# Patient Record
Sex: Female | Born: 1955 | Race: White | Hispanic: No | Marital: Married | Smoking: Former smoker
Health system: Southern US, Community
[De-identification: ages and names within clinical notes are randomized; demographics above are authoritative.]

## PROBLEM LIST (undated history)

## (undated) DIAGNOSIS — J45909 Unspecified asthma, uncomplicated: Secondary | ICD-10-CM

## (undated) DIAGNOSIS — I509 Heart failure, unspecified: Secondary | ICD-10-CM

## (undated) DIAGNOSIS — M549 Dorsalgia, unspecified: Secondary | ICD-10-CM

## (undated) DIAGNOSIS — J449 Chronic obstructive pulmonary disease, unspecified: Secondary | ICD-10-CM

## (undated) DIAGNOSIS — G8929 Other chronic pain: Secondary | ICD-10-CM

## (undated) DIAGNOSIS — I272 Pulmonary hypertension, unspecified: Secondary | ICD-10-CM

## (undated) HISTORY — PX: BACK SURGERY: SHX140

---

## 2000-04-26 ENCOUNTER — Ambulatory Visit: Admission: RE | Admit: 2000-04-26 | Discharge: 2000-04-26 | Payer: Self-pay | Admitting: Internal Medicine

## 2000-06-20 ENCOUNTER — Encounter: Payer: Self-pay | Admitting: Pulmonary Disease

## 2000-06-20 ENCOUNTER — Ambulatory Visit (HOSPITAL_COMMUNITY): Admission: RE | Admit: 2000-06-20 | Discharge: 2000-06-20 | Payer: Self-pay | Admitting: Pulmonary Disease

## 2000-06-22 ENCOUNTER — Ambulatory Visit (HOSPITAL_COMMUNITY): Admission: RE | Admit: 2000-06-22 | Discharge: 2000-06-22 | Payer: Self-pay | Admitting: Critical Care Medicine

## 2000-06-22 ENCOUNTER — Encounter: Payer: Self-pay | Admitting: Critical Care Medicine

## 2000-07-23 ENCOUNTER — Encounter: Payer: Self-pay | Admitting: Emergency Medicine

## 2000-07-23 ENCOUNTER — Emergency Department (HOSPITAL_COMMUNITY): Admission: EM | Admit: 2000-07-23 | Discharge: 2000-07-23 | Payer: Self-pay | Admitting: Emergency Medicine

## 2002-07-18 ENCOUNTER — Encounter (HOSPITAL_BASED_OUTPATIENT_CLINIC_OR_DEPARTMENT_OTHER): Admission: RE | Admit: 2002-07-18 | Discharge: 2002-10-23 | Payer: Self-pay | Admitting: Internal Medicine

## 2002-09-20 ENCOUNTER — Encounter (HOSPITAL_BASED_OUTPATIENT_CLINIC_OR_DEPARTMENT_OTHER): Admission: RE | Admit: 2002-09-20 | Discharge: 2002-12-19 | Payer: Self-pay | Admitting: Internal Medicine

## 2002-12-25 ENCOUNTER — Encounter (HOSPITAL_BASED_OUTPATIENT_CLINIC_OR_DEPARTMENT_OTHER): Admission: RE | Admit: 2002-12-25 | Discharge: 2003-03-25 | Payer: Self-pay | Admitting: Internal Medicine

## 2004-06-11 ENCOUNTER — Encounter: Admission: RE | Admit: 2004-06-11 | Discharge: 2004-06-11 | Payer: Self-pay | Admitting: Unknown Physician Specialty

## 2004-08-20 ENCOUNTER — Encounter: Admission: RE | Admit: 2004-08-20 | Discharge: 2004-08-20 | Payer: Self-pay | Admitting: Unknown Physician Specialty

## 2004-08-27 ENCOUNTER — Encounter: Admission: RE | Admit: 2004-08-27 | Discharge: 2004-08-27 | Payer: Self-pay | Admitting: Unknown Physician Specialty

## 2004-09-10 ENCOUNTER — Encounter: Admission: RE | Admit: 2004-09-10 | Discharge: 2004-09-10 | Payer: Self-pay | Admitting: Unknown Physician Specialty

## 2004-09-17 ENCOUNTER — Encounter: Admission: RE | Admit: 2004-09-17 | Discharge: 2004-09-17 | Payer: Self-pay | Admitting: Unknown Physician Specialty

## 2004-10-08 ENCOUNTER — Encounter: Admission: RE | Admit: 2004-10-08 | Discharge: 2004-10-08 | Payer: Self-pay | Admitting: Unknown Physician Specialty

## 2004-12-22 ENCOUNTER — Encounter: Admission: RE | Admit: 2004-12-22 | Discharge: 2004-12-22 | Payer: Self-pay | Admitting: Unknown Physician Specialty

## 2005-02-09 ENCOUNTER — Encounter: Admission: RE | Admit: 2005-02-09 | Discharge: 2005-02-09 | Payer: Self-pay | Admitting: Unknown Physician Specialty

## 2005-02-18 ENCOUNTER — Encounter: Admission: RE | Admit: 2005-02-18 | Discharge: 2005-02-18 | Payer: Self-pay | Admitting: Unknown Physician Specialty

## 2005-03-18 ENCOUNTER — Encounter: Admission: RE | Admit: 2005-03-18 | Discharge: 2005-03-18 | Payer: Self-pay | Admitting: Unknown Physician Specialty

## 2005-03-25 ENCOUNTER — Encounter: Admission: RE | Admit: 2005-03-25 | Discharge: 2005-03-25 | Payer: Self-pay | Admitting: Unknown Physician Specialty

## 2005-04-15 ENCOUNTER — Encounter: Admission: RE | Admit: 2005-04-15 | Discharge: 2005-04-15 | Payer: Self-pay | Admitting: Interventional Radiology

## 2005-09-16 ENCOUNTER — Encounter: Admission: RE | Admit: 2005-09-16 | Discharge: 2005-09-16 | Payer: Self-pay | Admitting: Unknown Physician Specialty

## 2006-09-01 IMAGING — US EM EST PATIENT OFFICE LEVEL 3 (15 MIN)
1 series · 13 of 16 positions shown · non-contrast
Comparison: none

CLINICAL DATA: One week follow-up transcatheter occlusion of the right great saphenous vein.
 ULTRASOUND ESTABLISHED
 The patient returns today one week following endovenous laser ablation of the right great saphenous vein.  The patient is doing well.  The patient describes only minimal discomfort over the weekend, but that has resolved at this point.  
 On physical exam, there is slight bruising noted in the medial right thigh particularly inferiorly.  
 On ultrasound, the treated right great saphenous vein from near the saphenofemoral junction to the lower thigh is occluded.  The duplicated or anterolateral saphenous vein which arose very proximal from the main right great saphenous vein remains patent.  The main right great saphenous vein is occluded below the inflow from this duplicated saphenous just at the saphenofemoral junction.  
 The deep venous system is widely patent without evidence of DVT.  
 IMPRESSION
 1.  One week follow-up ultrasound visit following transcatheter occlusion of the right great saphenous vein shows that the treated right great saphenous vein is occluded from just below the inflow of the duplicated right great saphenous vein down into the distal thigh at the access site.  No evidence of DVT.  
 2.  We will see the patient back in two weeks and plan on treating the duplicated or anterolateral saphenous vein at that time.

[Series 1: unknown · 13 of 23 slices shown]
[im 1/23]
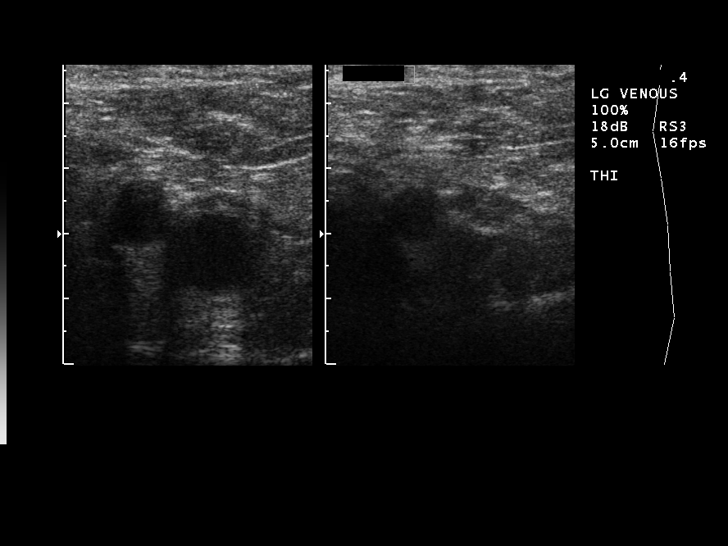
[im 2/23]
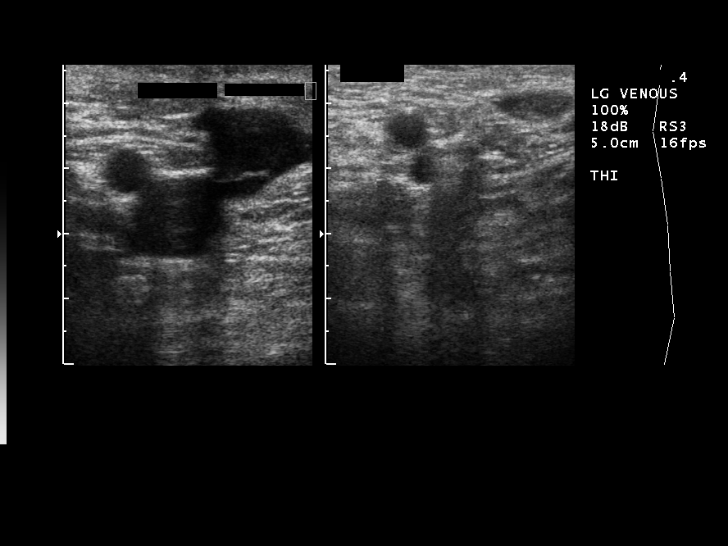
[im 5/23]
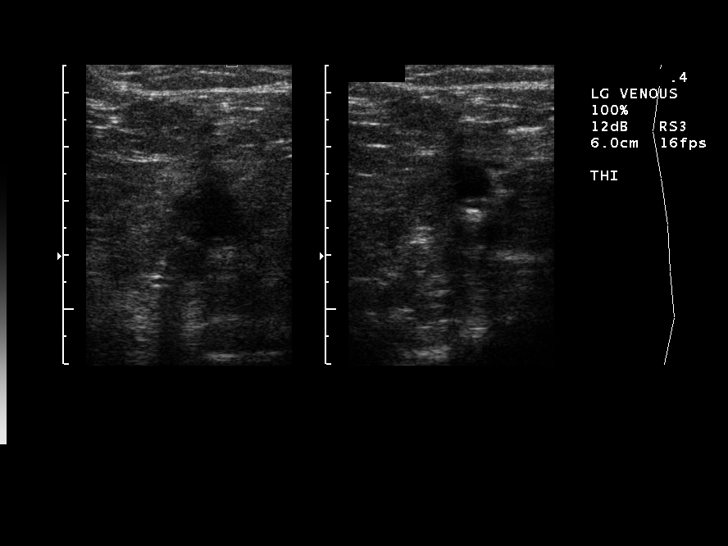
[im 6/23]
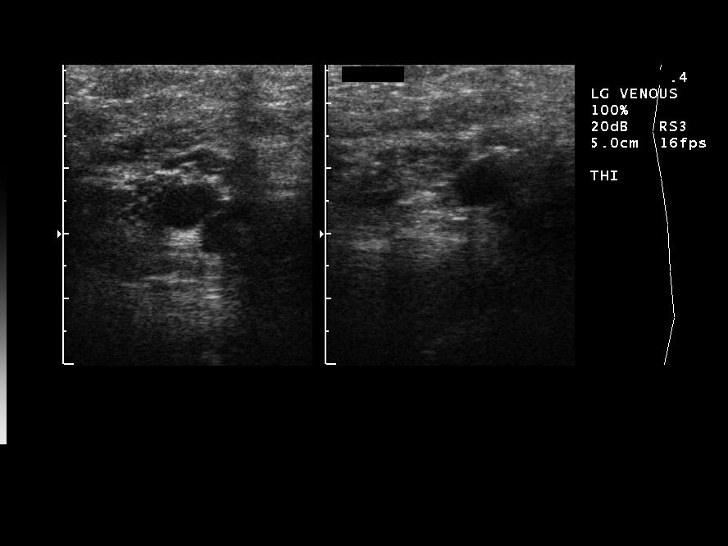
[im 8/23]
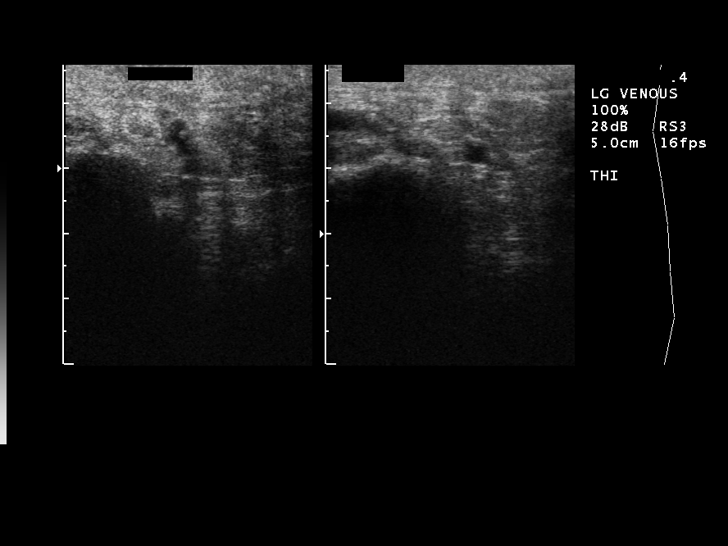
[im 9/23]
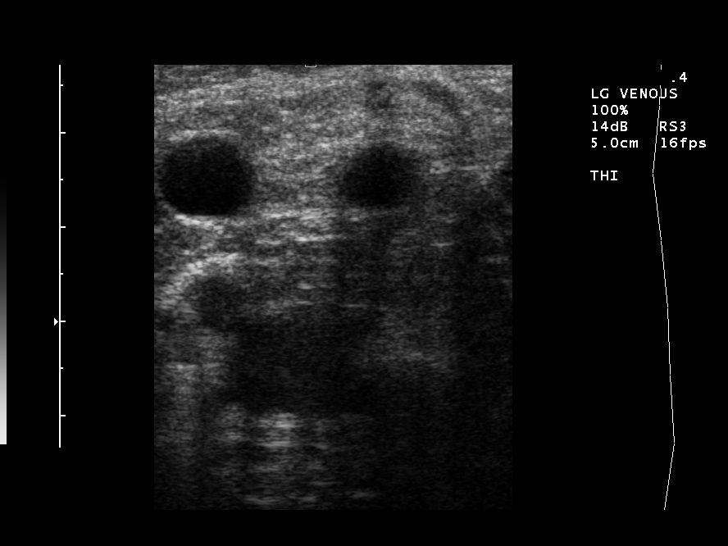
[im 12/23]
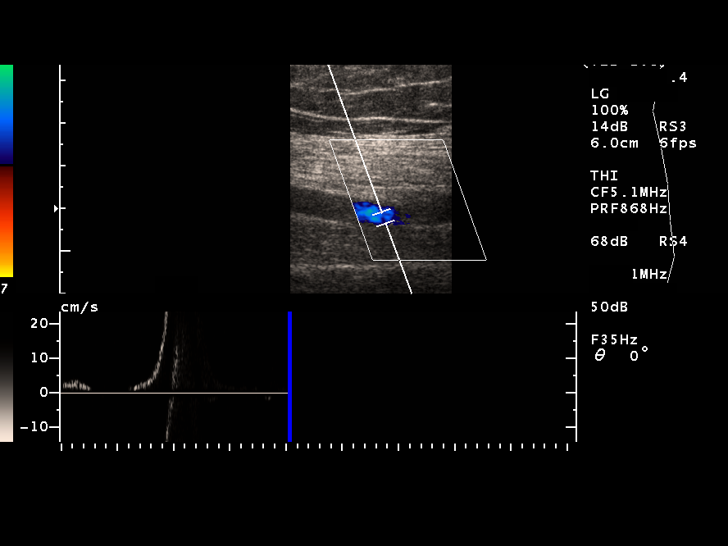
[im 14/23]
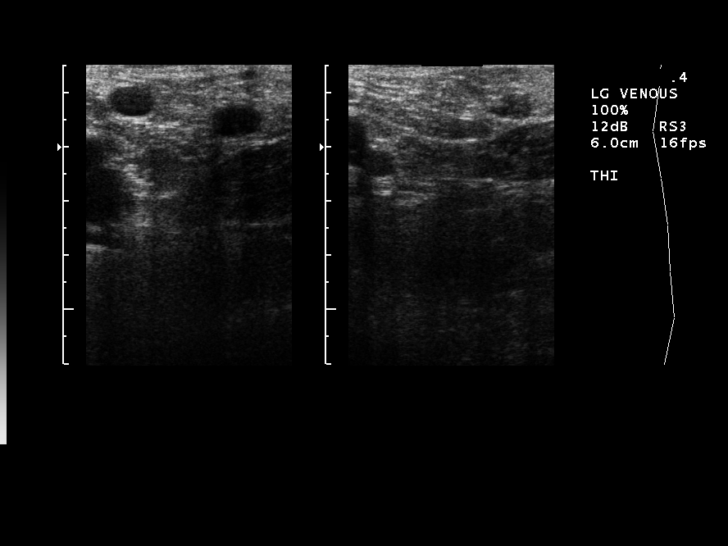
[im 15/23]
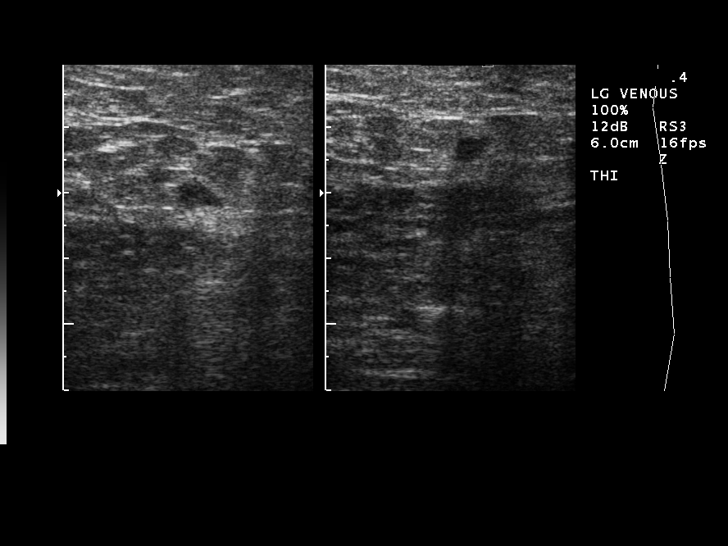
[im 17/23]
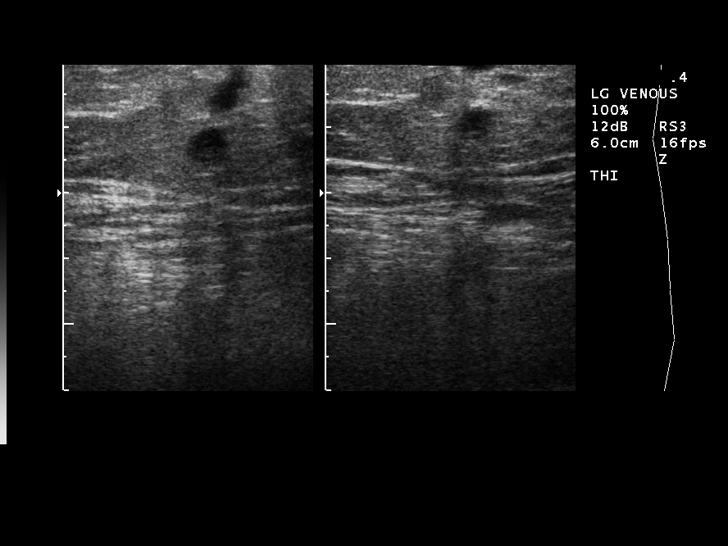
[im 18/23]
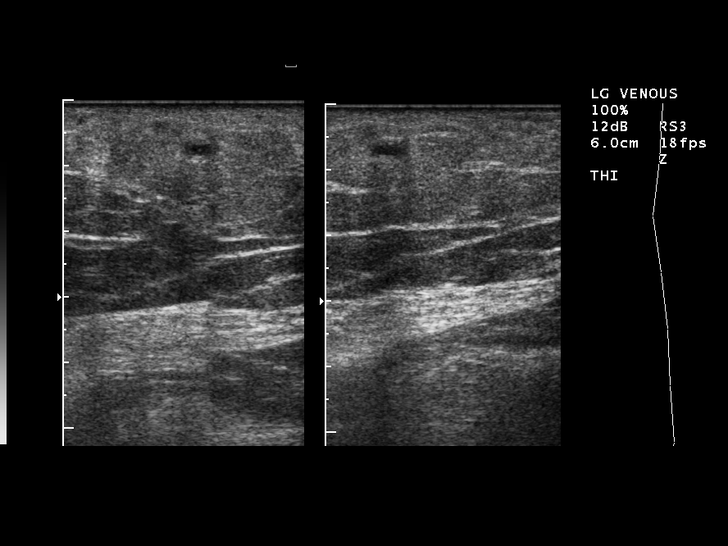
[im 21/23]
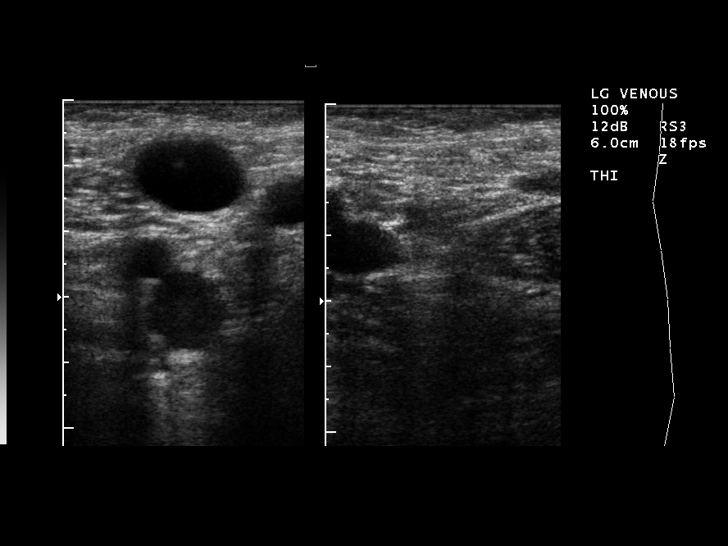
[im 23/23]
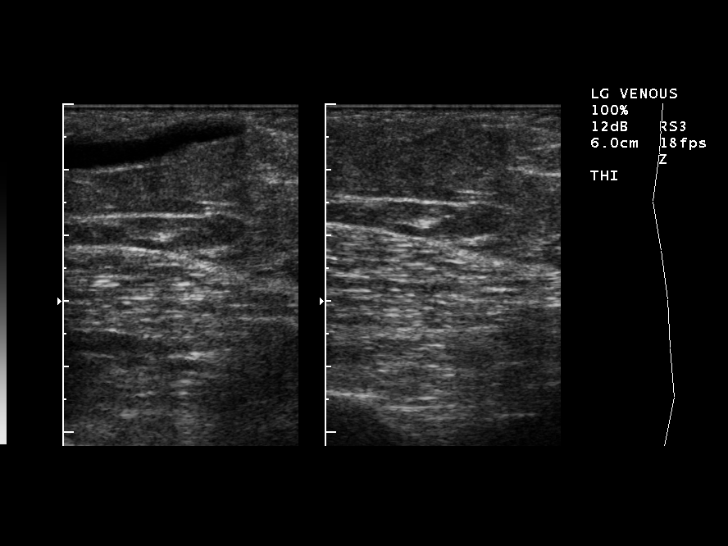

[13 of 16 positions shown; findings below may reference images not displayed]

## 2006-10-23 ENCOUNTER — Emergency Department: Payer: Self-pay | Admitting: Emergency Medicine

## 2006-10-23 ENCOUNTER — Other Ambulatory Visit: Payer: Self-pay

## 2007-02-23 IMAGING — US EM EST PATIENT OFFICE LEVEL 3 (15 MIN)
1 series · 13 of 16 positions shown · non-contrast
Comparison: none

CLINICAL DATA: Varicose veins.
ULTRASOUND ESTABLISHED PATIENT OFFICE VISIT ? LEVEL III ? [id number]:
Today, Ms. Tebohotebele returns for a one week follow-up exam after left great saphenous transcatheter laser occlusion on 02/09/05.   Today, she reports some mild redness and tenderness along the proximal medial calf which overlies the treated segment. 
On exam, this area demonstrates an oblique area of redness and tenderness overlying the treated segment consistent with thrombophlebitis.  The area has improved over the last few days and has not progressed or worsened. 
On ultrasound, the left common femoral, femoral and popliteal veins demonstrate normal compressibility and augmentation without DVT.  The left great saphenous vein remains patent proximally along a segment of about 8 to 10 cm from the saphenofemoral junction extending inferiorly.  In the mid to upper thigh level, there is a varicose tributary branch extending anterolaterally across the thigh and knee.  This pathway remains patent.  Below the tributary junction, the saphenous vein is echogenic and noncompressible with complete thrombosis related to the treatment which extends through the thigh across the knee and into the upper calf region.  At the site of mild thrombophlebitis, the overlying skin is intact.  Mild pinkness to the skin persists in the anterior pretibial region which is a chronic finding with mild induration.  There is also eczema about the ankle.  All of these changes are related to chronic venous disease and are unchanged.

[Series 1: unknown · 13 of 23 slices shown]
[im 1/23]
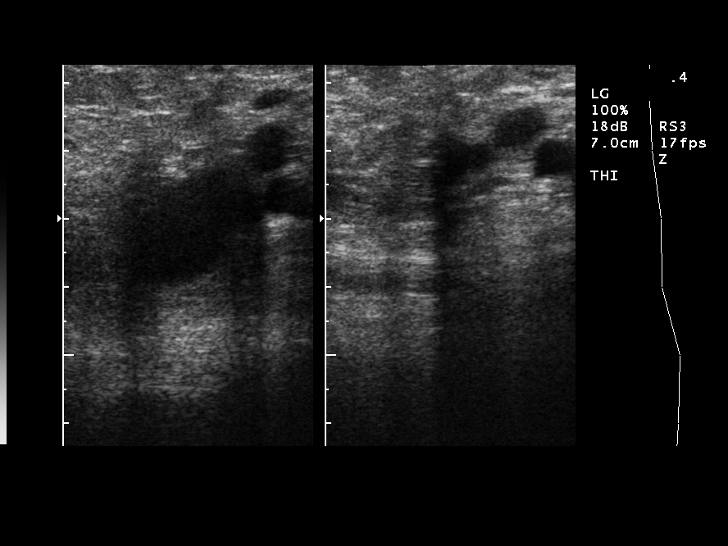
[im 2/23]
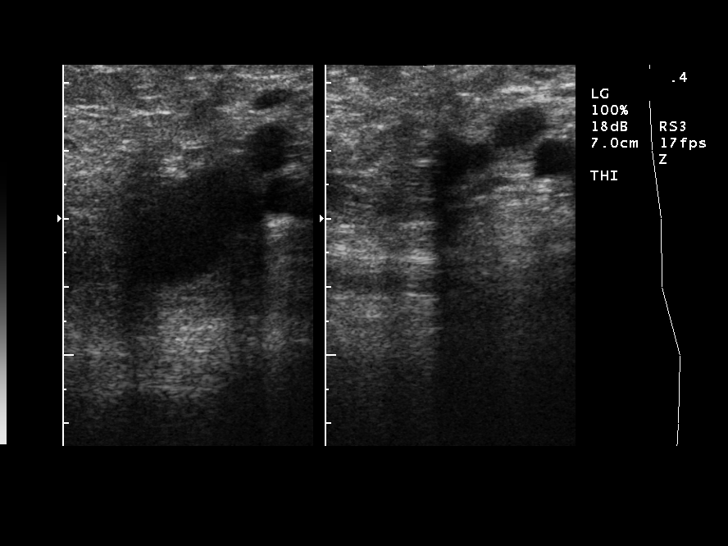
[im 5/23]
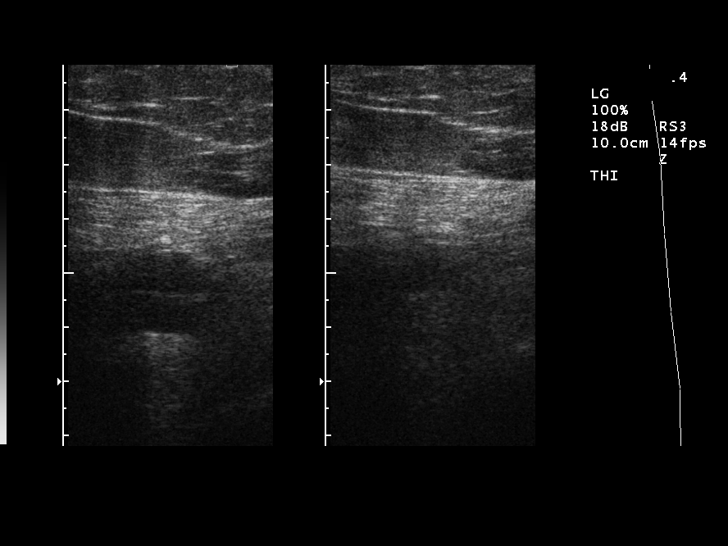
[im 6/23]
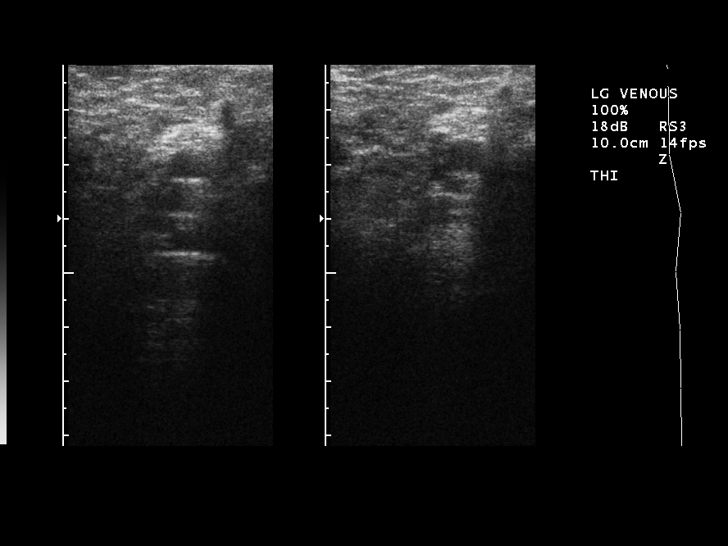
[im 8/23]
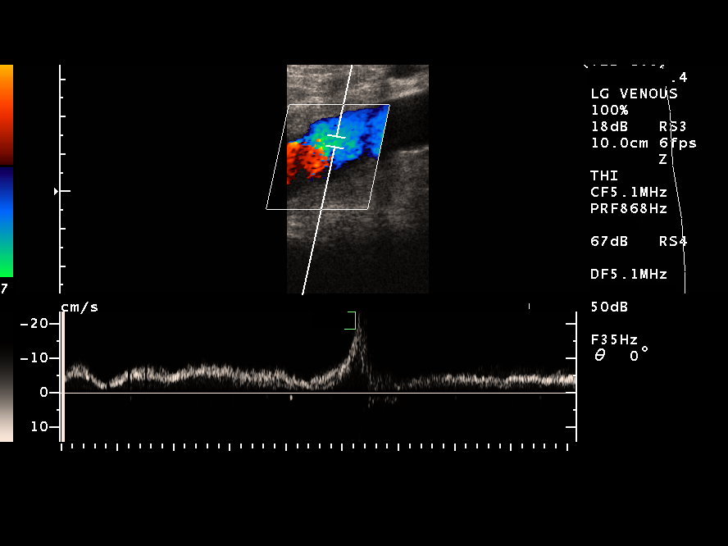
[im 9/23]
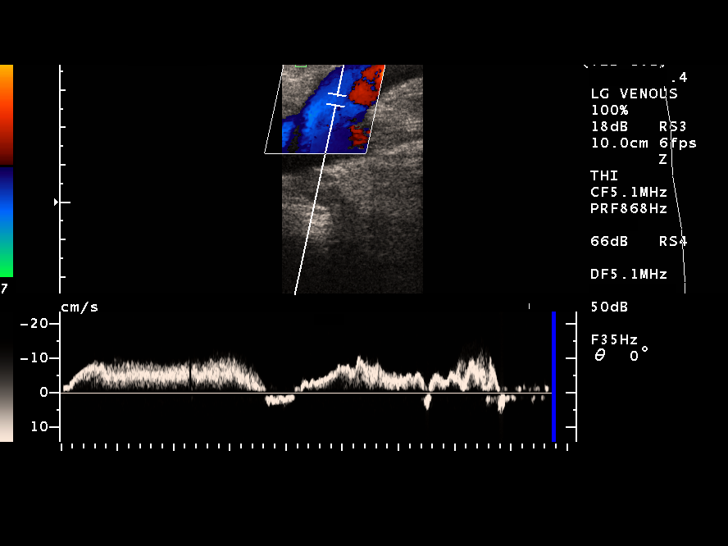
[im 12/23]
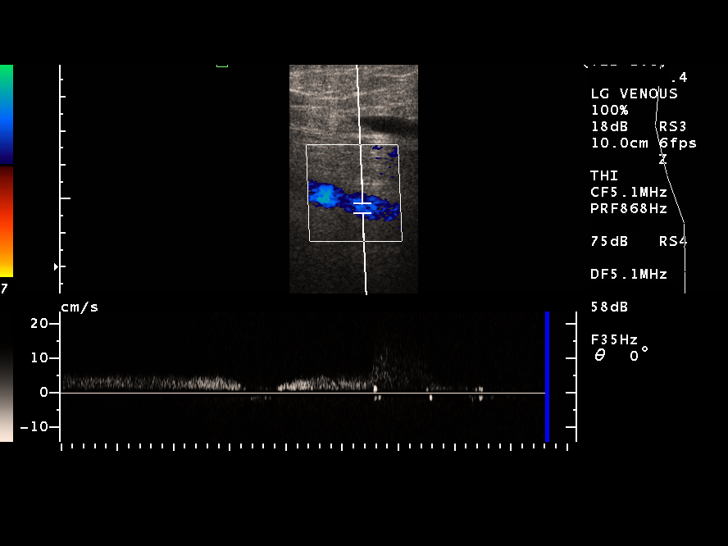
[im 14/23]
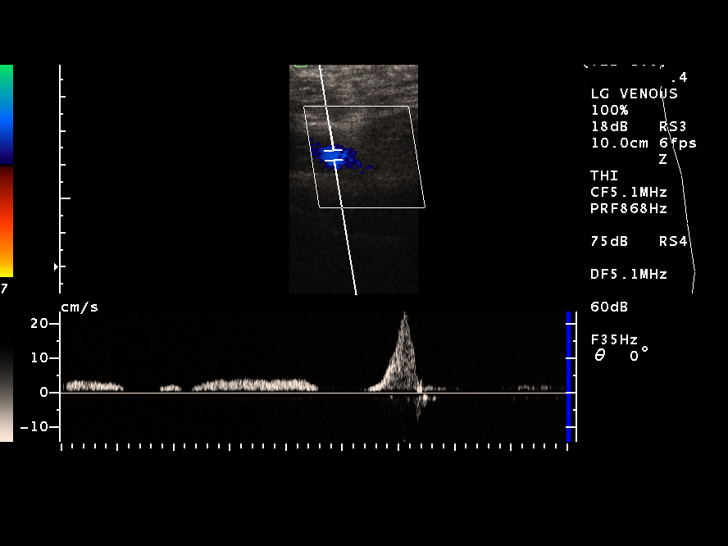
[im 15/23]
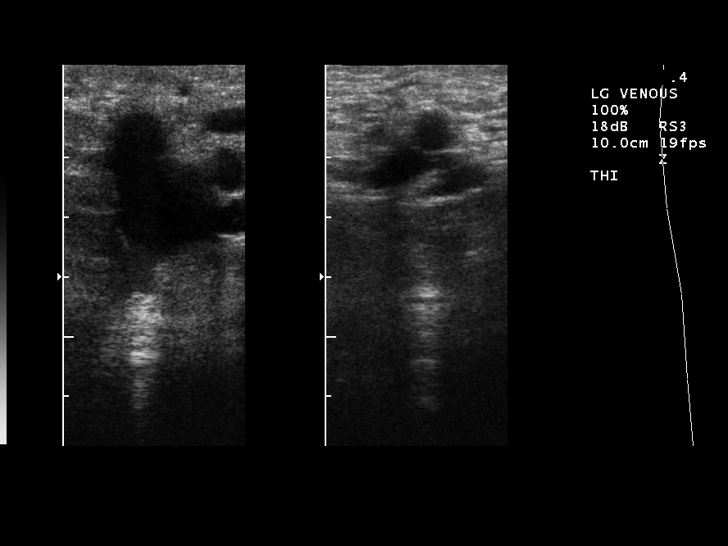
[im 17/23]
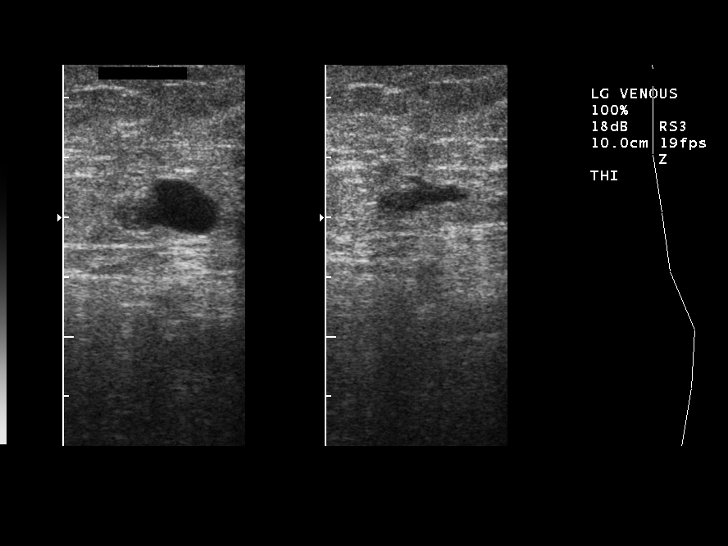
[im 18/23]
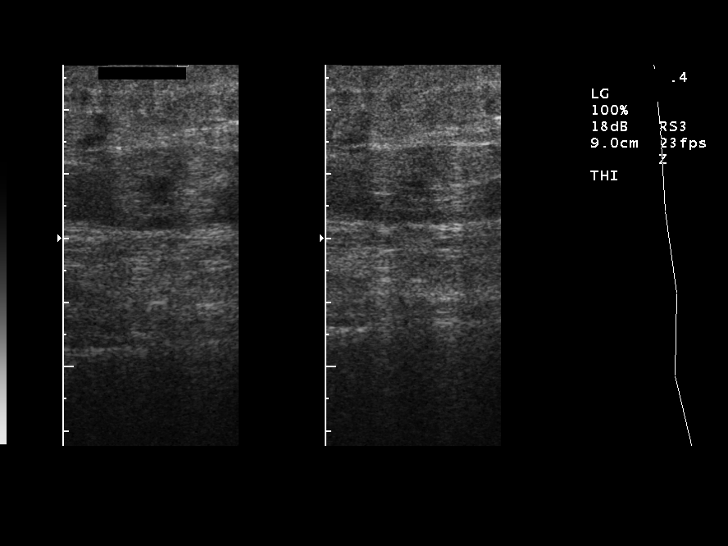
[im 21/23]
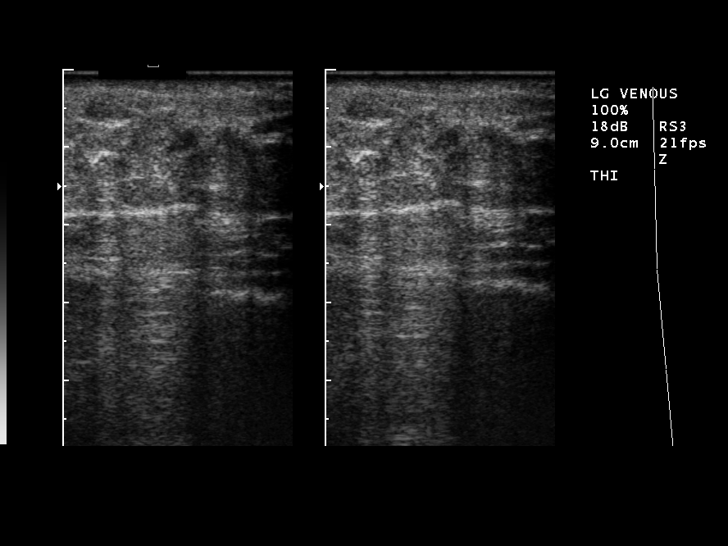
[im 23/23]
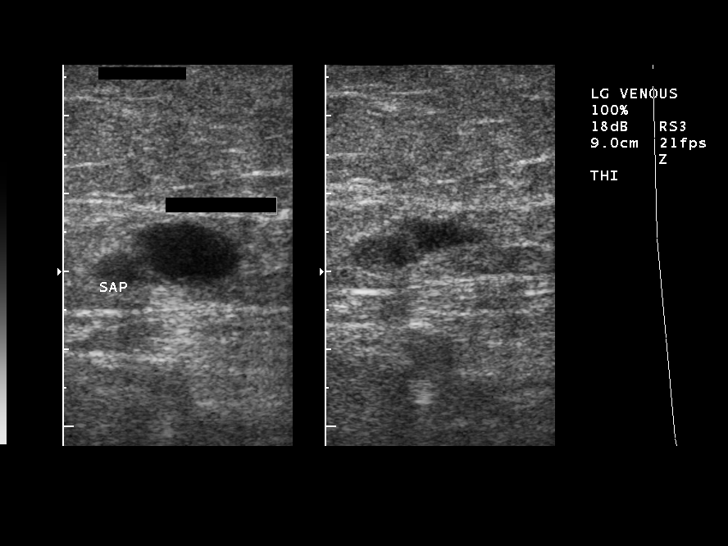

[13 of 16 positions shown; findings below may reference images not displayed]

IMPRESSION: 1.  Status post left great saphenous transcatheter laser occlusion with occlusion of the saphenous vein from the upper to mid thigh level into the calf region.  A short segment proximally from the saphenofemoral junction to the upper thigh remains patent which supplies an anterolateral tributary varicosity.  This pathway will require a second endovenous laser treatment at the one month follow-up visit.  At which time, additional injection foam sclerotherapy will be performed. 
2.  Improving thrombophlebitis along the calf region. 
3.  Stable chronic venous changes of the lower calf and ankle.

## 2008-07-12 ENCOUNTER — Ambulatory Visit: Payer: Self-pay | Admitting: Unknown Physician Specialty

## 2008-07-18 ENCOUNTER — Ambulatory Visit: Payer: Self-pay | Admitting: Unknown Physician Specialty

## 2009-01-22 ENCOUNTER — Encounter: Payer: Self-pay | Admitting: Internal Medicine

## 2009-02-06 ENCOUNTER — Encounter: Payer: Self-pay | Admitting: Internal Medicine

## 2010-09-21 DIAGNOSIS — I878 Other specified disorders of veins: Secondary | ICD-10-CM | POA: Insufficient documentation

## 2010-09-21 DIAGNOSIS — G8929 Other chronic pain: Secondary | ICD-10-CM | POA: Insufficient documentation

## 2010-09-21 DIAGNOSIS — I10 Essential (primary) hypertension: Secondary | ICD-10-CM | POA: Insufficient documentation

## 2010-11-28 ENCOUNTER — Encounter: Payer: Self-pay | Admitting: Unknown Physician Specialty

## 2010-12-12 ENCOUNTER — Ambulatory Visit: Payer: Self-pay | Admitting: Family Medicine

## 2011-03-26 NOTE — Consult Note (Signed)
NAME:  Regina White, Regina White                      ACCOUNT NO.:  [id number]   MEDICAL RECORD NO.:  [id number]                   PATIENT TYPE:  REC   LOCATION:  FOOT                                 FACILITY:  [facility]   PHYSICIAN:  Jonelle Sports. Sevier, MD                DATE OF BIRTH:  1956-08-14   DATE OF CONSULTATION:  07/19/2002  DATE OF DISCHARGE:                                   CONSULTATION   HISTORY:  This 55 year old white female is referred by Dr. Katrinka Blazing for  assistance with management of a chronic ulcer on the right lower extremity.   The patient has a complex medical history centered around complex back  problems which have resulted in a total of 14 different surgeries and she  now, in fact, uses a morphine pump for chronic unrelieved back pain.  She  has a tendency toward edema presumed secondary to varicose veins, but she  says in some way seemingly exaggerated by her use of the morphine pump.  She  has had recurrent ulcers of the lower extremities for some six years, but  none recently until approximately nine months ago when she developed a large  nonhealing ulcer on the medial aspect of the right ankle.  She initially had  used an Radio broadcast assistant which caused extreme compression and bruising and required  its discontinuation.  She has been using Silvadene cream and an Ace wrap on  this ulcer recently without relief.   She is here now for our further evaluation and advice.   PAST MEDICAL HISTORY:  Really no medical problems other than those  previously mentioned.   ALLERGIES:  PENICILLIN, ERYTHROMYCIN, AMPICILLIN, and SULFA.   REGULAR MEDICATIONS:  Nexium, accolade, Demadex, morphine, Klor-Con, Zyrtec,  Cytomel, metoclopramide, Maxair, Advair inhalers, currently Avelox for sinus  infection.   PHYSICAL EXAMINATION:  EXTREMITIES:  Examination today is limited to the  distal lower extremities.  The feet are mild to moderately edematous, the  right slightly more so than the left.   Skin temperatures are normal, but are  several degrees higher in the left lower extremity in comparison to the  right (the patient attributes this to previous sympathectomy).  All pulses  are essentially palpable and appear adequate.  Monofilament testing shows  some loss of sensation over the metatarsal heads, but otherwise persistence  of protective sensation.  There are no open lesions or significant calluses  on the feet themselves.  In the right supramalleolar area on the medial  aspect of that extremity is a punched out open ulceration measuring 25 x 15  mm with shaggy base, crusty margins, and approximately 2 mm in depth.  There  is surrounding erythema which looks more inflammatory than infectious in  nature involving most of that ankle and the distal third of the lower leg.   IMPRESSION:  Chronic venous stasis ulceration.   DISPOSITION:  1. The matter is discussed with the  patient in some detail in an effort to     get her to tolerate compressive dressings again.  2. The wound today is treated with Accuzyme ointment and 0.1% triamcinolone     cream is applied to the adjacent inflamed periwound areas.  The area is     then covered with a light Profour dressing using only three of the four     Profour wraps.  3.     The patient will be scheduled for pulse lavage tomorrow (Friday) as well as      next Monday and Wednesday with replacement of Accuzyme and similar     dressing after each lavage treatment.  4. A follow-up visit will be to this clinic next Wednesday, six days hence.                                               Jonelle Sports. Cheryll Cockayne, MD    RES/MEDQ  D:  07/19/2002  T:  07/19/2002  Job:  [id number]   cc:   Roland Earl

## 2012-08-14 ENCOUNTER — Ambulatory Visit: Payer: Self-pay | Admitting: Internal Medicine

## 2013-06-14 DIAGNOSIS — I272 Pulmonary hypertension, unspecified: Secondary | ICD-10-CM | POA: Insufficient documentation

## 2013-06-14 DIAGNOSIS — I82409 Acute embolism and thrombosis of unspecified deep veins of unspecified lower extremity: Secondary | ICD-10-CM | POA: Insufficient documentation

## 2013-06-18 DIAGNOSIS — R590 Localized enlarged lymph nodes: Secondary | ICD-10-CM | POA: Insufficient documentation

## 2013-06-18 DIAGNOSIS — L539 Erythematous condition, unspecified: Secondary | ICD-10-CM | POA: Insufficient documentation

## 2013-06-18 DIAGNOSIS — M79605 Pain in left leg: Secondary | ICD-10-CM | POA: Insufficient documentation

## 2013-06-25 DIAGNOSIS — I82409 Acute embolism and thrombosis of unspecified deep veins of unspecified lower extremity: Secondary | ICD-10-CM | POA: Insufficient documentation

## 2013-07-25 DIAGNOSIS — G4733 Obstructive sleep apnea (adult) (pediatric): Secondary | ICD-10-CM | POA: Insufficient documentation

## 2013-09-24 ENCOUNTER — Ambulatory Visit: Payer: Self-pay | Admitting: Podiatry

## 2013-09-25 DIAGNOSIS — M9979 Connective tissue and disc stenosis of intervertebral foramina of abdomen and other regions: Secondary | ICD-10-CM | POA: Insufficient documentation

## 2013-09-25 DIAGNOSIS — IMO0002 Reserved for concepts with insufficient information to code with codable children: Secondary | ICD-10-CM | POA: Insufficient documentation

## 2015-09-05 ENCOUNTER — Ambulatory Visit
Admission: RE | Admit: 2015-09-05 | Discharge: 2015-09-05 | Disposition: A | Discharge status: Home / Self Care | Payer: 59 | Source: Ambulatory Visit | Attending: Emergency Medicine | Admitting: Emergency Medicine

## 2015-09-05 ENCOUNTER — Other Ambulatory Visit: Payer: Self-pay | Admitting: Emergency Medicine

## 2015-09-05 DIAGNOSIS — M2548 Effusion, other site: Secondary | ICD-10-CM | POA: Insufficient documentation

## 2015-09-05 DIAGNOSIS — M79661 Pain in right lower leg: Secondary | ICD-10-CM

## 2015-09-05 DIAGNOSIS — M25469 Effusion, unspecified knee: Secondary | ICD-10-CM

## 2015-12-23 DIAGNOSIS — I5030 Unspecified diastolic (congestive) heart failure: Secondary | ICD-10-CM | POA: Insufficient documentation

## 2015-12-23 DIAGNOSIS — J449 Chronic obstructive pulmonary disease, unspecified: Secondary | ICD-10-CM | POA: Insufficient documentation

## 2016-02-17 DIAGNOSIS — R197 Diarrhea, unspecified: Secondary | ICD-10-CM | POA: Insufficient documentation

## 2016-02-17 DIAGNOSIS — H6501 Acute serous otitis media, right ear: Secondary | ICD-10-CM | POA: Insufficient documentation

## 2016-10-06 ENCOUNTER — Ambulatory Visit (INDEPENDENT_AMBULATORY_CARE_PROVIDER_SITE_OTHER): Payer: 59

## 2016-10-06 ENCOUNTER — Ambulatory Visit (INDEPENDENT_AMBULATORY_CARE_PROVIDER_SITE_OTHER): Payer: 59 | Admitting: Podiatry

## 2016-10-06 ENCOUNTER — Encounter: Payer: Self-pay | Admitting: Podiatry

## 2016-10-06 DIAGNOSIS — M71571 Other bursitis, not elsewhere classified, right ankle and foot: Secondary | ICD-10-CM

## 2016-10-06 DIAGNOSIS — M2041 Other hammer toe(s) (acquired), right foot: Secondary | ICD-10-CM | POA: Diagnosis not present

## 2016-10-06 DIAGNOSIS — M7751 Other enthesopathy of right foot: Secondary | ICD-10-CM

## 2016-10-06 NOTE — Progress Notes (Signed)
   Subjective:    Patient ID: Regina White, female    DOB: 1955/12/03, 60 y.o.   MRN: [id number]  HPI: She presents today with chief complaint of painful lesion lateral aspect fifth metatarsal base right foot. States that she purchased a new para shoes which has resulted in this ulceration and sore.  Review of Systems  Respiratory: Positive for cough and wheezing.   Gastrointestinal: Positive for constipation.  Musculoskeletal: Positive for arthralgias, back pain, gait problem and myalgias.  All other systems reviewed and are negative.      Objective:   Physical Exam: Vital signs are stable alert and oriented 3. Pulses barely palpable bilateral. Venous insufficiency is present with edema bilateral.. Neurologic sensory was intact. Deep tendon reflexes are intact muscle strength is palpable and present +4 out of 5. Orthopedic evaluation demonstrates DISTAL to the angle full range of motion or crepitus. She has a reactive hyperkeratosis lateral aspect and plantar lateral aspect of the fifth metatarsal base of the right foot associated with hypertrophy of the bone from a previous fracture. Bursitis is present.     Assessment & Plan:  Peripheral vascular disease ulceration fifth metatarsal base of the right foot. Bursitis  Plan: Debrided this area today placed padding injected the bursa with 2 mg of dexamethasone.

## 2017-01-25 ENCOUNTER — Encounter: Payer: Self-pay | Admitting: Podiatry

## 2017-01-25 ENCOUNTER — Ambulatory Visit (INDEPENDENT_AMBULATORY_CARE_PROVIDER_SITE_OTHER): Payer: 59 | Admitting: Podiatry

## 2017-01-25 DIAGNOSIS — L03039 Cellulitis of unspecified toe: Secondary | ICD-10-CM | POA: Diagnosis not present

## 2017-01-25 DIAGNOSIS — L600 Ingrowing nail: Secondary | ICD-10-CM | POA: Diagnosis not present

## 2017-01-25 DIAGNOSIS — M79676 Pain in unspecified toe(s): Secondary | ICD-10-CM | POA: Diagnosis not present

## 2017-01-25 MED ORDER — BACITRACIN 500 UNIT/GM EX OINT: 1.0000 "application " | TOPICAL_OINTMENT | Freq: Two times a day (BID) | CUTANEOUS | 1 refills | Status: DC

## 2017-01-25 MED ORDER — DOXYCYCLINE HYCLATE 100 MG PO TABS: 100.0000 mg | ORAL_TABLET | Freq: Two times a day (BID) | ORAL | 0 refills | Status: DC

## 2017-01-25 NOTE — Patient Instructions (Signed)

## 2017-01-25 NOTE — Progress Notes (Signed)
   Subjective: Patient presents today for evaluation of pain in toe(s). Patient is concerned for possible ingrown nail. Patient states that the pain has been present for a few weeks now. Patient presents today for further treatment and evaluation.  Objective:  General: Well developed, nourished, in no acute distress, alert and oriented x3   Dermatology: Skin is warm, dry and supple bilateral. Medial and lateral borders the left great toe appears to be erythematous with evidence of an ingrowing nail. Purulent drainage noted with intruding nail into the respective nail fold. Pain on palpation noted to the border of the nail fold. The remaining nails appear unremarkable at this time. There are no open sores, lesions.  Vascular: Dorsalis Pedis artery and Posterior Tibial artery pedal pulses palpable. No lower extremity edema noted.   Neruologic: Grossly intact via light touch bilateral.  Musculoskeletal: Muscular strength within normal limits in all groups bilateral. Normal range of motion noted to all pedal and ankle joints.   Assesement: #1 Paronychia with ingrowing nail medial and lateral borders of left great toe #2 Pain in toe #3 Incurvated nail  Plan of Care:  1. Patient evaluated.  2. Discussed treatment alternatives and plan of care. Explained nail avulsion procedure and post procedure course to patient. 3. Patient opted for permanent partial nail avulsion.  4. Prior to procedure, local anesthesia infiltration utilized using 3 ml of a 50:50 mixture of 2% plain lidocaine and 0.5% plain marcaine in a normal hallux block fashion and a betadine prep performed.  5. Partial permanent nail avulsion with chemical matrixectomy performed using 3x30sec applications of phenol followed by alcohol flush.  6. Light dressing applied. 7. Prescription for doxycycline 100 mg  8. Return to clinic in 2 weeks.   Felecia ShellingBrent M. Evans, DPM [facility]  Dr. Felecia ShellingBrent M. Evans, DPM    [address]                                        [place], [place] [address]                Office [phone]  Fax [phone]

## 2017-01-25 NOTE — Addendum Note (Signed)
Addended by: Geraldine ContrasVENABLE, ANGELA D on: 01/25/2017 09:06 AM   Modules accepted: Orders

## 2017-02-08 ENCOUNTER — Ambulatory Visit (INDEPENDENT_AMBULATORY_CARE_PROVIDER_SITE_OTHER): Payer: 59 | Admitting: Podiatry

## 2017-02-08 DIAGNOSIS — S91209D Unspecified open wound of unspecified toe(s) with damage to nail, subsequent encounter: Secondary | ICD-10-CM

## 2017-02-08 DIAGNOSIS — M79676 Pain in unspecified toe(s): Secondary | ICD-10-CM | POA: Diagnosis not present

## 2017-02-08 DIAGNOSIS — S91109D Unspecified open wound of unspecified toe(s) without damage to nail, subsequent encounter: Secondary | ICD-10-CM | POA: Diagnosis not present

## 2017-02-08 MED ORDER — GENTAMICIN SULFATE 0.1 % EX CREA: 1.0000 "application " | TOPICAL_CREAM | Freq: Three times a day (TID) | CUTANEOUS | 0 refills | Status: DC

## 2017-02-08 NOTE — Progress Notes (Signed)
   Subjective:  Patient presents today status post partial permanent nail avulsion to the medial and lateral borders left great toe. Patient states that she was doing well for a few days after the procedure however she started to develop nausea and vomiting with an altered mental status. Patient went to the urgent care on 02/05/2017 where they placed her on clindamycin and Zofran. Patient discontinued the doxycycline which was prescribed at last visit. Patient continues to fill nausea and vomiting with lapses in the mental status.    Objective/Physical Exam General: The patient is alert and oriented x3 in no acute distress.  Dermatology: There does appear to be some localized cellulitis to the left great toe with serosanguineous drainage noted to the partial permanent nail avulsion sites. Periwound integrity is mildly macerated due to drainage. There is some red discoloration likely secondary to phenol chemical application on last visit.  Vascular: Palpable pedal pulses bilaterally. No edema or erythema noted. Capillary refill within normal limits.  Neurological: Epicritic and protective threshold grossly intact bilaterally.   Musculoskeletal Exam: Range of motion within normal limits to all pedal and ankle joints bilateral. Muscle strength 5/5 in all groups bilateral.   Assessment: #1 status post partial permanent nail avulsion medial and lateral borders left great toe. #2 constipation 7 days #3 nausea and vomiting #4 episodes of altered mental status   Plan of Care:  #1 Patient was evaluated. #2 debridement of the toenail avulsion sites was performed with application of antibiotic cream and a Band-Aid. #3 prescription for gentamicin cream #4 continue clindamycin as prescribed at the urgent care. Discontinue doxycycline #5 instructed the patient to follow-up with her primary care physician today or present to the emergency Department for further workup and treatment and evaluation.  There is a high likelihood that her symptoms are not coming from her left great toe nail avulsion sites. There is possible concerned about obstruction, however she is on a constant morphine drip. #6 return to clinic in 2 weeks for follow-up treatment and evaluation of left great toe cellulitis.   Felecia Shelling, DPM [facility]  Dr. Felecia Shelling, DPM    [address]                                        [place], [place] [address]                Office [phone]  Fax [phone]

## 2017-02-22 ENCOUNTER — Ambulatory Visit: Payer: 59 | Admitting: Podiatry

## 2018-05-01 ENCOUNTER — Other Ambulatory Visit: Payer: Self-pay

## 2018-05-01 ENCOUNTER — Encounter: Payer: 59 | Attending: Internal Medicine

## 2018-05-01 VITALS — Ht 60.1 in | Wt 232.0 lb

## 2018-05-01 DIAGNOSIS — Z79899 Other long term (current) drug therapy: Secondary | ICD-10-CM | POA: Insufficient documentation

## 2018-05-01 DIAGNOSIS — I272 Pulmonary hypertension, unspecified: Secondary | ICD-10-CM | POA: Diagnosis not present

## 2018-05-01 DIAGNOSIS — Z87891 Personal history of nicotine dependence: Secondary | ICD-10-CM | POA: Diagnosis not present

## 2018-05-01 NOTE — Progress Notes (Addendum)
Regina White is unable to walk due to chronic back pain.  She arrived on a scooter and has limited ambulating even at home.  She has had 14 back surgeries and arthritis in hips, knees, and ankles.  Her legs were very tight from fluid overload.  Since she was not able to walk, we decided to do a seated NuStep test for .  After transferring from chair to stepper and sitting for a minute to answer our baseline questions, her saturations were 84%.  She then went to meet with RT for medical review to see if saturations would recover as she was asymptomatic and does not wear oxygen at home.  Upon, return to gym she ambulated from scooter to scale, to stadiometer, to stepper and then we checked her saturations and she was 71%!  She rested for about 3-4 min and was able to recover to 91%.  We performed her test and her saturations only dropped to 83%.  We have documented this in her chart and will pass on the information to her pulmonologist as she may need home oxygen therapy.  She was adamant against wearing home oxygen but was willing to wear it in rehab if necessary. We will have her exercise on seated equipment with minimal movement between stations.  We have tried to keep her equipment close together.   6 Minute Walk    Row Name 05/01/18 1617         6 Minute Walk   Phase  Initial NuStep Test     Distance  419 feet steps     Walk Time  6 minutes     # of Rest Breaks  0     METS  1.3     RPE  13     Perceived Dyspnea   2     Symptoms  No     Resting HR  78 bpm     Resting BP  126/64     Resting Oxygen Saturation   91 % initial resting was 84%      Exercise Oxygen Saturation  during 6 min walk  83 %     Max Ex. HR  99 bpm     Max Ex. BP  138/70     2 Minute Post BP  130/68       Interval HR   1 Minute HR  77     2 Minute HR  86     3 Minute HR  83     4 Minute HR  90     5 Minute HR  99     6 Minute HR  96     2 Minute Post HR  77     Interval Heart Rate?  Yes       Interval Oxygen   Interval Oxygen?  Yes  (Significant)  Sats after ambulating to and from scale was 71%, recovered to 91% after 3 min     Baseline Oxygen Saturation %  91 %     1 Minute Oxygen Saturation %  92 %     1 Minute Liters of Oxygen  0 L Room Air     2 Minute Oxygen Saturation %  94 %     2 Minute Liters of Oxygen  0 L     3 Minute Oxygen Saturation %  89 %     3 Minute Liters of Oxygen  0 L     4 Minute Oxygen Saturation %  84 %     4 Minute Liters of Oxygen  0 L     5 Minute Oxygen Saturation %  86 %     5 Minute Liters of Oxygen  0 L     6 Minute Oxygen Saturation %  83 %     6 Minute Liters of Oxygen  0 L     2 Minute Post Oxygen Saturation %  93 %     2 Minute Post Liters of Oxygen  0 L       Fabio PierceJessica Matraca Hunkins, MA, ACSM [place]CE[place], [facility] Elizabeth[facility]CRP 05/01/2018 4:28 PM

## 2018-05-01 NOTE — Progress Notes (Signed)
Pulmonary Individual Treatment Plan  Patient Details  Name: Regina White MRN: [id number] Date of Birth: 05-31-1956 Referring Provider:     Pulmonary Rehab from 05/01/2018 in [facility] Pulmonary Rehab  Referring Provider  Nolon Stalls MD      Initial Encounter Date:    Pulmonary Rehab from 05/01/2018 in [facility] Cardiac and Pulmonary Rehab  Date  05/01/18  Referring Provider  Nolon Stalls MD      Visit Diagnosis: Pulmonary hypertension ([place])  Patient's Home Medications on Admission:  Current Outpatient Medications:  .  albuterol (PROVENTIL HFA;VENTOLIN HFA) 108 (90 Base) MCG/ACT inhaler, Inhale two puffs every six hours if needed for wheezing, Disp: , Rfl:  .  bacitracin 500 UNIT/GM ointment, Apply 1 application topically 2 (two) times daily., Disp: 30 g, Rfl: 1 .  cyclobenzaprine (FLEXERIL) 10 MG tablet, TAKE 1 TABLET BY MOUTH EVERY 8 HOURS AS NEEDED, Disp: , Rfl:  .  doxycycline (VIBRA-TABS) 100 MG tablet, Take 1 tablet (100 mg total) by mouth 2 (two) times daily., Disp: 20 tablet, Rfl: 0 .  Eszopiclone 3 MG TABS, Take 3 mg by mouth., Disp: , Rfl:  .  Fluticasone-Salmeterol (ADVAIR DISKUS) 500-50 MCG/DOSE AEPB, Inhale into the lungs., Disp: , Rfl:  .  gentamicin cream (GARAMYCIN) 0.1 %, Apply 1 application topically 3 (three) times daily., Disp: 15 g, Rfl: 0 .  ipratropium (ATROVENT HFA) 17 MCG/ACT inhaler, Inhale into the lungs., Disp: , Rfl:  .  liothyronine (CYTOMEL) 25 MCG tablet, TAKE THREE TABLETS DAILY, Disp: , Rfl:  .  lisinopril (PRINIVIL,ZESTRIL) 20 MG tablet, TAKE 1 TABLET EVERY DAY, Disp: , Rfl:  .  metoCLOPramide (REGLAN) 10 MG tablet, Take 10 mg by mouth., Disp: , Rfl:  .  montelukast (SINGULAIR) 10 MG tablet, TAKE 1 TABLET EVERY DAY, Disp: , Rfl:  .  naloxegol oxalate (MOVANTIK) 25 MG TABS tablet, Take 25 mg by mouth., Disp: , Rfl:  .  spironolactone (ALDACTONE) 25 MG tablet, Take 25 mg by mouth., Disp: , Rfl:  .  zafirlukast (ACCOLATE) 20 MG  tablet, Take 20 mg by mouth., Disp: , Rfl:   Past Medical History: History reviewed. No pertinent past medical history.  Tobacco Use: Social History   Tobacco Use  Smoking Status Former Smoker  . Packs/day: 1.00  . Years: 30.00  . Pack years: 30.00  . Types: Cigarettes  . Last attempt to quit: 06/09/1999  . Years since quitting: 18.9  Smokeless Tobacco Never Used    Labs: Recent Review Flowsheet Data    There is no flowsheet data to display.       Pulmonary Assessment Scores: Pulmonary Assessment Scores    Row Name 05/01/18 1527         ADL UCSD   ADL Phase  Entry     SOB Score total  39     Rest  1     Walk  1     Stairs  2     Bath  0     Dress  1     Shop  0       CAT Score   CAT Score  28       mMRC Score   mMRC Score  1        Pulmonary Function Assessment: Pulmonary Function Assessment - 05/01/18 1528      Breath   Bilateral Breath Sounds  Clear;Decreased    Shortness of Breath  Yes;Limiting activity  Exercise Target Goals: Date: 05/01/18  Exercise Program Goal: Individual exercise prescription set using results from initial 6 min walk test and THRR while considering  patient's activity barriers and safety.    Exercise Prescription Goal: Initial exercise prescription builds to 30-45 minutes a day of aerobic activity, 2-3 days per week.  Home exercise guidelines will be given to patient during program as part of exercise prescription that the participant will acknowledge.  Activity Barriers & Risk Stratification: Activity Barriers & Cardiac Risk Stratification - 05/01/18 1628      Activity Barriers & Cardiac Risk Stratification   Activity Barriers  Arthritis;Back Problems;Deconditioning;Muscular Weakness;Shortness of Breath;Balance Concerns;Joint Problems arth in bilateral hips, knees, and ankles       6 Minute Walk: 6 Minute Walk    Row Name 05/01/18 1617         6 Minute Walk   Phase  Initial NuStep Test     Distance  419  feet steps     Walk Time  6 minutes     # of Rest Breaks  0     METS  1.3     RPE  13     Perceived Dyspnea   2     Symptoms  No     Resting HR  78 bpm     Resting BP  126/64     Resting Oxygen Saturation   91 % initial resting was 84%      Exercise Oxygen Saturation  during 6 min walk  83 %     Max Ex. HR  99 bpm     Max Ex. BP  138/70     2 Minute Post BP  130/68       Interval HR   1 Minute HR  77     2 Minute HR  86     3 Minute HR  83     4 Minute HR  90     5 Minute HR  99     6 Minute HR  96     2 Minute Post HR  77     Interval Heart Rate?  Yes       Interval Oxygen   Interval Oxygen?  Yes  (Significant)  Sats after ambulating to and from scale was 71%, recovered to 91% after 3 min     Baseline Oxygen Saturation %  91 %     1 Minute Oxygen Saturation %  92 %     1 Minute Liters of Oxygen  0 L Room Air     2 Minute Oxygen Saturation %  94 %     2 Minute Liters of Oxygen  0 L     3 Minute Oxygen Saturation %  89 %     3 Minute Liters of Oxygen  0 L     4 Minute Oxygen Saturation %  84 %     4 Minute Liters of Oxygen  0 L     5 Minute Oxygen Saturation %  86 %     5 Minute Liters of Oxygen  0 L     6 Minute Oxygen Saturation %  83 %     6 Minute Liters of Oxygen  0 L     2 Minute Post Oxygen Saturation %  93 %     2 Minute Post Liters of Oxygen  0 L       Oxygen Initial Assessment: Oxygen Initial Assessment - 05/01/18  [place] Oxygen Device  None    Sleep Oxygen Prescription  None    Home Exercise Oxygen Prescription  None    Home at Rest Exercise Oxygen Prescription  None      Initial 6 min Walk   Oxygen Used  None      Program Oxygen Prescription   Program Oxygen Prescription  None      Intervention   Short Term Goals  To learn and understand importance of monitoring SPO2 with pulse oximeter and demonstrate accurate use of the pulse oximeter.;To learn and understand importance of maintaining oxygen saturations>88%;To learn and  demonstrate proper pursed lip breathing techniques or other breathing techniques.;To learn and demonstrate proper use of respiratory medications    Long  Term Goals  Demonstrates proper use of MDI's;Compliance with respiratory medication;Exhibits proper breathing techniques, such as pursed lip breathing or other method taught during program session;Maintenance of O2 saturations>88%;Verbalizes importance of monitoring SPO2 with pulse oximeter and return demonstration       Oxygen Re-Evaluation:   Oxygen Discharge (Final Oxygen Re-Evaluation):   Initial Exercise Prescription: Initial Exercise Prescription - 05/01/18 1600      Date of Initial Exercise RX and Referring Provider   Date  05/01/18    Referring Provider  Nolon Stalls MD      Oxygen   Oxygen  Continuous    Liters  1      Arm Ergometer   Level  1    RPM  25    Minutes  15    METs  1.3      T5 Nustep   Level  1    SPM  80    Minutes  15    METs  1.3      Biostep-RELP   Level  1    SPM  50    Minutes  15    METs  2      Prescription Details   Frequency (times per week)  3    Duration  Progress to 45 minutes of aerobic exercise without signs/symptoms of physical distress      Intensity   THRR 40-80% of Max Heartrate  110-141    Ratings of Perceived Exertion  11-13    Perceived Dyspnea  0-4      Progression   Progression  Continue to progress workloads to maintain intensity without signs/symptoms of physical distress.      Resistance Training   Training Prescription  Yes    Weight  3 lbs    Reps  10-15       Perform Capillary Blood Glucose checks as needed.  Exercise Prescription Changes: Exercise Prescription Changes    Row Name 05/01/18 1600             Response to Exercise   Blood Pressure (Admit)  126/64       Blood Pressure (Exercise)  138/70       Blood Pressure (Exit)  130/68       Heart Rate (Admit)  78 bpm       Heart Rate (Exercise)  99 bpm       Heart Rate (Exit)  77 bpm        Oxygen Saturation (Admit)  91 %       Oxygen Saturation (Exercise)  83 %       Oxygen Saturation (Exit)  93 %       Rating of Perceived  Exertion (Exercise)  13       Perceived Dyspnea (Exercise)  2       Symptoms  none       Comments  NuStep test results          Exercise Comments:   Exercise Goals and Review: Exercise Goals    Row Name 05/01/18 1632             Exercise Goals   Increase Physical Activity  Yes       Intervention  Provide advice, education, support and counseling about physical activity/exercise needs.;Develop an individualized exercise prescription for aerobic and resistive training based on initial evaluation findings, risk stratification, comorbidities and participant's personal goals.       Expected Outcomes  Short Term: Attend rehab on a regular basis to increase amount of physical activity.;Long Term: Add in home exercise to make exercise part of routine and to increase amount of physical activity.;Long Term: Exercising regularly at least 3-5 days a week.       Increase Strength and Stamina  Yes       Intervention  Provide advice, education, support and counseling about physical activity/exercise needs.;Develop an individualized exercise prescription for aerobic and resistive training based on initial evaluation findings, risk stratification, comorbidities and participant's personal goals.       Expected Outcomes  Short Term: Increase workloads from initial exercise prescription for resistance, speed, and METs.;Short Term: Perform resistance training exercises routinely during rehab and add in resistance training at home;Long Term: Improve cardiorespiratory fitness, muscular endurance and strength as measured by increased METs and functional capacity (6MWT)       Able to understand and use rate of perceived exertion (RPE) scale  Yes       Intervention  Provide education and explanation on how to use RPE scale       Expected Outcomes  Short Term: Able to use RPE  daily in rehab to express subjective intensity level;Long Term:  Able to use RPE to guide intensity level when exercising independently       Able to understand and use Dyspnea scale  Yes       Intervention  Provide education and explanation on how to use Dyspnea scale       Expected Outcomes  Short Term: Able to use Dyspnea scale daily in rehab to express subjective sense of shortness of breath during exertion;Long Term: Able to use Dyspnea scale to guide intensity level when exercising independently       Knowledge and understanding of Target Heart Rate Range (THRR)  Yes       Intervention  Provide education and explanation of THRR including how the numbers were predicted and where they are located for reference       Expected Outcomes  Short Term: Able to state/look up THRR;Short Term: Able to use daily as guideline for intensity in rehab;Long Term: Able to use THRR to govern intensity when exercising independently       Able to check pulse independently  Yes       Intervention  Provide education and demonstration on how to check pulse in carotid and radial arteries.;Review the importance of being able to check your own pulse for safety during independent exercise       Expected Outcomes  Short Term: Able to explain why pulse checking is important during independent exercise;Long Term: Able to check pulse independently and accurately       Understanding of Exercise Prescription  Yes  Intervention  Provide education, explanation, and written materials on patient's individual exercise prescription       Expected Outcomes  Short Term: Able to explain program exercise prescription;Long Term: Able to explain home exercise prescription to exercise independently          Exercise Goals Re-Evaluation :   Discharge Exercise Prescription (Final Exercise Prescription Changes): Exercise Prescription Changes - 05/01/18 1600      Response to Exercise   Blood Pressure (Admit)  126/64    Blood  Pressure (Exercise)  138/70    Blood Pressure (Exit)  130/68    Heart Rate (Admit)  78 bpm    Heart Rate (Exercise)  99 bpm    Heart Rate (Exit)  77 bpm    Oxygen Saturation (Admit)  91 %    Oxygen Saturation (Exercise)  83 %    Oxygen Saturation (Exit)  93 %    Rating of Perceived Exertion (Exercise)  13    Perceived Dyspnea (Exercise)  2    Symptoms  none    Comments  NuStep test results       Nutrition:  Target Goals: Understanding of nutrition guidelines, daily intake of sodium <1578m, cholesterol <2063m calories 30% from fat and 7% or less from saturated fats, daily to have 5 or more servings of fruits and vegetables.  Biometrics: Pre Biometrics - 05/01/18 1632      Pre Biometrics   Height  5' 0.1" (1.527 m)    Weight  232 lb (105.2 kg)    Waist Circumference  45 inches    Hip Circumference  54 inches    Waist to Hip Ratio  0.83 %    BMI (Calculated)  45.13        Nutrition Therapy Plan and Nutrition Goals: Nutrition Therapy & Goals - 05/01/18 1525      Personal Nutrition Goals   Nutrition Goal  Lose weight    Personal Goal #2  She wants to learn more of what she is eating    Comments  She would like to meet with the dietician to learn how to eat better and lose weight.      Intervention Plan   Intervention  Prescribe, educate and counsel regarding individualized specific dietary modifications aiming towards targeted core components such as weight, hypertension, lipid management, diabetes, heart failure and other comorbidities.    Expected Outcomes  Short Term Goal: Understand basic principles of dietary content, such as calories, fat, sodium, cholesterol and nutrients.;Long Term Goal: Adherence to prescribed nutrition plan.       Nutrition Assessments: Nutrition Assessments - 05/01/18 1525      MEDFICTS Scores   Pre Score  19       Nutrition Goals Re-Evaluation:   Nutrition Goals Discharge (Final Nutrition Goals  Re-Evaluation):   Psychosocial: Target Goals: Acknowledge presence or absence of significant depression and/or stress, maximize coping skills, provide positive support system. Participant is able to verbalize types and ability to use techniques and skills needed for reducing stress and depression.   Initial Review & Psychosocial Screening: Initial Psych Review & Screening - 05/01/18 1520      Initial Review   Current issues with  Current Sleep Concerns;Current Stress Concerns    Source of Stress Concerns  Chronic Illness;Unable to perform yard/household activities;Unable to participate in former interests or hobbies    Comments  She is frustrated with not being able to move and walk well. She cant do household duties like she used to.  Family Dynamics   Good Support System?  Yes    Comments  she can look to her husband, dog and daughter for support.       Barriers   Psychosocial barriers to participate in program  The patient should benefit from training in stress management and relaxation.      Screening Interventions   Interventions  Encouraged to exercise;Program counselor consult;To provide support and resources with identified psychosocial needs;Provide feedback about the scores to participant    Expected Outcomes  Short Term goal: Utilizing psychosocial counselor, staff and physician to assist with identification of specific Stressors or current issues interfering with healing process. Setting desired goal for each stressor or current issue identified.;Long Term Goal: Stressors or current issues are controlled or eliminated.;Short Term goal: Identification and review with participant of any Quality of Life or Depression concerns found by scoring the questionnaire.;Long Term goal: The participant improves quality of Life and PHQ9 Scores as seen by post scores and/or verbalization of changes       Quality of Life Scores:  Scores of 19 and below usually indicate a poorer quality  of life in these areas.  A difference of  2-3 points is a clinically meaningful difference.  A difference of 2-3 points in the total score of the Quality of Life Index has been associated with significant improvement in overall quality of life, self-image, physical symptoms, and general health in studies assessing change in quality of life.  PHQ-9: Recent Review Flowsheet Data    Depression screen [facility] 2/9 05/01/2018   Decreased Interest 2   Down, Depressed, Hopeless 1   PHQ - 2 Score 3   Altered sleeping 3   Tired, decreased energy 1   Change in appetite 0   Feeling bad or failure about yourself  1   Trouble concentrating 0   Moving slowly or fidgety/restless 0   Suicidal thoughts 0   PHQ-9 Score 8   Difficult doing work/chores Very difficult     Interpretation of Total Score  Total Score Depression Severity:  1-4 = Minimal depression, 5-9 = Mild depression, 10-14 = Moderate depression, 15-19 = Moderately severe depression, 20-27 = Severe depression   Psychosocial Evaluation and Intervention:   Psychosocial Re-Evaluation:   Psychosocial Discharge (Final Psychosocial Re-Evaluation):   Education: Education Goals: Education classes will be provided on a weekly basis, covering required topics. Participant will state understanding/return demonstration of topics presented.  Learning Barriers/Preferences: Learning Barriers/Preferences - 05/01/18 1528      Learning Barriers/Preferences   Learning Barriers  None    Learning Preferences  None       Education Topics:  Initial Evaluation Education: - Verbal, written and demonstration of respiratory meds, oximetry and breathing techniques. Instruction on use of nebulizers and MDIs and importance of monitoring MDI activations.   Pulmonary Rehab from 05/01/2018 in [facility] Cardiac and Pulmonary Rehab  Date  05/01/18  Educator  [facility]  Instruction Review Code  1- Verbalizes Understanding      General Nutrition Guidelines/Fats and  Fiber: -Group instruction provided by verbal, written material, models and posters to present the general guidelines for heart healthy nutrition. Gives an explanation and review of dietary fats and fiber.   Controlling Sodium/Reading Food Labels: -Group verbal and written material supporting the discussion of sodium use in heart healthy nutrition. Review and explanation with models, verbal and written materials for utilization of the food label.   Exercise Physiology & General Exercise Guidelines: - Group verbal and written instruction with  models to review the exercise physiology of the cardiovascular system and associated critical values. Provides general exercise guidelines with specific guidelines to those with heart or lung disease.    Aerobic Exercise & Resistance Training: - Gives group verbal and written instruction on the various components of exercise. Focuses on aerobic and resistive training programs and the benefits of this training and how to safely progress through these programs.   Flexibility, Balance, Mind/Body Relaxation: Provides group verbal/written instruction on the benefits of flexibility and balance training, including mind/body exercise modes such as yoga, pilates and tai chi.  Demonstration and skill practice provided.   Stress and Anxiety: - Provides group verbal and written instruction about the health risks of elevated stress and causes of high stress.  Discuss the correlation between heart/lung disease and anxiety and treatment options. Review healthy ways to manage with stress and anxiety.   Depression: - Provides group verbal and written instruction on the correlation between heart/lung disease and depressed mood, treatment options, and the stigmas associated with seeking treatment.   Exercise & Equipment Safety: - Individual verbal instruction and demonstration of equipment use and safety with use of the equipment.   Pulmonary Rehab from 05/01/2018 in  [facility] Cardiac and Pulmonary Rehab  Date  05/01/18  Educator  [facility]  Instruction Review Code  1- Verbalizes Understanding      Infection Prevention: - Provides verbal and written material to individual with discussion of infection control including proper hand washing and proper equipment cleaning during exercise session.   Pulmonary Rehab from 05/01/2018 in [facility] Cardiac and Pulmonary Rehab  Date  05/01/18  Educator  [facility]  Instruction Review Code  1- Verbalizes Understanding      Falls Prevention: - Provides verbal and written material to individual with discussion of falls prevention and safety.   Pulmonary Rehab from 05/01/2018 in [facility] Cardiac and Pulmonary Rehab  Date  05/01/18  Educator  [facility]  Instruction Review Code  1- Verbalizes Understanding      Diabetes: - Individual verbal and written instruction to review signs/symptoms of diabetes, desired ranges of glucose level fasting, after meals and with exercise. Advice that pre and post exercise glucose checks will be done for 3 sessions at entry of program.   Chronic Lung Diseases: - Group verbal and written instruction to review updates, respiratory medications, advancements in procedures and treatments. Discuss use of supplemental oxygen including available portable oxygen systems, continuous and intermittent flow rates, concentrators, personal use and safety guidelines. Review proper use of inhaler and spacers. Provide informative websites for self-education.    Energy Conservation: - Provide group verbal and written instruction for methods to conserve energy, plan and organize activities. Instruct on pacing techniques, use of adaptive equipment and posture/positioning to relieve shortness of breath.   Triggers and Exacerbations: - Group verbal and written instruction to review types of environmental triggers and ways to prevent exacerbations. Discuss weather changes, air quality and the benefits of nasal washing. Review warning  signs and symptoms to help prevent infections. Discuss techniques for effective airway clearance, coughing, and vibrations.   AED/CPR: - Group verbal and written instruction with the use of models to demonstrate the basic use of the AED with the basic ABC's of resuscitation.   Anatomy and Physiology of the Lungs: - Group verbal and written instruction with the use of models to provide basic lung anatomy and physiology related to function, structure and complications of lung disease.   Anatomy & Physiology of the Heart: - Group verbal  and written instruction and models provide basic cardiac anatomy and physiology, with the coronary electrical and arterial systems. Review of Valvular disease and Heart Failure   Cardiac Medications: - Group verbal and written instruction to review commonly prescribed medications for heart disease. Reviews the medication, class of the drug, and side effects.   Know Your Numbers and Risk Factors: -Group verbal and written instruction about important numbers in your health.  Discussion of what are risk factors and how they play a role in the disease process.  Review of Cholesterol, Blood Pressure, Diabetes, and BMI and the role they play in your overall health.   Sleep Hygiene: -Provides group verbal and written instruction about how sleep can affect your health.  Define sleep hygiene, discuss sleep cycles and impact of sleep habits. Review good sleep hygiene tips.    Other: -Provides group and verbal instruction on various topics (see comments)    Knowledge Questionnaire Score: Knowledge Questionnaire Score - 05/01/18 1528      Knowledge Questionnaire Score   Pre Score  15/18 reviewed with patient        Core Components/Risk Factors/Patient Goals at Admission: Personal Goals and Risk Factors at Admission - 05/01/18 1530      Core Components/Risk Factors/Patient Goals on Admission    Weight Management  Yes;Obesity;Weight Loss    Intervention   Weight Management: Develop a combined nutrition and exercise program designed to reach desired caloric intake, while maintaining appropriate intake of nutrient and fiber, sodium and fats, and appropriate energy expenditure required for the weight goal.;Weight Management: Provide education and appropriate resources to help participant work on and attain dietary goals.;Weight Management/Obesity: Establish reasonable short term and long term weight goals.;Obesity: Provide education and appropriate resources to help participant work on and attain dietary goals.    Admit Weight  232 lb (105.2 kg)    Goal Weight: Short Term  227 lb (103 kg)    Goal Weight: Long Term  180 lb (81.6 kg)    Expected Outcomes  Short Term: Continue to assess and modify interventions until short term weight is achieved;Long Term: Adherence to nutrition and physical activity/exercise program aimed toward attainment of established weight goal;Weight Maintenance: Understanding of the daily nutrition guidelines, which includes 25-35% calories from fat, 7% or less cal from saturated fats, less than 264m cholesterol, less than 1.5gm of sodium, & 5 or more servings of fruits and vegetables daily;Weight Loss: Understanding of general recommendations for a balanced deficit meal plan, which promotes 1-2 lb weight loss per week and includes a negative energy balance of [phone] kcal/d;Understanding recommendations for meals to include 15-35% energy as protein, 25-35% energy from fat, 35-60% energy from carbohydrates, less than 2045mof dietary cholesterol, 20-35 gm of total fiber daily;Understanding of distribution of calorie intake throughout the day with the consumption of 4-5 meals/snacks    Improve shortness of breath with ADL's  Yes    Intervention  Provide education, individualized exercise plan and daily activity instruction to help decrease symptoms of SOB with activities of daily living.    Expected Outcomes  Short Term: Improve  cardiorespiratory fitness to achieve a reduction of symptoms when performing ADLs;Long Term: Be able to perform more ADLs without symptoms or delay the onset of symptoms    Heart Failure  Yes    Intervention  Provide a combined exercise and nutrition program that is supplemented with education, support and counseling about heart failure. Directed toward relieving symptoms such as shortness of breath, decreased exercise tolerance, and  extremity edema.    Expected Outcomes  Improve functional capacity of life;Short term: Attendance in program 2-3 days a week with increased exercise capacity. Reported lower sodium intake. Reported increased fruit and vegetable intake. Reports medication compliance.;Short term: Daily weights obtained and reported for increase. Utilizing diuretic protocols set by physician.;Long term: Adoption of self-care skills and reduction of barriers for early signs and symptoms recognition and intervention leading to self-care maintenance.    Hypertension  Yes    Intervention  Provide education on lifestyle modifcations including regular physical activity/exercise, weight management, moderate sodium restriction and increased consumption of fresh fruit, vegetables, and low fat dairy, alcohol moderation, and smoking cessation.;Monitor prescription use compliance.    Expected Outcomes  Short Term: Continued assessment and intervention until BP is < 140/10m HG in hypertensive participants. < 130/854mHG in hypertensive participants with diabetes, heart failure or chronic kidney disease.;Long Term: Maintenance of blood pressure at goal levels.       Core Components/Risk Factors/Patient Goals Review:    Core Components/Risk Factors/Patient Goals at Discharge (Final Review):    ITP Comments: ITP Comments    Row Name 05/01/18 1536           ITP Comments  Medical Evaluation completed. Chart sent for review and changes to Dr. MaEmily Filbertirector of [facility] can be found  in [facility] 07/13/16          Comments: Initial ITP

## 2018-05-01 NOTE — Patient Instructions (Signed)
Patient Instructions  Patient Details  Name: Regina White MRN: [id number] Date of Birth: 1956-07-27 Referring Provider:  Freeman Caldron, MD  Below are your personal goals for exercise, nutrition, and risk factors. Our goal is to help you stay on track towards obtaining and maintaining these goals. We will be discussing your progress on these goals with you throughout the program.  Initial Exercise Prescription: Initial Exercise Prescription - 05/01/18 1600      Date of Initial Exercise RX and Referring Provider   Date  05/01/18    Referring Provider  Freeman Caldron MD      Oxygen   Oxygen  Continuous    Liters  1      Arm Ergometer   Level  1    RPM  25    Minutes  15    METs  1.3      T5 Nustep   Level  1    SPM  80    Minutes  15    METs  1.3      Biostep-RELP   Level  1    SPM  50    Minutes  15    METs  2      Prescription Details   Frequency (times per week)  3    Duration  Progress to 45 minutes of aerobic exercise without signs/symptoms of physical distress      Intensity   THRR 40-80% of Max Heartrate  110-141    Ratings of Perceived Exertion  11-13    Perceived Dyspnea  0-4      Progression   Progression  Continue to progress workloads to maintain intensity without signs/symptoms of physical distress.      Resistance Training   Training Prescription  Yes    Weight  3 lbs    Reps  10-15       Exercise Goals: Frequency: Be able to perform aerobic exercise two to three times per week in program working toward 2-5 days per week of home exercise.  Intensity: Work with a perceived exertion of 11 (fairly light) - 15 (hard) while following your exercise prescription.  We will make changes to your prescription with you as you progress through the program.   Duration: Be able to do 30 to 45 minutes of continuous aerobic exercise in addition to a 5 minute warm-up and a 5 minute cool-down routine.   Nutrition Goals: Your personal nutrition goals  will be established when you do your nutrition analysis with the dietician.  The following are general nutrition guidelines to follow: Cholesterol < 200mg /day Sodium < 1500mg /day Fiber: Women over 50 yrs - 21 grams per day  Personal Goals: Personal Goals and Risk Factors at Admission - 05/01/18 1530      Core Components/Risk Factors/Patient Goals on Admission    Weight Management  Yes;Obesity;Weight Loss    Intervention  Weight Management: Develop a combined nutrition and exercise program designed to reach desired caloric intake, while maintaining appropriate intake of nutrient and fiber, sodium and fats, and appropriate energy expenditure required for the weight goal.;Weight Management: Provide education and appropriate resources to help participant work on and attain dietary goals.;Weight Management/Obesity: Establish reasonable short term and long term weight goals.;Obesity: Provide education and appropriate resources to help participant work on and attain dietary goals.    Admit Weight  232 lb (105.2 kg)    Goal Weight: Short Term  227 lb (103 kg)    Goal Weight: Long Term  180  lb (81.6 kg)    Expected Outcomes  Short Term: Continue to assess and modify interventions until short term weight is achieved;Long Term: Adherence to nutrition and physical activity/exercise program aimed toward attainment of established weight goal;Weight Maintenance: Understanding of the daily nutrition guidelines, which includes 25-35% calories from fat, 7% or less cal from saturated fats, less than 200mg  cholesterol, less than 1.5gm of sodium, & 5 or more servings of fruits and vegetables daily;Weight Loss: Understanding of general recommendations for a balanced deficit meal plan, which promotes 1-2 lb weight loss per week and includes a negative energy balance of [phone] kcal/d;Understanding recommendations for meals to include 15-35% energy as protein, 25-35% energy from fat, 35-60% energy from carbohydrates, less  than 200mg  of dietary cholesterol, 20-35 gm of total fiber daily;Understanding of distribution of calorie intake throughout the day with the consumption of 4-5 meals/snacks    Improve shortness of breath with ADL's  Yes    Intervention  Provide education, individualized exercise plan and daily activity instruction to help decrease symptoms of SOB with activities of daily living.    Expected Outcomes  Short Term: Improve cardiorespiratory fitness to achieve a reduction of symptoms when performing ADLs;Long Term: Be able to perform more ADLs without symptoms or delay the onset of symptoms    Heart Failure  Yes    Intervention  Provide a combined exercise and nutrition program that is supplemented with education, support and counseling about heart failure. Directed toward relieving symptoms such as shortness of breath, decreased exercise tolerance, and extremity edema.    Expected Outcomes  Improve functional capacity of life;Short term: Attendance in program 2-3 days a week with increased exercise capacity. Reported lower sodium intake. Reported increased fruit and vegetable intake. Reports medication compliance.;Short term: Daily weights obtained and reported for increase. Utilizing diuretic protocols set by physician.;Long term: Adoption of self-care skills and reduction of barriers for early signs and symptoms recognition and intervention leading to self-care maintenance.    Hypertension  Yes    Intervention  Provide education on lifestyle modifcations including regular physical activity/exercise, weight management, moderate sodium restriction and increased consumption of fresh fruit, vegetables, and low fat dairy, alcohol moderation, and smoking cessation.;Monitor prescription use compliance.    Expected Outcomes  Short Term: Continued assessment and intervention until BP is < 140/2590mm HG in hypertensive participants. < 130/5280mm HG in hypertensive participants with diabetes, heart failure or chronic  kidney disease.;Long Term: Maintenance of blood pressure at goal levels.       Tobacco Use Initial Evaluation: Social History   Tobacco Use  Smoking Status Former Smoker  . Packs/day: 1.00  . Years: 30.00  . Pack years: 30.00  . Types: Cigarettes  . Last attempt to quit: 06/09/1999  . Years since quitting: 18.9  Smokeless Tobacco Never Used    Exercise Goals and Review: Exercise Goals    Row Name 05/01/18 1632             Exercise Goals   Increase Physical Activity  Yes       Intervention  Provide advice, education, support and counseling about physical activity/exercise needs.;Develop an individualized exercise prescription for aerobic and resistive training based on initial evaluation findings, risk stratification, comorbidities and participant's personal goals.       Expected Outcomes  Short Term: Attend rehab on a regular basis to increase amount of physical activity.;Long Term: Add in home exercise to make exercise part of routine and to increase amount of physical activity.;Long Term: Exercising regularly  at least 3-5 days a week.       Increase Strength and Stamina  Yes       Intervention  Provide advice, education, support and counseling about physical activity/exercise needs.;Develop an individualized exercise prescription for aerobic and resistive training based on initial evaluation findings, risk stratification, comorbidities and participant's personal goals.       Expected Outcomes  Short Term: Increase workloads from initial exercise prescription for resistance, speed, and METs.;Short Term: Perform resistance training exercises routinely during rehab and add in resistance training at home;Long Term: Improve cardiorespiratory fitness, muscular endurance and strength as measured by increased METs and functional capacity ( )       Able to understand and use rate of perceived exertion (RPE) scale  Yes       Intervention  Provide education and explanation on how to use RPE  scale       Expected Outcomes  Short Term: Able to use RPE daily in rehab to express subjective intensity level;Long Term:  Able to use RPE to guide intensity level when exercising independently       Able to understand and use Dyspnea scale  Yes       Intervention  Provide education and explanation on how to use Dyspnea scale       Expected Outcomes  Short Term: Able to use Dyspnea scale daily in rehab to express subjective sense of shortness of breath during exertion;Long Term: Able to use Dyspnea scale to guide intensity level when exercising independently       Knowledge and understanding of Target Heart Rate Range (THRR)  Yes       Intervention  Provide education and explanation of THRR including how the numbers were predicted and where they are located for reference       Expected Outcomes  Short Term: Able to state/look up THRR;Short Term: Able to use daily as guideline for intensity in rehab;Long Term: Able to use THRR to govern intensity when exercising independently       Able to check pulse independently  Yes       Intervention  Provide education and demonstration on how to check pulse in carotid and radial arteries.;Review the importance of being able to check your own pulse for safety during independent exercise       Expected Outcomes  Short Term: Able to explain why pulse checking is important during independent exercise;Long Term: Able to check pulse independently and accurately       Understanding of Exercise Prescription  Yes       Intervention  Provide education, explanation, and written materials on patient's individual exercise prescription       Expected Outcomes  Short Term: Able to explain program exercise prescription;Long Term: Able to explain home exercise prescription to exercise independently          Copy of goals given to participant.

## 2018-05-08 ENCOUNTER — Encounter: Payer: 59 | Attending: Internal Medicine

## 2018-05-08 DIAGNOSIS — I272 Pulmonary hypertension, unspecified: Secondary | ICD-10-CM | POA: Insufficient documentation

## 2018-05-08 DIAGNOSIS — Z79899 Other long term (current) drug therapy: Secondary | ICD-10-CM | POA: Diagnosis not present

## 2018-05-08 DIAGNOSIS — Z87891 Personal history of nicotine dependence: Secondary | ICD-10-CM | POA: Insufficient documentation

## 2018-05-08 NOTE — Progress Notes (Signed)
Daily Session Note  Patient Details  Name: Regina White MRN: [id number] Date of Birth: 02-11-1956 Referring Provider:     Pulmonary Rehab from 05/01/2018 in [facility]  Referring Provider  Nolon Stalls MD      Encounter Date: 05/08/2018  Check In: Session Check In - 05/08/18 1505      Check-In   Location  [facility]    Staff Present  Justin Mend RCP,RRT,BSRT;Laureen Owens Shark, BS, RRT, Respiratory Therapist;Nana Addai, RN BSN    Supervising physician immediately available to respond to emergencies  [facility] immediately available ER MD    Physician(s)  Dr. Jimmye Norman and Corky Downs    Medication changes reported      No    Fall or balance concerns reported     No    Warm-up and Cool-down  Performed as group-led instruction    Resistance Training Performed  Yes    VAD Patient?  No      Pain Assessment   Currently in Pain?  No/denies          Social History   Tobacco Use  Smoking Status Former Smoker  . Packs/day: 1.00  . Years: 30.00  . Pack years: 30.00  . Types: Cigarettes  . Last attempt to quit: 06/09/1999  . Years since quitting: 18.9  Smokeless Tobacco Never Used    Goals Met:  Exercise tolerated well Personal goals reviewed Queuing for purse lip breathing No report of cardiac concerns or symptoms Strength training completed today  Goals Unmet:  Not Applicable  Comments: First full day of exercise!  Patient was oriented to gym and equipment including functions, settings, policies, and procedures.  Patient's individual exercise prescription and treatment plan were reviewed.  All starting workloads were established based on the results of the 6 minute walk test done at initial orientation visit.  The plan for exercise progression was also introduced and progression will be customized based on patient's performance and goals.   Dr. Emily Filbert is Medical Director for [facility] and [facility]  Pulmonary Rehabilitation.

## 2018-05-08 NOTE — Progress Notes (Signed)
Pulmonary Individual Treatment Plan  Patient Details  Name: Regina White MRN: [id number] Date of Birth: 1956/08/12 Referring Provider:     Pulmonary Rehab from 05/01/2018 in [facility]iac and Pulmonary Rehab  Referring Provider  Nolon Stalls MD      Initial Encounter Date:    Pulmonary Rehab from 05/01/2018 in [facility] Cardiac and Pulmonary Rehab  Date  05/01/18      Visit Diagnosis: Pulmonary hypertension ([place])  Patient's Home Medications on Admission:  Current Outpatient Medications:  .  albuterol (PROVENTIL HFA;VENTOLIN HFA) 108 (90 Base) MCG/ACT inhaler, Inhale two puffs every six hours if needed for wheezing, Disp: , Rfl:  .  bacitracin 500 UNIT/GM ointment, Apply 1 application topically 2 (two) times daily., Disp: 30 g, Rfl: 1 .  cyclobenzaprine (FLEXERIL) 10 MG tablet, TAKE 1 TABLET BY MOUTH EVERY 8 HOURS AS NEEDED, Disp: , Rfl:  .  doxycycline (VIBRA-TABS) 100 MG tablet, Take 1 tablet (100 mg total) by mouth 2 (two) times daily., Disp: 20 tablet, Rfl: 0 .  Eszopiclone 3 MG TABS, Take 3 mg by mouth., Disp: , Rfl:  .  Fluticasone-Salmeterol (ADVAIR DISKUS) 500-50 MCG/DOSE AEPB, Inhale into the lungs., Disp: , Rfl:  .  gentamicin cream (GARAMYCIN) 0.1 %, Apply 1 application topically 3 (three) times daily., Disp: 15 g, Rfl: 0 .  ipratropium (ATROVENT HFA) 17 MCG/ACT inhaler, Inhale into the lungs., Disp: , Rfl:  .  liothyronine (CYTOMEL) 25 MCG tablet, TAKE THREE TABLETS DAILY, Disp: , Rfl:  .  lisinopril (PRINIVIL,ZESTRIL) 20 MG tablet, TAKE 1 TABLET EVERY DAY, Disp: , Rfl:  .  metoCLOPramide (REGLAN) 10 MG tablet, Take 10 mg by mouth., Disp: , Rfl:  .  montelukast (SINGULAIR) 10 MG tablet, TAKE 1 TABLET EVERY DAY, Disp: , Rfl:  .  naloxegol oxalate (MOVANTIK) 25 MG TABS tablet, Take 25 mg by mouth., Disp: , Rfl:  .  spironolactone (ALDACTONE) 25 MG tablet, Take 25 mg by mouth., Disp: , Rfl:  .  zafirlukast (ACCOLATE) 20 MG tablet, Take 20 mg by mouth., Disp: , Rfl:    Past Medical History: No past medical history on file.  Tobacco Use: Social History   Tobacco Use  Smoking Status Former Smoker  . Packs/day: 1.00  . Years: 30.00  . Pack years: 30.00  . Types: Cigarettes  . Last attempt to quit: 06/09/1999  . Years since quitting: 18.9  Smokeless Tobacco Never Used    Labs: Recent Review Flowsheet Data    There is no flowsheet data to display.       Pulmonary Assessment Scores: Pulmonary Assessment Scores    Row Name 05/01/18 1527         ADL UCSD   ADL Phase  Entry     SOB Score total  39     Rest  1     Walk  1     Stairs  2     Bath  0     Dress  1     Shop  0       CAT Score   CAT Score  28       mMRC Score   mMRC Score  1        Pulmonary Function Assessment: Pulmonary Function Assessment - 05/01/18 1528      Breath   Bilateral Breath Sounds  Clear;Decreased    Shortness of Breath  Yes;Limiting activity       Exercise Target Goals:    Exercise Program  Goal: Individual exercise prescription set using results from initial 6 min walk test and THRR while considering  patient's activity barriers and safety.    Exercise Prescription Goal: Initial exercise prescription builds to 30-45 minutes a day of aerobic activity, 2-3 days per week.  Home exercise guidelines will be given to patient during program as part of exercise prescription that the participant will acknowledge.  Activity Barriers & Risk Stratification: Activity Barriers & Cardiac Risk Stratification - 05/01/18 1628      Activity Barriers & Cardiac Risk Stratification   Activity Barriers  Arthritis;Back Problems;Deconditioning;Muscular Weakness;Shortness of Breath;Balance Concerns;Joint Problems arth in bilateral hips, knees, and ankles       6 Minute Walk: 6 Minute Walk    Row Name 05/01/18 1617         6 Minute Walk   Phase  Initial NuStep Test     Distance  419 feet steps     Walk Time  6 minutes     # of Rest Breaks  0     METS  1.3      RPE  13     Perceived Dyspnea   2     Symptoms  No     Resting HR  78 bpm     Resting BP  126/64     Resting Oxygen Saturation   91 % initial resting was 84%      Exercise Oxygen Saturation  during 6 min walk  83 %     Max Ex. HR  99 bpm     Max Ex. BP  138/70     2 Minute Post BP  130/68       Interval HR   1 Minute HR  77     2 Minute HR  86     3 Minute HR  83     4 Minute HR  90     5 Minute HR  99     6 Minute HR  96     2 Minute Post HR  77     Interval Heart Rate?  Yes       Interval Oxygen   Interval Oxygen?  Yes  (Significant)  Sats after ambulating to and from scale was 71%, recovered to 91% after 3 min     Baseline Oxygen Saturation %  91 %     1 Minute Oxygen Saturation %  92 %     1 Minute Liters of Oxygen  0 L Room Air     2 Minute Oxygen Saturation %  94 %     2 Minute Liters of Oxygen  0 L     3 Minute Oxygen Saturation %  89 %     3 Minute Liters of Oxygen  0 L     4 Minute Oxygen Saturation %  84 %     4 Minute Liters of Oxygen  0 L     5 Minute Oxygen Saturation %  86 %     5 Minute Liters of Oxygen  0 L     6 Minute Oxygen Saturation %  83 %     6 Minute Liters of Oxygen  0 L     2 Minute Post Oxygen Saturation %  93 %     2 Minute Post Liters of Oxygen  0 L       Oxygen Initial Assessment: Oxygen Initial Assessment - 05/01/18 1529      Home Oxygen  Home Oxygen Device  None    Sleep Oxygen Prescription  None    Home Exercise Oxygen Prescription  None    Home at Rest Exercise Oxygen Prescription  None      Initial 6 min Walk   Oxygen Used  None      Program Oxygen Prescription   Program Oxygen Prescription  None      Intervention   Short Term Goals  To learn and understand importance of monitoring SPO2 with pulse oximeter and demonstrate accurate use of the pulse oximeter.;To learn and understand importance of maintaining oxygen saturations>88%;To learn and demonstrate proper pursed lip breathing techniques or other breathing  techniques.;To learn and demonstrate proper use of respiratory medications    Long  Term Goals  Demonstrates proper use of MDI's;Compliance with respiratory medication;Exhibits proper breathing techniques, such as pursed lip breathing or other method taught during program session;Maintenance of O2 saturations>88%;Verbalizes importance of monitoring SPO2 with pulse oximeter and return demonstration       Oxygen Re-Evaluation: Oxygen Re-Evaluation    Row Name 05/08/18 1507             Program Oxygen Prescription   Program Oxygen Prescription  None         Home Oxygen   Home Oxygen Device  None       Sleep Oxygen Prescription  None       Home Exercise Oxygen Prescription  None       Home at Rest Exercise Oxygen Prescription  None         Goals/Expected Outcomes   Short Term Goals  To learn and understand importance of monitoring SPO2 with pulse oximeter and demonstrate accurate use of the pulse oximeter.;To learn and understand importance of maintaining oxygen saturations>88%;To learn and demonstrate proper pursed lip breathing techniques or other breathing techniques.;To learn and demonstrate proper use of respiratory medications       Long  Term Goals  Demonstrates proper use of MDI's;Compliance with respiratory medication;Exhibits proper breathing techniques, such as pursed lip breathing or other method taught during program session;Maintenance of O2 saturations>88%;Verbalizes importance of monitoring SPO2 with pulse oximeter and return demonstration       Comments  Reviewed PLB technique with pt.  Talked about how it work and it's important to maintaining his exercise saturations.         Goals/Expected Outcomes  Short: Become more profiecient at using PLB.   Long: Become independent at using PLB.          Oxygen Discharge (Final Oxygen Re-Evaluation): Oxygen Re-Evaluation - 05/08/18 1507      Program Oxygen Prescription   Program Oxygen Prescription  None      Home Oxygen    Home Oxygen Device  None    Sleep Oxygen Prescription  None    Home Exercise Oxygen Prescription  None    Home at Rest Exercise Oxygen Prescription  None      Goals/Expected Outcomes   Short Term Goals  To learn and understand importance of monitoring SPO2 with pulse oximeter and demonstrate accurate use of the pulse oximeter.;To learn and understand importance of maintaining oxygen saturations>88%;To learn and demonstrate proper pursed lip breathing techniques or other breathing techniques.;To learn and demonstrate proper use of respiratory medications    Long  Term Goals  Demonstrates proper use of MDI's;Compliance with respiratory medication;Exhibits proper breathing techniques, such as pursed lip breathing or other method taught during program session;Maintenance of O2 saturations>88%;Verbalizes importance of monitoring SPO2 with pulse  oximeter and return demonstration    Comments  Reviewed PLB technique with pt.  Talked about how it work and it's important to maintaining his exercise saturations.      Goals/Expected Outcomes  Short: Become more profiecient at using PLB.   Long: Become independent at using PLB.       Initial Exercise Prescription: Initial Exercise Prescription - 05/01/18 1600      Date of Initial Exercise RX and Referring Provider   Date  05/01/18    Referring Provider  Nolon Stalls MD      Oxygen   Oxygen  Continuous    Liters  1      Arm Ergometer   Level  1    RPM  25    Minutes  15    METs  1.3      T5 Nustep   Level  1    SPM  80    Minutes  15    METs  1.3      Biostep-RELP   Level  1    SPM  50    Minutes  15    METs  2      Prescription Details   Frequency (times per week)  3    Duration  Progress to 45 minutes of aerobic exercise without signs/symptoms of physical distress      Intensity   THRR 40-80% of Max Heartrate  110-141    Ratings of Perceived Exertion  11-13    Perceived Dyspnea  0-4      Progression   Progression   Continue to progress workloads to maintain intensity without signs/symptoms of physical distress.      Resistance Training   Training Prescription  Yes    Weight  3 lbs    Reps  10-15       Perform Capillary Blood Glucose checks as needed.  Exercise Prescription Changes: Exercise Prescription Changes    Row Name 05/01/18 1600             Response to Exercise   Blood Pressure (Admit)  126/64       Blood Pressure (Exercise)  138/70       Blood Pressure (Exit)  130/68       Heart Rate (Admit)  78 bpm       Heart Rate (Exercise)  99 bpm       Heart Rate (Exit)  77 bpm       Oxygen Saturation (Admit)  91 %       Oxygen Saturation (Exercise)  83 %       Oxygen Saturation (Exit)  93 %       Rating of Perceived Exertion (Exercise)  13       Perceived Dyspnea (Exercise)  2       Symptoms  none       Comments  NuStep test results          Exercise Comments: Exercise Comments    Row Name 05/08/18 1507           Exercise Comments   First full day of exercise!  Patient was oriented to gym and equipment including functions, settings, policies, and procedures.  Patient's individual exercise prescription and treatment plan were reviewed.  All starting workloads were established based on the results of the 6 minute walk test done at initial orientation visit.  The plan for exercise progression was also introduced and progression will be customized based on patient's performance  and goals.          Exercise Goals and Review: Exercise Goals    Row Name 05/01/18 1632             Exercise Goals   Increase Physical Activity  Yes       Intervention  Provide advice, education, support and counseling about physical activity/exercise needs.;Develop an individualized exercise prescription for aerobic and resistive training based on initial evaluation findings, risk stratification, comorbidities and participant's personal goals.       Expected Outcomes  Short Term: Attend rehab on a  regular basis to increase amount of physical activity.;Long Term: Add in home exercise to make exercise part of routine and to increase amount of physical activity.;Long Term: Exercising regularly at least 3-5 days a week.       Increase Strength and Stamina  Yes       Intervention  Provide advice, education, support and counseling about physical activity/exercise needs.;Develop an individualized exercise prescription for aerobic and resistive training based on initial evaluation findings, risk stratification, comorbidities and participant's personal goals.       Expected Outcomes  Short Term: Increase workloads from initial exercise prescription for resistance, speed, and METs.;Short Term: Perform resistance training exercises routinely during rehab and add in resistance training at home;Long Term: Improve cardiorespiratory fitness, muscular endurance and strength as measured by increased METs and functional capacity (6MWT)       Able to understand and use rate of perceived exertion (RPE) scale  Yes       Intervention  Provide education and explanation on how to use RPE scale       Expected Outcomes  Short Term: Able to use RPE daily in rehab to express subjective intensity level;Long Term:  Able to use RPE to guide intensity level when exercising independently       Able to understand and use Dyspnea scale  Yes       Intervention  Provide education and explanation on how to use Dyspnea scale       Expected Outcomes  Short Term: Able to use Dyspnea scale daily in rehab to express subjective sense of shortness of breath during exertion;Long Term: Able to use Dyspnea scale to guide intensity level when exercising independently       Knowledge and understanding of Target Heart Rate Range (THRR)  Yes       Intervention  Provide education and explanation of THRR including how the numbers were predicted and where they are located for reference       Expected Outcomes  Short Term: Able to state/look up  THRR;Short Term: Able to use daily as guideline for intensity in rehab;Long Term: Able to use THRR to govern intensity when exercising independently       Able to check pulse independently  Yes       Intervention  Provide education and demonstration on how to check pulse in carotid and radial arteries.;Review the importance of being able to check your own pulse for safety during independent exercise       Expected Outcomes  Short Term: Able to explain why pulse checking is important during independent exercise;Long Term: Able to check pulse independently and accurately       Understanding of Exercise Prescription  Yes       Intervention  Provide education, explanation, and written materials on patient's individual exercise prescription       Expected Outcomes  Short Term: Able to explain program exercise prescription;Long Term:  Able to explain home exercise prescription to exercise independently          Exercise Goals Re-Evaluation : Exercise Goals Re-Evaluation    Row Name 05/08/18 1507             Exercise Goal Re-Evaluation   Exercise Goals Review  Understanding of Exercise Prescription;Able to understand and use Dyspnea scale;Knowledge and understanding of Target Heart Rate Range (THRR);Able to understand and use rate of perceived exertion (RPE) scale       Comments  Reviewed RPE scale, THR and program prescription with pt today.  Pt voiced understanding and was given a copy of goals to take home.        Expected Outcomes  Short: Use RPE daily to regulate intensity.  Long: Follow program prescription in THR.          Discharge Exercise Prescription (Final Exercise Prescription Changes): Exercise Prescription Changes - 05/01/18 1600      Response to Exercise   Blood Pressure (Admit)  126/64    Blood Pressure (Exercise)  138/70    Blood Pressure (Exit)  130/68    Heart Rate (Admit)  78 bpm    Heart Rate (Exercise)  99 bpm    Heart Rate (Exit)  77 bpm    Oxygen Saturation (Admit)   91 %    Oxygen Saturation (Exercise)  83 %    Oxygen Saturation (Exit)  93 %    Rating of Perceived Exertion (Exercise)  13    Perceived Dyspnea (Exercise)  2    Symptoms  none    Comments  NuStep test results       Nutrition:  Target Goals: Understanding of nutrition guidelines, daily intake of sodium <1512m, cholesterol <2033m calories 30% from fat and 7% or less from saturated fats, daily to have 5 or more servings of fruits and vegetables.  Biometrics: Pre Biometrics - 05/01/18 1632      Pre Biometrics   Height  5' 0.1" (1.527 m)    Weight  232 lb (105.2 kg)    Waist Circumference  45 inches    Hip Circumference  54 inches    Waist to Hip Ratio  0.83 %    BMI (Calculated)  45.13        Nutrition Therapy Plan and Nutrition Goals: Nutrition Therapy & Goals - 05/01/18 1525      Personal Nutrition Goals   Nutrition Goal  Lose weight    Personal Goal #2  She wants to learn more of what she is eating    Comments  She would like to meet with the dietician to learn how to eat better and lose weight.      Intervention Plan   Intervention  Prescribe, educate and counsel regarding individualized specific dietary modifications aiming towards targeted core components such as weight, hypertension, lipid management, diabetes, heart failure and other comorbidities.    Expected Outcomes  Short Term Goal: Understand basic principles of dietary content, such as calories, fat, sodium, cholesterol and nutrients.;Long Term Goal: Adherence to prescribed nutrition plan.       Nutrition Assessments: Nutrition Assessments - 05/01/18 1525      MEDFICTS Scores   Pre Score  19       Nutrition Goals Re-Evaluation:   Nutrition Goals Discharge (Final Nutrition Goals Re-Evaluation):   Psychosocial: Target Goals: Acknowledge presence or absence of significant depression and/or stress, maximize coping skills, provide positive support system. Participant is able to verbalize types and  ability to  use techniques and skills needed for reducing stress and depression.   Initial Review & Psychosocial Screening: Initial Psych Review & Screening - 05/01/18 1520      Initial Review   Current issues with  Current Sleep Concerns;Current Stress Concerns    Source of Stress Concerns  Chronic Illness;Unable to perform yard/household activities;Unable to participate in former interests or hobbies    Comments  She is frustrated with not being able to move and walk well. She cant do household duties like she used to.      Family Dynamics   Good Support System?  Yes    Comments  she can look to her husband, dog and daughter for support.       Barriers   Psychosocial barriers to participate in program  The patient should benefit from training in stress management and relaxation.      Screening Interventions   Interventions  Encouraged to exercise;Program counselor consult;To provide support and resources with identified psychosocial needs;Provide feedback about the scores to participant    Expected Outcomes  Short Term goal: Utilizing psychosocial counselor, staff and physician to assist with identification of specific Stressors or current issues interfering with healing process. Setting desired goal for each stressor or current issue identified.;Long Term Goal: Stressors or current issues are controlled or eliminated.;Short Term goal: Identification and review with participant of any Quality of Life or Depression concerns found by scoring the questionnaire.;Long Term goal: The participant improves quality of Life and PHQ9 Scores as seen by post scores and/or verbalization of changes       Quality of Life Scores:  Scores of 19 and below usually indicate a poorer quality of life in these areas.  A difference of  2-3 points is a clinically meaningful difference.  A difference of 2-3 points in the total score of the Quality of Life Index has been associated with significant improvement in  overall quality of life, self-image, physical symptoms, and general health in studies assessing change in quality of life.  PHQ-9: Recent Review Flowsheet Data    Depression screen [facility] 2/9 05/01/2018   Decreased Interest 2   Down, Depressed, Hopeless 1   PHQ - 2 Score 3   Altered sleeping 3   Tired, decreased energy 1   Change in appetite 0   Feeling bad or failure about yourself  1   Trouble concentrating 0   Moving slowly or fidgety/restless 0   Suicidal thoughts 0   PHQ-9 Score 8   Difficult doing work/chores Very difficult     Interpretation of Total Score  Total Score Depression Severity:  1-4 = Minimal depression, 5-9 = Mild depression, 10-14 = Moderate depression, 15-19 = Moderately severe depression, 20-27 = Severe depression   Psychosocial Evaluation and Intervention:   Psychosocial Re-Evaluation:   Psychosocial Discharge (Final Psychosocial Re-Evaluation):   Education: Education Goals: Education classes will be provided on a weekly basis, covering required topics. Participant will state understanding/return demonstration of topics presented.  Learning Barriers/Preferences: Learning Barriers/Preferences - 05/01/18 1528      Learning Barriers/Preferences   Learning Barriers  None    Learning Preferences  None       Education Topics:  Initial Evaluation Education: - Verbal, written and demonstration of respiratory meds, oximetry and breathing techniques. Instruction on use of nebulizers and MDIs and importance of monitoring MDI activations.   Pulmonary Rehab from 05/01/2018 in [facility] Cardiac and Pulmonary Rehab  Date  05/01/18  Educator  [facility]  Instruction Review Code  1- Verbalizes Understanding      General Nutrition Guidelines/Fats and Fiber: -Group instruction provided by verbal, written material, models and posters to present the general guidelines for heart healthy nutrition. Gives an explanation and review of dietary fats and fiber.   Controlling  Sodium/Reading Food Labels: -Group verbal and written material supporting the discussion of sodium use in heart healthy nutrition. Review and explanation with models, verbal and written materials for utilization of the food label.   Exercise Physiology & General Exercise Guidelines: - Group verbal and written instruction with models to review the exercise physiology of the cardiovascular system and associated critical values. Provides general exercise guidelines with specific guidelines to those with heart or lung disease.    Aerobic Exercise & Resistance Training: - Gives group verbal and written instruction on the various components of exercise. Focuses on aerobic and resistive training programs and the benefits of this training and how to safely progress through these programs.   Flexibility, Balance, Mind/Body Relaxation: Provides group verbal/written instruction on the benefits of flexibility and balance training, including mind/body exercise modes such as yoga, pilates and tai chi.  Demonstration and skill practice provided.   Stress and Anxiety: - Provides group verbal and written instruction about the health risks of elevated stress and causes of high stress.  Discuss the correlation between heart/lung disease and anxiety and treatment options. Review healthy ways to manage with stress and anxiety.   Depression: - Provides group verbal and written instruction on the correlation between heart/lung disease and depressed mood, treatment options, and the stigmas associated with seeking treatment.   Exercise & Equipment Safety: - Individual verbal instruction and demonstration of equipment use and safety with use of the equipment.   Pulmonary Rehab from 05/01/2018 in [facility] Cardiac and Pulmonary Rehab  Date  05/01/18  Educator  [facility]  Instruction Review Code  1- Verbalizes Understanding      Infection Prevention: - Provides verbal and written material to individual with discussion of  infection control including proper hand washing and proper equipment cleaning during exercise session.   Pulmonary Rehab from 05/01/2018 in [facility] Cardiac and Pulmonary Rehab  Date  05/01/18  Educator  [facility]  Instruction Review Code  1- Verbalizes Understanding      Falls Prevention: - Provides verbal and written material to individual with discussion of falls prevention and safety.   Pulmonary Rehab from 05/01/2018 in [facility] Cardiac and Pulmonary Rehab  Date  05/01/18  Educator  [facility]  Instruction Review Code  1- Verbalizes Understanding      Diabetes: - Individual verbal and written instruction to review signs/symptoms of diabetes, desired ranges of glucose level fasting, after meals and with exercise. Advice that pre and post exercise glucose checks will be done for 3 sessions at entry of program.   Chronic Lung Diseases: - Group verbal and written instruction to review updates, respiratory medications, advancements in procedures and treatments. Discuss use of supplemental oxygen including available portable oxygen systems, continuous and intermittent flow rates, concentrators, personal use and safety guidelines. Review proper use of inhaler and spacers. Provide informative websites for self-education.    Energy Conservation: - Provide group verbal and written instruction for methods to conserve energy, plan and organize activities. Instruct on pacing techniques, use of adaptive equipment and posture/positioning to relieve shortness of breath.   Triggers and Exacerbations: - Group verbal and written instruction to review types of environmental triggers and ways to prevent exacerbations. Discuss weather changes, air quality and the benefits of nasal  washing. Review warning signs and symptoms to help prevent infections. Discuss techniques for effective airway clearance, coughing, and vibrations.   AED/CPR: - Group verbal and written instruction with the use of models to demonstrate the basic  use of the AED with the basic ABC's of resuscitation.   Anatomy and Physiology of the Lungs: - Group verbal and written instruction with the use of models to provide basic lung anatomy and physiology related to function, structure and complications of lung disease.   Anatomy & Physiology of the Heart: - Group verbal and written instruction and models provide basic cardiac anatomy and physiology, with the coronary electrical and arterial systems. Review of Valvular disease and Heart Failure   Cardiac Medications: - Group verbal and written instruction to review commonly prescribed medications for heart disease. Reviews the medication, class of the drug, and side effects.   Know Your Numbers and Risk Factors: -Group verbal and written instruction about important numbers in your health.  Discussion of what are risk factors and how they play a role in the disease process.  Review of Cholesterol, Blood Pressure, Diabetes, and BMI and the role they play in your overall health.   Sleep Hygiene: -Provides group verbal and written instruction about how sleep can affect your health.  Define sleep hygiene, discuss sleep cycles and impact of sleep habits. Review good sleep hygiene tips.    Other: -Provides group and verbal instruction on various topics (see comments)    Knowledge Questionnaire Score: Knowledge Questionnaire Score - 05/01/18 1528      Knowledge Questionnaire Score   Pre Score  15/18 reviewed with patient        Core Components/Risk Factors/Patient Goals at Admission: Personal Goals and Risk Factors at Admission - 05/01/18 1530      Core Components/Risk Factors/Patient Goals on Admission    Weight Management  Yes;Obesity;Weight Loss    Intervention  Weight Management: Develop a combined nutrition and exercise program designed to reach desired caloric intake, while maintaining appropriate intake of nutrient and fiber, sodium and fats, and appropriate energy expenditure  required for the weight goal.;Weight Management: Provide education and appropriate resources to help participant work on and attain dietary goals.;Weight Management/Obesity: Establish reasonable short term and long term weight goals.;Obesity: Provide education and appropriate resources to help participant work on and attain dietary goals.    Admit Weight  232 lb (105.2 kg)    Goal Weight: Short Term  227 lb (103 kg)    Goal Weight: Long Term  180 lb (81.6 kg)    Expected Outcomes  Short Term: Continue to assess and modify interventions until short term weight is achieved;Long Term: Adherence to nutrition and physical activity/exercise program aimed toward attainment of established weight goal;Weight Maintenance: Understanding of the daily nutrition guidelines, which includes 25-35% calories from fat, 7% or less cal from saturated fats, less than 223m cholesterol, less than 1.5gm of sodium, & 5 or more servings of fruits and vegetables daily;Weight Loss: Understanding of general recommendations for a balanced deficit meal plan, which promotes 1-2 lb weight loss per week and includes a negative energy balance of [phone] kcal/d;Understanding recommendations for meals to include 15-35% energy as protein, 25-35% energy from fat, 35-60% energy from carbohydrates, less than 2069mof dietary cholesterol, 20-35 gm of total fiber daily;Understanding of distribution of calorie intake throughout the day with the consumption of 4-5 meals/snacks    Improve shortness of breath with ADL's  Yes    Intervention  Provide education, individualized exercise plan  and daily activity instruction to help decrease symptoms of SOB with activities of daily living.    Expected Outcomes  Short Term: Improve cardiorespiratory fitness to achieve a reduction of symptoms when performing ADLs;Long Term: Be able to perform more ADLs without symptoms or delay the onset of symptoms    Heart Failure  Yes    Intervention  Provide a combined  exercise and nutrition program that is supplemented with education, support and counseling about heart failure. Directed toward relieving symptoms such as shortness of breath, decreased exercise tolerance, and extremity edema.    Expected Outcomes  Improve functional capacity of life;Short term: Attendance in program 2-3 days a week with increased exercise capacity. Reported lower sodium intake. Reported increased fruit and vegetable intake. Reports medication compliance.;Short term: Daily weights obtained and reported for increase. Utilizing diuretic protocols set by physician.;Long term: Adoption of self-care skills and reduction of barriers for early signs and symptoms recognition and intervention leading to self-care maintenance.    Hypertension  Yes    Intervention  Provide education on lifestyle modifcations including regular physical activity/exercise, weight management, moderate sodium restriction and increased consumption of fresh fruit, vegetables, and low fat dairy, alcohol moderation, and smoking cessation.;Monitor prescription use compliance.    Expected Outcomes  Short Term: Continued assessment and intervention until BP is < 140/7m HG in hypertensive participants. < 130/822mHG in hypertensive participants with diabetes, heart failure or chronic kidney disease.;Long Term: Maintenance of blood pressure at goal levels.       Core Components/Risk Factors/Patient Goals Review:    Core Components/Risk Factors/Patient Goals at Discharge (Final Review):    ITP Comments: ITP Comments    Row Name 05/01/18 1536 05/08/18 1530         ITP Comments  Medical Evaluation completed. Chart sent for review and changes to Dr. MaEmily Filbertirector of LuSt. CharlesDiagnosis can be found in [facility] encounter 07/13/16   30 day review completed. ITP sent to Dr. MaEmily Filbertirector of LuEdmonsonContinue with ITP unless changes are made by physician         Comments: 30 day review

## 2018-05-10 ENCOUNTER — Encounter: Payer: 59 | Admitting: *Deleted

## 2018-05-10 DIAGNOSIS — I272 Pulmonary hypertension, unspecified: Secondary | ICD-10-CM | POA: Diagnosis not present

## 2018-05-10 NOTE — Progress Notes (Signed)
Daily Session Note  Patient Details  Name: Regina White MRN: [id number] Date of Birth: June 05, 1956 Referring Provider:     Pulmonary Rehab from 05/01/2018 in [facility] Cardiac and Pulmonary Rehab  Referring Provider  Nolon Stalls MD      Encounter Date: 05/10/2018  Check In: Session Check In - 05/10/18 1134      Check-In   Location  [facility]-Cardiac & Pulmonary Rehab    Staff Present  Renita Papa, RN BSN;Lakenzie Mcclafferty Luan Pulling, MA, RCEP, CCRP, Exercise Physiologist;Joseph Flavia Shipper    Supervising physician immediately available to respond to emergencies  [facility] immediately available ER MD    Physician(s)  Drs. Jimmye Norman and [place]    Medication changes reported      No    Fall or balance concerns reported     No    Warm-up and Cool-down  Performed as group-led Higher education careers adviser Performed  Yes    VAD Patient?  No    PAD/SET Patient?  No      Pain Assessment   Currently in Pain?  No/denies        Exercise Prescription Changes - 05/09/18 1300      Response to Exercise   Blood Pressure (Admit)  118/68    Blood Pressure (Exercise)  106/68    Blood Pressure (Exit)  120/56    Heart Rate (Admit)  77 bpm    Heart Rate (Exercise)  82 bpm    Heart Rate (Exit)  90 bpm    Oxygen Saturation (Admit)  92 %    Oxygen Saturation (Exercise)  89 %    Oxygen Saturation (Exit)  97 %    Rating of Perceived Exertion (Exercise)  13    Perceived Dyspnea (Exercise)  0    Symptoms  fatigue    Comments  first full day of exercise    Duration  Progress to 45 minutes of aerobic exercise without signs/symptoms of physical distress    Intensity  THRR unchanged      Progression   Progression  Continue to progress workloads to maintain intensity without signs/symptoms of physical distress.    Average METs  1.4      Resistance Training   Training Prescription  Yes    Weight  2 lbs    Reps  10-15      Oxygen   Oxygen  Continuous    Liters  4 PRN staff error      Arm Ergometer   Level  1    RPM  15    Minutes  15    METs  1.2      T5 Nustep   Level  1    SPM  77    Minutes  15    METs  1.3      Biostep-RELP   Level  1    SPM  83    Minutes  15       Social History   Tobacco Use  Smoking Status Former Smoker  . Packs/day: 1.00  . Years: 30.00  . Pack years: 30.00  . Types: Cigarettes  . Last attempt to quit: 06/09/1999  . Years since quitting: 18.9  Smokeless Tobacco Never Used    Goals Met:  Proper associated with RPD/PD & O2 Sat Exercise tolerated well Queuing for purse lip breathing No report of cardiac concerns or symptoms Strength training completed today  Goals Unmet:  Not Applicable  Comments: Pt able to follow exercise prescription today  without complaint.  Will continue to monitor for progression.    Dr. Emily Filbert is Medical Director for [place] and [facility].

## 2018-05-15 DIAGNOSIS — I272 Pulmonary hypertension, unspecified: Secondary | ICD-10-CM

## 2018-05-15 NOTE — Progress Notes (Signed)
Daily Session Note  Patient Details  Name: MAROLYN URSCHEL MRN: [id number] Date of Birth: January 19, 1956 Referring Provider:     Pulmonary Rehab from 05/01/2018 in [facility] Cardiac and Pulmonary Rehab  Referring Provider  Nolon Stalls MD      Encounter Date: 05/15/2018  Check In: Session Check In - 05/15/18 1139      Check-In   Location  [facility]-Cardiac & Pulmonary Rehab    Staff Present  Justin Mend RCP,RRT,BSRT;Laureen Owens Shark, BS, RRT, Respiratory Bertis Ruddy, BS, ACSM CEP, Exercise Physiologist    Supervising physician immediately available to respond to emergencies  [facility] immediately available ER MD    Physician(s)  Dr. Wynona Neat and Cinda Quest    Medication changes reported      No    Fall or balance concerns reported     No    Tobacco Cessation  No Change    Warm-up and Cool-down  Performed as group-led instruction    Resistance Training Performed  Yes    VAD Patient?  No    PAD/SET Patient?  No      Pain Assessment   Currently in Pain?  No/denies    Multiple Pain Sites  No          Social History   Tobacco Use  Smoking Status Former Smoker  . Packs/day: 1.00  . Years: 30.00  . Pack years: 30.00  . Types: Cigarettes  . Last attempt to quit: 06/09/1999  . Years since quitting: 18.9  Smokeless Tobacco Never Used    Goals Met:  Proper associated with RPD/PD & O2 Sat Independence with exercise equipment Using PLB without cueing & demonstrates good technique Exercise tolerated well No report of cardiac concerns or symptoms Strength training completed today  Goals Unmet:  Not Applicable  Comments: Pt able to follow exercise prescription today without complaint.  Will continue to monitor for progression.    Dr. Emily Filbert is Medical Director for [place] and [facility].

## 2018-05-17 DIAGNOSIS — I272 Pulmonary hypertension, unspecified: Secondary | ICD-10-CM

## 2018-05-17 NOTE — Progress Notes (Signed)
Daily Session Note  Patient Details  Name: Regina White MRN: [id number] Date of Birth: December 31, 1955 Referring Provider:     Pulmonary Rehab from 05/01/2018 in [facility] Cardiac and Pulmonary Rehab  Referring Provider  Nolon Stalls MD      Encounter Date: 05/17/2018  Check In: Session Check In - 05/17/18 1130      Check-In   Location  [facility]-Cardiac & Pulmonary Rehab    Staff Present  Justin Mend RCP,RRT,BSRT;Nana Addai, RN BSN;Meredith Sherryll Burger, RN BSN    Supervising physician immediately available to respond to emergencies  [facility] immediately available ER MD    Physician(s)  Dr. Archie Balboa and Joni Fears    Medication changes reported      No    Fall or balance concerns reported     No    Warm-up and Cool-down  Performed as group-led instruction    Resistance Training Performed  Yes    VAD Patient?  No      Pain Assessment   Currently in Pain?  No/denies          Social History   Tobacco Use  Smoking Status Former Smoker  . Packs/day: 1.00  . Years: 30.00  . Pack years: 30.00  . Types: Cigarettes  . Last attempt to quit: 06/09/1999  . Years since quitting: 18.9  Smokeless Tobacco Never Used    Goals Met:  Independence with exercise equipment Exercise tolerated well No report of cardiac concerns or symptoms Strength training completed today  Goals Unmet:  Not Applicable  Comments: Pt able to follow exercise prescription today without complaint.  Will continue to monitor for progression.   Dr. Emily Filbert is Medical Director for [place] and [facility].

## 2018-05-19 DIAGNOSIS — I272 Pulmonary hypertension, unspecified: Secondary | ICD-10-CM | POA: Diagnosis not present

## 2018-05-19 NOTE — Progress Notes (Signed)
Daily Session Note  Patient Details  Name: ARRIN ISHLER MRN: [id number] Date of Birth: 1955/11/30 Referring Provider:     Pulmonary Rehab from 05/01/2018 in [facility] Cardiac and Pulmonary Rehab  Referring Provider  Nolon Stalls MD      Encounter Date: 05/19/2018  Check In: Session Check In - 05/19/18 1143      Check-In   Location  [facility]-Cardiac & Pulmonary Rehab    Staff Present  Justin Mend RCP,RRT,BSRT;Meredith Sherryll Burger, RN Vickki Hearing, BA, ACSM CEP, Exercise Physiologist    Supervising physician immediately available to respond to emergencies  [facility] immediately available ER MD    Physician(s)  Dr. Joni Fears and Corky Downs    Medication changes reported      No    Fall or balance concerns reported     No    Tobacco Cessation  No Change    Warm-up and Cool-down  Performed as group-led instruction    Resistance Training Performed  Yes    VAD Patient?  No      Pain Assessment   Currently in Pain?  No/denies          Social History   Tobacco Use  Smoking Status Former Smoker  . Packs/day: 1.00  . Years: 30.00  . Pack years: 30.00  . Types: Cigarettes  . Last attempt to quit: 06/09/1999  . Years since quitting: 18.9  Smokeless Tobacco Never Used    Goals Met:  Proper associated with RPD/PD & O2 Sat Independence with exercise equipment Using PLB without cueing & demonstrates good technique Exercise tolerated well No report of cardiac concerns or symptoms Strength training completed today  Goals Unmet:  Not Applicable  Comments: Pt able to follow exercise prescription today without complaint.  Will continue to monitor for progression.    Dr. Emily Filbert is Medical Director for [place] and [facility].

## 2018-05-22 DIAGNOSIS — I272 Pulmonary hypertension, unspecified: Secondary | ICD-10-CM

## 2018-05-22 NOTE — Progress Notes (Signed)
Daily Session Note  Patient Details  Name: Regina White MRN: [id number] Date of Birth: [birth year] Referring Provider:     Pulmonary Rehab from 05/01/2018 in [facility] Cardiac and Pulmonary Rehab  Referring Provider  Nolon Stalls MD      Encounter Date: 05/22/2018  Check In: Session Check In - 05/22/18 1131      Check-In   Location  [facility]-Cardiac & Pulmonary Rehab    Staff Present  Justin Mend RCP,RRT,BSRT;Amanda Oletta Darter, BA, ACSM CEP, Exercise Physiologist;Kelly Amedeo Plenty, BS, ACSM CEP, Exercise Physiologist    Supervising physician immediately available to respond to emergencies  [facility] immediately available ER MD    Physician(s)  Dr. Jimmye Norman and Corky Downs    Medication changes reported      No    Fall or balance concerns reported     No    Warm-up and Cool-down  Performed as group-led instruction    Resistance Training Performed  Yes    VAD Patient?  No      Pain Assessment   Currently in Pain?  No/denies          Social History   Tobacco Use  Smoking Status Former Smoker  . Packs/day: 1.00  . Years: 30.00  . Pack years: 30.00  . Types: Cigarettes  . Last attempt to quit: 06/09/1999  . Years since quitting: 18.9  Smokeless Tobacco Never Used    Goals Met:  Independence with exercise equipment Exercise tolerated well No report of cardiac concerns or symptoms Strength training completed today  Goals Unmet:  Not Applicable  Comments: Pt able to follow exercise prescription today without complaint.  Will continue to monitor for progression.   Dr. Emily Filbert is Medical Director for [place] and [facility].

## 2018-05-24 ENCOUNTER — Encounter: Payer: 59 | Admitting: *Deleted

## 2018-05-24 DIAGNOSIS — I272 Pulmonary hypertension, unspecified: Secondary | ICD-10-CM | POA: Diagnosis not present

## 2018-05-24 NOTE — Progress Notes (Signed)
Daily Session Note  Patient Details  Name: Regina White MRN: [id number] Date of Birth: 1956/04/24 Referring Provider:     Pulmonary Rehab from 05/01/2018 in [facility] Cardiac and Pulmonary Rehab  Referring Provider  Nolon Stalls MD      Encounter Date: 05/24/2018  Check In: Session Check In - 05/24/18 1138      Check-In   Location  [facility]-Cardiac & Pulmonary Rehab    Staff Present  Justin Mend Lorre Nick, [place], RCEP, CCRP, Exercise Physiologist;Meredith Sherryll Burger, RN BSN    Supervising physician immediately available to respond to emergencies  [facility] immediately available ER MD    Physician(s)  Dr. Jacqualine Code and Jimmye Norman     Medication changes reported      No    Fall or balance concerns reported     No    Tobacco Cessation  No Change    Warm-up and Cool-down  Performed as group-led instruction    Resistance Training Performed  Yes    VAD Patient?  No      Pain Assessment   Currently in Pain?  No/denies        Exercise Prescription Changes - 05/23/18 1500      Response to Exercise   Blood Pressure (Admit)  120/80    Blood Pressure (Exit)  114/72    Heart Rate (Admit)  88 bpm    Heart Rate (Exercise)  93 bpm    Heart Rate (Exit)  84 bpm    Oxygen Saturation (Admit)  83 %    Oxygen Saturation (Exercise)  91 %    Oxygen Saturation (Exit)  97 %    Rating of Perceived Exertion (Exercise)  15    Perceived Dyspnea (Exercise)  0    Symptoms  fatigue    Duration  Progress to 45 minutes of aerobic exercise without signs/symptoms of physical distress    Intensity  THRR unchanged      Progression   Progression  Continue to progress workloads to maintain intensity without signs/symptoms of physical distress.    Average METs  1.7      Resistance Training   Training Prescription  Yes    Weight  2 lbs    Reps  10-15      Interval Training   Interval Training  No      Oxygen   Oxygen  Continuous    Liters  2      NuStep   Level  1    Minutes  15    METs  1.9      Arm Ergometer   Level  2    Minutes  15    METs  1.4      T5 Nustep   Level  1    Minutes  15    METs  1.8       Social History   Tobacco Use  Smoking Status Former Smoker  . Packs/day: 1.00  . Years: 30.00  . Pack years: 30.00  . Types: Cigarettes  . Last attempt to quit: 06/09/1999  . Years since quitting: 18.9  Smokeless Tobacco Never Used    Goals Met:  Proper associated with RPD/PD & O2 Sat Independence with exercise equipment Using PLB without cueing & demonstrates good technique Exercise tolerated well No report of cardiac concerns or symptoms Strength training completed today  Goals Unmet:  Not Applicable  Comments: Pt able to follow exercise prescription today without complaint.  Will continue to monitor for progression.  Dr. Emily Filbert is Medical Director for [place] and [facility].

## 2018-06-02 ENCOUNTER — Other Ambulatory Visit: Payer: Self-pay

## 2018-06-02 ENCOUNTER — Inpatient Hospital Stay
Admit: 2018-06-02 | Discharge: 2018-06-02 | Disposition: A | Discharge status: Home / Self Care | Payer: Medicare Other | Attending: Internal Medicine | Admitting: Internal Medicine

## 2018-06-02 ENCOUNTER — Inpatient Hospital Stay: Payer: Self-pay

## 2018-06-02 ENCOUNTER — Emergency Department: Payer: Medicare Other

## 2018-06-02 ENCOUNTER — Inpatient Hospital Stay: Payer: Medicare Other

## 2018-06-02 ENCOUNTER — Encounter: Payer: Self-pay | Admitting: Emergency Medicine

## 2018-06-02 ENCOUNTER — Inpatient Hospital Stay
Admission: EM | Admit: 2018-06-02 | Discharge: 2018-06-02 | DRG: 871 | Disposition: A | Discharge status: Other Acute Inpatient Hospital | Payer: Medicare Other | Attending: Internal Medicine | Admitting: Internal Medicine

## 2018-06-02 DIAGNOSIS — N390 Urinary tract infection, site not specified: Secondary | ICD-10-CM | POA: Diagnosis present

## 2018-06-02 DIAGNOSIS — J189 Pneumonia, unspecified organism: Secondary | ICD-10-CM | POA: Diagnosis present

## 2018-06-02 DIAGNOSIS — N189 Chronic kidney disease, unspecified: Secondary | ICD-10-CM | POA: Diagnosis present

## 2018-06-02 DIAGNOSIS — M549 Dorsalgia, unspecified: Secondary | ICD-10-CM | POA: Diagnosis present

## 2018-06-02 DIAGNOSIS — I248 Other forms of acute ischemic heart disease: Secondary | ICD-10-CM | POA: Diagnosis present

## 2018-06-02 DIAGNOSIS — I2721 Secondary pulmonary arterial hypertension: Secondary | ICD-10-CM | POA: Diagnosis present

## 2018-06-02 DIAGNOSIS — I2510 Atherosclerotic heart disease of native coronary artery without angina pectoris: Secondary | ICD-10-CM | POA: Diagnosis present

## 2018-06-02 DIAGNOSIS — G894 Chronic pain syndrome: Secondary | ICD-10-CM | POA: Diagnosis present

## 2018-06-02 DIAGNOSIS — Z881 Allergy status to other antibiotic agents status: Secondary | ICD-10-CM

## 2018-06-02 DIAGNOSIS — Z79899 Other long term (current) drug therapy: Secondary | ICD-10-CM

## 2018-06-02 DIAGNOSIS — E039 Hypothyroidism, unspecified: Secondary | ICD-10-CM | POA: Diagnosis present

## 2018-06-02 DIAGNOSIS — I872 Venous insufficiency (chronic) (peripheral): Secondary | ICD-10-CM | POA: Diagnosis present

## 2018-06-02 DIAGNOSIS — N179 Acute kidney failure, unspecified: Secondary | ICD-10-CM

## 2018-06-02 DIAGNOSIS — T508X5A Adverse effect of diagnostic agents, initial encounter: Secondary | ICD-10-CM | POA: Diagnosis present

## 2018-06-02 DIAGNOSIS — N170 Acute kidney failure with tubular necrosis: Secondary | ICD-10-CM | POA: Diagnosis present

## 2018-06-02 DIAGNOSIS — Z79891 Long term (current) use of opiate analgesic: Secondary | ICD-10-CM

## 2018-06-02 DIAGNOSIS — I4510 Unspecified right bundle-branch block: Secondary | ICD-10-CM | POA: Diagnosis present

## 2018-06-02 DIAGNOSIS — R6521 Severe sepsis with septic shock: Secondary | ICD-10-CM | POA: Diagnosis present

## 2018-06-02 DIAGNOSIS — Z884 Allergy status to anesthetic agent status: Secondary | ICD-10-CM

## 2018-06-02 DIAGNOSIS — G4733 Obstructive sleep apnea (adult) (pediatric): Secondary | ICD-10-CM | POA: Diagnosis present

## 2018-06-02 DIAGNOSIS — A419 Sepsis, unspecified organism: Secondary | ICD-10-CM | POA: Diagnosis present

## 2018-06-02 DIAGNOSIS — I130 Hypertensive heart and chronic kidney disease with heart failure and stage 1 through stage 4 chronic kidney disease, or unspecified chronic kidney disease: Secondary | ICD-10-CM | POA: Diagnosis present

## 2018-06-02 DIAGNOSIS — J9601 Acute respiratory failure with hypoxia: Secondary | ICD-10-CM | POA: Diagnosis present

## 2018-06-02 DIAGNOSIS — Z86718 Personal history of other venous thrombosis and embolism: Secondary | ICD-10-CM

## 2018-06-02 DIAGNOSIS — Z87891 Personal history of nicotine dependence: Secondary | ICD-10-CM

## 2018-06-02 DIAGNOSIS — Z882 Allergy status to sulfonamides status: Secondary | ICD-10-CM

## 2018-06-02 DIAGNOSIS — I5023 Acute on chronic systolic (congestive) heart failure: Secondary | ICD-10-CM | POA: Diagnosis present

## 2018-06-02 DIAGNOSIS — J440 Chronic obstructive pulmonary disease with acute lower respiratory infection: Secondary | ICD-10-CM | POA: Diagnosis present

## 2018-06-02 DIAGNOSIS — R221 Localized swelling, mass and lump, neck: Secondary | ICD-10-CM

## 2018-06-02 HISTORY — DX: Other chronic pain: G89.29

## 2018-06-02 HISTORY — DX: Pulmonary hypertension, unspecified: I27.20

## 2018-06-02 HISTORY — DX: Dorsalgia, unspecified: M54.9

## 2018-06-02 HISTORY — DX: Heart failure, unspecified: I50.9

## 2018-06-02 HISTORY — DX: Unspecified asthma, uncomplicated: J45.909

## 2018-06-02 LAB — URINALYSIS, ROUTINE W REFLEX MICROSCOPIC
BILIRUBIN URINE: NEGATIVE
GLUCOSE, UA: NEGATIVE mg/dL
KETONES UR: NEGATIVE mg/dL
NITRITE: NEGATIVE
Protein, ur: 100 mg/dL — AB
SPECIFIC GRAVITY, URINE: 1.023 (ref 1.005–1.030)
Squamous Epithelial / LPF: NONE SEEN (ref 0–5)
WBC, UA: >50 WBC/hpf — ABNORMAL HIGH (ref 0–5)
pH: 5 (ref 5.0–8.0)

## 2018-06-02 LAB — CBC WITH DIFFERENTIAL/PLATELET
BASOS ABS: 0 10*3/uL (ref 0–0.1)
BASOS PCT: 0 %
Eosinophils Absolute: 0 10*3/uL (ref 0–0.7)
Eosinophils Relative: 0 %
HCT: 39.5 % (ref 35.0–47.0)
HEMOGLOBIN: 12.7 g/dL (ref 12.0–16.0)
Lymphocytes Relative: 5 %
Lymphs Abs: 0.4 10*3/uL — ABNORMAL LOW (ref 1.0–3.6)
MCH: 26.8 pg (ref 26.0–34.0)
MCHC: 32.2 g/dL (ref 32.0–36.0)
MCV: 83.1 fL (ref 80.0–100.0)
Monocytes Absolute: 0.4 10*3/uL (ref 0.2–0.9)
Monocytes Relative: 7 %
NEUTROS ABS: 5.9 10*3/uL (ref 1.4–6.5)
NEUTROS PCT: 88 %
Platelets: 168 10*3/uL (ref 150–440)
RBC: 4.75 MIL/uL (ref 3.80–5.20)
RDW: 20.5 % — ABNORMAL HIGH (ref 11.5–14.5)
WBC: 6.7 10*3/uL (ref 3.6–11.0)

## 2018-06-02 LAB — BLOOD GAS, VENOUS
ACID-BASE DEFICIT: 16.5 mmol/L — AB (ref 0.0–2.0)
BICARBONATE: 12 mmol/L — AB (ref 20.0–28.0)
O2 Saturation: 75.4 %
PCO2 VEN: 37 mmHg — AB (ref 44.0–60.0)
PH VEN: 7.12 — AB (ref 7.250–7.430)
Patient temperature: 37
pO2, Ven: 55 mmHg — ABNORMAL HIGH (ref 32.0–45.0)

## 2018-06-02 LAB — PROCALCITONIN: Procalcitonin: 0.34 ng/mL

## 2018-06-02 LAB — BASIC METABOLIC PANEL
ANION GAP: 17 — AB (ref 5–15)
BUN: 106 mg/dL — ABNORMAL HIGH (ref 8–23)
CHLORIDE: 102 mmol/L (ref 98–111)
CO2: 17 mmol/L — AB (ref 22–32)
Calcium: 6.4 mg/dL — CL (ref 8.9–10.3)
Creatinine, Ser: 6.02 mg/dL — ABNORMAL HIGH (ref 0.44–1.00)
GFR calc non Af Amer: 7 mL/min — ABNORMAL LOW (ref >60)
GFR, EST AFRICAN AMERICAN: 8 mL/min — AB (ref >60)
Glucose, Bld: 149 mg/dL — ABNORMAL HIGH (ref 70–99)
Potassium: 4.8 mmol/L (ref 3.5–5.1)
Sodium: 136 mmol/L (ref 135–145)

## 2018-06-02 LAB — ECHOCARDIOGRAM COMPLETE
HEIGHTINCHES: 61 in
Weight: 3840 oz

## 2018-06-02 LAB — MRSA PCR SCREENING: MRSA BY PCR: NEGATIVE

## 2018-06-02 LAB — BRAIN NATRIURETIC PEPTIDE: B NATRIURETIC PEPTIDE 5: 1569 pg/mL — AB (ref 0.0–100.0)

## 2018-06-02 LAB — GLUCOSE, CAPILLARY: GLUCOSE-CAPILLARY: 186 mg/dL — AB (ref 70–99)

## 2018-06-02 LAB — LACTIC ACID, PLASMA: LACTIC ACID, VENOUS: 1 mmol/L (ref 0.5–1.9)

## 2018-06-02 LAB — TROPONIN I: Troponin I: 0.28 ng/mL (ref <0.03)

## 2018-06-02 MED ORDER — SODIUM CHLORIDE 0.9% FLUSH: 10.0000 mL | INTRAVENOUS | Status: DC | PRN

## 2018-06-02 MED ORDER — VANCOMYCIN HCL IN DEXTROSE 1-5 GM/200ML-% IV SOLN
1000.0000 mg | Freq: Once | INTRAVENOUS | Status: AC
Start: 2018-06-02 — End: 2018-06-02
  Administered 2018-06-02: 1000 mg via INTRAVENOUS
  Filled 2018-06-02: qty 200

## 2018-06-02 MED ORDER — IPRATROPIUM-ALBUTEROL 0.5-2.5 (3) MG/3ML IN SOLN
3.0000 mL | Freq: Once | RESPIRATORY_TRACT | Status: AC
  Administered 2018-06-02: 3 mL via RESPIRATORY_TRACT

## 2018-06-02 MED ORDER — MONTELUKAST SODIUM 10 MG PO TABS: 10.0000 mg | ORAL_TABLET | Freq: Every day | ORAL | Status: DC

## 2018-06-02 MED ORDER — IPRATROPIUM-ALBUTEROL 0.5-2.5 (3) MG/3ML IN SOLN
RESPIRATORY_TRACT | Status: AC
  Administered 2018-06-02: 3 mL via RESPIRATORY_TRACT
  Filled 2018-06-02: qty 9

## 2018-06-02 MED ORDER — ACETAMINOPHEN 650 MG RE SUPP: 650.0000 mg | Freq: Four times a day (QID) | RECTAL | Status: DC | PRN

## 2018-06-02 MED ORDER — HEPARIN SODIUM (PORCINE) 5000 UNIT/ML IJ SOLN: 5000.0000 [IU] | Freq: Three times a day (TID) | INTRAMUSCULAR | Status: DC

## 2018-06-02 MED ORDER — IOPAMIDOL (ISOVUE-370) INJECTION 76%
75.0000 mL | Freq: Once | INTRAVENOUS | Status: AC | PRN
  Administered 2018-06-02: 75 mL via INTRAVENOUS

## 2018-06-02 MED ORDER — NOREPINEPHRINE 4 MG/250ML-% IV SOLN
INTRAVENOUS | Status: AC
  Administered 2018-06-02: 2 ug/min via INTRAVENOUS
  Filled 2018-06-02: qty 250

## 2018-06-02 MED ORDER — VANCOMYCIN VARIABLE DOSE PER UNSTABLE RENAL FUNCTION (PHARMACIST DOSING): Status: DC

## 2018-06-02 MED ORDER — SODIUM CHLORIDE 0.9 % IV SOLN
1.0000 g | Freq: Once | INTRAVENOUS | Status: AC
  Administered 2018-06-02: 1 g via INTRAVENOUS
  Filled 2018-06-02: qty 1

## 2018-06-02 MED ORDER — SODIUM CHLORIDE 0.9 % IV BOLUS
1000.0000 mL | Freq: Once | INTRAVENOUS | Status: AC
  Administered 2018-06-02: 1000 mL via INTRAVENOUS

## 2018-06-02 MED ORDER — ACETAMINOPHEN 325 MG PO TABS: 650.0000 mg | ORAL_TABLET | Freq: Four times a day (QID) | ORAL | Status: DC | PRN

## 2018-06-02 MED ORDER — SODIUM CHLORIDE 0.9 % IV SOLN
1.0000 g | Freq: Once | INTRAVENOUS | Status: AC
  Administered 2018-06-02: 1 g via INTRAVENOUS
  Filled 2018-06-02: qty 10

## 2018-06-02 MED ORDER — NOREPINEPHRINE 4 MG/250ML-% IV SOLN
0.0000 ug/min | Freq: Once | INTRAVENOUS | Status: AC
  Administered 2018-06-02: 2.667 ug/min via INTRAVENOUS
  Administered 2018-06-02: 2 ug/min via INTRAVENOUS

## 2018-06-02 MED ORDER — SODIUM CHLORIDE 0.9 % IV SOLN
1.0000 g | INTRAVENOUS | Status: DC
  Filled 2018-06-02: qty 1

## 2018-06-02 MED ORDER — HEPARIN SODIUM (PORCINE) 5000 UNIT/ML IJ SOLN: 5000.0000 [IU] | Freq: Two times a day (BID) | INTRAMUSCULAR | Status: DC

## 2018-06-02 MED ORDER — ZAFIRLUKAST 20 MG PO TABS: 20.0000 mg | ORAL_TABLET | Freq: Every day | ORAL | Status: DC

## 2018-06-02 MED ORDER — LEVOTHYROXINE SODIUM 50 MCG PO TABS: 200.0000 ug | ORAL_TABLET | Freq: Every day | ORAL | Status: DC

## 2018-06-02 MED ORDER — VANCOMYCIN HCL 500 MG IV SOLR
500.0000 mg | Freq: Once | INTRAVENOUS | Status: AC
  Administered 2018-06-02: 500 mg via INTRAVENOUS
  Filled 2018-06-02 (×2): qty 500

## 2018-06-02 MED ORDER — NOREPINEPHRINE 4 MG/250ML-% IV SOLN
INTRAVENOUS | Status: AC
  Administered 2018-06-02: 2.667 ug/min via INTRAVENOUS
  Filled 2018-06-02: qty 250

## 2018-06-02 MED ORDER — SODIUM CHLORIDE 0.9% FLUSH: 10.0000 mL | Freq: Two times a day (BID) | INTRAVENOUS | Status: DC

## 2018-06-02 NOTE — ED Notes (Signed)
Report to Brittany, RN.

## 2018-06-02 NOTE — Progress Notes (Signed)
Report given over the telephone to Elmwood ParkPaul, RN [facility] and Jesusita Okaan, RN (transport nurse).  This RN made patient's son aware [facility] the [facility].

## 2018-06-02 NOTE — H&P (Signed)
[facility] [place] [facility]   PATIENT NAME: Regina CharityCathleen White    MR#:  [id number]  DATE OF BIRTH:  [birth year]  DATE OF ADMISSION:  06/02/2018  PRIMARY CARE PHYSICIAN: Cain SieveKlipstein, Christopher, MD   REQUESTING/REFERRING PHYSICIAN: Dr Cecil Cobbsaroline Veronese  CHIEF COMPLAINT:   Chief Complaint  Patient presents with  . Respiratory Distress    HISTORY OF PRESENT ILLNESS:  Regina SitesCathleen White  is a 62 y.o. female with a known history of pulmonary hypertension presents with respiratory distress.  She has not been feeling well for the last couple days feeling aching some ringing in the ears some stomach pains.  She had some diarrhea a few days ago.  She is coming in with shortness of breath and a little cough.  The patient had a CT scan of the chest with contrast that showed no evidence of pulmonary emboli or aortic dissection but did show enlargement of the pulmonary arteries consistent with pulmonary arterial hypertension and cardiomegaly.  Also had small patchy areas of infiltrate in the right lung.  Mediastinal adenopathy.  Patient on 100% FiO2 on BiPAP.  Patient was found to be in acute kidney injury with a creatinine of 6.02 and this was resulted after CT scan was back.  Patient was hypotensive on presentation.  Of note recent cardiac catheterization did not show any obstructive coronary artery disease did have pulmonary hypertension and an EF around 35%.  PAST MEDICAL HISTORY:   Past Medical History:  Diagnosis Date  . Asthma   . CHF (congestive heart failure) (HCC)   . Chronic back pain   . Pulmonary hypertension (HCC)     PAST SURGICAL HISTORY:   Past Surgical History:  Procedure Laterality Date  . BACK SURGERY      SOCIAL HISTORY:   Social History   Tobacco Use  . Smoking status: Former Smoker    Packs/day: 1.00    Years: 30.00    Pack years: 30.00    Types: Cigarettes    Last attempt to quit: 06/09/1999    Years since quitting: 18.9  .  Smokeless tobacco: Never Used  Substance Use Topics  . Alcohol use: No    FAMILY HISTORY:   Family History  Problem Relation Age of Onset  . Hypertension Mother   . Liver cancer Father     DRUG ALLERGIES:   Allergies  Allergen Reactions  . Erythromycin Nausea And Vomiting and Rash    STOMACH PAIN  . Levofloxacin     Other reaction(s): Other MUSCLE ACHES  . Lidocaine Rash  . Sulfa Antibiotics Rash    REVIEW OF SYSTEMS:  CONSTITUTIONAL: No fever.  Positive for fatigue.  EYES: No blurred or double vision.  EARS, NOSE, AND THROAT: No tinnitus or ear pain. No sore throat RESPIRATORY: Positive for cough and shortness of breath.  No wheezing or hemoptysis.  CARDIOVASCULAR: No chest pain, orthopnea, edema.  GASTROINTESTINAL: Some nausea,  some abdominal pain. No blood in bowel movements couple days ago had some diarrhea.  No vomiting.  GENITOURINARY: No dysuria, hematuria.  ENDOCRINE: No polyuria, nocturia,  HEMATOLOGY: No anemia, easy bruising or bleeding SKIN: No rash or lesion. MUSCULOSKELETAL: Positive chronic pain NEUROLOGIC: No tingling, numbness, weakness.  PSYCHIATRY: No anxiety or depression.   MEDICATIONS AT HOME:   Prior to Admission medications   Medication Sig Start Date End Date Taking? Authorizing Provider  BECONASE AQ 42 MCG/SPRAY nasal spray Place 1 spray into the nose 2 (two) times daily. 05/10/18  Yes [provider]  bisoprolol (ZEBETA) 5 MG tablet Take 2.5 mg by mouth daily. 05/12/18  Yes [provider]  bumetanide (BUMEX) 1 MG tablet Take 2 mg by mouth daily. 05/16/18  Yes [provider]  ENTRESTO 49-51 MG Take 1 tablet by mouth 2 (two) times daily. 05/24/18  Yes [provider]  Eszopiclone 3 MG TABS Take 3 mg by mouth at bedtime.  06/19/10  Yes [provider]  lisinopril (PRINIVIL,ZESTRIL) 20 MG tablet Take 20 mg by mouth daily.   Yes [provider]  montelukast (SINGULAIR) 10 MG tablet Take 10 mg by  mouth at bedtime.   Yes [provider]  morphine (MSIR) 15 MG tablet Take 1 tablet by mouth 3 (three) times daily. 05/24/18  Yes [provider]  spironolactone (ALDACTONE) 25 MG tablet Take 25 mg by mouth daily.  02/17/16 06/02/18 Yes [provider]  SYNTHROID 200 MCG tablet Take 1 tablet by mouth daily. 04/23/18  Yes [provider]  zafirlukast (ACCOLATE) 20 MG tablet Take 20 mg by mouth daily.  08/27/16  Yes [provider]  albuterol (PROVENTIL HFA;VENTOLIN HFA) 108 (90 Base) MCG/ACT inhaler Inhale two puffs every six hours if needed for wheezing 10/12/16   [provider]  cyclobenzaprine (FLEXERIL) 10 MG tablet TAKE 1 TABLET BY MOUTH EVERY 8 HOURS AS NEEDED FOR MUSCLE SPASMS 09/06/16   [provider]  ipratropium (ATROVENT HFA) 17 MCG/ACT inhaler Inhale 2 puffs into the lungs every 4 (four) hours as needed.  02/17/16 02/16/17  [provider]      VITAL SIGNS:  Blood pressure (!) 115/59, pulse 77, temperature 98.8 F (37.1 C), temperature source Oral, resp. rate 13, height 5\' 1"  (1.549 m), weight 108.9 kg (240 lb), SpO2 96 %.  PHYSICAL EXAMINATION:  GENERAL:  62 y.o.-year-old patient lying in the bed with no acute distress.  EYES: Pupils equal, round, reactive to light and accommodation. No scleral icterus. Extraocular muscles intact.  HEENT: Head atraumatic, normocephalic. Oropharynx and nasopharynx clear.  NECK:  Supple, no jugular venous distention. No thyroid enlargement, no tenderness.  LUNGS: decreased breath sounds bilaterally, positive expiratory wheezing, positive rales. No use of accessory muscles of respiration.  CARDIOVASCULAR: S1, S2 normal. No murmurs, rubs, or gallops.  ABDOMEN: Soft, nontender, nondistended. Bowel sounds present. No organomegaly or mass.  EXTREMITIES: 2+ pedal edema, cyanosis, or clubbing.  NEUROLOGIC: Cranial nerves II through XII are intact. Muscle strength 5/5 in all extremities.  Sensation intact. Gait not checked.  PSYCHIATRIC: The patient is alert and oriented x 3.  SKIN: bilateral lower extremity edema and erythema  LABORATORY PANEL:   CBC Recent Labs  Lab 06/02/18 1207  WBC 6.7  HGB 12.7  HCT 39.5  PLT 168   ------------------------------------------------------------------------------------------------------------------  Chemistries  Recent Labs  Lab 06/02/18 1207  NA 136  K 4.8  CL 102  CO2 17*  GLUCOSE 149*  BUN 106*  CREATININE 6.02*  CALCIUM 6.4*   ------------------------------------------------------------------------------------------------------------------  Cardiac Enzymes Recent Labs  Lab 06/02/18 1207  TROPONINI 0.28*   ------------------------------------------------------------------------------------------------------------------  RADIOLOGY:  Ct Angio Chest Pe W Or Wo Contrast  Result Date: 06/02/2018 CLINICAL DATA:  Shortness of breath. EXAM: CT ANGIOGRAPHY CHEST WITH CONTRAST TECHNIQUE: Multidetector CT imaging of the chest was performed using the standard protocol during bolus administration of intravenous contrast. Multiplanar CT image reconstructions and MIPs were obtained to evaluate the vascular anatomy. CONTRAST:  75mL ISOVUE-370 IOPAMIDOL (ISOVUE-370) INJECTION 76% COMPARISON:  Chest x-rays dated 06/02/2018 and 12/12/2010  FINDINGS: Cardiovascular: There are no pulmonary emboli. There is marked enlargement of the pulmonary arteries with dilatation of the right atrium and ventricle, consistent with pulmonary arterial hypertension. There is cardiomegaly. Diffuse prominence of the pulmonary vascularity. Mediastinum/Nodes: There is an enlarged precarinal lymph node measuring 2 cm in diameter. There also small lymph nodes in the right hilum and there is a 2 cm subcarinal lymph node. Minimal residual thyroid tissue. Lungs/Pleura: There are small patchy infiltrates in the right lower lobe laterally and posteriorly. There is also  a tiny patchy area of infiltrate in the right upper lobe. Slight focal atelectasis in the posterior aspect of the left upper lobe. Upper Abdomen: Cholelithiasis.  Left renal atrophy. Musculoskeletal: No acute abnormalities. Review of the MIP images confirms the above findings. IMPRESSION: 1. No evidence of pulmonary emboli or aortic dissection. 2. Marked enlargement of the pulmonary arteries and right side of the heart consistent with pulmonary arterial hypertension. Cardiomegaly. 3. Small patchy areas of infiltrate in the right lung. 4. Mediastinal adenopathy of unknown significance. 5. Cholelithiasis. Electronically Signed   By: Francene Boyers M.D.   On: 06/02/2018 13:10   Dg Chest Portable 1 View  Result Date: 06/02/2018 CLINICAL DATA:  Poor historian. Shortness of breath, unable to obtain further history. EXAM: PORTABLE CHEST 1 VIEW COMPARISON:  12/12/2010. FINDINGS: Massive enlargement cardiac silhouette could represent cardiomegaly or pericardial effusion. Mild increased markings, likely vascular congestion. Focal area of increased opacity at the RIGHT base, possible RIGHT lower lobe pneumonia. Suspected RIGHT effusion. IMPRESSION: Massive enlargement cardiac silhouette. Focal RIGHT lower lobe opacity. See discussion above. Electronically Signed   By: Elsie Stain M.D.   On: 06/02/2018 11:38    EKG:   Sinus rhythm 89 bpm right bundle branch block, nonspecific ST-T wave changes lateral leads  IMPRESSION AND PLAN:   1.  Acute hypoxic respiratory failure.  Patient on BiPAP in order to oxygenate 100% oxygen.  Admit to the CCU.  High risk for intubation. 2.  Shock with sepsis with pneumonia put on vancomycin and cefepime.  Check a procalcitonin and MRSA PCR. Patient on Levophed. 3.  Acute kidney injury.  Case discussed with Dr. Thedore Mins nephrology and he would like to monitor further.  4.  Acute on chronic systolic congestive heart failure with pulmonary hypertension.  Obtain stat echocardiogram.   Hold off on fluids and diuresis at this point as per nephrology. 5.  Elevated troponin secondary to demand ischemia.  Recent cardiac catheterization showed nonobstructive coronary artery disease. 6.  Chronic back pain on morphine pump.  Patient follows with Dr. Ether Griffins at [facility].  She is hesitant on decreasing the dose of her pump.  I advised her it may be better to shut it off at this point.  I mentioned this fact to Dr. Lonn Georgia critical care specialist.  All the records are reviewed and case discussed with ED provider. Management plans discussed with the patient, family and they are in agreement.  CODE STATUS: Full code  TOTAL TIME TAKING CARE OF THIS PATIENT: 55 minutes, critical care time.  Case discussed with Dr. Thedore Mins nephrology, Dr. Lonn Georgia critical care and Dr. Lady Gary cardiology.   Alford Highland M.D on 06/02/2018 at 1:48 PM  Between 7am to 6pm - Pager - [phone]  After 6pm call admission pager [phone]  [facility]  [phone]  CC: Primary care physician; Cain Sieve, MD

## 2018-06-02 NOTE — Progress Notes (Signed)
Oxygen increased by Dr. Don PerkingVeronese due to desaturations.

## 2018-06-02 NOTE — Progress Notes (Signed)
*  PRELIMINARY RESULTS* Echocardiogram 2D Echocardiogram has been performed.  Cristela BlueHege, Zayley Arras 06/02/2018, 2:47 PM

## 2018-06-02 NOTE — Progress Notes (Signed)
[facility] ICU [facility].  Patient alert on bipap when leaving ICU.

## 2018-06-02 NOTE — Progress Notes (Signed)
RT assisted with patient transfer from ER to ICU while patient on the V60 Bipap with no complications.

## 2018-06-02 NOTE — Consult Note (Signed)
Cardiology Consultation Note    Patient ID: Regina White, MRN: [id number], DOB/AGE: 06-29-1956 62 y.o. Admit date: 06/02/2018   Date of Consult: 06/02/2018 Primary Physician: Cain Sieve, MD Primary Cardiologist: Dr. Freeman Caldron, [facility]  Chief Complaint: increasing sob Reason for Consultation: pulmonary hypertension Requesting MD: Dr. Renae Gloss  HPI: Regina White is a 62 y.o. female with history of pulmonary hypertension and RV enlargement and dysfunction.  Patient is closely followed at [facility] and was last seen there July 9 of this year.  She has had intermittent shortness of breath for quite some time.  Has been transiently on oxygen but recently has not been per her report.  Review of the chart from [facility] suggest that fairly well recently with the ability to do chores around the house without dyspnea.  He was evaluated at [facility] in May of this year with a left heart cath showing nonobstructive coronary artery disease with mild luminal irregularities involving the distal left main and mid LAD.  Her EDP was 18 mmHg with an ejection fraction of 35%.  Echocardiogram done in May 2 of 2019 ejection fraction of 35% with severely elevated filling pressures.  She had a dilated left atrium moderate to severely dilated right ventricle with a severely decreased right ventricular systolic function.  She had mildly elevated right atrial pressure.  Arterial duplex lower extremity ultrasound revealed no occlusive arterial disease.  She has chronically edematous lower extremities.  Echocardiogram done in March 2019 revealed similar findings.  Echocardiogram done in the emergency room today revealed similar ejection fraction of 35% with a severely dilated right ventricle.  I report this does not appear to be appreciably different than her baseline echocardiogram.  Chest x-ray revealed massive enlargement of the cardiac silhouette with right lower lobe opacity.  Chest CT done in the  emergency room revealed no evidence of pulmonary emboli or aortic dissection.  There was marked enlargement of the pulmonary arteries and right-sided heart.  There were small patchy areas of infiltrate in the right lung.  Serum troponin was 0.28.  Potassium is 4.8 with a serum creatinine of 6.02.  BNP was 1569.  Pro BNP May 16, 2018 was 2280.  Creatinine at that time was 1.78.  Creatinine in June 2019 was 1.2.  Patient is currently satting at 100% BiPAP.  Patient has been relatively hypotensive requiring levo fed.  As an outpatient she has been on Entresto 49-51 mg twice daily, bisoprolol 2.5 mg daily, Bumex 2 mg daily, lisinopril 20 mg daily, spironolactone 25 mg daily.  She is currently being treated with Levophed and her outpatient regimen held.  Past Medical History:  Diagnosis Date  . Asthma   . CHF (congestive heart failure) (HCC)   . Chronic back pain   . Pulmonary hypertension (HCC)       Surgical History:  Past Surgical History:  Procedure Laterality Date  . BACK SURGERY       Home Meds: Prior to Admission medications   Medication Sig Start Date End Date Taking? Authorizing Provider  BECONASE AQ 42 MCG/SPRAY nasal spray Place 1 spray into the nose 2 (two) times daily. 05/10/18  Yes [provider]  bisoprolol (ZEBETA) 5 MG tablet Take 2.5 mg by mouth daily. 05/12/18  Yes [provider]  bumetanide (BUMEX) 1 MG tablet Take 2 mg by mouth daily. 05/16/18  Yes [provider]  ENTRESTO 49-51 MG Take 1 tablet by mouth 2 (two) times daily. 05/24/18  Yes  [provider]  Eszopiclone 3 MG TABS Take 3 mg by mouth at bedtime.  06/19/10  Yes [provider]  lisinopril (PRINIVIL,ZESTRIL) 20 MG tablet Take 20 mg by mouth daily.   Yes [provider]  montelukast (SINGULAIR) 10 MG tablet Take 10 mg by mouth at bedtime.   Yes [provider]  morphine (MSIR) 15 MG tablet Take 1 tablet by mouth 3 (three) times daily. 05/24/18  Yes [provider]  spironolactone (ALDACTONE) 25 MG tablet Take 25 mg by mouth daily.  02/17/16 06/02/18 Yes [provider]  SYNTHROID 200 MCG tablet Take 1 tablet by mouth daily. 04/23/18  Yes [provider]  zafirlukast (ACCOLATE) 20 MG tablet Take 20 mg by mouth daily.  08/27/16  Yes [provider]  albuterol (PROVENTIL HFA;VENTOLIN HFA) 108 (90 Base) MCG/ACT inhaler Inhale two puffs every six hours if needed for wheezing 10/12/16   [provider]  cyclobenzaprine (FLEXERIL) 10 MG tablet TAKE 1 TABLET BY MOUTH EVERY 8 HOURS AS NEEDED FOR MUSCLE SPASMS 09/06/16   [provider]  ipratropium (ATROVENT HFA) 17 MCG/ACT inhaler Inhale 2 puffs into the lungs every 4 (four) hours as needed.  02/17/16 02/16/17  [provider]    Inpatient Medications:  . [START ON 06/03/2018] levothyroxine  200 mcg Oral QAC breakfast  . montelukast  10 mg Oral QHS  . vancomycin variable dose per unstable renal function (pharmacist dosing)   Does not apply See admin instructions  . [START ON 06/03/2018] zafirlukast  20 mg Oral Daily   . [START ON 06/03/2018] ceFEPime (MAXIPIME) IV    . norepinephrine    . vancomycin      Allergies:  Allergies  Allergen Reactions  . Erythromycin Nausea And Vomiting and Rash    STOMACH PAIN  . Levofloxacin     Other reaction(s): Other MUSCLE ACHES  . Lidocaine Rash  . Sulfa Antibiotics Rash    Social History   Socioeconomic History  . Marital status: Married    Spouse name: Not on file  . Number of children: Not on file  . Years of education: Not on file  . Highest education level: Not on file  Occupational History  . Not on file  Social Needs  . Financial resource strain: Not on file  . Food insecurity:    Worry: Not on file    Inability: Not on file  . Transportation needs:    Medical: Not on file    Non-medical: Not on file  Tobacco Use  . Smoking status: Former Smoker    Packs/day: 1.00    Years: 30.00     Pack years: 30.00    Types: Cigarettes    Last attempt to quit: 06/09/1999    Years since quitting: 18.9  . Smokeless tobacco: Never Used  Substance and Sexual Activity  . Alcohol use: No  . Drug use: Not on file  . Sexual activity: Not on file  Lifestyle  . Physical activity:    Days per week: Not on file    Minutes per session: Not on file  . Stress: Not on file  Relationships  . Social connections:    Talks on phone: Not on file    Gets together: Not on file    Attends religious service: Not on file    Active member of club or organization: Not on file    Attends meetings of clubs or organizations: Not on file    Relationship status:  Not on file  . Intimate partner violence:    Fear of current or ex partner: Not on file    Emotionally abused: Not on file    Physically abused: Not on file    Forced sexual activity: Not on file  Other Topics Concern  . Not on file  Social History Narrative  . Not on file     Family History  Problem Relation Age of Onset  . Hypertension Mother   . Liver cancer Father      Review of Systems: A 12-system review of systems was performed and is negative except as noted in the HPI.  Labs: Recent Labs    06/02/18 1207  TROPONINI 0.28*   Lab Results  Component Value Date   WBC 6.7 06/02/2018   HGB 12.7 06/02/2018   HCT 39.5 06/02/2018   MCV 83.1 06/02/2018   PLT 168 06/02/2018    Recent Labs  Lab 06/02/18 1207  NA 136  K 4.8  CL 102  CO2 17*  BUN 106*  CREATININE 6.02*  CALCIUM 6.4*  GLUCOSE 149*   No results found for: CHOL, HDL, LDLCALC, TRIG No results found for: DDIMER  Radiology/Studies:  Ct Angio Chest Pe W Or Wo Contrast  Result Date: 06/02/2018 CLINICAL DATA:  Shortness of breath. EXAM: CT ANGIOGRAPHY CHEST WITH CONTRAST TECHNIQUE: Multidetector CT imaging of the chest was performed using the standard protocol during bolus administration of intravenous contrast. Multiplanar CT image reconstructions and MIPs  were obtained to evaluate the vascular anatomy. CONTRAST:  75mL ISOVUE-370 IOPAMIDOL (ISOVUE-370) INJECTION 76% COMPARISON:  Chest x-rays dated 06/02/2018 and 12/12/2010 FINDINGS: Cardiovascular: There are no pulmonary emboli. There is marked enlargement of the pulmonary arteries with dilatation of the right atrium and ventricle, consistent with pulmonary arterial hypertension. There is cardiomegaly. Diffuse prominence of the pulmonary vascularity. Mediastinum/Nodes: There is an enlarged precarinal lymph node measuring 2 cm in diameter. There also small lymph nodes in the right hilum and there is a 2 cm subcarinal lymph node. Minimal residual thyroid tissue. Lungs/Pleura: There are small patchy infiltrates in the right lower lobe laterally and posteriorly. There is also a tiny patchy area of infiltrate in the right upper lobe. Slight focal atelectasis in the posterior aspect of the left upper lobe. Upper Abdomen: Cholelithiasis.  Left renal atrophy. Musculoskeletal: No acute abnormalities. Review of the MIP images confirms the above findings. IMPRESSION: 1. No evidence of pulmonary emboli or aortic dissection. 2. Marked enlargement of the pulmonary arteries and right side of the heart consistent with pulmonary arterial hypertension. Cardiomegaly. 3. Small patchy areas of infiltrate in the right lung. 4. Mediastinal adenopathy of unknown significance. 5. Cholelithiasis. Electronically Signed   By: Francene BoyersJames  Maxwell M.D.   On: 06/02/2018 13:10   Dg Chest Portable 1 View  Result Date: 06/02/2018 CLINICAL DATA:  Poor historian. Shortness of breath, unable to obtain further history. EXAM: PORTABLE CHEST 1 VIEW COMPARISON:  12/12/2010. FINDINGS: Massive enlargement cardiac silhouette could represent cardiomegaly or pericardial effusion. Mild increased markings, likely vascular congestion. Focal area of increased opacity at the RIGHT base, possible RIGHT lower lobe pneumonia. Suspected RIGHT effusion. IMPRESSION: Massive  enlargement cardiac silhouette. Focal RIGHT lower lobe opacity. See discussion above. Electronically Signed   By: Elsie StainJohn T Curnes M.D.   On: 06/02/2018 11:38    Wt Readings from Last 3 Encounters:  06/02/18 112.9 kg (248 lb 14.4 oz)  05/01/18 105.2 kg (232 lb)    EKG: Sinus rhythm with right bundle branch block  Physical Exam: Acutely ill-appearing white female. Blood pressure 120/62, pulse 75, temperature 98.8 F (37.1 C), temperature source Oral, resp. rate 15, height 5\' 5"  (1.651 m), weight 112.9 kg (248 lb 14.4 oz), SpO2 (!) 83 %. Body mass index is 41.42 kg/m. General: Acutely ill-appearing white female with BiPAP. Head: Normocephalic, atraumatic, sclera non-icteric, no xanthomas, nares are without discharge.  Neck: Negative for carotid bruits.  JVD elevated to the jaw. Lungs: Decreased breath sounds bilaterally. Heart: RRR with S1 S2.  2/6 systolic murmur. Abdomen: Soft, non-tender, non-distended with normoactive bowel sounds. No hepatomegaly. No rebound/guarding. No obvious abdominal masses. Msk:  Strength and tone appear normal for age. Extremities: 4+ edema lower extremities to the thighs. Neuro: Oriented x3 no facial asymmetry. No focal deficit. Moves all extremities spontaneously. Psych:  Responds to questions appropriately however is somewhat lethargic.     Assessment and Plan  Acutely ill-appearing female on Levophed and BiPAP.  She has a history as mentioned above right-sided failure follow-up at [facility].  She has an ejection fraction of 35%.  Cardiac cath 2 months ago revealed no evidence of critical coronary disease with an EF of 35%.  She has been aggressively treated in the heart failure clinic in pulmonary hypertension clinic at [facility] on evidence-based medications.  Over the last couple of days she has had decreased p.o. intake nausea and increasing shortness of breath.  Presented to our emergency room with the aforementioned symptoms.  She has acute renal  insufficiency with a serum creatinine of 6 with a GFR of 7.  She has reported 1.4 L urine out presentation to the emergency room.  She was evaluated with a contrast CT in the ER showing no dissection or pulmonary embolus.  Echo revealed similar findings to one done several months ago at [facility].  Patient is on levo fed to support pressure.  Patient's family adamant that she be transferred to [facility].  I discussed with the intensive care unit team regarding patient family desires.  Need  nephrology input given her acute renal insufficiency.  Likely will need dialysis at least temporarily.  Heart pressure as needed with levo fed and oxygen saturation with BiPAP.  Would agree with holding bisoprolol, Entresto, ACE inhibitor.  Signed, Dalia Heading MD 06/02/2018, 3:55 PM Pager: [phone]

## 2018-06-02 NOTE — Progress Notes (Signed)
Report given in person to Adline PotterKrista Falvey, RN with [facility].

## 2018-06-02 NOTE — Progress Notes (Signed)
RN spoke with Dr. Thedore MinsSingh and asked if it is okay from renal standpoint if patient has PICC line placed?  Dr. Thedore MinsSingh stated "yes it is okay for Picc line to be placed."

## 2018-06-02 NOTE — Progress Notes (Signed)
   06/02/18 1225  Clinical Encounter Type  Visited With Family  Visit Type Initial  Referral From Other (Comment) (unit clerk)  Consult/Referral To [facility] responded to page from unit clerk.  Upon arrival, chaplain found patient and bed absent and a person weeping in the corner.  Chaplain introduced herself and met patient's son Legrand Como.  Chaplain utilized active and reflective listening as son reviewed health issues of both patient (mother) and his father (who just got home from the hospital).  Son is the primary caregiver, though he reports have two involved siblings.  Legrand Como described a close, loving relationship with his parents and expressed feelings of helplessness in the current situation, of wishing he could 'do more.'  Conversation around what he had done and was doing.  Chaplain and patient's son prayed together.  Son open to ongoing chaplain follow up.  Chaplain also encouraged son to have chaplain paged

## 2018-06-02 NOTE — Consult Note (Signed)
Date: 06/02/2018                  Patient Name:  Regina White  MRN: [id number]  DOB: May 14, 1956  Age / Sex: 62 y.o., female         PCP: Cain Sieve, MD                 Service Requesting Consult: IM/ Alford Highland, MD                 Reason for Consult: ARF            History of Present Illness: Patient is a 62 y.o. female with medical problems of Pulmonary HTN, multiple back surgeries, morphine pump who was admitted to [facility] on 06/02/2018 for evaluation of acute onset shortness of breath.  Information is obtained from patient family report that she got acutely ill last evening.  Over the weekend she was able to eat without nausea or vomiting.  No shortness of breath.  No fevers, cough, hemoptysis or dysuria.  No blood in the urine.  She did have few episodes of diarrhea a few days ago.  In the emergency room, she was noted to have distended pulmonary arteries by a bedside ultrasound therefore PE was suspected.  She underwent CT angiogram with IV contrast.  It was negative for pulmonary embolism. Her baseline creatinine is1.2 from June 2019.  Presenting creatinine in the emergency room is 6.02, BUN of 100.  Patient is currently requiring positive pressure ventilation for respiratory support She also requiring Levophed for hemodynamic support    Medications: Outpatient medications:  (Not in a hospital admission)  Current medications: Current Facility-Administered Medications  Medication Dose Route Frequency Provider Last Rate Last Dose  . calcium gluconate 1 g in sodium chloride 0.9 % 100 mL IVPB  1 g Intravenous Once Don Perking, [place], MD 330 mL/hr at 06/02/18 1313 1 g at 06/02/18 1313   Current Outpatient Medications  Medication Sig Dispense Refill  . BECONASE AQ 42 MCG/SPRAY nasal spray Place 1 spray into the nose 2 (two) times daily.    . bisoprolol (ZEBETA) 5 MG tablet Take 2.5 mg by mouth daily.    . bumetanide (BUMEX) 1 MG tablet Take 2 mg by mouth  daily.    Marland Kitchen ENTRESTO 49-51 MG Take 1 tablet by mouth 2 (two) times daily.    . Eszopiclone 3 MG TABS Take 3 mg by mouth at bedtime.     Marland Kitchen lisinopril (PRINIVIL,ZESTRIL) 20 MG tablet Take 20 mg by mouth daily.    . montelukast (SINGULAIR) 10 MG tablet Take 10 mg by mouth at bedtime.    Marland Kitchen morphine (MSIR) 15 MG tablet Take 1 tablet by mouth 3 (three) times daily.    Marland Kitchen spironolactone (ALDACTONE) 25 MG tablet Take 25 mg by mouth daily.     Marland Kitchen SYNTHROID 200 MCG tablet Take 1 tablet by mouth daily.    . zafirlukast (ACCOLATE) 20 MG tablet Take 20 mg by mouth daily.     Marland Kitchen albuterol (PROVENTIL HFA;VENTOLIN HFA) 108 (90 Base) MCG/ACT inhaler Inhale two puffs every six hours if needed for wheezing    . bacitracin 500 UNIT/GM ointment Apply 1 application topically 2 (two) times daily. (Patient not taking: Reported on 06/02/2018) 30 g 1  . cyclobenzaprine (FLEXERIL) 10 MG tablet TAKE 1 TABLET BY MOUTH EVERY 8 HOURS AS NEEDED FOR MUSCLE SPASMS    . doxycycline (VIBRA-TABS) 100 MG tablet Take 1 tablet (100 mg  total) by mouth 2 (two) times daily. (Patient not taking: Reported on 06/02/2018) 20 tablet 0  . gentamicin cream (GARAMYCIN) 0.1 % Apply 1 application topically 3 (three) times daily. (Patient not taking: Reported on 06/02/2018) 15 g 0  . ipratropium (ATROVENT HFA) 17 MCG/ACT inhaler Inhale 2 puffs into the lungs every 4 (four) hours as needed.         Allergies: Allergies  Allergen Reactions  . Erythromycin Nausea And Vomiting and Rash    STOMACH PAIN  . Levofloxacin     Other reaction(s): Other MUSCLE ACHES  . Lidocaine Rash  . Sulfa Antibiotics Rash      Past Medical History: History reviewed. No pertinent past medical history.   Past Surgical History: History reviewed. No pertinent surgical history.   Family History: No family history on file.   Social History: Social History   Socioeconomic History  . Marital status: Married    Spouse name: Not on file  . Number of children:  Not on file  . Years of education: Not on file  . Highest education level: Not on file  Occupational History  . Not on file  Social Needs  . Financial resource strain: Not on file  . Food insecurity:    Worry: Not on file    Inability: Not on file  . Transportation needs:    Medical: Not on file    Non-medical: Not on file  Tobacco Use  . Smoking status: Former Smoker    Packs/day: 1.00    Years: 30.00    Pack years: 30.00    Types: Cigarettes    Last attempt to quit: 06/09/1999    Years since quitting: 18.9  . Smokeless tobacco: Never Used  Substance and Sexual Activity  . Alcohol use: No  . Drug use: Not on file  . Sexual activity: Not on file  Lifestyle  . Physical activity:    Days per week: Not on file    Minutes per session: Not on file  . Stress: Not on file  Relationships  . Social connections:    Talks on phone: Not on file    Gets together: Not on file    Attends religious service: Not on file    Active member of club or organization: Not on file    Attends meetings of clubs or organizations: Not on file    Relationship status: Not on file  . Intimate partner violence:    Fear of current or ex partner: Not on file    Emotionally abused: Not on file    Physically abused: Not on file    Forced sexual activity: Not on file  Other Topics Concern  . Not on file  Social History Narrative  . Not on file     Review of Systems: not reliable. See above Gen:  HEENT:  CV:  Resp:  GI: GU :  MS:  Derm:   Psych: Heme:  Neuro:  Endocrine  Vital Signs: Blood pressure 104/79, pulse 78, temperature 98.8 F (37.1 C), temperature source Oral, resp. rate 12, height 5\' 1"  (1.549 m), weight 108.9 kg (240 lb), SpO2 97 %.   Intake/Output Summary (Last 24 hours) at 06/02/2018 1325 Last data filed at 06/02/2018 1300 Gross per 24 hour  Intake 300 ml  Output -  Net 300 ml    Weight trends: [employer]   06/02/18 1217  Weight: 108.9 kg (240 lb)     Physical Exam: General:  Critically ill appearing, laying  in the bed  HEENT  BiPAP mask in place  Neck:  Short, no masses   Lungs: positive pressure ventilation, bilateral crackles diffuse  Heart::  no irregular   Abdomen:  Distended, old surgical scar noted  Extremities:  1-2+ dependent edema  Neurologic:  Alert, able to follow few simple commands    Skin:  Discoloration from chronic edema     Foley: To be placed       Lab results: Basic Metabolic Panel: Recent Labs  Lab 06/02/18 1207  NA 136  K 4.8  CL 102  CO2 17*  GLUCOSE 149*  BUN 106*  CREATININE 6.02*  CALCIUM 6.4*    Liver Function Tests: No results for input(s): AST, ALT, ALKPHOS, BILITOT, PROT, ALBUMIN in the last 168 hours. No results for input(s): LIPASE, AMYLASE in the last 168 hours. No results for input(s): AMMONIA in the last 168 hours.  CBC: Recent Labs  Lab 06/02/18 1207  WBC 6.7  NEUTROABS 5.9  HGB 12.7  HCT 39.5  MCV 83.1  PLT 168    Cardiac Enzymes: Recent Labs  Lab 06/02/18 1207  TROPONINI 0.28*    BNP: Invalid input(s): POCBNP  CBG: No results for input(s): GLUCAP in the last 168 hours.  Microbiology: No results found for this or any previous visit (from the past 720 hour(s)).   Coagulation Studies: No results for input(s): LABPROT, INR in the last 72 hours.  Urinalysis: No results for input(s): COLORURINE, LABSPEC, PHURINE, GLUCOSEU, HGBUR, BILIRUBINUR, KETONESUR, PROTEINUR, UROBILINOGEN, NITRITE, LEUKOCYTESUR in the last 72 hours.  Invalid input(s): APPERANCEUR      Imaging: Ct Angio Chest Pe W Or Wo Contrast  Result Date: 06/02/2018 CLINICAL DATA:  Shortness of breath. EXAM: CT ANGIOGRAPHY CHEST WITH CONTRAST TECHNIQUE: Multidetector CT imaging of the chest was performed using the standard protocol during bolus administration of intravenous contrast. Multiplanar CT image reconstructions and MIPs were obtained to evaluate the vascular anatomy. CONTRAST:  75mL  ISOVUE-370 IOPAMIDOL (ISOVUE-370) INJECTION 76% COMPARISON:  Chest x-rays dated 06/02/2018 and 12/12/2010 FINDINGS: Cardiovascular: There are no pulmonary emboli. There is marked enlargement of the pulmonary arteries with dilatation of the right atrium and ventricle, consistent with pulmonary arterial hypertension. There is cardiomegaly. Diffuse prominence of the pulmonary vascularity. Mediastinum/Nodes: There is an enlarged precarinal lymph node measuring 2 cm in diameter. There also small lymph nodes in the right hilum and there is a 2 cm subcarinal lymph node. Minimal residual thyroid tissue. Lungs/Pleura: There are small patchy infiltrates in the right lower lobe laterally and posteriorly. There is also a tiny patchy area of infiltrate in the right upper lobe. Slight focal atelectasis in the posterior aspect of the left upper lobe. Upper Abdomen: Cholelithiasis.  Left renal atrophy. Musculoskeletal: No acute abnormalities. Review of the MIP images confirms the above findings. IMPRESSION: 1. No evidence of pulmonary emboli or aortic dissection. 2. Marked enlargement of the pulmonary arteries and right side of the heart consistent with pulmonary arterial hypertension. Cardiomegaly. 3. Small patchy areas of infiltrate in the right lung. 4. Mediastinal adenopathy of unknown significance. 5. Cholelithiasis. Electronically Signed   By: Francene Boyers M.D.   On: 06/02/2018 13:10   Dg Chest Portable 1 View  Result Date: 06/02/2018 CLINICAL DATA:  Poor historian. Shortness of breath, unable to obtain further history. EXAM: PORTABLE CHEST 1 VIEW COMPARISON:  12/12/2010. FINDINGS: Massive enlargement cardiac silhouette could represent cardiomegaly or pericardial effusion. Mild increased markings, likely vascular congestion. Focal area of increased opacity at the RIGHT  base, possible RIGHT lower lobe pneumonia. Suspected RIGHT effusion. IMPRESSION: Massive enlargement cardiac silhouette. Focal RIGHT lower lobe opacity.  See discussion above. Electronically Signed   By: Elsie StainJohn T Curnes M.D.   On: 06/02/2018 11:38      Assessment & Plan: Pt is a 62 y.o.caucasian  female with  history of pulmonary hypertension, multiple back surgeries, morphine at home was admitted on 06/02/2018 with respiratory distress.   1.  Acute renal failure Baseline creatinine 1.2/GFR 48 from June 2019 Acute kidney injury is likely secondary to hypotension/hemodynamic compromise leading to ATN She also got IV contrast which will likely contribute to worsening of AKI and delayed recovery Potassium level is normal. Would defer acute hemodialysis at present until respiratory status is stabilized Recommend Foley catheter for continued urine output monitoring  2.  Acute respiratory failure Currently requiring positive pressure ventilation Low threshold for ventilator support  3.  Chronic pulmonary hypertension and lower extremity edema At this point her intravascular volume assessment is difficult Continue hemodynamic support with pressors  D/w Dr Hilton SinclairWeiting, patient's son and family      LOS: 0 Egon Dittus 7/26/20191:25 PM  [facility] [place], [place] [phone]  Note: This note was prepared with Dragon dictation. Any transcription errors are unintentional

## 2018-06-02 NOTE — Progress Notes (Signed)
   06/02/18 1500  Clinical Encounter Type  Visited With Patient and family together  Visit Type Follow-up   Chaplain followed up with family and patient.  Patient was about to be transferred to ICU.  Chaplain offered to walk family to waiting room; they declined.  Encouraged patient son and family to have chaplain paged as needed.

## 2018-06-02 NOTE — Progress Notes (Signed)
Pharmacy Antibiotic Note  Regina White is a 62 y.o. female admitted on 06/02/2018 with sepsis.  Pharmacy has been consulted for Vancomycin and Cefepime dosing. Patient received Vancomycin 1g IV and Cefepime 1g IV in the ED.  Plan: Patient has acute renal failure with an estimated CrCl of 10.9 ml/min. Will order Vancomycin 500mg  IV once for a total loading dose of 1500mg  today. Will check a Vancomycin random level in 48hr.  Will order Cefepime 1g IV q24h  Height: 5\' 1"  (154.9 cm) Weight: 240 lb (108.9 kg) IBW/kg (Calculated) : 47.8  Temp (24hrs), Avg:98.8 F (37.1 C), Min:98.8 F (37.1 C), Max:98.8 F (37.1 C)  Recent Labs  Lab 06/02/18 1207  WBC 6.7  CREATININE 6.02*  LATICACIDVEN 1.0    Estimated Creatinine Clearance: 10.9 mL/min (A) (by C-G formula based on SCr of 6.02 mg/dL (H)).    Allergies  Allergen Reactions  . Erythromycin Nausea And Vomiting and Rash    STOMACH PAIN  . Levofloxacin     Other reaction(s): Other MUSCLE ACHES  . Lidocaine Rash  . Sulfa Antibiotics Rash    Antimicrobials this admission: Vancomycin 7/26 >>  Cefepime 7/26 >>   Thank you for allowing pharmacy to be a part of this patient's care.  Clovia CuffLisa Shequita Peplinski, PharmD, BCPS 06/02/2018 3:29 PM

## 2018-06-02 NOTE — ED Provider Notes (Signed)
[facility] Emergency Department Provider Note  ____________________________________________  Time seen: Approximately 11:31 AM  I have reviewed the triage vital signs and the nursing notes.   HISTORY  Chief Complaint Respiratory Distress   HPI Regina White is a 62 y.o. female with a history of COPD, asthma, CHF with EF of 35%, DVT not on blood thinners, pulmonary hypertension, OSA, hypertension who presents for evaluation of shortness of breath.  Patient reports 2 to 3 days of progressively worsening shortness of breath.  She has had cough productive of yellow sputum. No CP, no leg swelling, no weight gain, no fever, no chills, no hemoptysis. SOB is now constant and severe, worse with minimal ambulation. She has been using her inhalers in home. Patient hypoxic per EMS on arrival started on duoneb and solumedrol. Patient is a former smoker.  Patient Active Problem List   Diagnosis Date Noted  . Acute respiratory failure with hypoxia (HCC) 06/02/2018  . Diarrhea of presumed infectious origin 02/17/2016  . Right acute serous otitis media 02/17/2016  . COPD (chronic obstructive pulmonary disease) (HCC) 12/23/2015  . Heart failure with preserved ejection fraction (HCC) 12/23/2015  . DDD (degenerative disc disease) 09/25/2013  . Narrowing of intervertebral disc space 09/25/2013  . OSA (obstructive sleep apnea) 07/25/2013  . Deep vein thrombosis (DVT) (HCC) 06/25/2013  . Erythema 06/18/2013  . Left leg pain 06/18/2013  . Mediastinal lymphadenopathy 06/18/2013  . DVT, lower extremity (HCC) 06/14/2013  . Pulmonary hypertension (HCC) 06/14/2013  . Asthma 09/21/2010  . Chronic pain 09/21/2010  . Hypertension, benign 09/21/2010  . Hypothyroidism 09/21/2010  . Venous stasis 09/21/2010    Past Surgical History:  Procedure Laterality Date  . BACK SURGERY      Prior to Admission medications   Medication Sig Start Date End Date Taking? Authorizing  Provider  BECONASE AQ 42 MCG/SPRAY nasal spray Place 1 spray into the nose 2 (two) times daily. 05/10/18  Yes [provider]  bisoprolol (ZEBETA) 5 MG tablet Take 2.5 mg by mouth daily. 05/12/18  Yes [provider]  bumetanide (BUMEX) 1 MG tablet Take 2 mg by mouth daily. 05/16/18  Yes [provider]  ENTRESTO 49-51 MG Take 1 tablet by mouth 2 (two) times daily. 05/24/18  Yes [provider]  Eszopiclone 3 MG TABS Take 3 mg by mouth at bedtime.  06/19/10  Yes [provider]  lisinopril (PRINIVIL,ZESTRIL) 20 MG tablet Take 20 mg by mouth daily.   Yes [provider]  montelukast (SINGULAIR) 10 MG tablet Take 10 mg by mouth at bedtime.   Yes [provider]  morphine (MSIR) 15 MG tablet Take 1 tablet by mouth 3 (three) times daily. 05/24/18  Yes [provider]  spironolactone (ALDACTONE) 25 MG tablet Take 25 mg by mouth daily.  02/17/16 06/02/18 Yes [provider]  SYNTHROID 200 MCG tablet Take 1 tablet by mouth daily. 04/23/18  Yes [provider]  zafirlukast (ACCOLATE) 20 MG tablet Take 20 mg by mouth daily.  08/27/16  Yes [provider]  albuterol (PROVENTIL HFA;VENTOLIN HFA) 108 (90 Base) MCG/ACT inhaler Inhale two puffs every six hours if needed for wheezing 10/12/16   [provider]  cyclobenzaprine (FLEXERIL) 10 MG tablet TAKE 1 TABLET BY MOUTH EVERY 8 HOURS AS NEEDED FOR MUSCLE SPASMS 09/06/16   [provider]  ipratropium (ATROVENT HFA) 17 MCG/ACT inhaler Inhale 2 puffs into the lungs every 4 (four) hours as needed.  02/17/16 02/16/17  [provider]    Allergies Erythromycin; Levofloxacin; Lidocaine; and Sulfa antibiotics  Family History  Problem Relation Age of Onset  . Hypertension Mother   . Liver cancer Father     Social History Social History   Tobacco Use  . Smoking status: Former Smoker    Packs/day: 1.00    Years: 30.00    Pack years: 30.00     Types: Cigarettes    Last attempt to quit: 06/09/1999    Years since quitting: 18.9  . Smokeless tobacco: Never Used  Substance Use Topics  . Alcohol use: No  . Drug use: Not on file    Review of Systems  Constitutional: Negative for fever. Eyes: Negative for visual changes. ENT: Negative for sore throat. Neck: No neck pain  Cardiovascular: Negative for chest pain. Respiratory: +shortness of breath, cough Gastrointestinal: Negative for abdominal pain, vomiting or diarrhea. Genitourinary: Negative for dysuria. Musculoskeletal: Negative for back pain. Skin: Negative for rash. Neurological: Negative for headaches, weakness or numbness. Psych: No SI or HI  ____________________________________________   PHYSICAL EXAM:  VITAL SIGNS:  98.20F   HR 94   RR 36  POX 88% on 9LNC  BP 120/60  Constitutional: Alert and oriented, patient is in severe respiratory distress.  HEENT:      Head: Normocephalic and atraumatic.         Eyes: Conjunctivae are normal. Sclera is non-icteric.       Mouth/Throat: Mucous membranes are dry.       Neck: Supple with no signs of meningismus. Cardiovascular: Regular rate and rhythm. No murmurs, gallops, or rubs. 2+ symmetrical distal pulses are present in all extremities. No JVD. Respiratory: Severe respiratory distress, increased work of breathing, hypoxic to 88% on 9L, decrease air movement bilaterally, wheezing and rhonchi on the R side, clear on the L  Gastrointestinal: Soft, non tender, and non distended with positive bowel sounds. No rebound or guarding. Musculoskeletal: 1+ pitting edema bilaterally Neurologic: Normal speech and language. Face is symmetric. Moving all extremities. No gross focal neurologic deficits are appreciated. Skin: Skin is warm, dry and intact. No rash noted. Psychiatric: Mood and affect are normal. Speech and behavior are normal.  ____________________________________________   LABS (all labs ordered are listed, but only  abnormal results are displayed)  Labs Reviewed  CBC WITH DIFFERENTIAL/PLATELET - Abnormal; Notable for the following components:      Result Value   RDW 20.5 (*)    Lymphs Abs 0.4 (*)    All other components within normal limits  BASIC METABOLIC PANEL - Abnormal; Notable for the following components:   CO2 17 (*)    Glucose, Bld 149 (*)    BUN 106 (*)    Creatinine, Ser 6.02 (*)    Calcium 6.4 (*)    GFR calc non Af Amer 7 (*)    GFR calc Af Amer 8 (*)    Anion gap 17 (*)    All other components within normal limits  BRAIN NATRIURETIC PEPTIDE - Abnormal; Notable for the following components:   B Natriuretic Peptide 1,569.0 (*)    All other components within normal limits  BLOOD GAS, VENOUS - Abnormal; Notable for the following components:   pH, Ven 7.12 (*)    pCO2, Ven 37 (*)    pO2, Ven 55.0 (*)    Bicarbonate 12.0 (*)    Acid-base deficit 16.5 (*)    All other components within normal limits  TROPONIN I - Abnormal; Notable for the following  components:   Troponin I 0.28 (*)    All other components within normal limits  URINALYSIS, ROUTINE W REFLEX MICROSCOPIC - Abnormal; Notable for the following components:   Color, Urine YELLOW (*)    APPearance TURBID (*)    Hgb urine dipstick MODERATE (*)    Protein, ur 100 (*)    Leukocytes, UA LARGE (*)    RBC / HPF >50 (*)    WBC, UA >50 (*)    Bacteria, UA MANY (*)    All other components within normal limits  CULTURE, BLOOD (ROUTINE X 2)  CULTURE, BLOOD (ROUTINE X 2)  MRSA PCR SCREENING  URINE CULTURE  LACTIC ACID, PLASMA  PROCALCITONIN  LACTIC ACID, PLASMA   ____________________________________________  EKG  ED ECG REPORT I, Nita Sicklearolina Zandra Lajeunesse, the attending physician, personally viewed and interpreted this ECG.  Normal sinus rhythm, rate of 89, right bundle branch block, prolonged QTC, diffuse T wave inversions with no ST elevation.  These changes are all new when compared to  prior. ____________________________________________  RADIOLOGY  I have personally reviewed the images performed during this visit and I agree with the Radiologist's read.   Interpretation by Radiologist:  Ct Angio Chest Pe W Or Wo Contrast  Result Date: 06/02/2018 CLINICAL DATA:  Shortness of breath. EXAM: CT ANGIOGRAPHY CHEST WITH CONTRAST TECHNIQUE: Multidetector CT imaging of the chest was performed using the standard protocol during bolus administration of intravenous contrast. Multiplanar CT image reconstructions and MIPs were obtained to evaluate the vascular anatomy. CONTRAST:  75mL ISOVUE-370 IOPAMIDOL (ISOVUE-370) INJECTION 76% COMPARISON:  Chest x-rays dated 06/02/2018 and 12/12/2010 FINDINGS: Cardiovascular: There are no pulmonary emboli. There is marked enlargement of the pulmonary arteries with dilatation of the right atrium and ventricle, consistent with pulmonary arterial hypertension. There is cardiomegaly. Diffuse prominence of the pulmonary vascularity. Mediastinum/Nodes: There is an enlarged precarinal lymph node measuring 2 cm in diameter. There also small lymph nodes in the right hilum and there is a 2 cm subcarinal lymph node. Minimal residual thyroid tissue. Lungs/Pleura: There are small patchy infiltrates in the right lower lobe laterally and posteriorly. There is also a tiny patchy area of infiltrate in the right upper lobe. Slight focal atelectasis in the posterior aspect of the left upper lobe. Upper Abdomen: Cholelithiasis.  Left renal atrophy. Musculoskeletal: No acute abnormalities. Review of the MIP images confirms the above findings. IMPRESSION: 1. No evidence of pulmonary emboli or aortic dissection. 2. Marked enlargement of the pulmonary arteries and right side of the heart consistent with pulmonary arterial hypertension. Cardiomegaly. 3. Small patchy areas of infiltrate in the right lung. 4. Mediastinal adenopathy of unknown significance. 5. Cholelithiasis.  Electronically Signed   By: Francene BoyersJames  Maxwell M.D.   On: 06/02/2018 13:10   Dg Chest Portable 1 View  Result Date: 06/02/2018 CLINICAL DATA:  Poor historian. Shortness of breath, unable to obtain further history. EXAM: PORTABLE CHEST 1 VIEW COMPARISON:  12/12/2010. FINDINGS: Massive enlargement cardiac silhouette could represent cardiomegaly or pericardial effusion. Mild increased markings, likely vascular congestion. Focal area of increased opacity at the RIGHT base, possible RIGHT lower lobe pneumonia. Suspected RIGHT effusion. IMPRESSION: Massive enlargement cardiac silhouette. Focal RIGHT lower lobe opacity. See discussion above. Electronically Signed   By: Elsie StainJohn T Curnes M.D.   On: 06/02/2018 11:38     ____________________________________________   PROCEDURES  Procedure(s) performed: None Procedures Critical Care performed: yes  CRITICAL CARE Performed by: Nita Sicklearolina Jamise Pentland  ?  Total critical care time: 60 min  Critical care time was  exclusive of separately billable procedures and treating other patients.  Critical care was necessary to treat or prevent imminent or life-threatening deterioration.  Critical care was time spent personally by me on the following activities: development of treatment plan with patient and/or surrogate as well as nursing, discussions with consultants, evaluation of patient's response to treatment, examination of patient, obtaining history from patient or surrogate, ordering and performing treatments and interventions, ordering and review of laboratory studies, ordering and review of radiographic studies, pulse oximetry and re-evaluation of patient's condition.  ____________________________________________   INITIAL IMPRESSION / ASSESSMENT AND PLAN / ED COURSE   62 y.o. female with a history of COPD, asthma, CHF with EF of 35%, DVT not on blood thinners, pulmonary hypertension, OSA, hypertension who presents for evaluation of shortness of breath.   Patient arrives in severe respiratory distress, hypoxic to 88% on 9 L of oxygen, increased work of breathing, rhonchi and wheezes on the right side and decreased bilateral air movement.  Differential diagnoses including pneumonia versus pulmonary edema versus CHF exacerbation versus COPD exacerbation versus PE. Patient was immediately placed on BiPAP and started on 3 DuoNeb's.  Labs, EKG and chest x-ray are pending.  Clinical Course as of Jun 03 1519  Fri Jun 02, 2018  1214 Patient started to become hypotensive, VBG showing pH of 7.12 with a PCO2 of 37 therefore concerning for sepsis.  Patient was started on IV fluids, cefepime and vancomycin.  After liter of IV fluids patient remained hypotensive and she was started on a second liter and norepinephrine.  Her mental status has remained intact.   [CV]  1247 Chest x-ray showing possible pneumonia and also severely enlarged cardiac silhouette.  Bedside ultrasound was done with no evidence of pericardial effusion however was very limited view due to patient's body habitus.  Patient was taken for CT scan without contrast which showed no evidence of pericardial effusion.  However CT was concerning for severely enlarged pulmonary artery and with a history of DVT in a patient who is not being anticoagulated I had a very high suspicion for a saddle emboli. If patient had a saddle emboli and unstable hemodynamically, vascular would be consulted for intraarterial embolectomy, therefore a decision to give IV constrast was done prior to creatinine being resulted.   CT with contrast was then performed however that was negative for a PE.   [CV]  1307 Labs are now back showing acute on chronic kidney failure with creatinine of 6.02, and anion gap of 17, bicarb of 17.  I spoke with Dr. Thedore Mins, nephrology for emergent dialysis.  He will plan to dialyze patient as soon as she gets to the ICU.  Her troponin is also elevated to 0.28 with an EKG concerning for diffuse ischemia.   At this time this is concerning for demand ischemia.  Patient had a catheterization done 2 months ago showing no evidence of obstructive coronary artery disease.  I spoke with patient's son who is at the bedside and husband through the telephone to discuss the severity of patient's illness and recommended the family to be at the bedside in case patient were to become sicker.  At this time patient has good mental status.   [CV]    Clinical Course User Index [CV] Don Perking [place], MD     As part of my medical decision making, I reviewed the following data within the electronic MEDICAL RECORD NUMBER Nursing notes reviewed and incorporated, Labs reviewed , EKG interpreted , Old EKG reviewed, Old chart  reviewed, Radiograph reviewed , Discussed with admitting physician , A consult was requested and obtained from this/these consultant(s) Nephrology, Notes from prior ED visits and [place] Controlled Substance Database    Pertinent labs & imaging results that were available during my care of the patient were reviewed by me and considered in my medical decision making (see chart for details).    ____________________________________________   FINAL CLINICAL IMPRESSION(S) / ED DIAGNOSES  Final diagnoses:  Septic shock (HCC)  Demand ischemia (HCC)  Acute kidney injury superimposed on chronic kidney disease (HCC)      NEW MEDICATIONS STARTED DURING THIS VISIT:  ED Discharge Orders    None       Note:  This document was prepared using Dragon voice recognition software and may include unintentional dictation errors.    Don Perking, [place], MD 06/02/18 [phone]

## 2018-06-02 NOTE — ED Notes (Signed)
Both sets of Blood Cultures drawn prior to antibiotic administration. Dr. Milinda AntisVeranose would like levophed after 2L of normal saline d/t pt underlying conditions.

## 2018-06-02 NOTE — Consult Note (Addendum)
[facility] Consultation / [facility]charge Summary    Name: Regina White MRN: [id number] DOB: 04-18-1956    ADMISSION DATE:  06/02/2018 CONSULTATION DATE: 06/02/2018  REFERRING MD :  Renae GlossWIETING   CHIEF COMPLAINT:   Shortness of breath   HISTORY OF PRESENT ILLNESS   62 year old female with history of group 2 pulmonary arterial hypertension, HFrEF,last calculated ejection fraction at 35%, OSA, hypertension, and venous insufficiency who presents to the emergency department for shortness of breath. Patient states she started having difficulty breathing over night. She also endorses a productive cough with green sputum. She denies fevers/chills and hemoptysis. In the ED patient was sent for CT scan for concern of pulmonary embolism. CT scan showed significant dilation of pulmonary arteries and cardiomegaly which is consistent with her diagnosis of pulmonary hypertension. Also showed patchy infiltrate of right lower lobe with mediastinal adenopathy. In the emergency department patient placed on BiPAP with 100% FiO2. Patient also noted to have creatinine of 6.02 in the ED with BUN of 106. BNP is 1569. VBG was drawn and shows pH of 7.12, CO2 of 37 and O2 of 55. The elevation in creatinine is acute with recent creatinine less than 2. She apparently has had a recent foot infection and has received antibiotic therapy, has a prior history of C. Difficile colitis. Also of note she has a history of chronic pain syndrome and has implantable morphine pump    SIGNIFICANT EVENTS       PAST MEDICAL HISTORY    :  Past Medical History:  Diagnosis Date  . Asthma   . CHF (congestive heart failure) (HCC)   . Chronic back pain   . Pulmonary hypertension (HCC)    Past Surgical History:  Procedure Laterality Date  . BACK SURGERY     Prior to Admission medications   Medication Sig Start Date End Date Taking? Authorizing Provider  BECONASE AQ 42 MCG/SPRAY nasal spray Place 1 spray  into the nose 2 (two) times daily. 05/10/18  Yes [provider]  bisoprolol (ZEBETA) 5 MG tablet Take 2.5 mg by mouth daily. 05/12/18  Yes [provider]  bumetanide (BUMEX) 1 MG tablet Take 2 mg by mouth daily. 05/16/18  Yes [provider]  ENTRESTO 49-51 MG Take 1 tablet by mouth 2 (two) times daily. 05/24/18  Yes [provider]  Eszopiclone 3 MG TABS Take 3 mg by mouth at bedtime.  06/19/10  Yes [provider]  lisinopril (PRINIVIL,ZESTRIL) 20 MG tablet Take 20 mg by mouth daily.   Yes [provider]  montelukast (SINGULAIR) 10 MG tablet Take 10 mg by mouth at bedtime.   Yes [provider]  morphine (MSIR) 15 MG tablet Take 1 tablet by mouth 3 (three) times daily. 05/24/18  Yes [provider]  spironolactone (ALDACTONE) 25 MG tablet Take 25 mg by mouth daily.  02/17/16 06/02/18 Yes [provider]  SYNTHROID 200 MCG tablet Take 1 tablet by mouth daily. 04/23/18  Yes [provider]  zafirlukast (ACCOLATE) 20 MG tablet Take 20 mg by mouth daily.  08/27/16  Yes [provider]  albuterol (PROVENTIL HFA;VENTOLIN HFA) 108 (90 Base) MCG/ACT inhaler Inhale two puffs every six hours if needed for wheezing 10/12/16   [provider]  cyclobenzaprine (FLEXERIL) 10 MG tablet TAKE 1 TABLET BY MOUTH EVERY 8 HOURS AS NEEDED FOR MUSCLE SPASMS 09/06/16   [provider]  ipratropium (ATROVENT HFA) 17 MCG/ACT inhaler Inhale 2 puffs into the  lungs every 4 (four) hours as needed.  02/17/16 02/16/17  [provider]   Allergies  Allergen Reactions  . Erythromycin Nausea And Vomiting and Rash    STOMACH PAIN  . Levofloxacin     Other reaction(s): Other MUSCLE ACHES  . Lidocaine Rash  . Sulfa Antibiotics Rash     FAMILY HISTORY   Family History  Problem Relation Age of Onset  . Hypertension Mother   . Liver cancer Father       SOCIAL HISTORY    reports that she quit smoking  about 18 years ago. Her smoking use included cigarettes. She has a 30.00 pack-year smoking history. She has never used smokeless tobacco. She reports that she does not drink alcohol. Her drug history is not on file.  Review of Systems  Constitutional: Positive for malaise/fatigue. Negative for chills, diaphoresis, fever and weight loss.  HENT: Negative for congestion, ear pain and sore throat.   Respiratory: Positive for cough, sputum production (green) and shortness of breath. Negative for hemoptysis and wheezing.   Cardiovascular: Positive for leg swelling. Negative for chest pain, palpitations and claudication.  Gastrointestinal: Positive for diarrhea. Negative for abdominal pain, constipation, nausea and vomiting.  Genitourinary: Negative for dysuria, frequency and urgency.  Neurological: Negative for dizziness, tremors, loss of consciousness and weakness.      VITAL SIGNS    Temp:  [98.8 F (37.1 C)] 98.8 F (37.1 C) (07/26 1215) Pulse Rate:  [74-94] 75 (07/26 1450) Resp:  [12-20] 15 (07/26 1450) BP: (68-126)/(40-99) 120/62 (07/26 1450) SpO2:  [83 %-100 %] 83 % (07/26 1450) Weight:  [108.9 kg (240 lb)-112.9 kg (248 lb 14.4 oz)] 112.9 kg (248 lb 14.4 oz) (07/26 1535) HEMODYNAMICS:   VENTILATOR SETTINGS:   INTAKE / OUTPUT:  Intake/Output Summary (Last 24 hours) at 06/02/2018 1558 Last data filed at 06/02/2018 1333 Gross per 24 hour  Intake 1410.4 ml  Output -  Net 1410.4 ml       PHYSICAL EXAM   Physical Exam  Constitutional: She is oriented to person, place, and time. She appears distressed.  Obese, anxious female. On BiPAP  HENT:  Head: Normocephalic and atraumatic.  Patient is noted to have swelling over both supraclavicular regions left greater than right. It appears to be soft tissue related, there is no crepitance or palpable subcutaneous air  Eyes: Pupils are equal, round, and reactive to light.  Cardiovascular:  Regular rate and rhythm. S1 and S2. No  murmus, rubs, gallops. Distal pulses intact via doppler.   Pulmonary/Chest:  Bilateral rhonchi with expiratory wheezing bilaterally.   Abdominal:  Soft, non-distended, non tender. Bowel sounds normoactive.   Musculoskeletal: Normal range of motion. She exhibits edema (2+).  Lymphadenopathy:    She has no cervical adenopathy.  Neurological: She is alert and oriented to person, place, and time. No cranial nerve deficit.  Anxious, tearful  Skin: Capillary refill takes less than 2 seconds. There is erythema (of neck and upper chest).  Hyperpigmentation of bilateral lower legs with scattered bruising       LABS   LABS:  CBC Recent Labs  Lab 06/02/18 1207  WBC 6.7  HGB 12.7  HCT 39.5  PLT 168   Coag's No results for input(s): APTT, INR in the last 168 hours. BMET Recent Labs  Lab 06/02/18 1207  NA 136  K 4.8  CL 102  CO2 17*  BUN 106*  CREATININE 6.02*  GLUCOSE 149*   Electrolytes Recent Labs  Lab 06/02/18 1207  CALCIUM 6.4*   Sepsis Markers Recent Labs  Lab 06/02/18 1207  LATICACIDVEN 1.0  PROCALCITON 0.34   ABG No results for input(s): PHART, PCO2ART, PO2ART in the last 168 hours. Liver Enzymes No results for input(s): AST, ALT, ALKPHOS, BILITOT, ALBUMIN in the last 168 hours. Cardiac Enzymes Recent Labs  Lab 06/02/18 1207  TROPONINI 0.28*   Glucose Recent Labs  Lab 06/02/18 1526  GLUCAP 186*     No results found for this or any previous visit (from the past 240 hour(s)).   Current Facility-Administered Medications:  .  [START ON 06/03/2018] ceFEPIme (MAXIPIME) 1 g in sodium chloride 0.9 % 100 mL IVPB, 1 g, Intravenous, Q24H, Wieting, Richard, MD .  Melene Muller ON 06/03/2018] levothyroxine (SYNTHROID, LEVOTHROID) tablet 200 mcg, 200 mcg, Oral, QAC breakfast, Wieting, Richard, MD .  montelukast (SINGULAIR) tablet 10 mg, 10 mg, Oral, QHS, Wieting, Richard, MD .  norepinephrine (LEVOPHED) 4-5 MG/250ML-% infusion SOLN, , , , , Last Rate: 8 mL/hr at  06/02/18 1535 .  vancomycin (VANCOCIN) 500 mg in sodium chloride 0.9 % 100 mL IVPB, 500 mg, Intravenous, Once, Wieting, Richard, MD .  vancomycin variable dose per unstable renal function (pharmacist dosing), , Does not apply, See admin instructions, Alford Highland, MD .  Melene Muller ON 06/03/2018] zafirlukast (ACCOLATE) tablet 20 mg, 20 mg, Oral, Daily, Renae Gloss, Richard, MD  IMAGING    Ct Angio Chest Pe W Or Wo Contrast  Result Date: 06/02/2018 CLINICAL DATA:  Shortness of breath. EXAM: CT ANGIOGRAPHY CHEST WITH CONTRAST TECHNIQUE: Multidetector CT imaging of the chest was performed using the standard protocol during bolus administration of intravenous contrast. Multiplanar CT image reconstructions and MIPs were obtained to evaluate the vascular anatomy. CONTRAST:  75mL ISOVUE-370 IOPAMIDOL (ISOVUE-370) INJECTION 76% COMPARISON:  Chest x-rays dated 06/02/2018 and 12/12/2010 FINDINGS: Cardiovascular: There are no pulmonary emboli. There is marked enlargement of the pulmonary arteries with dilatation of the right atrium and ventricle, consistent with pulmonary arterial hypertension. There is cardiomegaly. Diffuse prominence of the pulmonary vascularity. Mediastinum/Nodes: There is an enlarged precarinal lymph node measuring 2 cm in diameter. There also small lymph nodes in the right hilum and there is a 2 cm subcarinal lymph node. Minimal residual thyroid tissue. Lungs/Pleura: There are small patchy infiltrates in the right lower lobe laterally and posteriorly. There is also a tiny patchy area of infiltrate in the right upper lobe. Slight focal atelectasis in the posterior aspect of the left upper lobe. Upper Abdomen: Cholelithiasis.  Left renal atrophy. Musculoskeletal: No acute abnormalities. Review of the MIP images confirms the above findings. IMPRESSION: 1. No evidence of pulmonary emboli or aortic dissection. 2. Marked enlargement of the pulmonary arteries and right side of the heart consistent with  pulmonary arterial hypertension. Cardiomegaly. 3. Small patchy areas of infiltrate in the right lung. 4. Mediastinal adenopathy of unknown significance. 5. Cholelithiasis. Electronically Signed   By: Francene Boyers M.D.   On: 06/02/2018 13:10   Dg Chest Portable 1 View  Result Date: 06/02/2018 CLINICAL DATA:  Poor historian. Shortness of breath, unable to obtain further history. EXAM: PORTABLE CHEST 1 VIEW COMPARISON:  12/12/2010. FINDINGS: Massive enlargement cardiac silhouette could represent cardiomegaly or pericardial effusion. Mild increased markings, likely vascular congestion. Focal area of increased opacity at the RIGHT base, possible RIGHT lower lobe pneumonia. Suspected RIGHT effusion. IMPRESSION: Massive enlargement cardiac silhouette. Focal RIGHT lower lobe opacity. See discussion above. Electronically Signed   By: Elsie Stain M.D.   On: 06/02/2018 11:38    MICRO  DATA: MRSA PCR : pending Urine: evidence of UTI (+ leukocytes, +bacteria), culture bending  Blood: drawn 7/26  ANTIMICROBIALS: cefepime and vancomycin    ASSESSMENT/PLAN   62 year old female with history of group 2 pulmonary arterial hypertension, HFrEF, OSA, hypertension, and venous insufficiency who presents to the emergency department for shortness of breath. Patient placed on BiPAP. CT scan showed evidence of pneumonia with pulmonary artery hypertension and cardiomegaly. UA shows evidence of UTI. Broad spectrum abx started (cefepime and vancomycin). Patient is on a morphine pain pump for chronic back pain that cannot be shut off.   In the setting of renal failure with potential for hemodialysis and severe pulmonary hypertension agree with family request for transfer to her pulmonary hypertension specialist [facility].Discussed with Dr. Chipper Herb who is on-call for the medical intensive care unit and he has graciously accepted patient in transfer.  Patient will be transferred to [facility] for further  management per patient's request.   PULMONARY A: Chronic type 2 pulmonary hypertension with acute hypoxic respiratory failure P:  Continue BiPAP: on FiO2 100%; hemodynamics are improving, she is able to talk with BiPAP -high risk for intubation Of note on CT scan of the chest she does have appears to be significant tracheobronchomalacia and it appears to be improved on positive pressure ventilation   CARDIOVASCULAR A: HFrEF with cardiomegaly and CHF exacerbation P: Cardiology consult -echo shows EF 35% with dilated right ventricle -continue levophed presently on low dose at 6 g,for pressure support: PICC line to be placed prior to transportation -recent LHC showed nonobstructive coronary artery disease -[id number] -troponin 0.28: secondary to demand ischemia    RENAL A:  Acute Kidney Injury P:  Creatinine 6.02. Nephrology consulted. Likely secondary to IV contrast with hypotension and hemodynamic compromise that has lead to ATN. Recommends deferring hemodialysis until respiratory status stabilized. -hold bisoprolol, entresto, -Foley catheter for urine output monitoring   GASTROINTESTINAL A:  No current concerns    INFECTIOUS A:  Sepsis secondary to pneumonia and UTI P:  On vancomycin and cefepime for empiric abx coverage, continue BCx2 : drawn 7/26 UC: pending 7/26  ENDOCRINE A:  No current concerns   NEUROLOGIC A:  Alert and oriented P:  No current concerns  Will provide actual CT scan of the chest, patient has had an echocardiogram performed and report in care everywhere  Critical care time 45 minutes  Tora Kindred, D.O.

## 2018-06-02 NOTE — Progress Notes (Signed)
CODE SEPSIS - PHARMACY COMMUNICATION  **Broad Spectrum Antibiotics should be administered within 1 hour of Sepsis diagnosis**  Time Code Sepsis Called/Page Received: 1146  Antibiotics Ordered: Cefepime/Vancomycin   Time of 1st antibiotic administration: Cefepime 1208   Additional action taken by pharmacy: none indicated   If necessary, Name of Provider/Nurse Contacted: N/A    Simpson,Michael L ,PharmD Clinical Pharmacist  06/02/2018  12:53 PM

## 2018-06-02 NOTE — Progress Notes (Signed)
Peripherally Inserted Central Catheter/Midline Placement  The IV Nurse has discussed with the patient and/or persons authorized to consent for the patient, the purpose of this procedure and the potential benefits and risks involved with this procedure.  The benefits include less needle sticks, lab draws from the catheter, and the patient may be discharged home with the catheter. Risks include, but not limited to, infection, bleeding, blood clot (thrombus formation), and puncture of an artery; nerve damage and irregular heartbeat and possibility to perform a PICC exchange if needed/ordered by physician.  Alternatives to this procedure were also discussed.  Bard Power PICC patient education guide, fact sheet on infection prevention and patient information card has been provided to patient /or left at bedside.    PICC/Midline Placement Documentation  PICC Triple Lumen 06/02/18 PICC Right Basilic 40 cm 1 cm (Active)  Indication for Insertion or Continuance of Line Vasoactive infusions 06/02/2018  5:25 PM  Exposed Catheter (cm) 1 cm 06/02/2018  5:25 PM  Site Assessment Clean;Dry;Intact 06/02/2018  5:25 PM  Lumen #1 Status Flushed;Blood return noted 06/02/2018  5:25 PM  Lumen #2 Status Flushed;Blood return noted 06/02/2018  5:25 PM  Lumen #3 Status Flushed;Blood return noted 06/02/2018  5:25 PM  Dressing Type Transparent 06/02/2018  5:25 PM  Dressing Status Clean;Dry;Intact;Antimicrobial disc in place 06/02/2018  5:25 PM  Dressing Intervention New dressing 06/02/2018  5:25 PM  Dressing Change Due 06/09/18 06/02/2018  5:25 PM    Cosent signed by Son at bedside per pt preference  Maximino GreenlandLumban, Jeanny Rymer Albarece 06/02/2018, 5:26 PM

## 2018-06-03 MED ORDER — GENERIC EXTERNAL MEDICATION
1.00 | Status: DC
Start: 2018-06-04 — End: 2018-06-03

## 2018-06-03 MED ORDER — ACETAMINOPHEN 500 MG PO TABS
1000.00 | ORAL_TABLET | ORAL | Status: DC
Start: 2018-06-06 — End: 2018-06-03

## 2018-06-03 MED ORDER — PANTOPRAZOLE SODIUM 40 MG PO TBEC
40.00 | DELAYED_RELEASE_TABLET | ORAL | Status: DC
Start: 2018-06-03 — End: 2018-06-03

## 2018-06-03 MED ORDER — GENERIC EXTERNAL MEDICATION
0.00 | Status: DC
Start: ? — End: 2018-06-03

## 2018-06-03 MED ORDER — ONDANSETRON HCL 8 MG PO TABS
4.00 | ORAL_TABLET | ORAL | Status: DC
Start: ? — End: 2018-06-03

## 2018-06-03 MED ORDER — ONDANSETRON HCL 4 MG/2ML IJ SOLN
4.00 | INTRAMUSCULAR | Status: DC
Start: ? — End: 2018-06-03

## 2018-06-03 MED ORDER — GENERIC EXTERNAL MEDICATION
Status: DC
Start: ? — End: 2018-06-03

## 2018-06-03 MED ORDER — METOCLOPRAMIDE HCL 10 MG PO TABS
10.00 | ORAL_TABLET | ORAL | Status: DC
Start: 2018-06-06 — End: 2018-06-03

## 2018-06-03 MED ORDER — HEPARIN SODIUM (PORCINE) 5000 UNIT/ML IJ SOLN
5000.00 | INTRAMUSCULAR | Status: DC
Start: 2018-06-03 — End: 2018-06-03

## 2018-06-03 MED ORDER — GENERIC EXTERNAL MEDICATION
1500.00 | Status: DC
Start: 2018-06-03 — End: 2018-06-03

## 2018-06-03 MED ORDER — LEVOTHYROXINE SODIUM 100 MCG PO TABS
200.00 | ORAL_TABLET | ORAL | Status: DC
Start: 2018-06-03 — End: 2018-06-03

## 2018-06-03 MED ORDER — CALCIUM ACETATE (PHOS BINDER) 667 MG PO CAPS
667.00 | ORAL_CAPSULE | ORAL | Status: DC
Start: 2018-06-03 — End: 2018-06-03

## 2018-06-04 LAB — URINE CULTURE: SPECIAL REQUESTS: NORMAL

## 2018-06-05 DIAGNOSIS — I272 Pulmonary hypertension, unspecified: Secondary | ICD-10-CM

## 2018-06-05 MED ORDER — DICLOFENAC SODIUM 1 % TD GEL
2.00 | TRANSDERMAL | Status: DC
Start: 2018-06-06 — End: 2018-06-05

## 2018-06-05 MED ORDER — CEFEPIME HCL 2 GM/100ML IV SOLN
2.00 | INTRAVENOUS | Status: DC
Start: 2018-06-06 — End: 2018-06-05

## 2018-06-05 MED ORDER — HEPARIN SODIUM (PORCINE) 10000 UNIT/ML IJ SOLN
7500.00 | INTRAMUSCULAR | Status: DC
Start: 2018-06-05 — End: 2018-06-05

## 2018-06-05 MED ORDER — GENERIC EXTERNAL MEDICATION
Status: DC
Start: ? — End: 2018-06-05

## 2018-06-05 MED ORDER — SODIUM BICARBONATE 650 MG PO TABS
650.00 | ORAL_TABLET | ORAL | Status: DC
Start: 2018-06-05 — End: 2018-06-05

## 2018-06-05 MED ORDER — LEVOTHYROXINE SODIUM 150 MCG PO TABS
150.00 | ORAL_TABLET | ORAL | Status: DC
Start: 2018-06-07 — End: 2018-06-05

## 2018-06-05 MED ORDER — FAMOTIDINE 20 MG PO TABS
20.00 | ORAL_TABLET | ORAL | Status: DC
Start: 2018-06-07 — End: 2018-06-05

## 2018-06-05 MED ORDER — PNEUMOCOCCAL VAC POLYVALENT 25 MCG/0.5ML IJ INJ
0.50 | INJECTION | INTRAMUSCULAR | Status: DC
Start: ? — End: 2018-06-05

## 2018-06-05 NOTE — Progress Notes (Signed)
Pulmonary Individual Treatment Plan  Patient Details  Name: Regina White MRN: [id number] Date of Birth: Feb 19, 1956 Referring Provider:     Pulmonary Rehab from 05/01/2018 in [facility] Cardiac and Pulmonary Rehab  Referring Provider  Nolon Stalls MD      Initial Encounter Date:    Pulmonary Rehab from 05/01/2018 in [facility] Cardiac and Pulmonary Rehab  Date  05/01/18      Visit Diagnosis: Pulmonary hypertension ([place])  Patient's Home Medications on Admission: No current outpatient medications on file.  Past Medical History: Past Medical History:  Diagnosis Date  . Asthma   . CHF (congestive heart failure) ([place])   . Chronic back pain   . Pulmonary hypertension ([facility])     Tobacco Use: Social History   Tobacco Use  Smoking Status Former Smoker  . Packs/day: 1.00  . Years: 30.00  . Pack years: 30.00  . Types: Cigarettes  . Last attempt to quit: 06/09/1999  . Years since quitting: 19.0  Smokeless Tobacco Never Used    Labs: Recent Review Scientist, physiological    Labs for ITP Cardiac and Pulmonary Rehab Latest Ref Rng & Units 06/02/2018   HCO3 20.0 - 28.0 mmol/L 12.0(L)   ACIDBASEDEF 0.0 - 2.0 mmol/L 16.5(H)   O2SAT % 75.4       Pulmonary Assessment Scores: Pulmonary Assessment Scores    Row Name 05/01/18 1527         ADL UCSD   ADL Phase  Entry     SOB Score total  39     Rest  1     Walk  1     Stairs  2     Bath  0     Dress  1     Shop  0       CAT Score   CAT Score  28       mMRC Score   mMRC Score  1        Pulmonary Function Assessment: Pulmonary Function Assessment - 05/01/18 1528      Breath   Bilateral Breath Sounds  Clear;Decreased    Shortness of Breath  Yes;Limiting activity       Exercise Target Goals:    Exercise Program Goal: Individual exercise prescription set using results from initial 6 min walk test and THRR while considering  patient's activity barriers and safety.    Exercise Prescription Goal: Initial exercise  prescription builds to 30-45 minutes a day of aerobic activity, 2-3 days per week.  Home exercise guidelines will be given to patient during program as part of exercise prescription that the participant will acknowledge.  Activity Barriers & Risk Stratification: Activity Barriers & Cardiac Risk Stratification - 05/01/18 1628      Activity Barriers & Cardiac Risk Stratification   Activity Barriers  Arthritis;Back Problems;Deconditioning;Muscular Weakness;Shortness of Breath;Balance Concerns;Joint Problems arth in bilateral hips, knees, and ankles       6 Minute Walk: 6 Minute Walk    Row Name 05/01/18 1617         6 Minute Walk   Phase  Initial NuStep Test     Distance  419 feet steps     Walk Time  6 minutes     # of Rest Breaks  0     METS  1.3     RPE  13     Perceived Dyspnea   2     Symptoms  No     Resting HR  78 bpm  Resting BP  126/64     Resting Oxygen Saturation   91 % initial resting was 84%      Exercise Oxygen Saturation  during 6 min walk  83 %     Max Ex. HR  99 bpm     Max Ex. BP  138/70     2 Minute Post BP  130/68       Interval HR   1 Minute HR  77     2 Minute HR  86     3 Minute HR  83     4 Minute HR  90     5 Minute HR  99     6 Minute HR  96     2 Minute Post HR  77     Interval Heart Rate?  Yes       Interval Oxygen   Interval Oxygen?  Yes  (Significant)  Sats after ambulating to and from scale was 71%, recovered to 91% after 3 min     Baseline Oxygen Saturation %  91 %     1 Minute Oxygen Saturation %  92 %     1 Minute Liters of Oxygen  0 L Room Air     2 Minute Oxygen Saturation %  94 %     2 Minute Liters of Oxygen  0 L     3 Minute Oxygen Saturation %  89 %     3 Minute Liters of Oxygen  0 L     4 Minute Oxygen Saturation %  84 %     4 Minute Liters of Oxygen  0 L     5 Minute Oxygen Saturation %  86 %     5 Minute Liters of Oxygen  0 L     6 Minute Oxygen Saturation %  83 %     6 Minute Liters of Oxygen  0 L     2 Minute Post  Oxygen Saturation %  93 %     2 Minute Post Liters of Oxygen  0 L       Oxygen Initial Assessment: Oxygen Initial Assessment - 05/01/18 1529      Home Oxygen   Home Oxygen Device  None    Sleep Oxygen Prescription  None    Home Exercise Oxygen Prescription  None    Home at Rest Exercise Oxygen Prescription  None      Initial 6 min Walk   Oxygen Used  None      Program Oxygen Prescription   Program Oxygen Prescription  None      Intervention   Short Term Goals  To learn and understand importance of monitoring SPO2 with pulse oximeter and demonstrate accurate use of the pulse oximeter.;To learn and understand importance of maintaining oxygen saturations>88%;To learn and demonstrate proper pursed lip breathing techniques or other breathing techniques.;To learn and demonstrate proper use of respiratory medications    Long  Term Goals  Demonstrates proper use of MDI's;Compliance with respiratory medication;Exhibits proper breathing techniques, such as pursed lip breathing or other method taught during program session;Maintenance of O2 saturations>88%;Verbalizes importance of monitoring SPO2 with pulse oximeter and return demonstration       Oxygen Re-Evaluation: Oxygen Re-Evaluation    Row Name 05/08/18 1507 05/15/18 1357           Program Oxygen Prescription   Program Oxygen Prescription  None  Continuous      Liters per minute  -  1      Comments  -  1-2 Liters prn        Home Oxygen   Home Oxygen Device  None  None      Sleep Oxygen Prescription  None  None      Home Exercise Oxygen Prescription  None  None      Home at Rest Exercise Oxygen Prescription  None  None        Goals/Expected Outcomes   Short Term Goals  To learn and understand importance of monitoring SPO2 with pulse oximeter and demonstrate accurate use of the pulse oximeter.;To learn and understand importance of maintaining oxygen saturations>88%;To learn and demonstrate proper pursed lip breathing techniques  or other breathing techniques.;To learn and demonstrate proper use of respiratory medications  To learn and understand importance of monitoring SPO2 with pulse oximeter and demonstrate accurate use of the pulse oximeter.;To learn and understand importance of maintaining oxygen saturations>88%;To learn and demonstrate proper pursed lip breathing techniques or other breathing techniques.;To learn and demonstrate proper use of respiratory medications      Long  Term Goals  Demonstrates proper use of MDI's;Compliance with respiratory medication;Exhibits proper breathing techniques, such as pursed lip breathing or other method taught during program session;Maintenance of O2 saturations>88%;Verbalizes importance of monitoring SPO2 with pulse oximeter and return demonstration  Demonstrates proper use of MDI's;Compliance with respiratory medication;Exhibits proper breathing techniques, such as pursed lip breathing or other method taught during program session;Maintenance of O2 saturations>88%;Verbalizes importance of monitoring SPO2 with pulse oximeter and return demonstration      Comments  Reviewed PLB technique with pt.  Talked about how it work and it's important to maintaining his exercise saturations.    Regina White does not want to have oxygen at home but may need it. We are working with her in [place] to gauge her oxygen on exertion. She states she is 92 percent at home while resting. Informed her that she should take her oxygen when she is moving around to make sure her oxygen is not dropping below 88 percent. She is taking her nebulizers and inhaler as prescribed at home.      Goals/Expected Outcomes  Short: Become more profiecient at using PLB.   Long: Become independent at using PLB.  Short: monitor oxygen at home with exertion. Long: maintain oxygen saturations above 88 percent independently.         Oxygen Discharge (Final Oxygen Re-Evaluation): Oxygen Re-Evaluation - 05/15/18 1357      Program  Oxygen Prescription   Program Oxygen Prescription  Continuous    Liters per minute  1    Comments  1-2 Liters prn      Home Oxygen   Home Oxygen Device  None    Sleep Oxygen Prescription  None    Home Exercise Oxygen Prescription  None    Home at Rest Exercise Oxygen Prescription  None      Goals/Expected Outcomes   Short Term Goals  To learn and understand importance of monitoring SPO2 with pulse oximeter and demonstrate accurate use of the pulse oximeter.;To learn and understand importance of maintaining oxygen saturations>88%;To learn and demonstrate proper pursed lip breathing techniques or other breathing techniques.;To learn and demonstrate proper use of respiratory medications    Long  Term Goals  Demonstrates proper use of MDI's;Compliance with respiratory medication;Exhibits proper breathing techniques, such as pursed lip breathing or other method taught during program session;Maintenance of O2 saturations>88%;Verbalizes importance of monitoring SPO2 with pulse oximeter and return demonstration  Comments  Regina White does not want to have oxygen at home but may need it. We are working with her in [place] to gauge her oxygen on exertion. She states she is 92 percent at home while resting. Informed her that she should take her oxygen when she is moving around to make sure her oxygen is not dropping below 88 percent. She is taking her nebulizers and inhaler as prescribed at home.    Goals/Expected Outcomes  Short: monitor oxygen at home with exertion. Long: maintain oxygen saturations above 88 percent independently.       Initial Exercise Prescription: Initial Exercise Prescription - 05/01/18 1600      Date of Initial Exercise RX and Referring Provider   Date  05/01/18    Referring Provider  Nolon Stalls MD      Oxygen   Oxygen  Continuous    Liters  1      Arm Ergometer   Level  1    RPM  25    Minutes  15    METs  1.3      T5 Nustep   Level  1    SPM  80     Minutes  15    METs  1.3      Biostep-RELP   Level  1    SPM  50    Minutes  15    METs  2      Prescription Details   Frequency (times per week)  3    Duration  Progress to 45 minutes of aerobic exercise without signs/symptoms of physical distress      Intensity   THRR 40-80% of Max Heartrate  110-141    Ratings of Perceived Exertion  11-13    Perceived Dyspnea  0-4      Progression   Progression  Continue to progress workloads to maintain intensity without signs/symptoms of physical distress.      Resistance Training   Training Prescription  Yes    Weight  3 lbs    Reps  10-15       Perform Capillary Blood Glucose checks as needed.  Exercise Prescription Changes: Exercise Prescription Changes    Row Name 05/01/18 1600 05/09/18 1300 05/23/18 1500         Response to Exercise   Blood Pressure (Admit)  126/64  118/68  120/80     Blood Pressure (Exercise)  138/70  106/68  -     Blood Pressure (Exit)  130/68  120/56  114/72     Heart Rate (Admit)  78 bpm  77 bpm  88 bpm     Heart Rate (Exercise)  99 bpm  82 bpm  93 bpm     Heart Rate (Exit)  77 bpm  90 bpm  84 bpm     Oxygen Saturation (Admit)  91 %  92 %  83 %     Oxygen Saturation (Exercise)  83 %  89 %  91 %     Oxygen Saturation (Exit)  93 %  97 %  97 %     Rating of Perceived Exertion (Exercise)  13  13  15      Perceived Dyspnea (Exercise)  2  0  0     Symptoms  none  fatigue  fatigue     Comments  NuStep test results  first full day of exercise  -     Duration  -  Progress to 45 minutes of aerobic exercise  without signs/symptoms of physical distress  Progress to 45 minutes of aerobic exercise without signs/symptoms of physical distress     Intensity  -  THRR unchanged  THRR unchanged       Progression   Progression  -  Continue to progress workloads to maintain intensity without signs/symptoms of physical distress.  Continue to progress workloads to maintain intensity without signs/symptoms of physical  distress.     Average METs  -  1.4  1.7       Resistance Training   Training Prescription  -  Yes  Yes     Weight  -  2 lbs  2 lbs     Reps  -  10-15  10-15       Interval Training   Interval Training  -  -  No       Oxygen   Oxygen  -  Continuous  Continuous     Liters  -  4 PRN staff error  2       NuStep   Level  -  -  1     Minutes  -  -  15     METs  -  -  1.9       Arm Ergometer   Level  -  1  2     RPM  -  15  -     Minutes  -  15  15     METs  -  1.2  1.4       T5 Nustep   Level  -  1  1     SPM  -  77  -     Minutes  -  15  15     METs  -  1.3  1.8       Biostep-RELP   Level  -  1  -     SPM  -  83  -     Minutes  -  15  -        Exercise Comments: Exercise Comments    Row Name 05/08/18 1507           Exercise Comments   First full day of exercise!  Patient was oriented to gym and equipment including functions, settings, policies, and procedures.  Patient's individual exercise prescription and treatment plan were reviewed.  All starting workloads were established based on the results of the 6 minute walk test done at initial orientation visit.  The plan for exercise progression was also introduced and progression will be customized based on patient's performance and goals.          Exercise Goals and Review: Exercise Goals    Row Name 05/01/18 1632             Exercise Goals   Increase Physical Activity  Yes       Intervention  Provide advice, education, support and counseling about physical activity/exercise needs.;Develop an individualized exercise prescription for aerobic and resistive training based on initial evaluation findings, risk stratification, comorbidities and participant's personal goals.       Expected Outcomes  Short Term: Attend rehab on a regular basis to increase amount of physical activity.;Long Term: Add in home exercise to make exercise part of routine and to increase amount of physical activity.;Long Term: Exercising  regularly at least 3-5 days a week.       Increase Strength and Stamina  Yes  Intervention  Provide advice, education, support and counseling about physical activity/exercise needs.;Develop an individualized exercise prescription for aerobic and resistive training based on initial evaluation findings, risk stratification, comorbidities and participant's personal goals.       Expected Outcomes  Short Term: Increase workloads from initial exercise prescription for resistance, speed, and METs.;Short Term: Perform resistance training exercises routinely during rehab and add in resistance training at home;Long Term: Improve cardiorespiratory fitness, muscular endurance and strength as measured by increased METs and functional capacity (6MWT)       Able to understand and use rate of perceived exertion (RPE) scale  Yes       Intervention  Provide education and explanation on how to use RPE scale       Expected Outcomes  Short Term: Able to use RPE daily in rehab to express subjective intensity level;Long Term:  Able to use RPE to guide intensity level when exercising independently       Able to understand and use Dyspnea scale  Yes       Intervention  Provide education and explanation on how to use Dyspnea scale       Expected Outcomes  Short Term: Able to use Dyspnea scale daily in rehab to express subjective sense of shortness of breath during exertion;Long Term: Able to use Dyspnea scale to guide intensity level when exercising independently       Knowledge and understanding of Target Heart Rate Range (THRR)  Yes       Intervention  Provide education and explanation of THRR including how the numbers were predicted and where they are located for reference       Expected Outcomes  Short Term: Able to state/look up THRR;Short Term: Able to use daily as guideline for intensity in rehab;Long Term: Able to use THRR to govern intensity when exercising independently       Able to check pulse independently  Yes        Intervention  Provide education and demonstration on how to check pulse in carotid and radial arteries.;Review the importance of being able to check your own pulse for safety during independent exercise       Expected Outcomes  Short Term: Able to explain why pulse checking is important during independent exercise;Long Term: Able to check pulse independently and accurately       Understanding of Exercise Prescription  Yes       Intervention  Provide education, explanation, and written materials on patient's individual exercise prescription       Expected Outcomes  Short Term: Able to explain program exercise prescription;Long Term: Able to explain home exercise prescription to exercise independently          Exercise Goals Re-Evaluation : Exercise Goals Re-Evaluation    Row Name 05/08/18 1507 05/23/18 1513           Exercise Goal Re-Evaluation   Exercise Goals Review  Understanding of Exercise Prescription;Able to understand and use Dyspnea scale;Knowledge and understanding of Target Heart Rate Range (THRR);Able to understand and use rate of perceived exertion (RPE) scale  Increase Physical Activity;Understanding of Exercise Prescription;Increase Strength and Stamina      Comments  Reviewed RPE scale, THR and program prescription with pt today.  Pt voiced understanding and was given a copy of goals to take home.   Regina White has been doing well in rehab.  She refuses to walk do to back pain and is thus on all seated equipment.  She has moved up to  level 2 on the arm crank.  We will continue to monitor her progress.      Expected Outcomes  Short: Use RPE daily to regulate intensity.  Long: Follow program prescription in THR.  Short: Increase workloads on steppers.  Long: Continue to follow program prescription.          Discharge Exercise Prescription (Final Exercise Prescription Changes): Exercise Prescription Changes - 05/23/18 1500      Response to Exercise   Blood Pressure (Admit)   120/80    Blood Pressure (Exit)  114/72    Heart Rate (Admit)  88 bpm    Heart Rate (Exercise)  93 bpm    Heart Rate (Exit)  84 bpm    Oxygen Saturation (Admit)  83 %    Oxygen Saturation (Exercise)  91 %    Oxygen Saturation (Exit)  97 %    Rating of Perceived Exertion (Exercise)  15    Perceived Dyspnea (Exercise)  0    Symptoms  fatigue    Duration  Progress to 45 minutes of aerobic exercise without signs/symptoms of physical distress    Intensity  THRR unchanged      Progression   Progression  Continue to progress workloads to maintain intensity without signs/symptoms of physical distress.    Average METs  1.7      Resistance Training   Training Prescription  Yes    Weight  2 lbs    Reps  10-15      Interval Training   Interval Training  No      Oxygen   Oxygen  Continuous    Liters  2      NuStep   Level  1    Minutes  15    METs  1.9      Arm Ergometer   Level  2    Minutes  15    METs  1.4      T5 Nustep   Level  1    Minutes  15    METs  1.8       Nutrition:  Target Goals: Understanding of nutrition guidelines, daily intake of sodium <1511m, cholesterol <2075m calories 30% from fat and 7% or less from saturated fats, daily to have 5 or more servings of fruits and vegetables.  Biometrics: Pre Biometrics - 05/01/18 1632      Pre Biometrics   Height  5' 0.1" (1.527 m)    Weight  232 lb (105.2 kg)    Waist Circumference  45 inches    Hip Circumference  54 inches    Waist to Hip Ratio  0.83 %    BMI (Calculated)  45.13        Nutrition Therapy Plan and Nutrition Goals: Nutrition Therapy & Goals - 05/01/18 1525      Personal Nutrition Goals   Nutrition Goal  Lose weight    Personal Goal #2  She wants to learn more of what she is eating    Comments  She would like to meet with the dietician to learn how to eat better and lose weight.      Intervention Plan   Intervention  Prescribe, educate and counsel regarding individualized specific  dietary modifications aiming towards targeted core components such as weight, hypertension, lipid management, diabetes, heart failure and other comorbidities.    Expected Outcomes  Short Term Goal: Understand basic principles of dietary content, such as calories, fat, sodium, cholesterol and nutrients.;Long Term Goal: Adherence to prescribed  nutrition plan.       Nutrition Assessments: Nutrition Assessments - 05/01/18 1525      MEDFICTS Scores   Pre Score  19       Nutrition Goals Re-Evaluation: Nutrition Goals Re-Evaluation    [place] Name 05/15/18 1403             Goals   Current Weight  231 lb (104.8 kg)       Nutrition Goal  Lose weight, eat healthier and meet with the dietician.       Comment  Regina White wants to lose weight but was apprehensive to meet witht the dietician. Informed her that it is important to meet with the dietician and have a balanced diet when having breathing difficulty. Informed Regina White to schedule an appointment for her.       Expected Outcome  Short: Meet with the dietician. Long: Adhere to a diet plan.          Nutrition Goals Discharge (Final Nutrition Goals Re-Evaluation): Nutrition Goals Re-Evaluation - 05/15/18 1403      Goals   Current Weight  231 lb (104.8 kg)    Nutrition Goal  Lose weight, eat healthier and meet with the dietician.    Comment  Regina White wants to lose weight but was apprehensive to meet witht the dietician. Informed her that it is important to meet with the dietician and have a balanced diet when having breathing difficulty. Informed Regina White to schedule an appointment for her.    Expected Outcome  Short: Meet with the dietician. Long: Adhere to a diet plan.       Psychosocial: Target Goals: Acknowledge presence or absence of significant depression and/or stress, maximize coping skills, provide positive support system. Participant is able to verbalize types and ability to use techniques and skills needed for reducing stress and  depression.   Initial Review & Psychosocial Screening: Initial Psych Review & Screening - 05/01/18 1520      Initial Review   Current issues with  Current Sleep Concerns;Current Stress Concerns    Source of Stress Concerns  Chronic Illness;Unable to perform yard/household activities;Unable to participate in former interests or hobbies    Comments  She is frustrated with not being able to move and walk well. She cant do household duties like she used to.      Family Dynamics   Good Support System?  Yes    Comments  she can look to her husband, dog and daughter for support.       Barriers   Psychosocial barriers to participate in program  The patient should benefit from training in stress management and relaxation.      Screening Interventions   Interventions  Encouraged to exercise;Program counselor consult;To provide support and resources with identified psychosocial needs;Provide feedback about the scores to participant    Expected Outcomes  Short Term goal: Utilizing psychosocial counselor, staff and physician to assist with identification of specific Stressors or current issues interfering with healing process. Setting desired goal for each stressor or current issue identified.;Long Term Goal: Stressors or current issues are controlled or eliminated.;Short Term goal: Identification and review with participant of any Quality of Life or Depression concerns found by scoring the questionnaire.;Long Term goal: The participant improves quality of Life and PHQ9 Scores as seen by post scores and/or verbalization of changes       Quality of Life Scores:  Scores of 19 and below usually indicate a poorer quality of life in these areas.  A  difference of  2-3 points is a clinically meaningful difference.  A difference of 2-3 points in the total score of the Quality of Life Index has been associated with significant improvement in overall quality of life, self-image, physical symptoms, and general  health in studies assessing change in quality of life.  PHQ-9: Recent Review Flowsheet Data    Depression screen [facility] 2/9 05/01/2018   Decreased Interest 2   Down, Depressed, Hopeless 1   PHQ - 2 Score 3   Altered sleeping 3   Tired, decreased energy 1   Change in appetite 0   Feeling bad or failure about yourself  1   Trouble concentrating 0   Moving slowly or fidgety/restless 0   Suicidal thoughts 0   PHQ-9 Score 8   Difficult doing work/chores Very difficult     Interpretation of Total Score  Total Score Depression Severity:  1-4 = Minimal depression, 5-9 = Mild depression, 10-14 = Moderate depression, 15-19 = Moderately severe depression, 20-27 = Severe depression   Psychosocial Evaluation and Intervention: Psychosocial Evaluation - 05/22/18 1237      Psychosocial Evaluation & Interventions   Interventions  Encouraged to exercise with the program and follow exercise prescription;Stress management education    Comments  Counselor met with Regina White)  today for initial psychosocial evaluation.  She is a 62 year old who has been diagnosed with pulmonary hypertension approx. 1 year ago.  Regina White has a strong support system with a spouse of 8 years and (3) adult children who live locally.  She has multiple health issues with over 14 back surgeries causing chronic pain and attends a pain White for relief.  As a result Regina White does not sleep well (pain related) with approximately 3 hours per night - although she is on medications for this.  Regina White has a decent appetite and denies a history of depression or anxiety or any current symptoms.  Her mood is generally stable per her report and her health is her primary stressor.  Regina White has goals to breathe better while in this program.  Staff will follow with her.     Expected Outcomes  Short:  Regina White will exercise consistently to help with pain and sleep issues.   Long:  Regina White will decrease her stress by use of positive coping strategies -  including exercise.      Continue Psychosocial Services   Follow up required by staff       Psychosocial Re-Evaluation: Psychosocial Re-Evaluation    Howell Name 05/15/18 1407             Psychosocial Re-Evaluation   Current issues with  Current Stress Concerns;Current Sleep Concerns       Comments  Most of Regina White's stress is from her not being able to do things that she used to do. She cannot move around very well and that makes her stressed out. When asked about her home life she was teary eyed and quiet about it. Informed her that the mental health conselor is on staff to talk to if she needs.        Expected Outcomes  Short: Attend [facility] to decrease stress. Long: Maintain exercise Post [facility] to keep stress at a minimum.       Interventions  Encouraged to attend Pulmonary Rehabilitation for the exercise       Continue Psychosocial Services   Follow up required by counselor          Psychosocial Discharge (Final Psychosocial Re-Evaluation):  Psychosocial Re-Evaluation - 05/15/18 1407      Psychosocial Re-Evaluation   Current issues with  Current Stress Concerns;Current Sleep Concerns    Comments  Most of Regina White's stress is from her not being able to do things that she used to do. She cannot move around very well and that makes her stressed out. When asked about her home life she was teary eyed and quiet about it. Informed her that the mental health conselor is on staff to talk to if she needs.     Expected Outcomes  Short: Attend [facility] to decrease stress. Long: Maintain exercise Post [facility] to keep stress at a minimum.    Interventions  Encouraged to attend Pulmonary Rehabilitation for the exercise    Continue Psychosocial Services   Follow up required by counselor       Education: Education Goals: Education classes will be provided on a weekly basis, covering required topics. Participant will state understanding/return demonstration of topics  presented.  Learning Barriers/Preferences: Learning Barriers/Preferences - 05/01/18 1528      Learning Barriers/Preferences   Learning Barriers  None    Learning Preferences  None       Education Topics:  Initial Evaluation Education: - Verbal, written and demonstration of respiratory meds, oximetry and breathing techniques. Instruction on use of nebulizers and MDIs and importance of monitoring MDI activations.   Pulmonary Rehab from 05/24/2018 in [facility] Cardiac and Pulmonary Rehab  Date  05/01/18  Educator  [facility]  Instruction Review Code  1- Verbalizes Understanding      General Nutrition Guidelines/Fats and Fiber: -Group instruction provided by verbal, written material, models and posters to present the general guidelines for heart healthy nutrition. Gives an explanation and review of dietary fats and fiber.   Pulmonary Rehab from 05/24/2018 in [facility] Cardiac and Pulmonary Rehab  Date  05/22/18  Educator  [facility]  Instruction Review Code  1- Verbalizes Understanding      Controlling Sodium/Reading Food Labels: -Group verbal and written material supporting the discussion of sodium use in heart healthy nutrition. Review and explanation with models, verbal and written materials for utilization of the food label.   Exercise Physiology & General Exercise Guidelines: - Group verbal and written instruction with models to review the exercise physiology of the cardiovascular system and associated critical values. Provides general exercise guidelines with specific guidelines to those with heart or lung disease.    Aerobic Exercise & Resistance Training: - Gives group verbal and written instruction on the various components of exercise. Focuses on aerobic and resistive training programs and the benefits of this training and how to safely progress through these programs.   Pulmonary Rehab from 05/24/2018 in [facility] Cardiac and Pulmonary Rehab  Date  05/24/18  Educator  [facility]  Instruction Review Code  1-  Verbalizes Understanding      Flexibility, Balance, Mind/Body Relaxation: Provides group verbal/written instruction on the benefits of flexibility and balance training, including mind/body exercise modes such as yoga, pilates and tai chi.  Demonstration and skill practice provided.   Stress and Anxiety: - Provides group verbal and written instruction about the health risks of elevated stress and causes of high stress.  Discuss the correlation between heart/lung disease and anxiety and treatment options. Review healthy ways to manage with stress and anxiety.   Depression: - Provides group verbal and written instruction on the correlation between heart/lung disease and depressed mood, treatment options, and the stigmas associated with seeking treatment.   Pulmonary Rehab from 05/24/2018 in [facility]  Cardiac and Pulmonary Rehab  Date  05/17/18  Educator  [facility]  Instruction Review Code  1- Verbalizes Understanding      Exercise & Equipment Safety: - Individual verbal instruction and demonstration of equipment use and safety with use of the equipment.   Pulmonary Rehab from 05/24/2018 in [facility] Cardiac and Pulmonary Rehab  Date  05/01/18  Educator  [facility]  Instruction Review Code  1- Verbalizes Understanding      Infection Prevention: - Provides verbal and written material to individual with discussion of infection control including proper hand washing and proper equipment cleaning during exercise session.   Pulmonary Rehab from 05/24/2018 in [facility] Cardiac and Pulmonary Rehab  Date  05/01/18  Educator  [facility]  Instruction Review Code  1- Verbalizes Understanding      Falls Prevention: - Provides verbal and written material to individual with discussion of falls prevention and safety.   Pulmonary Rehab from 05/24/2018 in [facility] Cardiac and Pulmonary Rehab  Date  05/01/18  Educator  [facility]  Instruction Review Code  1- Verbalizes Understanding      Diabetes: - Individual verbal and written instruction to  review signs/symptoms of diabetes, desired ranges of glucose level fasting, after meals and with exercise. Advice that pre and post exercise glucose checks will be done for 3 sessions at entry of program.   Chronic Lung Diseases: - Group verbal and written instruction to review updates, respiratory medications, advancements in procedures and treatments. Discuss use of supplemental oxygen including available portable oxygen systems, continuous and intermittent flow rates, concentrators, personal use and safety guidelines. Review proper use of inhaler and spacers. Provide informative websites for self-education.    Energy Conservation: - Provide group verbal and written instruction for methods to conserve energy, plan and organize activities. Instruct on pacing techniques, use of adaptive equipment and posture/positioning to relieve shortness of breath.   Triggers and Exacerbations: - Group verbal and written instruction to review types of environmental triggers and ways to prevent exacerbations. Discuss weather changes, air quality and the benefits of nasal washing. Review warning signs and symptoms to help prevent infections. Discuss techniques for effective airway clearance, coughing, and vibrations.   AED/CPR: - Group verbal and written instruction with the use of models to demonstrate the basic use of the AED with the basic ABC's of resuscitation.   Anatomy and Physiology of the Lungs: - Group verbal and written instruction with the use of models to provide basic lung anatomy and physiology related to function, structure and complications of lung disease.   Anatomy & Physiology of the Heart: - Group verbal and written instruction and models provide basic cardiac anatomy and physiology, with the coronary electrical and arterial systems. Review of Valvular disease and Heart Failure   Cardiac Medications: - Group verbal and written instruction to review commonly prescribed medications for  heart disease. Reviews the medication, class of the drug, and side effects.   Know Your Numbers and Risk Factors: -Group verbal and written instruction about important numbers in your health.  Discussion of what are risk factors and how they play a role in the disease process.  Review of Cholesterol, Blood Pressure, Diabetes, and BMI and the role they play in your overall health.   Pulmonary Rehab from 05/24/2018 in [facility] Cardiac and Pulmonary Rehab  Date  05/10/18  Educator  [facility]  Instruction Review Code  1- Verbalizes Understanding      Sleep Hygiene: -Provides group verbal and written instruction about how sleep can affect your health.  Define sleep hygiene, discuss sleep cycles and impact of sleep habits. Review good sleep hygiene tips.    Other: -Provides group and verbal instruction on various topics (see comments)    Knowledge Questionnaire Score: Knowledge Questionnaire Score - 05/01/18 1528      Knowledge Questionnaire Score   Pre Score  15/18 reviewed with patient        Core Components/Risk Factors/Patient Goals at Admission: Personal Goals and Risk Factors at Admission - 05/01/18 1530      Core Components/Risk Factors/Patient Goals on Admission    Weight Management  Yes;Obesity;Weight Loss    Intervention  Weight Management: Develop a combined nutrition and exercise program designed to reach desired caloric intake, while maintaining appropriate intake of nutrient and fiber, sodium and fats, and appropriate energy expenditure required for the weight goal.;Weight Management: Provide education and appropriate resources to help participant work on and attain dietary goals.;Weight Management/Obesity: Establish reasonable short term and long term weight goals.;Obesity: Provide education and appropriate resources to help participant work on and attain dietary goals.    Admit Weight  232 lb (105.2 kg)    Goal Weight: Short Term  227 lb (103 kg)    Goal Weight: Long Term  180 lb  (81.6 kg)    Expected Outcomes  Short Term: Continue to assess and modify interventions until short term weight is achieved;Long Term: Adherence to nutrition and physical activity/exercise program aimed toward attainment of established weight goal;Weight Maintenance: Understanding of the daily nutrition guidelines, which includes 25-35% calories from fat, 7% or less cal from saturated fats, less than 279m cholesterol, less than 1.5gm of sodium, & 5 or more servings of fruits and vegetables daily;Weight Loss: Understanding of general recommendations for a balanced deficit meal plan, which promotes 1-2 lb weight loss per week and includes a negative energy balance of [phone] kcal/d;Understanding recommendations for meals to include 15-35% energy as protein, 25-35% energy from fat, 35-60% energy from carbohydrates, less than 2024mof dietary cholesterol, 20-35 gm of total fiber daily;Understanding of distribution of calorie intake throughout the day with the consumption of 4-5 meals/snacks    Improve shortness of breath with ADL's  Yes    Intervention  Provide education, individualized exercise plan and daily activity instruction to help decrease symptoms of SOB with activities of daily living.    Expected Outcomes  Short Term: Improve cardiorespiratory fitness to achieve a reduction of symptoms when performing ADLs;Long Term: Be able to perform more ADLs without symptoms or delay the onset of symptoms    Heart Failure  Yes    Intervention  Provide a combined exercise and nutrition program that is supplemented with education, support and counseling about heart failure. Directed toward relieving symptoms such as shortness of breath, decreased exercise tolerance, and extremity edema.    Expected Outcomes  Improve functional capacity of life;Short term: Attendance in program 2-3 days a week with increased exercise capacity. Reported lower sodium intake. Reported increased fruit and vegetable intake. Reports  medication compliance.;Short term: Daily weights obtained and reported for increase. Utilizing diuretic protocols set by physician.;Long term: Adoption of self-care skills and reduction of barriers for early signs and symptoms recognition and intervention leading to self-care maintenance.    Hypertension  Yes    Intervention  Provide education on lifestyle modifcations including regular physical activity/exercise, weight management, moderate sodium restriction and increased consumption of fresh fruit, vegetables, and low fat dairy, alcohol moderation, and smoking cessation.;Monitor prescription use compliance.    Expected Outcomes  Short Term:  Continued assessment and intervention until BP is < 140/29m HG in hypertensive participants. < 130/88mHG in hypertensive participants with diabetes, heart failure or chronic kidney disease.;Long Term: Maintenance of blood pressure at goal levels.       Core Components/Risk Factors/Patient Goals Review:  Goals and Risk Factor Review    Row Name 05/15/18 1351             Core Components/Risk Factors/Patient Goals Review   Personal Goals Review  Weight Management/Obesity;Heart Failure;Hypertension;Improve shortness of breath with ADL's       Review  CaKalliopeas been doing well in [facility] so far. She wants to lose weight and be able to do things at home easier. Her weight has been hard on her knees and limiting her mobility.       Expected Outcomes  Short: attened [facility] regularly to lose weight. Long: keep weight off to be more mobile at home.          Core Components/Risk Factors/Patient Goals at Discharge (Final Review):  Goals and Risk Factor Review - 05/15/18 1351      Core Components/Risk Factors/Patient Goals Review   Personal Goals Review  Weight Management/Obesity;Heart Failure;Hypertension;Improve shortness of breath with ADL's    Review  CaSantasiaas been doing well in [facility] so far. She wants to lose weight and be able to do  things at home easier. Her weight has been hard on her knees and limiting her mobility.    Expected Outcomes  Short: attened [facility] regularly to lose weight. Long: keep weight off to be more mobile at home.       ITP Comments: ITP Comments    Row Name 05/01/18 1536 05/08/18 1530 06/05/18 0907       ITP Comments  Medical Evaluation completed. Chart sent for review and changes to Dr. MaEmily Filbertirector of [place]osis can be found in [facility] encounter 07/13/16   30 day review completed. ITP sent to Dr. MaEmily Filbertirector of [place] unless changes are made by physician   30 day review completed. ITP sent to Dr. MaEmily Filbertirector of [place]err[place]Continue with ITP unless changes are made by physician        Comments: 30 day review

## 2018-06-05 NOTE — Discharge Summary (Signed)
Physician Discharge Summary  Patient ID: Regina White MRN: [id number] DOB/AGE: [birth year] 64 y.o.  Admit date: 06/02/2018 Discharge date: 06/05/2018  Admission Diagnoses: respiratory failure/renal failure  Discharge Diagnoses:  Active Problems:   Acute respiratory failure with hypoxia [facility](HCC)   Discharged Condition: stabilized  Hospital Course: 62 year old female with history of group 2 pulmonary arterial hypertension, HFrEF,last calculated ejection fraction at 35%, OSA, hypertension, and venous insufficiency who presents to the emergency department for shortness of breath. Patient states she started having difficulty breathing over night. She also endorses a productive cough with green sputum. She denies fevers/chills and hemoptysis. In the ED patient was sent for CT scan for concern of pulmonary embolism. CT scan showed significant dilation of pulmonary arteries and cardiomegaly which is consistent with her diagnosis of pulmonary hypertension. Also showed patchy infiltrate of right lower lobe with mediastinal adenopathy. In the emergency department patient placed on BiPAP with 100% FiO2. Patient also noted to have creatinine of 6.02 in the ED with BUN of 106. BNP is 1569. VBG was drawn and shows pH of 7.12, CO2 of 37 and O2 of 55. The elevation in creatinine is acute with recent creatinine less than 2. She apparently has had a recent foot infection and has received antibiotic therapy, has a prior history of C. Difficile colitis. Also of note she has a history of chronic pain syndrome and has implantable morphine pump In the setting of renal failure with potential  hemodialysis and severe pulmonary hypertension agree with family request [facility].Discussed with Dr. Chipper HerbShannon Carson who is on-call for the medical intensive care unit and he has graciously accepted patient in transfer.  [facility] per patient's request.    Discharge Exam: Blood pressure 98/60, pulse 77, temperature (!) 96.7 F (35.9 C), temperature source Axillary, resp. rate 14, height 5\' 5"  (1.651 m), weight 248 lb 14.4 oz (112.9 kg), SpO2 99 %.  Physical Exam  Constitutional: She is oriented to person, place, and time..  Obese, anxious female. On BiPAP  HENT:  Head: Normocephalic and atraumatic.  Patient is noted to have swelling over both supraclavicular regions left greater than right. It appears to be soft tissue related, there is no crepitance or palpable subcutaneous air  Eyes: Pupils are equal, round, and reactive to light.  Cardiovascular:  Regular rate and rhythm. S1 and S2. No murmus, rubs, gallops. Distal pulses intact via doppler.   Pulmonary/Chest:  Bilateral rhonchi with expiratory wheezing bilaterally.   Abdominal:  Soft, non-distended, non tender. Bowel sounds normoactive.   Musculoskeletal: Normal range of motion. She exhibits edema (2+).  Lymphadenopathy:    She has no cervical adenopathy.  Neurological: She is alert and oriented to person, place, and time. No cranial nerve deficit.  Anxious, tearful  Skin: Capillary refill takes less than 2 seconds. There is erythema (of neck and upper chest).  Hyperpigmentation of bilateral lower legs with scattered bruising    Disposition:    Allergies as of 06/02/2018      Reactions   Erythromycin Nausea And Vomiting, Rash   STOMACH PAIN   Levofloxacin    Other reaction(s): Other MUSCLE ACHES   Lidocaine Rash   Sulfa Antibiotics Rash      Medication List    STOP taking these medications   albuterol 108 (90 Base) MCG/ACT inhaler Commonly known as:  PROVENTIL HFA;VENTOLIN HFA   BECONASE AQ 42 MCG/SPRAY nasal spray  Generic drug:  beclomethasone   bisoprolol 5 MG tablet Commonly known as:  ZEBETA   bumetanide 1 MG tablet Commonly known as:  BUMEX   cyclobenzaprine 10 MG tablet Commonly known as:  FLEXERIL   ENTRESTO  49-51 MG Generic drug:  sacubitril-valsartan   Eszopiclone 3 MG Tabs   ipratropium 17 MCG/ACT inhaler Commonly known as:  ATROVENT HFA   lisinopril 20 MG tablet Commonly known as:  PRINIVIL,ZESTRIL   montelukast 10 MG tablet Commonly known as:  SINGULAIR   morphine 15 MG tablet Commonly known as:  MSIR   spironolactone 25 MG tablet Commonly known as:  ALDACTONE   SYNTHROID 200 MCG tablet Generic drug:  levothyroxine   zafirlukast 20 MG tablet Commonly known as:  ACCOLATE      Arlind Klingerman, DO  Signed: Mizael Sagar 06/05/2018, 9:15 AM

## 2018-06-06 ENCOUNTER — Telehealth: Payer: Self-pay

## 2018-06-06 DIAGNOSIS — I272 Pulmonary hypertension, unspecified: Secondary | ICD-10-CM

## 2018-06-06 MED ORDER — OXYCODONE HCL 5 MG PO TABS
5.00 | ORAL_TABLET | ORAL | Status: DC
Start: ? — End: 2018-06-06

## 2018-06-06 MED ORDER — CHOLECALCIFEROL 25 MCG (1000 UT) PO TABS
2000.00 | ORAL_TABLET | ORAL | Status: DC
Start: 2018-06-07 — End: 2018-06-06

## 2018-06-06 MED ORDER — SPIRONOLACTONE 25 MG PO TABS
25.00 | ORAL_TABLET | ORAL | Status: DC
Start: 2018-06-07 — End: 2018-06-06

## 2018-06-06 MED ORDER — TRAMADOL HCL 50 MG PO TABS
50.00 | ORAL_TABLET | ORAL | Status: DC
Start: ? — End: 2018-06-06

## 2018-06-06 MED ORDER — GENERIC EXTERNAL MEDICATION
Status: DC
Start: ? — End: 2018-06-06

## 2018-06-06 MED ORDER — ENOXAPARIN SODIUM 40 MG/0.4ML ~~LOC~~ SOLN
40.00 | SUBCUTANEOUS | Status: DC
Start: 2018-06-07 — End: 2018-06-06

## 2018-06-06 MED ORDER — VANCOMYCIN HCL 25 MG/ML PO SOLR
125.00 | ORAL | Status: DC
Start: 2018-06-06 — End: 2018-06-06

## 2018-06-06 NOTE — Progress Notes (Signed)
Discharge Progress Report  Patient Details  Name: Regina White MRN: [id number] Date of Birth: 08-Dec-1955 Referring Provider:     Pulmonary Rehab from 05/01/2018 in [facility]  Referring Provider  Freeman Caldron MD       Number of Visits: 8/36  Reason for Discharge:  Early Exit:  health issues  Smoking History:  Social History   Tobacco Use  Smoking Status Former Smoker  . Packs/day: 1.00  . Years: 30.00  . Pack years: 30.00  . Types: Cigarettes  . Last attempt to quit: 06/09/1999  . Years since quitting: 19.0  Smokeless Tobacco Never Used    Diagnosis:  Pulmonary hypertension (HCC)  ADL [facility]: Pulmonary Assessment Scores    Row Name 05/01/18 1527         ADL [facility]   ADL Phase  Entry     SOB Score total  39     Rest  1     Walk  1     Stairs  2     Bath  0     Dress  1     Shop  0       CAT Score   CAT Score  28       mMRC Score   mMRC Score  1        Initial Exercise Prescription: Initial Exercise Prescription - 05/01/18 1600      Date of Initial Exercise RX and Referring Provider   Date  05/01/18    Referring Provider  Freeman Caldron MD      Oxygen   Oxygen  Continuous    Liters  1      Arm Ergometer   Level  1    RPM  25    Minutes  15    METs  1.3      T5 Nustep   Level  1    SPM  80    Minutes  15    METs  1.3      Biostep-RELP   Level  1    SPM  50    Minutes  15    METs  2      Prescription Details   Frequency (times per week)  3    Duration  Progress to 45 minutes of aerobic exercise without signs/symptoms of physical distress      Intensity   THRR 40-80% of Max Heartrate  110-141    Ratings of Perceived Exertion  11-13    Perceived Dyspnea  0-4      Progression   Progression  Continue to progress workloads to maintain intensity without signs/symptoms of physical distress.      Resistance Training   Training Prescription  Yes    Weight  3 lbs    Reps  10-15       Discharge  Exercise Prescription (Final Exercise Prescription Changes): Exercise Prescription Changes - 05/23/18 1500      Response to Exercise   Blood Pressure (Admit)  120/80    Blood Pressure (Exit)  114/72    Heart Rate (Admit)  88 bpm    Heart Rate (Exercise)  93 bpm    Heart Rate (Exit)  84 bpm    Oxygen Saturation (Admit)  83 %    Oxygen Saturation (Exercise)  91 %    Oxygen Saturation (Exit)  97 %    Rating of Perceived Exertion (Exercise)  15    Perceived Dyspnea (Exercise)  0    Symptoms  fatigue    Duration  Progress to 45 minutes of aerobic exercise without signs/symptoms of physical distress    Intensity  THRR unchanged      Progression   Progression  Continue to progress workloads to maintain intensity without signs/symptoms of physical distress.    Average METs  1.7      Resistance Training   Training Prescription  Yes    Weight  2 lbs    Reps  10-15      Interval Training   Interval Training  No      Oxygen   Oxygen  Continuous    Liters  2      NuStep   Level  1    Minutes  15    METs  1.9      Arm Ergometer   Level  2    Minutes  15    METs  1.4      T5 Nustep   Level  1    Minutes  15    METs  1.8       Functional Capacity: 6 Minute Walk    Row Name 05/01/18 1617         6 Minute Walk   Phase  Initial NuStep Test     Distance  419 feet steps     Walk Time  6 minutes     # of Rest Breaks  0     METS  1.3     RPE  13     Perceived Dyspnea   2     Symptoms  No     Resting HR  78 bpm     Resting BP  126/64     Resting Oxygen Saturation   91 % initial resting was 84%      Exercise Oxygen Saturation  during 6 min walk  83 %     Max Ex. HR  99 bpm     Max Ex. BP  138/70     2 Minute Post BP  130/68       Interval HR   1 Minute HR  77     2 Minute HR  86     3 Minute HR  83     4 Minute HR  90     5 Minute HR  99     6 Minute HR  96     2 Minute Post HR  77     Interval Heart Rate?  Yes       Interval Oxygen   Interval Oxygen?  Yes   (Significant)  Sats after ambulating to and from scale was 71%, recovered to 91% after 3 min     Baseline Oxygen Saturation %  91 %     1 Minute Oxygen Saturation %  92 %     1 Minute Liters of Oxygen  0 L Room Air     2 Minute Oxygen Saturation %  94 %     2 Minute Liters of Oxygen  0 L     3 Minute Oxygen Saturation %  89 %     3 Minute Liters of Oxygen  0 L     4 Minute Oxygen Saturation %  84 %     4 Minute Liters of Oxygen  0 L     5 Minute Oxygen Saturation %  86 %     5 Minute Liters of Oxygen  0 L     6 Minute Oxygen Saturation %  83 %     6 Minute Liters of Oxygen  0 L     2 Minute Post Oxygen Saturation %  93 %     2 Minute Post Liters of Oxygen  0 L        Psychological, QOL, Others - Outcomes: PHQ 2/9: Depression screen PHQ 2/9 05/01/2018  Decreased Interest 2  Down, Depressed, Hopeless 1  PHQ - 2 Score 3  Altered sleeping 3  Tired, decreased energy 1  Change in appetite 0  Feeling bad or failure about yourself  1  Trouble concentrating 0  Moving slowly or fidgety/restless 0  Suicidal thoughts 0  PHQ-9 Score 8  Difficult doing work/chores Very difficult    Quality of Life:   Personal Goals: Goals established at orientation with interventions provided to work toward goal. Personal Goals and Risk Factors at Admission - 05/01/18 1530      Core Components/Risk Factors/Patient Goals on Admission    Weight Management  Yes;Obesity;Weight Loss    Intervention  Weight Management: Develop a combined nutrition and exercise program designed to reach desired caloric intake, while maintaining appropriate intake of nutrient and fiber, sodium and fats, and appropriate energy expenditure required for the weight goal.;Weight Management: Provide education and appropriate resources to help participant work on and attain dietary goals.;Weight Management/Obesity: Establish reasonable short term and long term weight goals.;Obesity: Provide education and appropriate resources to help  participant work on and attain dietary goals.    Admit Weight  232 lb (105.2 kg)    Goal Weight: Short Term  227 lb (103 kg)    Goal Weight: Long Term  180 lb (81.6 kg)    Expected Outcomes  Short Term: Continue to assess and modify interventions until short term weight is achieved;Long Term: Adherence to nutrition and physical activity/exercise program aimed toward attainment of established weight goal;Weight Maintenance: Understanding of the daily nutrition guidelines, which includes 25-35% calories from fat, 7% or less cal from saturated fats, less than 200mg  cholesterol, less than 1.5gm of sodium, & 5 or more servings of fruits and vegetables daily;Weight Loss: Understanding of general recommendations for a balanced deficit meal plan, which promotes 1-2 lb weight loss per week and includes a negative energy balance of [phone] kcal/d;Understanding recommendations for meals to include 15-35% energy as protein, 25-35% energy from fat, 35-60% energy from carbohydrates, less than 200mg  of dietary cholesterol, 20-35 gm of total fiber daily;Understanding of distribution of calorie intake throughout the day with the consumption of 4-5 meals/snacks    Improve shortness of breath with ADL's  Yes    Intervention  Provide education, individualized exercise plan and daily activity instruction to help decrease symptoms of SOB with activities of daily living.    Expected Outcomes  Short Term: Improve cardiorespiratory fitness to achieve a reduction of symptoms when performing ADLs;Long Term: Be able to perform more ADLs without symptoms or delay the onset of symptoms    Heart Failure  Yes    Intervention  Provide a combined exercise and nutrition program that is supplemented with education, support and counseling about heart failure. Directed toward relieving symptoms such as shortness of breath, decreased exercise tolerance, and extremity edema.    Expected Outcomes  Improve functional capacity of life;Short term:  Attendance in program 2-3 days a week with increased exercise capacity. Reported lower sodium intake. Reported increased fruit and vegetable intake. Reports medication compliance.;Short term: Daily weights obtained and  reported for increase. Utilizing diuretic protocols set by physician.;Long term: Adoption of self-care skills and reduction of barriers for early signs and symptoms recognition and intervention leading to self-care maintenance.    Hypertension  Yes    Intervention  Provide education on lifestyle modifcations including regular physical activity/exercise, weight management, moderate sodium restriction and increased consumption of fresh fruit, vegetables, and low fat dairy, alcohol moderation, and smoking cessation.;Monitor prescription use compliance.    Expected Outcomes  Short Term: Continued assessment and intervention until BP is < 140/59mm HG in hypertensive participants. < 130/39mm HG in hypertensive participants with diabetes, heart failure or chronic kidney disease.;Long Term: Maintenance of blood pressure at goal levels.        Personal Goals Discharge: Goals and Risk Factor Review    Row Name 05/15/18 1351             Core Components/Risk Factors/Patient Goals Review   Personal Goals Review  Weight Management/Obesity;Heart Failure;Hypertension;Improve shortness of breath with ADL's       Review  Shanay has been doing well in [facility] so far. She wants to lose weight and be able to do things at home easier. Her weight has been hard on her knees and limiting her mobility.       Expected Outcomes  Short: attened [facility] regularly to lose weight. Long: keep weight off to be more mobile at home.          Exercise Goals and Review: Exercise Goals    Row Name 05/01/18 1632             Exercise Goals   Increase Physical Activity  Yes       Intervention  Provide advice, education, support and counseling about physical activity/exercise needs.;Develop an  individualized exercise prescription for aerobic and resistive training based on initial evaluation findings, risk stratification, comorbidities and participant's personal goals.       Expected Outcomes  Short Term: Attend rehab on a regular basis to increase amount of physical activity.;Long Term: Add in home exercise to make exercise part of routine and to increase amount of physical activity.;Long Term: Exercising regularly at least 3-5 days a week.       Increase Strength and Stamina  Yes       Intervention  Provide advice, education, support and counseling about physical activity/exercise needs.;Develop an individualized exercise prescription for aerobic and resistive training based on initial evaluation findings, risk stratification, comorbidities and participant's personal goals.       Expected Outcomes  Short Term: Increase workloads from initial exercise prescription for resistance, speed, and METs.;Short Term: Perform resistance training exercises routinely during rehab and add in resistance training at home;Long Term: Improve cardiorespiratory fitness, muscular endurance and strength as measured by increased METs and functional capacity ( )       Able to understand and use rate of perceived exertion (RPE) scale  Yes       Intervention  Provide education and explanation on how to use RPE scale       Expected Outcomes  Short Term: Able to use RPE daily in rehab to express subjective intensity level;Long Term:  Able to use RPE to guide intensity level when exercising independently       Able to understand and use Dyspnea scale  Yes       Intervention  Provide education and explanation on how to use Dyspnea scale       Expected Outcomes  Short Term: Able to use Dyspnea scale daily  in rehab to express subjective sense of shortness of breath during exertion;Long Term: Able to use Dyspnea scale to guide intensity level when exercising independently       Knowledge and understanding of Target Heart  Rate Range (THRR)  Yes       Intervention  Provide education and explanation of THRR including how the numbers were predicted and where they are located for reference       Expected Outcomes  Short Term: Able to state/look up THRR;Short Term: Able to use daily as guideline for intensity in rehab;Long Term: Able to use THRR to govern intensity when exercising independently       Able to check pulse independently  Yes       Intervention  Provide education and demonstration on how to check pulse in carotid and radial arteries.;Review the importance of being able to check your own pulse for safety during independent exercise       Expected Outcomes  Short Term: Able to explain why pulse checking is important during independent exercise;Long Term: Able to check pulse independently and accurately       Understanding of Exercise Prescription  Yes       Intervention  Provide education, explanation, and written materials on patient's individual exercise prescription       Expected Outcomes  Short Term: Able to explain program exercise prescription;Long Term: Able to explain home exercise prescription to exercise independently          Nutrition & Weight - Outcomes: Pre Biometrics - 05/01/18 1632      Pre Biometrics   Height  5' 0.1" (1.527 m)    Weight  232 lb (105.2 kg)    Waist Circumference  45 inches    Hip Circumference  54 inches    Waist to Hip Ratio  0.83 %    BMI (Calculated)  45.13        Nutrition: Nutrition Therapy & Goals - 05/01/18 1525      Personal Nutrition Goals   Nutrition Goal  Lose weight    Personal Goal #2  She wants to learn more of what she is eating    Comments  She would like to meet with the dietician to learn how to eat better and lose weight.      Intervention Plan   Intervention  Prescribe, educate and counsel regarding individualized specific dietary modifications aiming towards targeted core components such as weight, hypertension, lipid management,  diabetes, heart failure and other comorbidities.    Expected Outcomes  Short Term Goal: Understand basic principles of dietary content, such as calories, fat, sodium, cholesterol and nutrients.;Long Term Goal: Adherence to prescribed nutrition plan.       Nutrition Discharge: Nutrition Assessments - 05/01/18 1525      MEDFICTS Scores   Pre Score  19       Education Questionnaire Score: Knowledge Questionnaire Score - 05/01/18 1528      Knowledge Questionnaire Score   Pre Score  15/18 reviewed with patient       Goals reviewed with patient; copy given to patient.

## 2018-06-06 NOTE — Telephone Encounter (Signed)
Left a message for patient after [facility]. Informed patient that she would be discharged and to call back with any questions. Post discharge planning states that she requires physical therapy. Discharge letter sent.

## 2018-06-06 NOTE — Progress Notes (Signed)
Pulmonary Individual Treatment Plan  Patient Details  White: Regina White MRN: [id number] Date of Birth: 05/14/1956 Referring Provider:     Pulmonary Rehab from 05/01/2018 in [facility] Cardiac and Pulmonary Rehab  Referring Provider  Regina Stalls MD      Initial Encounter Date:    Pulmonary Rehab from 05/01/2018 in [facility] Cardiac and Pulmonary Rehab  Date  05/01/18      Visit Diagnosis: Pulmonary hypertension ([place])  Patient's Home Medications on Admission: No current outpatient medications on file.  Past Medical History: Past Medical History:  Diagnosis Date  . Asthma   . CHF (congestive heart failure) ([place])   . Chronic back pain   . Pulmonary hypertension ([facility])     Tobacco Use: Social History   Tobacco Use  Smoking Status Former Smoker  . Packs/day: 1.00  . Years: 30.00  . Pack years: 30.00  . Types: Cigarettes  . Last attempt to quit: 06/09/1999  . Years since quitting: 19.0  Smokeless Tobacco Never Used    Labs: Recent Review Scientist, physiological    Labs for ITP Cardiac and Pulmonary Rehab Latest Ref Rng & Units 06/02/2018   HCO3 20.0 - 28.0 mmol/L 12.0(L)   ACIDBASEDEF 0.0 - 2.0 mmol/L 16.5(H)   O2SAT % 75.4       Pulmonary Assessment Scores: Pulmonary Assessment Scores    [name] 05/01/18 1527         ADL UCSD   ADL Phase  Entry     SOB Score total  39     Rest  1     Walk  1     Stairs  2     Bath  0     Dress  1     Shop  0       CAT Score   CAT Score  28       mMRC Score   mMRC Score  1        Pulmonary Function Assessment: Pulmonary Function Assessment - 05/01/18 1528      Breath   Bilateral Breath Sounds  Clear;Decreased    Shortness of Breath  Yes;Limiting activity       Exercise Target Goals:    Exercise Program Goal: Individual exercise prescription set using results from initial 6 min walk test and THRR while considering  patient's activity barriers and safety.    Exercise Prescription Goal: Initial exercise  prescription builds to 30-45 minutes a day of aerobic activity, 2-3 days per week.  Home exercise guidelines will be given to patient during program as part of exercise prescription that the participant will acknowledge.  Activity Barriers & Risk Stratification: Activity Barriers & Cardiac Risk Stratification - 05/01/18 1628      Activity Barriers & Cardiac Risk Stratification   Activity Barriers  Arthritis;Back Problems;Deconditioning;Muscular Weakness;Shortness of Breath;Balance Concerns;Joint Problems arth in bilateral hips, knees, and ankles       6 Minute Walk: 6 Minute Walk    [name] 05/01/18 1617         6 Minute Walk   Phase  Initial NuStep Test     Distance  419 feet steps     Walk Time  6 minutes     # of Rest Breaks  0     METS  1.3     RPE  13     Perceived Dyspnea   2     Symptoms  No     Resting HR  78 bpm  Resting BP  126/64     Resting Oxygen Saturation   91 % initial resting was 84%      Exercise Oxygen Saturation  during 6 min walk  83 %     Max Ex. HR  99 bpm     Max Ex. BP  138/70     2 Minute Post BP  130/68       Interval HR   1 Minute HR  77     2 Minute HR  86     3 Minute HR  83     4 Minute HR  90     5 Minute HR  99     6 Minute HR  96     2 Minute Post HR  77     Interval Heart Rate?  Yes       Interval Oxygen   Interval Oxygen?  Yes  (Significant)  Sats after ambulating to and from scale was 71%, recovered to 91% after 3 min     Baseline Oxygen Saturation %  91 %     1 Minute Oxygen Saturation %  92 %     1 Minute Liters of Oxygen  0 L Room Air     2 Minute Oxygen Saturation %  94 %     2 Minute Liters of Oxygen  0 L     3 Minute Oxygen Saturation %  89 %     3 Minute Liters of Oxygen  0 L     4 Minute Oxygen Saturation %  84 %     4 Minute Liters of Oxygen  0 L     5 Minute Oxygen Saturation %  86 %     5 Minute Liters of Oxygen  0 L     6 Minute Oxygen Saturation %  83 %     6 Minute Liters of Oxygen  0 L     2 Minute Post  Oxygen Saturation %  93 %     2 Minute Post Liters of Oxygen  0 L       Oxygen Initial Assessment: Oxygen Initial Assessment - 05/01/18 1529      Home Oxygen   Home Oxygen Device  None    Sleep Oxygen Prescription  None    Home Exercise Oxygen Prescription  None    Home at Rest Exercise Oxygen Prescription  None      Initial 6 min Walk   Oxygen Used  None      Program Oxygen Prescription   Program Oxygen Prescription  None      Intervention   Short Term Goals  To learn and understand importance of monitoring SPO2 with pulse oximeter and demonstrate accurate use of the pulse oximeter.;To learn and understand importance of maintaining oxygen saturations>88%;To learn and demonstrate proper pursed lip breathing techniques or other breathing techniques.;To learn and demonstrate proper use of respiratory medications    Long  Term Goals  Demonstrates proper use of MDI's;Compliance with respiratory medication;Exhibits proper breathing techniques, such as pursed lip breathing or other method taught during program session;Maintenance of O2 saturations>88%;Verbalizes importance of monitoring SPO2 with pulse oximeter and return demonstration       Oxygen Re-Evaluation: Oxygen Re-Evaluation    [name] 05/08/18 1507 05/15/18 1357           Program Oxygen Prescription   Program Oxygen Prescription  None  Continuous      Liters per minute  -  1      Comments  -  1-2 Liters prn        Home Oxygen   Home Oxygen Device  None  None      Sleep Oxygen Prescription  None  None      Home Exercise Oxygen Prescription  None  None      Home at Rest Exercise Oxygen Prescription  None  None        Goals/Expected Outcomes   Short Term Goals  To learn and understand importance of monitoring SPO2 with pulse oximeter and demonstrate accurate use of the pulse oximeter.;To learn and understand importance of maintaining oxygen saturations>88%;To learn and demonstrate proper pursed lip breathing techniques  or other breathing techniques.;To learn and demonstrate proper use of respiratory medications  To learn and understand importance of monitoring SPO2 with pulse oximeter and demonstrate accurate use of the pulse oximeter.;To learn and understand importance of maintaining oxygen saturations>88%;To learn and demonstrate proper pursed lip breathing techniques or other breathing techniques.;To learn and demonstrate proper use of respiratory medications      Long  Term Goals  Demonstrates proper use of MDI's;Compliance with respiratory medication;Exhibits proper breathing techniques, such as pursed lip breathing or other method taught during program session;Maintenance of O2 saturations>88%;Verbalizes importance of monitoring SPO2 with pulse oximeter and return demonstration  Demonstrates proper use of MDI's;Compliance with respiratory medication;Exhibits proper breathing techniques, such as pursed lip breathing or other method taught during program session;Maintenance of O2 saturations>88%;Verbalizes importance of monitoring SPO2 with pulse oximeter and return demonstration      Comments  Reviewed PLB technique with pt.  Talked about how it work and it's important to maintaining his exercise saturations.    Veatrice does not want to have oxygen at home but may need it. We are working with her in [place] to gauge her oxygen on exertion. She states she is 92 percent at home while resting. Informed her that she should take her oxygen when she is moving around to make sure her oxygen is not dropping below 88 percent. She is taking her nebulizers and inhaler as prescribed at home.      Goals/Expected Outcomes  Short: Become more profiecient at using PLB.   Long: Become independent at using PLB.  Short: monitor oxygen at home with exertion. Long: maintain oxygen saturations above 88 percent independently.         Oxygen Discharge (Final Oxygen Re-Evaluation): Oxygen Re-Evaluation - 05/15/18 1357      Program  Oxygen Prescription   Program Oxygen Prescription  Continuous    Liters per minute  1    Comments  1-2 Liters prn      Home Oxygen   Home Oxygen Device  None    Sleep Oxygen Prescription  None    Home Exercise Oxygen Prescription  None    Home at Rest Exercise Oxygen Prescription  None      Goals/Expected Outcomes   Short Term Goals  To learn and understand importance of monitoring SPO2 with pulse oximeter and demonstrate accurate use of the pulse oximeter.;To learn and understand importance of maintaining oxygen saturations>88%;To learn and demonstrate proper pursed lip breathing techniques or other breathing techniques.;To learn and demonstrate proper use of respiratory medications    Long  Term Goals  Demonstrates proper use of MDI's;Compliance with respiratory medication;Exhibits proper breathing techniques, such as pursed lip breathing or other method taught during program session;Maintenance of O2 saturations>88%;Verbalizes importance of monitoring SPO2 with pulse oximeter and return demonstration  Comments  Regina White does not want to have oxygen at home but may need it. We are working with her in [place] to gauge her oxygen on exertion. She states she is 92 percent at home while resting. Informed her that she should take her oxygen when she is moving around to make sure her oxygen is not dropping below 88 percent. She is taking her nebulizers and inhaler as prescribed at home.    Goals/Expected Outcomes  Short: monitor oxygen at home with exertion. Long: maintain oxygen saturations above 88 percent independently.       Initial Exercise Prescription: Initial Exercise Prescription - 05/01/18 1600      Date of Initial Exercise RX and Referring Provider   Date  05/01/18    Referring Provider  Regina Stalls MD      Oxygen   Oxygen  Continuous    Liters  1      Arm Ergometer   Level  1    RPM  25    Minutes  15    METs  1.3      T5 Nustep   Level  1    SPM  80     Minutes  15    METs  1.3      Biostep-RELP   Level  1    SPM  50    Minutes  15    METs  2      Prescription Details   Frequency (times per week)  3    Duration  Progress to 45 minutes of aerobic exercise without signs/symptoms of physical distress      Intensity   THRR 40-80% of Max Heartrate  110-141    Ratings of Perceived Exertion  11-13    Perceived Dyspnea  0-4      Progression   Progression  Continue to progress workloads to maintain intensity without signs/symptoms of physical distress.      Resistance Training   Training Prescription  Yes    Weight  3 lbs    Reps  10-15       Perform Capillary Blood Glucose checks as needed.  Exercise Prescription Changes: Exercise Prescription Changes    [name] 05/01/18 1600 05/09/18 1300 05/23/18 1500         Response to Exercise   Blood Pressure (Admit)  126/64  118/68  120/80     Blood Pressure (Exercise)  138/70  106/68  -     Blood Pressure (Exit)  130/68  120/56  114/72     Heart Rate (Admit)  78 bpm  77 bpm  88 bpm     Heart Rate (Exercise)  99 bpm  82 bpm  93 bpm     Heart Rate (Exit)  77 bpm  90 bpm  84 bpm     Oxygen Saturation (Admit)  91 %  92 %  83 %     Oxygen Saturation (Exercise)  83 %  89 %  91 %     Oxygen Saturation (Exit)  93 %  97 %  97 %     Rating of Perceived Exertion (Exercise)  13  13  15      Perceived Dyspnea (Exercise)  2  0  0     Symptoms  none  fatigue  fatigue     Comments  NuStep test results  first full day of exercise  -     Duration  -  Progress to 45 minutes of aerobic exercise  without signs/symptoms of physical distress  Progress to 45 minutes of aerobic exercise without signs/symptoms of physical distress     Intensity  -  THRR unchanged  THRR unchanged       Progression   Progression  -  Continue to progress workloads to maintain intensity without signs/symptoms of physical distress.  Continue to progress workloads to maintain intensity without signs/symptoms of physical  distress.     Average METs  -  1.4  1.7       Resistance Training   Training Prescription  -  Yes  Yes     Weight  -  2 lbs  2 lbs     Reps  -  10-15  10-15       Interval Training   Interval Training  -  -  No       Oxygen   Oxygen  -  Continuous  Continuous     Liters  -  4 PRN staff error  2       NuStep   Level  -  -  1     Minutes  -  -  15     METs  -  -  1.9       Arm Ergometer   Level  -  1  2     RPM  -  15  -     Minutes  -  15  15     METs  -  1.2  1.4       T5 Nustep   Level  -  1  1     SPM  -  77  -     Minutes  -  15  15     METs  -  1.3  1.8       Biostep-RELP   Level  -  1  -     SPM  -  83  -     Minutes  -  15  -        Exercise Comments: Exercise Comments    [name] 05/08/18 1507           Exercise Comments   First full day of exercise!  Patient was oriented to gym and equipment including functions, settings, policies, and procedures.  Patient's individual exercise prescription and treatment plan were reviewed.  All starting workloads were established based on the results of the 6 minute walk test done at initial orientation visit.  The plan for exercise progression was also introduced and progression will be customized based on patient's performance and goals.          Exercise Goals and Review: Exercise Goals    [name] 05/01/18 1632             Exercise Goals   Increase Physical Activity  Yes       Intervention  Provide advice, education, support and counseling about physical activity/exercise needs.;Develop an individualized exercise prescription for aerobic and resistive training based on initial evaluation findings, risk stratification, comorbidities and participant's personal goals.       Expected Outcomes  Short Term: Attend rehab on a regular basis to increase amount of physical activity.;Long Term: Add in home exercise to make exercise part of routine and to increase amount of physical activity.;Long Term: Exercising  regularly at least 3-5 days a week.       Increase Strength and Stamina  Yes  Intervention  Provide advice, education, support and counseling about physical activity/exercise needs.;Develop an individualized exercise prescription for aerobic and resistive training based on initial evaluation findings, risk stratification, comorbidities and participant's personal goals.       Expected Outcomes  Short Term: Increase workloads from initial exercise prescription for resistance, speed, and METs.;Short Term: Perform resistance training exercises routinely during rehab and add in resistance training at home;Long Term: Improve cardiorespiratory fitness, muscular endurance and strength as measured by increased METs and functional capacity (6MWT)       Able to understand and use rate of perceived exertion (RPE) scale  Yes       Intervention  Provide education and explanation on how to use RPE scale       Expected Outcomes  Short Term: Able to use RPE daily in rehab to express subjective intensity level;Long Term:  Able to use RPE to guide intensity level when exercising independently       Able to understand and use Dyspnea scale  Yes       Intervention  Provide education and explanation on how to use Dyspnea scale       Expected Outcomes  Short Term: Able to use Dyspnea scale daily in rehab to express subjective sense of shortness of breath during exertion;Long Term: Able to use Dyspnea scale to guide intensity level when exercising independently       Knowledge and understanding of Target Heart Rate Range (THRR)  Yes       Intervention  Provide education and explanation of THRR including how the numbers were predicted and where they are located for reference       Expected Outcomes  Short Term: Able to state/look up THRR;Short Term: Able to use daily as guideline for intensity in rehab;Long Term: Able to use THRR to govern intensity when exercising independently       Able to check pulse independently  Yes        Intervention  Provide education and demonstration on how to check pulse in carotid and radial arteries.;Review the importance of being able to check your own pulse for safety during independent exercise       Expected Outcomes  Short Term: Able to explain why pulse checking is important during independent exercise;Long Term: Able to check pulse independently and accurately       Understanding of Exercise Prescription  Yes       Intervention  Provide education, explanation, and written materials on patient's individual exercise prescription       Expected Outcomes  Short Term: Able to explain program exercise prescription;Long Term: Able to explain home exercise prescription to exercise independently          Exercise Goals Re-Evaluation : Exercise Goals Re-Evaluation    [name] 05/08/18 1507 05/23/18 1513           Exercise Goal Re-Evaluation   Exercise Goals Review  Understanding of Exercise Prescription;Able to understand and use Dyspnea scale;Knowledge and understanding of Target Heart Rate Range (THRR);Able to understand and use rate of perceived exertion (RPE) scale  Increase Physical Activity;Understanding of Exercise Prescription;Increase Strength and Stamina      Comments  Reviewed RPE scale, THR and program prescription with pt today.  Pt voiced understanding and was given a copy of goals to take home.   Regina White has been doing well in rehab.  She refuses to walk do to back pain and is thus on all seated equipment.  She has moved up to  level 2 on the arm crank.  We will continue to monitor her progress.      Expected Outcomes  Short: Use RPE daily to regulate intensity.  Long: Follow program prescription in THR.  Short: Increase workloads on steppers.  Long: Continue to follow program prescription.          Discharge Exercise Prescription (Final Exercise Prescription Changes): Exercise Prescription Changes - 05/23/18 1500      Response to Exercise   Blood Pressure (Admit)   120/80    Blood Pressure (Exit)  114/72    Heart Rate (Admit)  88 bpm    Heart Rate (Exercise)  93 bpm    Heart Rate (Exit)  84 bpm    Oxygen Saturation (Admit)  83 %    Oxygen Saturation (Exercise)  91 %    Oxygen Saturation (Exit)  97 %    Rating of Perceived Exertion (Exercise)  15    Perceived Dyspnea (Exercise)  0    Symptoms  fatigue    Duration  Progress to 45 minutes of aerobic exercise without signs/symptoms of physical distress    Intensity  THRR unchanged      Progression   Progression  Continue to progress workloads to maintain intensity without signs/symptoms of physical distress.    Average METs  1.7      Resistance Training   Training Prescription  Yes    Weight  2 lbs    Reps  10-15      Interval Training   Interval Training  No      Oxygen   Oxygen  Continuous    Liters  2      NuStep   Level  1    Minutes  15    METs  1.9      Arm Ergometer   Level  2    Minutes  15    METs  1.4      T5 Nustep   Level  1    Minutes  15    METs  1.8       Nutrition:  Target Goals: Understanding of nutrition guidelines, daily intake of sodium <1585m, cholesterol <2064m calories 30% from fat and 7% or less from saturated fats, daily to have 5 or more servings of fruits and vegetables.  Biometrics: Pre Biometrics - 05/01/18 1632      Pre Biometrics   Height  5' 0.1" (1.527 m)    Weight  232 lb (105.2 kg)    Waist Circumference  45 inches    Hip Circumference  54 inches    Waist to Hip Ratio  0.83 %    BMI (Calculated)  45.13        Nutrition Therapy Plan and Nutrition Goals: Nutrition Therapy & Goals - 05/01/18 1525      Personal Nutrition Goals   Nutrition Goal  Lose weight    Personal Goal #2  She wants to learn more of what she is eating    Comments  She would like to meet with the dietician to learn how to eat better and lose weight.      Intervention Plan   Intervention  Prescribe, educate and counsel regarding individualized specific  dietary modifications aiming towards targeted core components such as weight, hypertension, lipid management, diabetes, heart failure and other comorbidities.    Expected Outcomes  Short Term Goal: Understand basic principles of dietary content, such as calories, fat, sodium, cholesterol and nutrients.;Long Term Goal: Adherence to prescribed  nutrition plan.       Nutrition Assessments: Nutrition Assessments - 05/01/18 1525      MEDFICTS Scores   Pre Score  19       Nutrition Goals Re-Evaluation: Nutrition Goals Re-Evaluation    Regina White 05/15/18 1403             Goals   Current Weight  231 lb (104.8 kg)       Nutrition Goal  Lose weight, eat healthier and meet with the dietician.       Comment  Regina White wants to lose weight but was apprehensive to meet witht the dietician. Informed her that it is important to meet with the dietician and have a balanced diet when having breathing difficulty. Informed Regina White to schedule an appointment for her.       Expected Outcome  Short: Meet with the dietician. Long: Adhere to a diet plan.          Nutrition Goals Discharge (Final Nutrition Goals Re-Evaluation): Nutrition Goals Re-Evaluation - 05/15/18 1403      Goals   Current Weight  231 lb (104.8 kg)    Nutrition Goal  Lose weight, eat healthier and meet with the dietician.    Comment  Masa wants to lose weight but was apprehensive to meet witht the dietician. Informed her that it is important to meet with the dietician and have a balanced diet when having breathing difficulty. Informed Regina White to schedule an appointment for her.    Expected Outcome  Short: Meet with the dietician. Long: Adhere to a diet plan.       Psychosocial: Target Goals: Acknowledge presence or absence of significant depression and/or stress, maximize coping skills, provide positive support system. Participant is able to verbalize types and ability to use techniques and skills needed for reducing stress and  depression.   Initial Review & Psychosocial Screening: Initial Psych Review & Screening - 05/01/18 1520      Initial Review   Current issues with  Current Sleep Concerns;Current Stress Concerns    Source of Stress Concerns  Chronic Illness;Unable to perform yard/household activities;Unable to participate in former interests or hobbies    Comments  She is frustrated with not being able to move and walk well. She cant do household duties like she used to.      Family Dynamics   Good Support System?  Yes    Comments  she can look to her husband, dog and daughter for support.       Barriers   Psychosocial barriers to participate in program  The patient should benefit from training in stress management and relaxation.      Screening Interventions   Interventions  Encouraged to exercise;Program counselor consult;To provide support and resources with identified psychosocial needs;Provide feedback about the scores to participant    Expected Outcomes  Short Term goal: Utilizing psychosocial counselor, staff and physician to assist with identification of specific Stressors or current issues interfering with healing process. Setting desired goal for each stressor or current issue identified.;Long Term Goal: Stressors or current issues are controlled or eliminated.;Short Term goal: Identification and review with participant of any Quality of Life or Depression concerns found by scoring the questionnaire.;Long Term goal: The participant improves quality of Life and PHQ9 Scores as seen by post scores and/or verbalization of changes       Quality of Life Scores:  Scores of 19 and below usually indicate a poorer quality of life in these areas.  A  difference of  2-3 points is a clinically meaningful difference.  A difference of 2-3 points in the total score of the Quality of Life Index has been associated with significant improvement in overall quality of life, self-image, physical symptoms, and general  health in studies assessing change in quality of life.  PHQ-9: Recent Review Flowsheet Data    Depression screen [facility] 2/9 05/01/2018   Decreased Interest 2   Down, Depressed, Hopeless 1   PHQ - 2 Score 3   Altered sleeping 3   Tired, decreased energy 1   Change in appetite 0   Feeling bad or failure about yourself  1   Trouble concentrating 0   Moving slowly or fidgety/restless 0   Suicidal thoughts 0   PHQ-9 Score 8   Difficult doing work/chores Very difficult     Interpretation of Total Score  Total Score Depression Severity:  1-4 = Minimal depression, 5-9 = Mild depression, 10-14 = Moderate depression, 15-19 = Moderately severe depression, 20-27 = Severe depression   Psychosocial Evaluation and Intervention: Psychosocial Evaluation - 05/22/18 1237      Psychosocial Evaluation & Interventions   Interventions  Encouraged to exercise with the program and follow exercise prescription;Stress management education    Comments  Counselor met with Ms. Jaquess [facility])  today for initial psychosocial evaluation.  She is a 62 year old who has been diagnosed with pulmonary hypertension approx. 1 year ago.  Regina White has a strong support system with a spouse of 44 years and (3) adult children who live locally.  She has multiple health issues with over 14 back surgeries causing chronic pain and attends a pain clinic for relief.  As a result Regina White does not sleep well (pain related) with approximately 3 hours per night - although she is on medications for this.  Regina White has a decent appetite and denies a history of depression or anxiety or any current symptoms.  Her mood is generally stable per her report and her health is her primary stressor.  Regina White has goals to breathe better while in this program.  Staff will follow with her.     Expected Outcomes  Short:  Regina White will exercise consistently to help with pain and sleep issues.   Long:  Regina White will decrease her stress by use of positive coping strategies -  including exercise.      Continue Psychosocial Services   Follow up required by staff       Psychosocial Re-Evaluation: Psychosocial Re-Evaluation    Regina White White 05/15/18 1407             Psychosocial Re-Evaluation   Current issues with  Current Stress Concerns;Current Sleep Concerns       Comments  Most of Shantal's stress is from her not being able to do things that she used to do. She cannot move around very well and that makes her stressed out. When asked about her home life she was teary eyed and quiet about it. Informed her that the mental health conselor is on staff to talk to if she needs.        Expected Outcomes  Short: Attend [facility] to decrease stress. Long: Maintain exercise Post [facility] to keep stress at a minimum.       Interventions  Encouraged to attend Pulmonary Rehabilitation for the exercise       Continue Psychosocial Services   Follow up required by counselor          Psychosocial Discharge (Final Psychosocial Re-Evaluation):  Psychosocial Re-Evaluation - 05/15/18 1407      Psychosocial Re-Evaluation   Current issues with  Current Stress Concerns;Current Sleep Concerns    Comments  Most of Daleen's stress is from her not being able to do things that she used to do. She cannot move around very well and that makes her stressed out. When asked about her home life she was teary eyed and quiet about it. Informed her that the mental health conselor is on staff to talk to if she needs.     Expected Outcomes  Short: Attend [facility] to decrease stress. Long: Maintain exercise Post [facility] to keep stress at a minimum.    Interventions  Encouraged to attend Pulmonary Rehabilitation for the exercise    Continue Psychosocial Services   Follow up required by counselor       Education: Education Goals: Education classes will be provided on a weekly basis, covering required topics. Participant will state understanding/return demonstration of topics  presented.  Learning Barriers/Preferences: Learning Barriers/Preferences - 05/01/18 1528      Learning Barriers/Preferences   Learning Barriers  None    Learning Preferences  None       Education Topics:  Initial Evaluation Education: - Verbal, written and demonstration of respiratory meds, oximetry and breathing techniques. Instruction on use of nebulizers and MDIs and importance of monitoring MDI activations.   Pulmonary Rehab from 05/24/2018 in [facility] Cardiac and Pulmonary Rehab  Date  05/01/18  Educator  [facility]  Instruction Review Code  1- Verbalizes Understanding      General Nutrition Guidelines/Fats and Fiber: -Group instruction provided by verbal, written material, models and posters to present the general guidelines for heart healthy nutrition. Gives an explanation and review of dietary fats and fiber.   Pulmonary Rehab from 05/24/2018 in [facility] Cardiac and Pulmonary Rehab  Date  05/22/18  Educator  [facility]  Instruction Review Code  1- Verbalizes Understanding      Controlling Sodium/Reading Food Labels: -Group verbal and written material supporting the discussion of sodium use in heart healthy nutrition. Review and explanation with models, verbal and written materials for utilization of the food label.   Exercise Physiology & General Exercise Guidelines: - Group verbal and written instruction with models to review the exercise physiology of the cardiovascular system and associated critical values. Provides general exercise guidelines with specific guidelines to those with heart or lung disease.    Aerobic Exercise & Resistance Training: - Gives group verbal and written instruction on the various components of exercise. Focuses on aerobic and resistive training programs and the benefits of this training and how to safely progress through these programs.   Pulmonary Rehab from 05/24/2018 in [facility] Cardiac and Pulmonary Rehab  Date  05/24/18  Educator  [facility]  Instruction Review Code  1-  Verbalizes Understanding      Flexibility, Balance, Mind/Body Relaxation: Provides group verbal/written instruction on the benefits of flexibility and balance training, including mind/body exercise modes such as yoga, pilates and tai chi.  Demonstration and skill practice provided.   Stress and Anxiety: - Provides group verbal and written instruction about the health risks of elevated stress and causes of high stress.  Discuss the correlation between heart/lung disease and anxiety and treatment options. Review healthy ways to manage with stress and anxiety.   Depression: - Provides group verbal and written instruction on the correlation between heart/lung disease and depressed mood, treatment options, and the stigmas associated with seeking treatment.   Pulmonary Rehab from 05/24/2018 in [facility]  Cardiac and Pulmonary Rehab  Date  05/17/18  Educator  [facility]  Instruction Review Code  1- Verbalizes Understanding      Exercise & Equipment Safety: - Individual verbal instruction and demonstration of equipment use and safety with use of the equipment.   Pulmonary Rehab from 05/24/2018 in [facility] Cardiac and Pulmonary Rehab  Date  05/01/18  Educator  [facility]  Instruction Review Code  1- Verbalizes Understanding      Infection Prevention: - Provides verbal and written material to individual with discussion of infection control including proper hand washing and proper equipment cleaning during exercise session.   Pulmonary Rehab from 05/24/2018 in [facility] Cardiac and Pulmonary Rehab  Date  05/01/18  Educator  [facility]  Instruction Review Code  1- Verbalizes Understanding      Falls Prevention: - Provides verbal and written material to individual with discussion of falls prevention and safety.   Pulmonary Rehab from 05/24/2018 in [facility] Cardiac and Pulmonary Rehab  Date  05/01/18  Educator  [facility]  Instruction Review Code  1- Verbalizes Understanding      Diabetes: - Individual verbal and written instruction to  review signs/symptoms of diabetes, desired ranges of glucose level fasting, after meals and with exercise. Advice that pre and post exercise glucose checks will be done for 3 sessions at entry of program.   Chronic Lung Diseases: - Group verbal and written instruction to review updates, respiratory medications, advancements in procedures and treatments. Discuss use of supplemental oxygen including available portable oxygen systems, continuous and intermittent flow rates, concentrators, personal use and safety guidelines. Review proper use of inhaler and spacers. Provide informative websites for self-education.    Energy Conservation: - Provide group verbal and written instruction for methods to conserve energy, plan and organize activities. Instruct on pacing techniques, use of adaptive equipment and posture/positioning to relieve shortness of breath.   Triggers and Exacerbations: - Group verbal and written instruction to review types of environmental triggers and ways to prevent exacerbations. Discuss weather changes, air quality and the benefits of nasal washing. Review warning signs and symptoms to help prevent infections. Discuss techniques for effective airway clearance, coughing, and vibrations.   AED/CPR: - Group verbal and written instruction with the use of models to demonstrate the basic use of the AED with the basic ABC's of resuscitation.   Anatomy and Physiology of the Lungs: - Group verbal and written instruction with the use of models to provide basic lung anatomy and physiology related to function, structure and complications of lung disease.   Anatomy & Physiology of the Heart: - Group verbal and written instruction and models provide basic cardiac anatomy and physiology, with the coronary electrical and arterial systems. Review of Valvular disease and Heart Failure   Cardiac Medications: - Group verbal and written instruction to review commonly prescribed medications for  heart disease. Reviews the medication, class of the drug, and side effects.   Know Your Numbers and Risk Factors: -Group verbal and written instruction about important numbers in your health.  Discussion of what are risk factors and how they play a role in the disease process.  Review of Cholesterol, Blood Pressure, Diabetes, and BMI and the role they play in your overall health.   Pulmonary Rehab from 05/24/2018 in [facility] Cardiac and Pulmonary Rehab  Date  05/10/18  Educator  [facility]  Instruction Review Code  1- Verbalizes Understanding      Sleep Hygiene: -Provides group verbal and written instruction about how sleep can affect your health.  Define sleep hygiene, discuss sleep cycles and impact of sleep habits. Review good sleep hygiene tips.    Other: -Provides group and verbal instruction on various topics (see comments)    Knowledge Questionnaire Score: Knowledge Questionnaire Score - 05/01/18 1528      Knowledge Questionnaire Score   Pre Score  15/18 reviewed with patient        Core Components/Risk Factors/Patient Goals at Admission: Personal Goals and Risk Factors at Admission - 05/01/18 1530      Core Components/Risk Factors/Patient Goals on Admission    Weight Management  Yes;Obesity;Weight Loss    Intervention  Weight Management: Develop a combined nutrition and exercise program designed to reach desired caloric intake, while maintaining appropriate intake of nutrient and fiber, sodium and fats, and appropriate energy expenditure required for the weight goal.;Weight Management: Provide education and appropriate resources to help participant work on and attain dietary goals.;Weight Management/Obesity: Establish reasonable short term and long term weight goals.;Obesity: Provide education and appropriate resources to help participant work on and attain dietary goals.    Admit Weight  232 lb (105.2 kg)    Goal Weight: Short Term  227 lb (103 kg)    Goal Weight: Long Term  180 lb  (81.6 kg)    Expected Outcomes  Short Term: Continue to assess and modify interventions until short term weight is achieved;Long Term: Adherence to nutrition and physical activity/exercise program aimed toward attainment of established weight goal;Weight Maintenance: Understanding of the daily nutrition guidelines, which includes 25-35% calories from fat, 7% or less cal from saturated fats, less than 243m cholesterol, less than 1.5gm of sodium, & 5 or more servings of fruits and vegetables daily;Weight Loss: Understanding of general recommendations for a balanced deficit meal plan, which promotes 1-2 lb weight loss per week and includes a negative energy balance of [phone] kcal/d;Understanding recommendations for meals to include 15-35% energy as protein, 25-35% energy from fat, 35-60% energy from carbohydrates, less than 2017mof dietary cholesterol, 20-35 gm of total fiber daily;Understanding of distribution of calorie intake throughout the day with the consumption of 4-5 meals/snacks    Improve shortness of breath with ADL's  Yes    Intervention  Provide education, individualized exercise plan and daily activity instruction to help decrease symptoms of SOB with activities of daily living.    Expected Outcomes  Short Term: Improve cardiorespiratory fitness to achieve a reduction of symptoms when performing ADLs;Long Term: Be able to perform more ADLs without symptoms or delay the onset of symptoms    Heart Failure  Yes    Intervention  Provide a combined exercise and nutrition program that is supplemented with education, support and counseling about heart failure. Directed toward relieving symptoms such as shortness of breath, decreased exercise tolerance, and extremity edema.    Expected Outcomes  Improve functional capacity of life;Short term: Attendance in program 2-3 days a week with increased exercise capacity. Reported lower sodium intake. Reported increased fruit and vegetable intake. Reports  medication compliance.;Short term: Daily weights obtained and reported for increase. Utilizing diuretic protocols set by physician.;Long term: Adoption of self-care skills and reduction of barriers for early signs and symptoms recognition and intervention leading to self-care maintenance.    Hypertension  Yes    Intervention  Provide education on lifestyle modifcations including regular physical activity/exercise, weight management, moderate sodium restriction and increased consumption of fresh fruit, vegetables, and low fat dairy, alcohol moderation, and smoking cessation.;Monitor prescription use compliance.    Expected Outcomes  Short Term:  Continued assessment and intervention until BP is < 140/31m HG in hypertensive participants. < 130/850mHG in hypertensive participants with diabetes, heart failure or chronic kidney disease.;Long Term: Maintenance of blood pressure at goal levels.       Core Components/Risk Factors/Patient Goals Review:  Goals and Risk Factor Review    [name] 05/15/18 1351             Core Components/Risk Factors/Patient Goals Review   Personal Goals Review  Weight Management/Obesity;Heart Failure;Hypertension;Improve shortness of breath with ADL's       Review  CaBrienaas been doing well in [facility] so far. She wants to lose weight and be able to do things at home easier. Her weight has been hard on her knees and limiting her mobility.       Expected Outcomes  Short: attened [facility] regularly to lose weight. Long: keep weight off to be more mobile at home.          Core Components/Risk Factors/Patient Goals at Discharge (Final Review):  Goals and Risk Factor Review - 05/15/18 1351      Core Components/Risk Factors/Patient Goals Review   Personal Goals Review  Weight Management/Obesity;Heart Failure;Hypertension;Improve shortness of breath with ADL's    Review  CaElliotas been doing well in [facility] so far. She wants to lose weight and be able to do  things at home easier. Her weight has been hard on her knees and limiting her mobility.    Expected Outcomes  Short: attened [facility] regularly to lose weight. Long: keep weight off to be more mobile at home.       ITP Comments: ITP Comments    [name] 05/01/18 1536 05/08/18 1530 06/05/18 0907 06/06/18 1547 06/06/18 1549   ITP Comments  Medical Evaluation completed. Chart sent for review and changes to Dr. MaEmily Filbertirector of [place]osis [place] be found in [facility] encounter 07/13/16   30 day review completed. ITP sent to Dr. MaEmily Filbertirector of [place]Continue [place] unless changes are made by physician   30 day review completed. ITP sent to Dr. MaEmily Filbertirector of Lu[place]ak[place]Continue with [place] unless changes are made by physician  [facility] a message for patient [facility] would be [facility] and to call back with any questions. Post discharge planning states that she requires physical therapy. Discharge letter sent.  Discharge ITP sent and signed by Dr. MiSabra Heck Discharge Summary routed to PCP and pulmonologist.      Comments: Discharge ITP

## 2018-06-07 LAB — CULTURE, BLOOD (ROUTINE X 2)
Culture: NO GROWTH
Culture: NO GROWTH
Special Requests: ADEQUATE
Special Requests: ADEQUATE

## 2018-12-04 ENCOUNTER — Inpatient Hospital Stay
Admission: EM | Admit: 2018-12-04 | Discharge: 2018-12-05 | DRG: 189 | Disposition: A | Discharge status: Other Acute Inpatient Hospital | Payer: Medicare Other | Attending: Internal Medicine | Admitting: Internal Medicine

## 2018-12-04 ENCOUNTER — Emergency Department: Payer: Medicare Other

## 2018-12-04 ENCOUNTER — Other Ambulatory Visit: Payer: Self-pay

## 2018-12-04 DIAGNOSIS — N189 Chronic kidney disease, unspecified: Secondary | ICD-10-CM | POA: Diagnosis present

## 2018-12-04 DIAGNOSIS — J441 Chronic obstructive pulmonary disease with (acute) exacerbation: Secondary | ICD-10-CM | POA: Diagnosis present

## 2018-12-04 DIAGNOSIS — Z87891 Personal history of nicotine dependence: Secondary | ICD-10-CM

## 2018-12-04 DIAGNOSIS — E039 Hypothyroidism, unspecified: Secondary | ICD-10-CM | POA: Diagnosis present

## 2018-12-04 DIAGNOSIS — Z86718 Personal history of other venous thrombosis and embolism: Secondary | ICD-10-CM

## 2018-12-04 DIAGNOSIS — M549 Dorsalgia, unspecified: Secondary | ICD-10-CM | POA: Diagnosis present

## 2018-12-04 DIAGNOSIS — R197 Diarrhea, unspecified: Secondary | ICD-10-CM | POA: Diagnosis present

## 2018-12-04 DIAGNOSIS — I5023 Acute on chronic systolic (congestive) heart failure: Secondary | ICD-10-CM | POA: Diagnosis present

## 2018-12-04 DIAGNOSIS — J9621 Acute and chronic respiratory failure with hypoxia: Principal | ICD-10-CM | POA: Diagnosis present

## 2018-12-04 DIAGNOSIS — Z800 Family history of malignant neoplasm of digestive organs: Secondary | ICD-10-CM | POA: Diagnosis not present

## 2018-12-04 DIAGNOSIS — J9601 Acute respiratory failure with hypoxia: Secondary | ICD-10-CM | POA: Diagnosis present

## 2018-12-04 DIAGNOSIS — R570 Cardiogenic shock: Secondary | ICD-10-CM | POA: Diagnosis present

## 2018-12-04 DIAGNOSIS — I248 Other forms of acute ischemic heart disease: Secondary | ICD-10-CM | POA: Diagnosis present

## 2018-12-04 DIAGNOSIS — G8929 Other chronic pain: Secondary | ICD-10-CM | POA: Diagnosis present

## 2018-12-04 DIAGNOSIS — E872 Acidosis, unspecified: Secondary | ICD-10-CM

## 2018-12-04 DIAGNOSIS — G4733 Obstructive sleep apnea (adult) (pediatric): Secondary | ICD-10-CM | POA: Diagnosis present

## 2018-12-04 DIAGNOSIS — I130 Hypertensive heart and chronic kidney disease with heart failure and stage 1 through stage 4 chronic kidney disease, or unspecified chronic kidney disease: Secondary | ICD-10-CM | POA: Diagnosis present

## 2018-12-04 DIAGNOSIS — I272 Pulmonary hypertension, unspecified: Secondary | ICD-10-CM | POA: Diagnosis present

## 2018-12-04 DIAGNOSIS — N289 Disorder of kidney and ureter, unspecified: Secondary | ICD-10-CM

## 2018-12-04 DIAGNOSIS — Z8249 Family history of ischemic heart disease and other diseases of the circulatory system: Secondary | ICD-10-CM

## 2018-12-04 DIAGNOSIS — N179 Acute kidney failure, unspecified: Secondary | ICD-10-CM | POA: Diagnosis present

## 2018-12-04 LAB — CBC WITH DIFFERENTIAL/PLATELET
ABS IMMATURE GRANULOCYTES: 0.07 10*3/uL (ref 0.00–0.07)
BASOS ABS: 0.1 10*3/uL (ref 0.0–0.1)
BASOS PCT: 1 %
EOS ABS: 0 10*3/uL (ref 0.0–0.5)
Eosinophils Relative: 0 %
HCT: 40.9 % (ref 36.0–46.0)
Hemoglobin: 12.5 g/dL (ref 12.0–15.0)
IMMATURE GRANULOCYTES: 1 %
Lymphocytes Relative: 4 %
Lymphs Abs: 0.4 10*3/uL — ABNORMAL LOW (ref 0.7–4.0)
MCH: 27.3 pg (ref 26.0–34.0)
MCHC: 30.6 g/dL (ref 30.0–36.0)
MCV: 89.3 fL (ref 80.0–100.0)
MONOS PCT: 4 %
Monocytes Absolute: 0.4 10*3/uL (ref 0.1–1.0)
NEUTROS ABS: 10 10*3/uL — AB (ref 1.7–7.7)
NEUTROS PCT: 90 %
NRBC: 0.2 % (ref 0.0–0.2)
PLATELETS: 358 10*3/uL (ref 150–400)
RBC: 4.58 MIL/uL (ref 3.87–5.11)
RDW: 17.1 % — AB (ref 11.5–15.5)
WBC: 11 10*3/uL — ABNORMAL HIGH (ref 4.0–10.5)

## 2018-12-04 LAB — URINE DRUG SCREEN, QUALITATIVE (ARMC ONLY)
Amphetamines, Ur Screen: NOT DETECTED
Barbiturates, Ur Screen: NOT DETECTED
Benzodiazepine, Ur Scrn: NOT DETECTED
CANNABINOID 50 NG, UR ~~LOC~~: NOT DETECTED
Cocaine Metabolite,Ur ~~LOC~~: NOT DETECTED
MDMA (Ecstasy)Ur Screen: NOT DETECTED
Methadone Scn, Ur: NOT DETECTED
Opiate, Ur Screen: POSITIVE — AB
Phencyclidine (PCP) Ur S: NOT DETECTED
Tricyclic, Ur Screen: POSITIVE — AB

## 2018-12-04 LAB — BLOOD GAS, VENOUS
Acid-base deficit: 15.5 mmol/L — ABNORMAL HIGH (ref 0.0–2.0)
Bicarbonate: 13 mmol/L — ABNORMAL LOW (ref 20.0–28.0)
Delivery systems: POSITIVE
FIO2: 0.5
O2 Saturation: 28.1 %
Patient temperature: 37
pCO2, Ven: 39 mmHg — ABNORMAL LOW (ref 44.0–60.0)
pH, Ven: 7.13 — CL (ref 7.250–7.430)

## 2018-12-04 LAB — URINALYSIS, COMPLETE (UACMP) WITH MICROSCOPIC
BILIRUBIN URINE: NEGATIVE
GLUCOSE, UA: NEGATIVE mg/dL
KETONES UR: NEGATIVE mg/dL
NITRITE: NEGATIVE
PROTEIN: 100 mg/dL — AB
Specific Gravity, Urine: 1.021 (ref 1.005–1.030)
pH: 5 (ref 5.0–8.0)

## 2018-12-04 LAB — LACTIC ACID, PLASMA
LACTIC ACID, VENOUS: 2.1 mmol/L — AB (ref 0.5–1.9)
Lactic Acid, Venous: 6.8 mmol/L (ref 0.5–1.9)

## 2018-12-04 LAB — INFLUENZA PANEL BY PCR (TYPE A & B)
INFLAPCR: NEGATIVE
Influenza B By PCR: NEGATIVE

## 2018-12-04 LAB — BASIC METABOLIC PANEL
ANION GAP: 21 — AB (ref 5–15)
BUN: 100 mg/dL — ABNORMAL HIGH (ref 8–23)
CALCIUM: 7.4 mg/dL — AB (ref 8.9–10.3)
CO2: 13 mmol/L — ABNORMAL LOW (ref 22–32)
Chloride: 99 mmol/L (ref 98–111)
Creatinine, Ser: 5.93 mg/dL — ABNORMAL HIGH (ref 0.44–1.00)
GFR, EST AFRICAN AMERICAN: 8 mL/min — AB (ref >60)
GFR, EST NON AFRICAN AMERICAN: 7 mL/min — AB (ref >60)
GLUCOSE: 151 mg/dL — AB (ref 70–99)
POTASSIUM: 4.4 mmol/L (ref 3.5–5.1)
Sodium: 133 mmol/L — ABNORMAL LOW (ref 135–145)

## 2018-12-04 LAB — PROCALCITONIN: Procalcitonin: 59.05 ng/mL

## 2018-12-04 LAB — BRAIN NATRIURETIC PEPTIDE: B NATRIURETIC PEPTIDE 5: 1737 pg/mL — AB (ref 0.0–100.0)

## 2018-12-04 LAB — ETHANOL: Alcohol, Ethyl (B): <10 mg/dL (ref <10)

## 2018-12-04 LAB — TROPONIN I: Troponin I: 1.42 ng/mL (ref <0.03)

## 2018-12-04 MED ORDER — METHYLPREDNISOLONE SODIUM SUCC 125 MG IJ SOLR
125.0000 mg | Freq: Once | INTRAMUSCULAR | Status: AC
  Administered 2018-12-04: 125 mg via INTRAVENOUS
  Filled 2018-12-04: qty 2

## 2018-12-04 MED ORDER — ALBUTEROL SULFATE (2.5 MG/3ML) 0.083% IN NEBU: 2.5000 mg | INHALATION_SOLUTION | RESPIRATORY_TRACT | Status: DC | PRN

## 2018-12-04 MED ORDER — DOPAMINE-DEXTROSE 3.2-5 MG/ML-% IV SOLN: 0.0000 ug/kg/min | INTRAVENOUS | Status: DC

## 2018-12-04 MED ORDER — VANCOMYCIN HCL IN DEXTROSE 1-5 GM/200ML-% IV SOLN
1000.0000 mg | Freq: Once | INTRAVENOUS | Status: AC
  Administered 2018-12-04: 1000 mg via INTRAVENOUS
  Filled 2018-12-04: qty 200

## 2018-12-04 MED ORDER — IPRATROPIUM-ALBUTEROL 0.5-2.5 (3) MG/3ML IN SOLN
3.0000 mL | Freq: Once | RESPIRATORY_TRACT | Status: AC
  Administered 2018-12-04: 3 mL via RESPIRATORY_TRACT
  Filled 2018-12-04: qty 3

## 2018-12-04 MED ORDER — ONDANSETRON HCL 4 MG PO TABS: 4.0000 mg | ORAL_TABLET | Freq: Four times a day (QID) | ORAL | Status: DC | PRN

## 2018-12-04 MED ORDER — SODIUM CHLORIDE 0.9 % IV BOLUS
1000.0000 mL | Freq: Once | INTRAVENOUS | Status: AC
  Administered 2018-12-04: 1000 mL via INTRAVENOUS

## 2018-12-04 MED ORDER — VANCOMYCIN HCL IN DEXTROSE 1-5 GM/200ML-% IV SOLN: 1000.0000 mg | Freq: Once | INTRAVENOUS | Status: DC

## 2018-12-04 MED ORDER — SODIUM CHLORIDE 0.9 % IV BOLUS: 1000.0000 mL | Freq: Once | INTRAVENOUS | Status: DC

## 2018-12-04 MED ORDER — SODIUM CHLORIDE 0.9 % IV SOLN
2.0000 g | Freq: Once | INTRAVENOUS | Status: AC
  Administered 2018-12-04: 2 g via INTRAVENOUS
  Filled 2018-12-04: qty 2

## 2018-12-04 MED ORDER — HEPARIN SODIUM (PORCINE) 5000 UNIT/ML IJ SOLN: 5000.0000 [IU] | Freq: Three times a day (TID) | INTRAMUSCULAR | Status: DC

## 2018-12-04 MED ORDER — ACETAMINOPHEN 325 MG PO TABS: 650.0000 mg | ORAL_TABLET | Freq: Four times a day (QID) | ORAL | Status: DC | PRN

## 2018-12-04 MED ORDER — SODIUM CHLORIDE 0.9 % IV SOLN
Freq: Once | INTRAVENOUS | Status: AC
  Administered 2018-12-04: 19:00:00 via INTRAVENOUS

## 2018-12-04 MED ORDER — ONDANSETRON HCL 4 MG/2ML IJ SOLN: 4.0000 mg | Freq: Four times a day (QID) | INTRAMUSCULAR | Status: DC | PRN

## 2018-12-04 MED ORDER — DOPAMINE-DEXTROSE 3.2-5 MG/ML-% IV SOLN
0.0000 ug/kg/min | INTRAVENOUS | Status: DC
  Administered 2018-12-04: 5 ug/kg/min via INTRAVENOUS
  Filled 2018-12-04: qty 250

## 2018-12-04 MED ORDER — SODIUM CHLORIDE 0.9 % IV SOLN: Freq: Once | INTRAVENOUS | Status: DC

## 2018-12-04 MED ORDER — ACETAMINOPHEN 650 MG RE SUPP: 650.0000 mg | Freq: Four times a day (QID) | RECTAL | Status: DC | PRN

## 2018-12-04 NOTE — ED Provider Notes (Signed)
Spoke with Dr. Allena Katz from [facility] ICU who recommended transferring patient to [facility] as patient is very sick at this time and to ensure continuity of care.  Patient wishes to go back to [facility].  Since there are no ICU beds available until this evening he recommended ED to ED transfer.  I discussed patient with Dr. Dorie Rank, [facility] ED attending who has accepted patient ED to ED transfer.  Patient remains in critical condition but stable with improving lactic from 6-2.1.  Mental status remains at baseline.  Per recommendation of [facility] cardiology, patient was started on dopamine to avoid fluid overload.   CRITICAL CARE Performed by: Nita Sickle  ?  Total critical care time: 30 min  Critical care time was exclusive of separately billable procedures and treating other patients.  Critical care was necessary to treat or prevent imminent or life-threatening deterioration.  Critical care was time spent personally by me on the following activities: development of treatment plan with patient and/or surrogate as well as nursing, discussions with consultants, evaluation of patient's response to treatment, examination of patient, obtaining history from patient or surrogate, ordering and performing treatments and interventions, ordering and review of laboratory studies, ordering and review of radiographic studies, pulse oximetry and re-evaluation of patient's condition.    Don Perking, [place], MD 12/04/18 2155

## 2018-12-04 NOTE — ED Triage Notes (Signed)
Pt comes via [facility] from home after fall outside. Pt was found by EMS to have stridor in lungs, cyanotic nail beds and around the lips. Pt given 0.5 mg of narcan with no extra response. Pt placed on CPAP and given 2 albuterol.  Family reports pt had OD on morphine in past. Pt arrives alert and speaking to MD at this time.  Respiratory at bedside.  EMS reports 78 % on RA, 70 systolic.

## 2018-12-04 NOTE — ED Notes (Signed)
Date and time results received: 12/04/18 1517 (use smartphrase ".now" to insert current time)  Test: lactic Critical Value: 6.8  Name of Provider Notified: MD Siadecki  Orders Received? Or Actions Taken?: Orders Received - See Orders for details

## 2018-12-04 NOTE — H&P (Deleted)
[facility] [place] at [facility]   PATIENT NAME: Regina White    MR#:  [id number]  DATE OF BIRTH:  11-29-1955  DATE OF ADMISSION:  12/04/2018  PRIMARY CARE PHYSICIAN: Cain SieveKlipstein, Christopher, MD   REQUESTING/REFERRING PHYSICIAN: dr Marisa SeverinSiadecki  CHIEF COMPLAINT:  generalized weakness increasing shortness of breath and altered mental status  reopen from patient's husband and some history from patient  HISTORY OF PRESENT ILLNESS:  Regina SitesCathleen Klar  is a 63 y.o. female with a known history of chronic back pain has intrathecal morphine pump [place], obstructive sleep apnea, COPD, cardiomyopathy with EF of 35% by echo of May 2019, history of pulmonary hypertension comes to the emergency room after patient felt very weak and almost passed out. Patient was [facility] from November 18, 2018 [facility] with respiratory distress and was found to have respiratory panel positive for coronavirus. Patient went home her creatinine was .98.  According to the husband patient has been having severe diarrhea for last few days. Her PO intake has been poor. She is been having increasing shortness of breath and leg swelling as well.  Patient was found to be in acute respiratory distress currently on BiPAP sets are stable, she also has acute renal failure creatinine 5.93 and congestive heart failure acute on chronic systolic with possible cardiogenic shock. Her blood pressure stays in the 70s in the emergency room.  Husband tells me outpatient her blood pressure usually is on the lower side.  She received broad-spectrum antibiotic with cefepime vancomycin. She received a dose of IV Solu-Medrol. And she received some IV fluids as well  Patient is being admitted with acute on chronic respiratory failure secondary to COPD exacerbation, cardiogenic shock/cardio renal syndrome, poor PO intake, diarrhea, acute renal failure and severe hypotension with  lactic acidosis.  PAST MEDICAL HISTORY:   Past Medical History:  Diagnosis Date  . Asthma   . CHF (congestive heart failure) (HCC)   . Chronic back pain   . Pulmonary hypertension (HCC)     PAST SURGICAL HISTOIRY:   Past Surgical History:  Procedure Laterality Date  . BACK SURGERY      SOCIAL HISTORY:   Social History   Tobacco Use  . Smoking status: Former Smoker    Packs/day: 1.00    Years: 30.00    Pack years: 30.00    Types: Cigarettes    Last attempt to quit: 06/09/1999    Years since quitting: 19.5  . Smokeless tobacco: Never Used  Substance Use Topics  . Alcohol use: No    FAMILY HISTORY:   Family History  Problem Relation Age of Onset  . Hypertension Mother   . Liver cancer Father     DRUG ALLERGIES:   Allergies  Allergen Reactions  . Erythromycin Nausea And Vomiting and Rash    STOMACH PAIN  . Levofloxacin     Other reaction(s): Other MUSCLE ACHES  . Lidocaine Rash  . Sulfa Antibiotics Rash    REVIEW OF SYSTEMS:  Review of Systems  Constitutional: Negative for chills, fever and weight loss.  HENT: Negative for ear discharge, ear pain and nosebleeds.   Eyes: Negative for blurred vision, pain and discharge.  Respiratory: Positive for shortness of breath. Negative for sputum production, wheezing and stridor.   Cardiovascular: Negative for chest pain, palpitations, orthopnea and PND.  Gastrointestinal: Positive for diarrhea and nausea. Negative for abdominal pain and vomiting.  Genitourinary: Negative for frequency and urgency.  Musculoskeletal:  Positive for back pain. Negative for joint pain.  Neurological: Positive for weakness and headaches. Negative for sensory change, speech change and focal weakness.  Psychiatric/Behavioral: Negative for depression and hallucinations. The patient is not nervous/anxious.      MEDICATIONS AT HOME:   Prior to Admission medications   Not on File      VITAL SIGNS:  Blood pressure (!) 79/57, pulse 83,  temperature 97.9 F (36.6 C), temperature source Axillary, resp. rate 15, height 5\' 5"  (1.651 m), weight 102.1 kg, SpO2 95 %.  PHYSICAL EXAMINATION:  GENERAL:  63 y.o.-year-old patient lying in the bed with mild to moderate acute distress. He is chronically ill EYES: Pupils equal, round, reactive to light and accommodation. No scleral icterus. Extraocular muscles intact.  HEENT: Head atraumatic, normocephalic. Oropharynx and nasopharynx clear. Dry oral mucosa NECK:  Supple, no jugular venous distention. No thyroid enlargement, no tenderness.  LUNGS: coarse breath sounds bilaterally, no wheezing, rales,rhonchi or crepitation. No use of accessory muscles of respiration. BIPAP+ CARDIOVASCULAR: S1, S2 normal. No murmurs, rubs, or gallops.  ABDOMEN: Soft, nontender, nondistended. Bowel sounds present. No organomegaly or mass.  EXTREMITIES: ++ pedal edema,no cyanosis, or clubbin g.  NEUROLOGIC: moves all extremities well. Unable to assess detailed neuro- exams since patient is on BiPAP.  PSYCHIATRIC: The patient is alert but somewhat lethargic. Answers most questions appropriately. SKIN: No obvious rash, lesion, or ulcer.   LABORATORY PANEL:   CBC Recent Labs  Lab 12/04/18 1439  WBC 11.0*  HGB 12.5  HCT 40.9  PLT 358   ------------------------------------------------------------------------------------------------------------------  Chemistries  Recent Labs  Lab 12/04/18 1439  NA 133*  K 4.4  CL 99  CO2 13*  GLUCOSE 151*  BUN 100*  CREATININE 5.93*  CALCIUM 7.4*   ------------------------------------------------------------------------------------------------------------------  Cardiac Enzymes Recent Labs  Lab 12/04/18 1439  TROPONINI 1.42*   ------------------------------------------------------------------------------------------------------------------  RADIOLOGY:  Dg Chest Portable 1 View  Result Date: 12/04/2018 CLINICAL DATA:  Shortness of breath. EXAM: PORTABLE  CHEST 1 VIEW COMPARISON:  Chest x-ray and chest CT dated 06/02/2018 FINDINGS: There is chronic cardiomegaly with chronic enlargement of the pulmonary arteries. No discrete infiltrates or effusions. No acute bone abnormality. IMPRESSION: Chronic cardiomegaly with chronic marked enlargement of the pulmonary arteries. Electronically Signed   By: Francene BoyersJames  Maxwell M.D.   On: 12/04/2018 15:10    EKG:    IMPRESSION AND PLAN:   Regina White  is a 63 y.o. female with a known history of chronic back pain has intrathecal morphine pump [place], obstructive sleep apnea, COPD, cardiomyopathy with EF of 35% by echo of May 2019, history of pulmonary hypertension comes to the emergency room after patient felt very weak and almost passed out  1. Acute on chronic hypoxic respiratory failure secondary to cardiogenic shock/acute on chronic systolic congestive heart failure and COPD exacerbation -came in with shortness of breath hypoxemia elevated BNP -currently on BiPAP sats stable -received IV Solu-Medrol 125 mg times one in the ER, continue PRN nebs -wean off BiPAP abs able to  2. Acute renal failure appears cardio renal syndrome/poor PO intake from G.I. losses -patient received IV fluids in the ER -given elevated BNP I will avoid much IV fluids start patient on IV dopamine drip -nephrology consultation. Discussed with Dr. Thedore MinsSingh -? Dialysis  3. Ongoing diarrhea at home with history of C. Diff -stool studies C. diff and G.I. PCR pending  4. Acute on chronic congestive heart failure systolic EF of 35% -patient had cardiac  cath in May 2019 at [facility] showed not obstructive CAD -cardiology consultation with Dr. Lady Gary case discussed -recommends IV dopamine drip  5. Chronic back pain patient on intrathecal morphine pump -managed by Dr. Ether Griffins at [facility] pain clinic -according to notes from [facility] discharge summary patient's morphine was decreased by 10% given her hypotension,  lethargy and renal failure was admission from January 11 to November 24, 2018 -avoid oral narcotics  6. Severe lactic acidosis due to above  7. Elevated troponin appears demand ischemia in the setting of renal failure and CHF -continue to monitor troponin's -patient had recent heart cath at [facility] in May 2019-- showed not obstructive CAD EF of 35%  8. DVT prophylaxis subcu heparin  Patient is critically ill. This was discussed with patient's husband in the emergency room. They request transfer to [facility]. ER physician has spoken with ICU attending at [facility] and they feel she is appropriate for transfer to [facility]. ER physician is waiting for [facility] ER MD to call back to see if patient could be transferred.   All the records are reviewed and case discussed with ED provider.   CODE STATUS: FUll  TOTAL critical TIME TAKING CARE OF THIS PATIENT: *50* minutes.    Enedina Finner M.D on 12/04/2018 at 5:27 PM  Between 7am to 6pm - Pager - [phone]  After 6pm go to [email or web address] - password [email or web address] [facility]  [facility]  Office  [phone]  CC: Primary care physician; Cain Sieve, MD

## 2018-12-04 NOTE — ED Provider Notes (Signed)
[facility] Emergency Department Provider Note ____________________________________________   First MD Initiated Contact with Patient 12/04/18 1435     (approximate)  I have reviewed the triage vital signs and the nursing notes.   HISTORY  Chief Complaint Respiratory Distress  Level 5 caveat: History present illness limited due to respiratory distress  HPI Regina White is a 63 y.o. female with PMH as noted below including pulmonary hypertension, CHF, and COPD, as well as chronic back pain on a morphine pump who presents with respiratory distress after a fall.  Per the patient's son and husband, she has been feeling weak and unwell for the last week.  This afternoon while getting out of her car, she fell and then appeared to be short of breath and cyanotic.  EMS found her to be hypoxic to the 70s.  They gave Narcan with no response, however the patient did improve after being placed on CPAP.  The patient denies any acute pain at this time and does not remember what happened leading up to the fall.  Past Medical History:  Diagnosis Date  . Asthma   . CHF (congestive heart failure) (HCC)   . Chronic back pain   . Pulmonary hypertension [facility])     Patient Active Problem List   Diagnosis Date Noted  . Acute respiratory failure with hypoxia (HCC) 06/02/2018  . Diarrhea of presumed infectious origin 02/17/2016  . Right acute serous otitis media 02/17/2016  . COPD (chronic obstructive pulmonary disease) (HCC) 12/23/2015  . Heart failure with preserved ejection fraction (HCC) 12/23/2015  . DDD (degenerative disc disease) 09/25/2013  . Narrowing of intervertebral disc space 09/25/2013  . OSA (obstructive sleep apnea) 07/25/2013  . Deep vein thrombosis (DVT) (HCC) 06/25/2013  . Erythema 06/18/2013  . Left leg pain 06/18/2013  . Mediastinal lymphadenopathy 06/18/2013  . DVT, lower extremity (HCC) 06/14/2013  . Pulmonary hypertension (HCC) 06/14/2013  .  Asthma 09/21/2010  . Chronic pain 09/21/2010  . Hypertension, benign 09/21/2010  . Hypothyroidism 09/21/2010  . Venous stasis 09/21/2010    Past Surgical History:  Procedure Laterality Date  . BACK SURGERY      Prior to Admission medications   Not on File    Allergies Erythromycin; Levofloxacin; Lidocaine; and Sulfa antibiotics  Family History  Problem Relation Age of Onset  . Hypertension Mother   . Liver cancer Father     Social History Social History   Tobacco Use  . Smoking status: Former Smoker    Packs/day: 1.00    Years: 30.00    Pack years: 30.00    Types: Cigarettes    Last attempt to quit: 06/09/1999    Years since quitting: 19.5  . Smokeless tobacco: Never Used  Substance Use Topics  . Alcohol use: No  . Drug use: Not on file    Review of Systems Level 5 caveat: Unable to obtain review of systems due to respiratory distress   ____________________________________________   PHYSICAL EXAM:  VITAL SIGNS: ED Triage Vitals  Enc Vitals Group     BP 12/04/18 1430 124/90     Pulse Rate 12/04/18 1433 90     Resp 12/04/18 1433 (!) 28     Temp --      Temp src --      SpO2 12/04/18 1433 94 %     Weight 12/04/18 1431 225 lb (102.1 kg)     Height 12/04/18 1431 5\' 5"  (1.651 m)     Head Circumference --  Peak Flow --      Pain Score 12/04/18 1431 10     Pain Loc --      Pain Edu? --      Excl. in GC? --     Constitutional: Alert, weak and uncomfortable appearing. Eyes: Conjunctivae are normal.  EOMI.  PERRLA. Head: Atraumatic. Nose: No congestion/rhinnorhea. Mouth/Throat: Mucous membranes are somewhat dry.   Neck: Normal range of motion.  Cardiovascular: Normal rate, regular rhythm. Grossly normal heart sounds.  Good peripheral circulation. Respiratory: Increased respiratory effort and tachypnea.  Decreased breath sounds bilaterally with faint wheezing.. Gastrointestinal: Soft and nontender. No distention.  Genitourinary: No flank  tenderness. Musculoskeletal: Extremities warm and well perfused.  Neurologic:  Normal speech and language. No gross focal neurologic deficits are appreciated.  Skin:  Skin is warm and dry. No rash noted. Psychiatric: Mood and affect are normal. Speech and behavior are normal.  ____________________________________________   LABS (all labs ordered are listed, but only abnormal results are displayed)  Labs Reviewed  BASIC METABOLIC PANEL - Abnormal; Notable for the following components:      Result Value   Sodium 133 (*)    CO2 13 (*)    Glucose, Bld 151 (*)    BUN 100 (*)    Creatinine, Ser 5.93 (*)    Calcium 7.4 (*)    GFR calc non Af Amer 7 (*)    GFR calc Af Amer 8 (*)    Anion gap 21 (*)    All other components within normal limits  CBC WITH DIFFERENTIAL/PLATELET - Abnormal; Notable for the following components:   WBC 11.0 (*)    RDW 17.1 (*)    Neutro Abs 10.0 (*)    Lymphs Abs 0.4 (*)    All other components within normal limits  TROPONIN I - Abnormal; Notable for the following components:   Troponin I 1.42 (*)    All other components within normal limits  LACTIC ACID, PLASMA - Abnormal; Notable for the following components:   Lactic Acid, Venous 6.8 (*)    All other components within normal limits  BRAIN NATRIURETIC PEPTIDE - Abnormal; Notable for the following components:   B Natriuretic Peptide 1,737.0 (*)    All other components within normal limits  BLOOD GAS, VENOUS - Abnormal; Notable for the following components:   pH, Ven 7.13 (*)    pCO2, Ven 39 (*)    pO2, Ven <31.0 (*)    Bicarbonate 13.0 (*)    Acid-base deficit 15.5 (*)    All other components within normal limits  ETHANOL  INFLUENZA PANEL BY PCR (TYPE A & B)  LACTIC ACID, PLASMA  URINALYSIS, COMPLETE (UACMP) WITH MICROSCOPIC  URINE DRUG SCREEN, QUALITATIVE ([facility] ONLY)   ____________________________________________  EKG  ED ECG REPORT I, Dionne Bucy, the attending physician,  personally viewed and interpreted this ECG.  Date: 12/04/2018 EKG Time: 1434 Rate: 89 Rhythm: normal sinus rhythm QRS Axis: normal Intervals: RBBB ST/T Wave abnormalities: Diffuse T wave inversions Narrative Interpretation: Nonspecific ST abnormalities, with no significant change when compared to EKG of 06/02/2018  ED ECG REPORT I, Dionne Bucy, the attending physician, personally viewed and interpreted this ECG.  Date: 12/04/2018 EKG Time: 1556 Rate: 83 Rhythm: normal sinus rhythm QRS Axis: normal Intervals: RBBB ST/T Wave abnormalities: Diffuse T wave inversions and some ST depression Narrative Interpretation: No significant change when compared to EKG of 1434 today   ____________________________________________  RADIOLOGY  CXR: Cardiomegaly, unchanged from prior.  No focal  infiltrate  ____________________________________________   PROCEDURES  Procedure(s) performed: No  Procedures  Critical Care performed: Yes  CRITICAL CARE Performed by: Dionne BucySebastian Daylin Gruszka   Total critical care time: 45 minutes  Critical care time was exclusive of separately billable procedures and treating other patients.  Critical care was necessary to treat or prevent imminent or life-threatening deterioration.  Critical care was time spent personally by me on the following activities: development of treatment plan with patient and/or surrogate as well as nursing, discussions with consultants, evaluation of patient's response to treatment, examination of patient, obtaining history from patient or surrogate, ordering and performing treatments and interventions, ordering and review of laboratory studies, ordering and review of radiographic studies, pulse oximetry and re-evaluation of patient's condition.  ____________________________________________   INITIAL IMPRESSION / ASSESSMENT AND PLAN / ED COURSE  Pertinent labs & imaging results that were available during my care of the  patient were reviewed by me and considered in my medical decision making (see chart for details).  63 year old female with PMH as noted above presents with acute onset of shortness of breath as well as feeling weak and unwell over the last week.  There was concern for possible overdose on her morphine pump for her chronic back pain, but she did not have any response to Narcan.  Her breathing and mental status improved with CPAP in the field.  I reviewed the past medical records in Epic and care everywhere.  The patient was actually [facility] this [facility] with a similar presentation, was noted to be hypotensive and in respiratory distress with respiratory acidosis, requiring ICU stay.  She had AKI which improved with fluid resuscitation.  Her pain regimen was thought to be a contributing factor and her doses were modified.  On arrival today, the patient was placed immediately on BiPAP.  She was alert and able to answer questions in short sentences.  She was noted to have tachypnea but her other vital signs were normal including normal BP.  She had decreased breath sounds and wheezing bilaterally.  The remainder of the exam is as described above.  Differential includes pneumonia, influenza or other viral etiology, other infection, COPD exacerbation, CHF exacerbation, symptoms related to her pulmonary fibrosis, or medication related especially with the morphine.  We will obtain chest x-ray, lab work-up, albuterol nebulizer treatments, continue on BiPAP, and reassess.  ----------------------------------------- 3:58 PM on 12/04/2018 -----------------------------------------  Patient's blood pressure dropped, similar to what it had been when she was most recently admitted.  I started a fluid bolus.  Chest x-ray shows no obvious focal infiltrate.  She has acidosis on her VBG similar to her prior presentation, and an elevated lactate and troponin as well as recurrent AKI compared to a creatinine  of 1.59 when she left the hospital 4 days ago.  I suspect that the troponin is likely related to demand ischemia.  The patient has no chest pain and no EKG changes.  She has no CAD history that I am able to see, so we will hold off on heparin for now.  The patient's family requested [facility] since they were happy with her care there during her recent admission.  I have contacted the transfer center to initiate this process although if there are no beds available, we will admit here.  ----------------------------------------- 4:36 PM on 12/04/2018 -----------------------------------------  I discussed the case with the [facility].  At this time [facility].  We are still waiting to speak to the physician there.  However I discussed this with the patient's husband and he agrees that the patient can be admitted here in the meantime and then [facility].  I think this will be the safest approach for the patient so that she can get ICU care and not have a prolonged stay in the emergency department.  At this time, the patient's blood pressure remains somewhat low but stable.  She is doing well with fluids.  Empiric antibiotics are running.  I signed the patient out to the hospitalist Dr. Allena KatzPatel at approximately 4:35 PM. ____________________________________________   FINAL CLINICAL IMPRESSION(S) / ED DIAGNOSES  Final diagnoses:  Acute respiratory failure with hypoxia (HCC)  Acute renal insufficiency  Lactic acidosis      NEW MEDICATIONS STARTED DURING THIS VISIT:  New Prescriptions   No medications on file     Note:  This document was prepared using Dragon voice recognition software and may include unintentional dictation errors.    Dionne BucySiadecki, Cerenity Goshorn, MD 12/04/18 [phone]

## 2018-12-04 NOTE — ED Notes (Addendum)
Date and time results received: 12/04/18 1530 (use smartphrase ".now" to insert current time)  Test: troponin Critical Value: 1.42  Name of Provider Notified: MD Siadecki  Orders Received? Or Actions Taken?: Orders Received - See Orders for details

## 2018-12-04 NOTE — Consult Note (Signed)
[facility] - [place] at [facility]   PATIENT NAME: Regina White    MR#:  [id number]  DATE OF BIRTH:  [birth year]  DATE OF ADMISSION:  12/04/2018  PRIMARY CARE PHYSICIAN: Cain Sieve, MD   REQUESTING/REFERRING PHYSICIAN: dr Marisa Severin  CHIEF COMPLAINT:  generalized weakness increasing shortness of breath and altered mental status  reopen from patient's husband and some history from patient  HISTORY OF PRESENT ILLNESS:  Regina White  is a 63 y.o. female with a known history of chronic back pain has intrathecal morphine pump followed by pain clinic in [place], obstructive sleep apnea, COPD, cardiomyopathy with EF of 35% by echo of May 2019, history of pulmonary hypertension comes to the emergency room after patient felt very weak and almost passed out. Patient was admitted from November 18, 2018 January 17 at [facility] with respiratory distress and was found to have respiratory panel positive for coronavirus. Patient went home her creatinine was .98.  According to the husband patient has been having severe diarrhea for last few days. Her PO intake has been poor. She is been having increasing shortness of breath and leg swelling as well.  Patient was found to be in acute respiratory distress currently on BiPAP sets are stable, she also has acute renal failure creatinine 5.93 and congestive heart failure acute on chronic systolic with possible cardiogenic shock. Her blood pressure stays in the 70s in the emergency room.  Husband tells me outpatient her blood pressure usually is on the lower side.  She received broad-spectrum antibiotic with cefepime vancomycin. She received a dose of IV Solu-Medrol. And she received some IV fluids as well  Patient is being admitted with acute on chronic respiratory failure secondary to COPD exacerbation, cardiogenic shock/cardio renal syndrome, poor PO intake, diarrhea, acute renal failure and severe  hypotension with lactic acidosis.  PAST MEDICAL HISTORY:       Past Medical History:  Diagnosis Date  . Asthma   . CHF (congestive heart failure) (HCC)   . Chronic back pain   . Pulmonary hypertension (HCC)     PAST SURGICAL HISTOIRY:        Past Surgical History:  Procedure Laterality Date  . BACK SURGERY      SOCIAL HISTORY:   Social History        Tobacco Use  . Smoking status: Former Smoker    Packs/day: 1.00    Years: 30.00    Pack years: 30.00    Types: Cigarettes    Last attempt to quit: 06/09/1999    Years since quitting: 19.5  . Smokeless tobacco: Never Used  Substance Use Topics  . Alcohol use: No    FAMILY HISTORY:        Family History  Problem Relation Age of Onset  . Hypertension Mother   . Liver cancer Father     DRUG ALLERGIES:        Allergies  Allergen Reactions  . Erythromycin Nausea And Vomiting and Rash    STOMACH PAIN  . Levofloxacin     Other reaction(s): Other MUSCLE ACHES  . Lidocaine Rash  . Sulfa Antibiotics Rash    REVIEW OF SYSTEMS:  Review of Systems  Constitutional: Negative for chills, fever and weight loss.  HENT: Negative for ear discharge, ear pain and nosebleeds.   Eyes: Negative for blurred vision, pain and discharge.  Respiratory: Positive for shortness of breath. Negative for sputum production, wheezing and stridor.   Cardiovascular: Negative for chest pain, palpitations, orthopnea  and PND.  Gastrointestinal: Positive for diarrhea and nausea. Negative for abdominal pain and vomiting.  Genitourinary: Negative for frequency and urgency.  Musculoskeletal: Positive for back pain. Negative for joint pain.  Neurological: Positive for weakness and headaches. Negative for sensory change, speech change and focal weakness.  Psychiatric/Behavioral: Negative for depression and hallucinations. The patient is not nervous/anxious.      MEDICATIONS AT HOME:   Prior to  Admission medications   Not on File      VITAL SIGNS:  Blood pressure (!) 79/57, pulse 83, temperature 97.9 F (36.6 C), temperature source Axillary, resp. rate 15, height 5\' 5"  (1.651 m), weight 102.1 kg, SpO2 95 %.  PHYSICAL EXAMINATION:  GENERAL:  63 y.o.-year-old patient lying in the bed with mild to moderate acute distress. He is chronically ill EYES: Pupils equal, round, reactive to light and accommodation. No scleral icterus. Extraocular muscles intact.  HEENT: Head atraumatic, normocephalic. Oropharynx and nasopharynx clear. Dry oral mucosa NECK:  Supple, no jugular venous distention. No thyroid enlargement, no tenderness.  LUNGS: coarse breath sounds bilaterally, no wheezing, rales,rhonchi or crepitation. No use of accessory muscles of respiration. BIPAP+ CARDIOVASCULAR: S1, S2 normal. No murmurs, rubs, or gallops.  ABDOMEN: Soft, nontender, nondistended. Bowel sounds present. No organomegaly or mass.  EXTREMITIES: ++ pedal edema,no cyanosis, or clubbin g.  NEUROLOGIC: moves all extremities well. Unable to assess detailed neuro- exams since patient is on BiPAP.  PSYCHIATRIC: The patient is alert but somewhat lethargic. Answers most questions appropriately. SKIN: No obvious rash, lesion, or ulcer.   LABORATORY PANEL:   CBC LastLabs     Recent Labs  Lab 12/04/18 1439  WBC 11.0*  HGB 12.5  HCT 40.9  PLT 358     ------------------------------------------------------------------------------------------------------------------  Chemistries  LastLabs     Recent Labs  Lab 12/04/18 1439  NA 133*  K 4.4  CL 99  CO2 13*  GLUCOSE 151*  BUN 100*  CREATININE 5.93*  CALCIUM 7.4*     ------------------------------------------------------------------------------------------------------------------  Cardiac Enzymes LastLabs     Recent Labs  Lab 12/04/18 1439  TROPONINI 1.42*      ------------------------------------------------------------------------------------------------------------------  RADIOLOGY:   ImagingResults(Last48hours)  Dg Chest Portable 1 View  Result Date: 12/04/2018 CLINICAL DATA:  Shortness of breath. EXAM: PORTABLE CHEST 1 VIEW COMPARISON:  Chest x-ray and chest CT dated 06/02/2018 FINDINGS: There is chronic cardiomegaly with chronic enlargement of the pulmonary arteries. No discrete infiltrates or effusions. No acute bone abnormality. IMPRESSION: Chronic cardiomegaly with chronic marked enlargement of the pulmonary arteries. Electronically Signed   By: Francene Boyers M.D.   On: 12/04/2018 15:10     EKG:    IMPRESSION AND PLAN:   Regina White  is a 63 y.o. female with a known history of chronic back pain has intrathecal morphine pump followed by pain clinic in [place], obstructive sleep apnea, COPD, cardiomyopathy with EF of 35% by echo of May 2019, history of pulmonary hypertension comes to the emergency room after patient felt very weak and almost passed out  1. Acute on chronic hypoxic respiratory failure secondary to cardiogenic shock/acute on chronic systolic congestive heart failure and COPD exacerbation -came in with shortness of breath hypoxemia elevated BNP -currently on BiPAP sats stable -received IV Solu-Medrol 125 mg times one in the ER, continue PRN nebs -wean off BiPAP abs able to  2. Acute renal failure appears cardio renal syndrome/poor PO intake from G.I. losses -patient received IV fluids in the ER -given elevated BNP I will avoid much IV  fluids start patient on IV dopamine drip -nephrology consultation. Discussed with Dr. Thedore MinsSingh -? Dialysis  3. Ongoing diarrhea at home with history of C. Diff -stool studies C. diff and G.I. PCR pending  4. Acute on chronic congestive heart failure systolic EF of 35% -[facility] showed not [facility] -cardiology  consultation with Dr. Lady GaryFath case discussed -recommends IV dopamine drip  5. Chronic back pain patient on intrathecal morphine pump -[facility] -[facility] patient's morphine was decreased by 10% given her hypotension, lethargy and renal failure was admission from January 11 to November 24, 2018 -avoid oral narcotics  6. Severe lactic acidosis due to above  7. Elevated troponin appears demand ischemia in the setting of renal failure and CHF -continue to monitor troponin's -patient [facility]-- showed not obstructive CAD EF of 35%  8. DVT prophylaxis subcu heparin  Patient is critically ill. This was discussed with patient's husband in the emergency room. [facility] ER physician [facility] and they feel she is appropriate [facility]. ER physician is [facility] for [facility] to call back to see if patient could be transferred.   All the records are reviewed and case discussed with ED provider.   CODE STATUS: FUll  TOTAL critical TIME TAKING CARE OF THIS PATIENT: *50* minutes.    Enedina FinnerSona Timia Casselman M.D on 12/04/2018 at 5:27 PM  Between 7am to 6pm - Pager - [phone]  After 6pm go to [email or web address] - password [email or web address] [facility]  [facility]  Office  [phone]  CC: Primary care physician; Cain SieveKlipstein, Christopher, MD           Routing History

## 2018-12-04 NOTE — ED Notes (Signed)
Repeat EKG completed

## 2018-12-04 NOTE — Progress Notes (Signed)
Patient ID: Regina White, female   DOB: 26-Jul-1956, 63 y.o.   MRN: [id number] patient and family was requesting transfer to [facility]. ER physician Dr. Don Perking spoke with the ER MD at [facility] where patient gets all her care and patient has been accepted at [facility]. Should be transferred. Should be started on IV dopamine drip. ICU attending at [facility] has been notified by ER physician Dr. Don Perking

## 2019-07-22 ENCOUNTER — Other Ambulatory Visit: Payer: Self-pay

## 2019-07-22 ENCOUNTER — Emergency Department: Payer: Medicare Other

## 2019-07-22 ENCOUNTER — Inpatient Hospital Stay
Admission: EM | Admit: 2019-07-22 | Discharge: 2019-08-02 | DRG: 871 | Disposition: A | Discharge status: Home Health | Payer: Medicare Other | Attending: Internal Medicine | Admitting: Internal Medicine

## 2019-07-22 ENCOUNTER — Inpatient Hospital Stay: Payer: Medicare Other

## 2019-07-22 ENCOUNTER — Encounter: Payer: Self-pay | Admitting: Emergency Medicine

## 2019-07-22 DIAGNOSIS — G4733 Obstructive sleep apnea (adult) (pediatric): Secondary | ICD-10-CM | POA: Diagnosis present

## 2019-07-22 DIAGNOSIS — Z86718 Personal history of other venous thrombosis and embolism: Secondary | ICD-10-CM

## 2019-07-22 DIAGNOSIS — Z23 Encounter for immunization: Secondary | ICD-10-CM | POA: Diagnosis not present

## 2019-07-22 DIAGNOSIS — I5042 Chronic combined systolic (congestive) and diastolic (congestive) heart failure: Secondary | ICD-10-CM | POA: Diagnosis present

## 2019-07-22 DIAGNOSIS — R571 Hypovolemic shock: Secondary | ICD-10-CM | POA: Diagnosis present

## 2019-07-22 DIAGNOSIS — J449 Chronic obstructive pulmonary disease, unspecified: Secondary | ICD-10-CM | POA: Diagnosis present

## 2019-07-22 DIAGNOSIS — Z87891 Personal history of nicotine dependence: Secondary | ICD-10-CM

## 2019-07-22 DIAGNOSIS — I110 Hypertensive heart disease with heart failure: Secondary | ICD-10-CM | POA: Diagnosis present

## 2019-07-22 DIAGNOSIS — Z79899 Other long term (current) drug therapy: Secondary | ICD-10-CM

## 2019-07-22 DIAGNOSIS — Z882 Allergy status to sulfonamides status: Secondary | ICD-10-CM

## 2019-07-22 DIAGNOSIS — I959 Hypotension, unspecified: Secondary | ICD-10-CM

## 2019-07-22 DIAGNOSIS — I739 Peripheral vascular disease, unspecified: Secondary | ICD-10-CM | POA: Diagnosis present

## 2019-07-22 DIAGNOSIS — A419 Sepsis, unspecified organism: Secondary | ICD-10-CM | POA: Diagnosis present

## 2019-07-22 DIAGNOSIS — E039 Hypothyroidism, unspecified: Secondary | ICD-10-CM | POA: Diagnosis present

## 2019-07-22 DIAGNOSIS — N390 Urinary tract infection, site not specified: Secondary | ICD-10-CM | POA: Diagnosis present

## 2019-07-22 DIAGNOSIS — Z20828 Contact with and (suspected) exposure to other viral communicable diseases: Secondary | ICD-10-CM | POA: Diagnosis present

## 2019-07-22 DIAGNOSIS — I472 Ventricular tachycardia: Secondary | ICD-10-CM | POA: Diagnosis not present

## 2019-07-22 DIAGNOSIS — G894 Chronic pain syndrome: Secondary | ICD-10-CM | POA: Diagnosis present

## 2019-07-22 DIAGNOSIS — J9611 Chronic respiratory failure with hypoxia: Secondary | ICD-10-CM | POA: Diagnosis present

## 2019-07-22 DIAGNOSIS — N179 Acute kidney failure, unspecified: Secondary | ICD-10-CM | POA: Diagnosis present

## 2019-07-22 DIAGNOSIS — M549 Dorsalgia, unspecified: Secondary | ICD-10-CM | POA: Diagnosis present

## 2019-07-22 DIAGNOSIS — Z79890 Hormone replacement therapy (postmenopausal): Secondary | ICD-10-CM | POA: Diagnosis not present

## 2019-07-22 DIAGNOSIS — Z7901 Long term (current) use of anticoagulants: Secondary | ICD-10-CM | POA: Diagnosis not present

## 2019-07-22 DIAGNOSIS — Z880 Allergy status to penicillin: Secondary | ICD-10-CM

## 2019-07-22 DIAGNOSIS — Z8249 Family history of ischemic heart disease and other diseases of the circulatory system: Secondary | ICD-10-CM

## 2019-07-22 DIAGNOSIS — Z888 Allergy status to other drugs, medicaments and biological substances status: Secondary | ICD-10-CM

## 2019-07-22 DIAGNOSIS — R6521 Severe sepsis with septic shock: Secondary | ICD-10-CM | POA: Diagnosis present

## 2019-07-22 HISTORY — DX: Chronic obstructive pulmonary disease, unspecified: J44.9

## 2019-07-22 LAB — CBC WITH DIFFERENTIAL/PLATELET
Abs Immature Granulocytes: 0.09 10*3/uL — ABNORMAL HIGH (ref 0.00–0.07)
Basophils Absolute: 0.1 10*3/uL (ref 0.0–0.1)
Basophils Relative: 1 %
Eosinophils Absolute: 0.1 10*3/uL (ref 0.0–0.5)
Eosinophils Relative: 2 %
HCT: 29.4 % — ABNORMAL LOW (ref 36.0–46.0)
Hemoglobin: 8.7 g/dL — ABNORMAL LOW (ref 12.0–15.0)
Immature Granulocytes: 1 %
Lymphocytes Relative: 17 %
Lymphs Abs: 1.3 10*3/uL (ref 0.7–4.0)
MCH: 28.4 pg (ref 26.0–34.0)
MCHC: 29.6 g/dL — ABNORMAL LOW (ref 30.0–36.0)
MCV: 96.1 fL (ref 80.0–100.0)
Monocytes Absolute: 0.5 10*3/uL (ref 0.1–1.0)
Monocytes Relative: 7 %
Neutro Abs: 5.3 10*3/uL (ref 1.7–7.7)
Neutrophils Relative %: 72 %
Platelets: 326 10*3/uL (ref 150–400)
RBC: 3.06 MIL/uL — ABNORMAL LOW (ref 3.87–5.11)
RDW: 18.4 % — ABNORMAL HIGH (ref 11.5–15.5)
WBC: 7.4 10*3/uL (ref 4.0–10.5)
nRBC: 0 % (ref 0.0–0.2)

## 2019-07-22 LAB — COMPREHENSIVE METABOLIC PANEL
ALT: 9 U/L (ref 0–44)
AST: 12 U/L — ABNORMAL LOW (ref 15–41)
Albumin: 2.8 g/dL — ABNORMAL LOW (ref 3.5–5.0)
Alkaline Phosphatase: 73 U/L (ref 38–126)
Anion gap: 11 (ref 5–15)
BUN: 65 mg/dL — ABNORMAL HIGH (ref 8–23)
CO2: 24 mmol/L (ref 22–32)
Calcium: 7.9 mg/dL — ABNORMAL LOW (ref 8.9–10.3)
Chloride: 100 mmol/L (ref 98–111)
Creatinine, Ser: 2.81 mg/dL — ABNORMAL HIGH (ref 0.44–1.00)
GFR calc Af Amer: 20 mL/min — ABNORMAL LOW (ref >60)
GFR calc non Af Amer: 17 mL/min — ABNORMAL LOW (ref >60)
Glucose, Bld: 120 mg/dL — ABNORMAL HIGH (ref 70–99)
Potassium: 4.6 mmol/L (ref 3.5–5.1)
Sodium: 135 mmol/L (ref 135–145)
Total Bilirubin: 0.8 mg/dL (ref 0.3–1.2)
Total Protein: 5.3 g/dL — ABNORMAL LOW (ref 6.5–8.1)

## 2019-07-22 LAB — URINALYSIS, COMPLETE (UACMP) WITH MICROSCOPIC
Bilirubin Urine: NEGATIVE
Glucose, UA: NEGATIVE mg/dL
Hgb urine dipstick: NEGATIVE
Ketones, ur: NEGATIVE mg/dL
Nitrite: NEGATIVE
Protein, ur: 30 mg/dL — AB
Specific Gravity, Urine: 1.016 (ref 1.005–1.030)
WBC, UA: >50 WBC/hpf — ABNORMAL HIGH (ref 0–5)
pH: 5 (ref 5.0–8.0)

## 2019-07-22 LAB — BRAIN NATRIURETIC PEPTIDE: B Natriuretic Peptide: 276 pg/mL — ABNORMAL HIGH (ref 0.0–100.0)

## 2019-07-22 LAB — SARS CORONAVIRUS 2 BY RT PCR (HOSPITAL ORDER, PERFORMED IN ~~LOC~~ HOSPITAL LAB): SARS Coronavirus 2: NEGATIVE

## 2019-07-22 LAB — TYPE AND SCREEN
ABO/RH(D): AB POS
Antibody Screen: NEGATIVE

## 2019-07-22 LAB — GLUCOSE, CAPILLARY
Glucose-Capillary: 80 mg/dL (ref 70–99)
Glucose-Capillary: 145 mg/dL — ABNORMAL HIGH (ref 70–99)

## 2019-07-22 LAB — LACTIC ACID, PLASMA: Lactic Acid, Venous: 1.4 mmol/L (ref 0.5–1.9)

## 2019-07-22 LAB — MRSA PCR SCREENING: MRSA by PCR: NEGATIVE

## 2019-07-22 MED ORDER — POLYETHYLENE GLYCOL 3350 17 G PO PACK: 17.0000 g | PACK | Freq: Every day | ORAL | Status: DC | PRN

## 2019-07-22 MED ORDER — ONDANSETRON HCL 4 MG PO TABS: 4.0000 mg | ORAL_TABLET | Freq: Four times a day (QID) | ORAL | Status: DC | PRN

## 2019-07-22 MED ORDER — SODIUM CHLORIDE 0.9 % IV BOLUS
500.0000 mL | Freq: Once | INTRAVENOUS | Status: AC
  Administered 2019-07-22: 500 mL via INTRAVENOUS

## 2019-07-22 MED ORDER — ACETAMINOPHEN 650 MG RE SUPP: 650.0000 mg | Freq: Four times a day (QID) | RECTAL | Status: DC | PRN

## 2019-07-22 MED ORDER — APIXABAN 2.5 MG PO TABS
2.5000 mg | ORAL_TABLET | Freq: Two times a day (BID) | ORAL | Status: DC
  Administered 2019-07-22 – 2019-08-02 (×22): 2.5 mg via ORAL
  Filled 2019-07-22 (×23): qty 1

## 2019-07-22 MED ORDER — ALBUTEROL SULFATE (2.5 MG/3ML) 0.083% IN NEBU: 2.5000 mg | INHALATION_SOLUTION | Freq: Once | RESPIRATORY_TRACT | Status: DC

## 2019-07-22 MED ORDER — ACETAMINOPHEN 325 MG PO TABS
650.0000 mg | ORAL_TABLET | Freq: Four times a day (QID) | ORAL | Status: DC | PRN
  Administered 2019-07-22 – 2019-07-25 (×4): 650 mg via ORAL
  Filled 2019-07-22 (×4): qty 2

## 2019-07-22 MED ORDER — CHLORHEXIDINE GLUCONATE CLOTH 2 % EX PADS
6.0000 | MEDICATED_PAD | Freq: Every day | CUTANEOUS | Status: DC
  Administered 2019-07-22: 6 via TOPICAL

## 2019-07-22 MED ORDER — SODIUM CHLORIDE 0.9 % IV SOLN
2.0000 g | Freq: Once | INTRAVENOUS | Status: AC
  Administered 2019-07-22: 2 g via INTRAVENOUS
  Filled 2019-07-22: qty 2

## 2019-07-22 MED ORDER — NOREPINEPHRINE 16 MG/250ML-% IV SOLN
0.0000 ug/min | INTRAVENOUS | Status: DC
  Administered 2019-07-22 – 2019-07-23 (×2): 10 ug/min via INTRAVENOUS
  Filled 2019-07-22 (×3): qty 250

## 2019-07-22 MED ORDER — ORAL CARE MOUTH RINSE
15.0000 mL | Freq: Two times a day (BID) | OROMUCOSAL | Status: DC
  Administered 2019-07-22 – 2019-08-02 (×17): 15 mL via OROMUCOSAL

## 2019-07-22 MED ORDER — ONDANSETRON HCL 4 MG/2ML IJ SOLN: 4.0000 mg | Freq: Four times a day (QID) | INTRAMUSCULAR | Status: DC | PRN

## 2019-07-22 MED ORDER — CHLORHEXIDINE GLUCONATE CLOTH 2 % EX PADS
6.0000 | MEDICATED_PAD | Freq: Every day | CUTANEOUS | Status: DC
  Administered 2019-07-23 – 2019-07-27 (×5): 6 via TOPICAL

## 2019-07-22 MED ORDER — INFLUENZA VAC SPLIT QUAD 0.5 ML IM SUSY
0.5000 mL | PREFILLED_SYRINGE | INTRAMUSCULAR | Status: DC
  Filled 2019-07-22: qty 0.5

## 2019-07-22 MED ORDER — NOREPINEPHRINE 4 MG/250ML-% IV SOLN
0.0000 ug/min | INTRAVENOUS | Status: DC
  Administered 2019-07-22: 10 ug/min via INTRAVENOUS
  Administered 2019-07-22: 5 ug/min via INTRAVENOUS
  Filled 2019-07-22: qty 250

## 2019-07-22 MED ORDER — SODIUM CHLORIDE 0.9 % IV SOLN
INTRAVENOUS | Status: DC
  Administered 2019-07-22 – 2019-07-23 (×4): via INTRAVENOUS

## 2019-07-22 MED ORDER — SODIUM CHLORIDE 0.9 % IV SOLN
1.0000 g | INTRAVENOUS | Status: DC
  Administered 2019-07-22 – 2019-07-23 (×2): 1 g via INTRAVENOUS
  Filled 2019-07-22: qty 1
  Filled 2019-07-22: qty 10
  Filled 2019-07-22: qty 1

## 2019-07-22 MED ORDER — LIOTHYRONINE SODIUM 25 MCG PO TABS
25.0000 ug | ORAL_TABLET | Freq: Every day | ORAL | Status: DC
  Administered 2019-07-23 – 2019-07-31 (×8): 25 ug via ORAL
  Filled 2019-07-22 (×12): qty 1

## 2019-07-22 MED ORDER — FENTANYL CITRATE (PF) 100 MCG/2ML IJ SOLN: 50.0000 ug | INTRAMUSCULAR | Status: DC | PRN

## 2019-07-22 MED ORDER — LEVOTHYROXINE SODIUM 100 MCG PO TABS
200.0000 ug | ORAL_TABLET | Freq: Every day | ORAL | Status: DC
  Administered 2019-07-23 – 2019-08-02 (×11): 200 ug via ORAL
  Filled 2019-07-22 (×13): qty 2

## 2019-07-22 MED ORDER — SODIUM CHLORIDE 0.9 % IV SOLN
10.0000 mL/h | Freq: Once | INTRAVENOUS | Status: AC
  Administered 2019-07-22: 10 mL/h via INTRAVENOUS

## 2019-07-22 MED ORDER — VANCOMYCIN HCL IN DEXTROSE 1-5 GM/200ML-% IV SOLN
1000.0000 mg | Freq: Once | INTRAVENOUS | Status: AC
  Administered 2019-07-22: 1000 mg via INTRAVENOUS
  Filled 2019-07-22: qty 200

## 2019-07-22 MED ORDER — SODIUM CHLORIDE 0.9 % IV SOLN
Freq: Once | INTRAVENOUS | Status: AC
  Administered 2019-07-22: 11:00:00 via INTRAVENOUS

## 2019-07-22 MED ORDER — VANCOMYCIN HCL IN DEXTROSE 1-5 GM/200ML-% IV SOLN
1000.0000 mg | Freq: Once | INTRAVENOUS | Status: DC
  Filled 2019-07-22: qty 200

## 2019-07-22 MED ORDER — HYDROCODONE-ACETAMINOPHEN 5-325 MG PO TABS
1.0000 | ORAL_TABLET | ORAL | Status: DC | PRN
  Administered 2019-07-22 (×3): 1 via ORAL
  Administered 2019-07-23: 2 via ORAL
  Administered 2019-07-23: 1 via ORAL
  Administered 2019-07-23 – 2019-07-26 (×15): 2 via ORAL
  Administered 2019-07-26 – 2019-07-27 (×3): 1 via ORAL
  Administered 2019-07-27: 2 via ORAL
  Administered 2019-07-27: 1 via ORAL
  Administered 2019-07-28 – 2019-08-02 (×27): 2 via ORAL
  Filled 2019-07-22 (×3): qty 2
  Filled 2019-07-22: qty 1
  Filled 2019-07-22 (×2): qty 2
  Filled 2019-07-22: qty 1
  Filled 2019-07-22 (×8): qty 2
  Filled 2019-07-22: qty 1
  Filled 2019-07-22 (×18): qty 2
  Filled 2019-07-22: qty 1
  Filled 2019-07-22 (×2): qty 2
  Filled 2019-07-22 (×2): qty 1
  Filled 2019-07-22 (×3): qty 2
  Filled 2019-07-22 (×2): qty 1
  Filled 2019-07-22 (×9): qty 2

## 2019-07-22 MED ORDER — SODIUM CHLORIDE 0.9 % IV BOLUS
1000.0000 mL | Freq: Once | INTRAVENOUS | Status: AC
  Administered 2019-07-22: 1000 mL via INTRAVENOUS

## 2019-07-22 NOTE — Plan of Care (Signed)
Pt and spouse oriented to ICU, 5 liters [place], pt tearful at times, states she did stop drinking fluids at [employer] [facility] d/t they did not respond for an hours sometimes to calls for bedpan, did stand at bedside Saturday with PT at the facility, attempting to titrate levophed off, NS infusing at 75 ml/hr.  Rt arm edematous and bruised, she has had [place] in past two weeks to confirm no DVT. Purewick in place.

## 2019-07-22 NOTE — ED Notes (Signed)
Daughter upset on cell phone in room at this time due to [facility] not having any available beds to transfer patient. MD offered transfer to [place] and patient refused

## 2019-07-22 NOTE — H&P (Addendum)
[place] [facility] [place] NAME: Regina White    MR#:  [id number]  DATE OF BIRTH:  02/05/1956  DATE OF ADMISSION:  07/22/2019  PRIMARY CARE PHYSICIAN: Jacklynn Barnacle, MD   REQUESTING/REFERRING PHYSICIAN: dr Quentin Cornwall  CHIEF COMPLAINT:   Low BP HISTORY OF PRESENT ILLNESS:  Regina White  is a 63 y.o. female with a known history of recent hospitalization at [facility] for hemorrhagic shock due to acute blood loss anemia in the setting of supratherapeutic Lovenox after recent vascular stenting procedure, HFrEF, OSA, HTN , chronic pain with morphine pump, PAD and severe pulmonary hypertension who presents emergency room due to low blood pressure.  She is at [employer].  Staff went to obtain vitals and her blood pressure was 51/38.  She has been subsequently given fluid boluses and started on pressors.  Patient reports over the past several days she has not wanted to drink because she has not wanted to use the bathroom. She is noted to be profoundly hypovolemic.  She denies melena or hematochezia.  She denies chest pain.  Family wanted transfer to [facility] but they are on diversion. PAST MEDICAL HISTORY:   Past Medical History:  Diagnosis Date  . Asthma   . CHF (congestive heart failure) ([place])   . Chronic back pain   . COPD (chronic obstructive pulmonary disease) ([place])   . Pulmonary hypertension ([place])     PAST SURGICAL HISTORY:   Past Surgical History:  Procedure Laterality Date  . BACK SURGERY      SOCIAL HISTORY:   Social History   Tobacco Use  . Smoking status: Former Smoker    Packs/day: 1.00    Years: 30.00    Pack years: 30.00    Types: Cigarettes    Quit date: 06/09/1999    Years since quitting: 20.1  . Smokeless tobacco: Never Used  Substance Use Topics  . Alcohol use: No    FAMILY HISTORY:   Family History  Problem Relation Age of Onset  . Hypertension Mother   . Liver cancer Father     DRUG ALLERGIES:    Allergies  Allergen Reactions  . Penicillins Anaphylaxis    BREATHING PROBLEMS  . Erythromycin Nausea And Vomiting and Rash    STOMACH PAIN  . Levofloxacin     Other reaction(s): Other MUSCLE ACHES  . Lidocaine Rash  . Clindamycin     Recurrent C diff  . Levothyroxine Other (See Comments)    Vaginal bleeding Patient used BRAND ONLY SYNTHROID  . Tiotropium Other (See Comments)    Coughing up blood  . Doxycycline Nausea And Vomiting  . Sulfa Antibiotics Rash    REVIEW OF SYSTEMS:   Review of Systems  Constitutional: Positive for malaise/fatigue. Negative for chills and fever.  HENT: Negative.  Negative for ear discharge, ear pain, hearing loss, nosebleeds and sore throat.   Eyes: Negative.  Negative for blurred vision and pain.  Respiratory: Negative.  Negative for cough, hemoptysis, shortness of breath and wheezing.   Cardiovascular: Negative.  Negative for chest pain, palpitations, leg swelling and PND.  Gastrointestinal: Negative.  Negative for abdominal pain, blood in stool, diarrhea, nausea and vomiting.  Genitourinary: Negative.  Negative for dysuria.  Musculoskeletal: Negative.  Negative for back pain.  Skin: Negative.   Neurological: Negative for dizziness, tremors, speech change, focal weakness, seizures and headaches.  Endo/Heme/Allergies: Negative.  Does not bruise/bleed easily.  Psychiatric/Behavioral: Negative.  Negative for depression, hallucinations and suicidal ideas.  MEDICATIONS AT HOME:   Prior to Admission medications   Medication Sig Start Date End Date Taking? Authorizing Provider  albuterol (VENTOLIN HFA) 108 (90 Base) MCG/ACT inhaler Inhale 2 puffs into the lungs every 6 (six) hours as needed for wheezing or shortness of breath.   Yes [provider]  apixaban (ELIQUIS) 2.5 MG TABS tablet Take 2.5 mg by mouth 2 (two) times daily. 07/18/19  Yes [provider]  bisoprolol (ZEBETA) 5 MG tablet Take 5 mg by mouth daily. 10/12/18  10/12/19 Yes [provider]  bumetanide (BUMEX) 1 MG tablet Take 0.5 mg by mouth daily.  10/11/18 10/11/19 Yes [provider]  cyclobenzaprine (FLEXERIL) 10 MG tablet Take 10 mg by mouth every evening. 07/06/19  Yes [provider]  Eszopiclone 3 MG TABS Take 3 mg by mouth at bedtime. 06/18/19 09/16/19 Yes [provider]  famotidine (PEPCID) 20 MG tablet Take 20 mg by mouth 2 (two) times daily. 06/19/18  Yes [provider]  Fluticasone-Salmeterol (ADVAIR) 500-50 MCG/DOSE AEPB Inhale 1 puff into the lungs 2 (two) times daily. 10/11/18  Yes [provider]  gabapentin (NEURONTIN) 300 MG capsule Take 300-600 mg by mouth 3 (three) times daily. 07/18/19  Yes [provider]  ipratropium (ATROVENT HFA) 17 MCG/ACT inhaler INHALE TWO PUFFS BY MOUTH FOUR TIMES A DAY 07/17/18  Yes [provider]  levothyroxine (SYNTHROID) 200 MCG tablet Take 200 mcg by mouth daily. 11/17/18  Yes [provider]  lidocaine (LIDODERM) 5 % Place 1 patch onto the skin every 12 (twelve) hours. 07/19/19 08/18/19 Yes [provider]  montelukast (SINGULAIR) 10 MG tablet Take 10 mg by mouth daily.   Yes [provider]  naloxone (NARCAN) 4 MG/0.1ML LIQD nasal spray kit Place 1 spray into the nose once.   Yes [provider]  Oxycodone HCl 10 MG TABS Take 10 mg by mouth every 4 (four) hours as needed for pain. 07/18/19 07/22/19 Yes [provider]  polyethylene glycol (MIRALAX / GLYCOLAX) 17 g packet Take 17 g by mouth daily as needed for constipation. 07/18/19 08/17/19 Yes [provider]  sacubitril-valsartan (ENTRESTO) 49-51 MG Take 1 tablet by mouth daily. 10/11/18 10/11/19 Yes [provider]  Vitamin D, Cholecalciferol, 25 MCG (1000 UT) TABS Take 2,000 Units by mouth daily.   Yes [provider]  acetaminophen (TYLENOL) 500 MG tablet Take 500-1,000 mg by mouth every 8 (eight) hours as needed. 11/24/18    [provider]  liothyronine (CYTOMEL) 25 MCG tablet Take 25 mcg by mouth daily.    [provider]  lisinopril (PRINIVIL,ZESTRIL) 20 MG tablet Take 20 mg by mouth daily.    [provider]  metoCLOPramide (REGLAN) 10 MG tablet Take 10 mg by mouth daily as needed. 09/19/18   [provider]      VITAL SIGNS:  Blood pressure 100/85, pulse (!) 56, temperature 98.5 F (36.9 C), temperature source Oral, resp. rate 19, height _0  (1.651 m), weight 95.7 kg, SpO2 93 %.  PHYSICAL EXAMINATION:   Physical Exam Constitutional:      General: She is not in acute distress.    Appearance: She is obese.     Comments: Obese  HENT:     Head: Normocephalic.  Eyes:     General: No scleral icterus. Neck:     Musculoskeletal: Normal range of motion and neck supple.     Vascular: No JVD.     Trachea: No tracheal deviation.  Cardiovascular:  Rate and Rhythm: Normal rate and regular rhythm.     Heart sounds: Murmur present. No friction rub. No gallop.   Pulmonary:     Effort: Pulmonary effort is normal. No respiratory distress.     Breath sounds: Normal breath sounds. No wheezing or rales.  Chest:     Chest wall: No tenderness.  Abdominal:     General: Bowel sounds are normal. There is no distension.     Palpations: Abdomen is soft. There is no mass.     Tenderness: There is no abdominal tenderness. There is no guarding or rebound.  Musculoskeletal: Normal range of motion.     Comments: RUE swelling old bruising Left leg big toe amputation and tawny colored  Skin:    General: Skin is warm.     Findings: No erythema or rash.  Neurological:     Mental Status: She is alert and oriented to person, place, and time.  Psychiatric:        Judgment: Judgment normal.       LABORATORY PANEL:   CBC Recent Labs  Lab 07/22/19 0916  WBC 7.4  HGB 8.7*  HCT 29.4*  PLT 326    ------------------------------------------------------------------------------------------------------------------  Chemistries  Recent Labs  Lab 07/22/19 0916  NA 135  K 4.6  CL 100  CO2 24  GLUCOSE 120*  BUN 65*  CREATININE 2.81*  CALCIUM 7.9*  AST 12*  ALT 9  ALKPHOS 73  BILITOT 0.8   ------------------------------------------------------------------------------------------------------------------  Cardiac Enzymes No results for input(s): TROPONINI in the last 168 hours. ------------------------------------------------------------------------------------------------------------------  RADIOLOGY:  Dg Chest Port 1 View  Result Date: 07/22/2019 CLINICAL DATA:  Chest pain and shortness-of-breath. EXAM: PORTABLE CHEST 1 VIEW COMPARISON:  12/04/2018 FINDINGS: Lungs are adequately inflated with minimal prominence of the perihilar markings suggesting mild vascular congestion. No lobar consolidation or effusion. Mild stable cardiomegaly. Stable prominence of the main pulmonary artery segment. Remainder of the exam is unchanged. IMPRESSION: Mild stable cardiomegaly with suggestion of minimal vascular congestion. Electronically Signed   By: Marin Olp M.D.   On: 07/22/2019 09:32    EKG:  NSR no ST elelvation/depression Lateral T wave invesrions   IMPRESSION AND PLAN:    63 year old female with past medical history of pulmonary hypertension, COPD/OSA, chronic pain with morphine pump and recent hospitalization at [facility] for hemorrhagic shock secondary to acute pulse anemia who presented to ER due to hypotension.  1.  Hypovolemic shock with acute kidney injury: Patient has purposely not taking in adequate amounts of fluid which leads to acute kidney injury and hypotension.  She has also been  receiving her hypertensive medications at the facility while having decreased oral intake which has decreased her BP significantly. Hemoglobin is stable.  There is no evidence of hemorrhagic  shock.  There is no evidence of septic shock.  Chest x-ray and UA are essentially unremarkable. Consult Dr Alva Garnet placed via [place] negative  2.  Acute kidney injury with prerenal azotemia: This is due to poor p.o. intake. I would not be surprised if her creatinine increased tomorrow and she shows sign of ATN. Hold nephrotoxic medications and continue IV fluids BMP in a.m.   3.  History of VTE: Hematology recommends long-term anticoagulation due to the following risk factors: long-term anticoagulation plan, she continues to have ongoing risk factors of 1) Obesity 2) varicose veins with chronic venous insufficiency 3) prior VTE Recommendation is for lower dose apixiban of 2.5 mg bid at this time  4. HFrEF: Most recent  echocardiogram at [facility] shows ejection fraction has improved. Due to low blood pressure beta-blocker, Entresto and Bumex are on hold.  5.  Chronic hypoxic respiratory failure with COPD/OSA: No signs of exacerbation  6.  Essential hypertension: Hold all medications due to hypovolemic shock.  7.  Chronic pain with morphine pump  8.  Hypothyroidism: Continue Synthroid/Cytomel   9. PAD with recent stent left leg and admission with hemorrhagic shock: Hemoglobin is stable at this time.  Continue to monitor. She will continue Eliquis.   All the records are reviewed and case discussed with ED provider. Management plans discussed with the patient and she in agreement  CODE STATUS: FULL  CRITICAL CARE TOTAL TIME TAKING CARE OF THIS PATIENT: 60 minutes.    Bettey Costa M.D on 07/22/2019 at 11:38 AM  Between 7am to 6pm - Pager - [phone]  After 6pm go to [email or web address] - password [email or web address] [facility]  Office  [phone]  CC: Primary care physician; Jacklynn Barnacle, MD

## 2019-07-22 NOTE — Consult Note (Signed)
PULMONARY/CCM CONSULT NOTE  Requesting MD/Service: Hospitalist service Date of initial consultation: 07/22/2019 Reason for consultation: Hypotension  HPI: 63 y.o. female with chronic pain syndrome, recurrent C diff infections, recent DVT, large RUE hematoma while on enoxaparin recently transferred from [place] to [place] to facilitate rehabilitation after venogram.  She has been largely immobile at [facility] with notably poor oral intake.  On the morning of this consultation, she was found to be hypotensive but asymptomatic.  Therefore, she was transferred to [facility] ED where she received volume resuscitation and norepinephrine infusion was initiated.  She was admitted with a diagnosis of hypovolemic (and possible septic) shock with acute kidney injury.  At the time of my evaluation, she has no specific complaints.  She denies headache, sinus symptoms, otalgia, sore throat, nuchal rigidity, chest pain, cough, purulent sputum, hemoptysis, nausea, vomiting, diarrhea, dysuria, lower extremity edema, calf tenderness.  Her only complaint is RUE pain related to hematoma that has been present for several days or more.   Past Medical History:  Diagnosis Date  . Asthma   . CHF (congestive heart failure) ([place])   . Chronic back pain   . COPD (chronic obstructive pulmonary disease) ([place])   . Pulmonary hypertension (HCC)   Recurrent C. difficile infections  Past Surgical History:  Procedure Laterality Date  . BACK SURGERY      Current Outpatient Medications  Medication Instructions  . acetaminophen (TYLENOL) 500-1,000 mg, Oral, Every 8 hours PRN  . albuterol (VENTOLIN HFA) 108 (90 Base) MCG/ACT inhaler 2 puffs, Inhalation, Every 6 hours PRN  . apixaban (ELIQUIS) 2.5 mg, Oral, 2 times daily  . bisoprolol (ZEBETA) 5 mg, Oral, Daily  . bumetanide (BUMEX) 0.5 mg, Oral, Daily  . cyclobenzaprine (FLEXERIL) 10 mg, Oral, Every evening  . Eszopiclone 3 mg, Oral, Daily at bedtime  .  famotidine (PEPCID) 20 mg, Oral, 2 times daily  . Fluticasone-Salmeterol (ADVAIR) 500-50 MCG/DOSE AEPB 1 puff, Inhalation, 2 times daily  . gabapentin (NEURONTIN) 300-600 mg, Oral, 3 times daily  . ipratropium (ATROVENT HFA) 17 MCG/ACT inhaler INHALE TWO PUFFS BY MOUTH FOUR TIMES A DAY  . levothyroxine (SYNTHROID) 200 mcg, Oral, Daily  . lidocaine (LIDODERM) 5 % 1 patch, Transdermal, Every 12 hours  . liothyronine (CYTOMEL) 25 mcg, Oral, Daily  . lisinopril (ZESTRIL) 20 mg, Oral, Daily  . metoCLOPramide (REGLAN) 10 mg, Oral, Daily PRN  . montelukast (SINGULAIR) 10 mg, Oral, Daily  . naloxone (NARCAN) 4 MG/0.1ML LIQD nasal spray kit 1 spray, Nasal,  Once  . Oxycodone HCl 10 mg, Oral, Every 4 hours PRN  . polyethylene glycol (MIRALAX / GLYCOLAX) 17 g, Oral, Daily PRN  . sacubitril-valsartan (ENTRESTO) 49-51 MG 1 tablet, Oral, Daily  . Vitamin D (Cholecalciferol) 2,000 Units, Oral, Daily    Social History   Socioeconomic History  . Marital status: Married    Spouse name: Not on file  . Number of children: Not on file  . Years of education: Not on file  . Highest education level: Not on file  Occupational History  . Not on file  Social Needs  . Financial resource strain: Not on file  . Food insecurity    Worry: Not on file    Inability: Not on file  . Transportation needs    Medical: Not on file    Non-medical: Not on file  Tobacco Use  . Smoking status: Former Smoker    Packs/day: 1.00    Years: 30.00    Pack years:  30.00    Types: Cigarettes    Quit date: 06/09/1999    Years since quitting: 20.1  . Smokeless tobacco: Never Used  Substance and Sexual Activity  . Alcohol use: No  . Drug use: Never  . Sexual activity: Not Currently  Lifestyle  . Physical activity    Days per week: Not on file    Minutes per session: Not on file  . Stress: Not on file  Relationships  . Social Herbalist on phone: Not on file    Gets together: Not on file    Attends religious  service: Not on file    Active member of club or organization: Not on file    Attends meetings of clubs or organizations: Not on file    Relationship status: Not on file  . Intimate partner violence    Fear of current or ex partner: Not on file    Emotionally abused: Not on file    Physically abused: Not on file    Forced sexual activity: Not on file  Other Topics Concern  . Not on file  Social History Narrative   Living at [employer] at this time    Family History  Problem Relation Age of Onset  . Hypertension Mother   . Liver cancer Father     ROS: No fever, myalgias/arthralgias, unexplained weight loss or weight gain No new focal weakness or sensory deficits No otalgia, hearing loss, visual changes, nasal and sinus symptoms, mouth and throat problems No neck pain or adenopathy No abdominal pain, N/V/D, diarrhea, change in bowel pattern No dysuria, change in urinary pattern   Vitals:   07/22/19 1257 07/22/19 1300 07/22/19 1400 07/22/19 1500  BP: (!) 84/59 (!) 87/49 115/74 (!) 95/56  Pulse: 63 69 62 60  Resp: 20 (!) 21 (!) 24 18  Temp: 97.7 F (36.5 C) 97.7 F (36.5 C)    TempSrc: Oral Oral    SpO2: 90% 90% (!) 88% (!) 89%  Weight: 102.3 kg     Height: '5\' 5"'$  (1.651 m)        EXAM:  Gen: Chronically ill-appearing in NAD HEENT: NCAT, sclerae white, oropharynx normal Neck: No LAN, no JVD noted Lungs: full BS, normal percussion note throughout, no adventitious sounds Cardiovascular: Regular, normal rate, no M noted Abdomen: Soft, NT, +BS Ext: RUE with induration and tenderness in upper arm Neuro: PERRL, EOMI, motor/sensory grossly intact Skin: No lesions noted   DATA:   BMP Latest Ref Rng & Units 07/22/2019 12/04/2018 06/02/2018  Glucose 70 - 99 mg/dL 120(H) 151(H) 149(H)  BUN 8 - 23 mg/dL 65(H) 100(H) 106(H)  Creatinine 0.44 - 1.00 mg/dL 2.81(H) 5.93(H) 6.02(H)  Sodium 135 - 145 mmol/L 135 133(L) 136  Potassium 3.5 - 5.1 mmol/L 4.6 4.4 4.8  Chloride 98 -  111 mmol/L 100 99 102  CO2 22 - 32 mmol/L 24 13(L) 17(L)  Calcium 8.9 - 10.3 mg/dL 7.9(L) 7.4(L) 6.4(LL)    CBC Latest Ref Rng & Units 07/22/2019 12/04/2018 06/02/2018  WBC 4.0 - 10.5 K/uL 7.4 11.0(H) 6.7  Hemoglobin 12.0 - 15.0 g/dL 8.7(L) 12.5 12.7  Hematocrit 36.0 - 46.0 % 29.4(L) 40.9 39.5  Platelets 150 - 400 K/uL 326 358 168    CXR: Cardiomegaly and interstitial prominence  I have personally reviewed all chest radiographs reported above including CXRs and CT chest unless otherwise indicated  IMPRESSION:     ICD-10-CM   1. Hypotension, unspecified hypotension type  I95.9  2. Sepsis, due to unspecified organism, unspecified whether acute organ dysfunction present ([place])  A41.9   3. AKI - likely hypovolemic and ARB/ACEI therapy 4. 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 History of cardiomyopathy (LVEF 40% in 05/2018)  PLAN:  Empiric antibiotic Volume resuscitate Norepinephrine to maintain MAP >65 mmHg Monitor in ICU until off of vasopressors Supplemental O2  Merton Border, MD PCCM service Mobile [phone] Pager [phone] 07/22/2019 3:25 PM

## 2019-07-22 NOTE — ED Notes (Signed)
Patient being transported by Carney Harder, RN to ICU 17 at this time

## 2019-07-22 NOTE — Progress Notes (Signed)
   07/22/19 1515  Clinical Encounter Type  Visited With Patient and family together  Visit Type Initial  Referral From Nurse  Consult/Referral To Chaplain  Spiritual Encounters  Spiritual Needs Emotional;Other (Comment)  [place] received OR for prayer with patient. CH entered room and patient's husband was present. Patient was alert and awake. [place] introduced self and completed initial spiritual care screening. Pt declined pastoral visit. No further needs at this time.

## 2019-07-22 NOTE — ED Notes (Signed)
Dr. Mody at bedside.  

## 2019-07-22 NOTE — ED Provider Notes (Signed)
[facility] Emergency Department Provider Note    First MD Initiated Contact with Patient 07/22/19 [phone]     (approximate)  I have reviewed the triage vital signs and the nursing notes.   HISTORY  Chief Complaint Hypotension    HPI Regina White is a 63 y.o. female with extensive past medical history recent mission ANC complicated today secondary to hemorrhagic shock presents the ER for evaluation of low blood pressure.  States that she was otherwise feeling well this morning was coming from [facility] and had routine blood pressure check which was significantly hypotensive to the 50s.  States she does feel somewhat fatigued but denies any pain.  Feels that the right upper extremity swelling is actually improving from previous.  She was restarted on apixaban 2.5 mg.  Has not had any measured fevers since her admission.    Past Medical History:  Diagnosis Date  . Asthma   . CHF (congestive heart failure) ([place])   . Chronic back pain   . COPD (chronic obstructive pulmonary disease) ([place])   . Pulmonary hypertension (HCC)    Family History  Problem Relation Age of Onset  . Hypertension Mother   . Liver cancer Father    Past Surgical History:  Procedure Laterality Date  . BACK SURGERY     Patient Active Problem List   Diagnosis Date Noted  . Hypovolemic shock ([place]) 07/22/2019  . Acute respiratory failure with hypoxemia ([place]) 12/04/2018  . Acute respiratory failure with hypoxia ([place]) 06/02/2018  . Diarrhea of presumed infectious origin 02/17/2016  . Right acute serous otitis media 02/17/2016  . COPD (chronic obstructive pulmonary disease) ([place]) 12/23/2015  . Heart failure with preserved ejection fraction ([place]) 12/23/2015  . DDD (degenerative disc disease) 09/25/2013  . Narrowing of intervertebral disc space 09/25/2013  . OSA (obstructive sleep apnea) 07/25/2013  . Deep vein thrombosis (DVT) ([place]) 06/25/2013  . Erythema 06/18/2013  . Left leg  pain 06/18/2013  . Mediastinal lymphadenopathy 06/18/2013  . DVT, lower extremity ([place]) 06/14/2013  . Pulmonary hypertension ([place]) 06/14/2013  . Asthma 09/21/2010  . Chronic pain 09/21/2010  . Hypertension, benign 09/21/2010  . Hypothyroidism 09/21/2010  . Venous stasis 09/21/2010      Prior to Admission medications   Medication Sig Start Date End Date Taking? Authorizing Provider  albuterol (VENTOLIN HFA) 108 (90 Base) MCG/ACT inhaler Inhale 2 puffs into the lungs every 6 (six) hours as needed for wheezing or shortness of breath.   Yes [provider]  apixaban (ELIQUIS) 2.5 MG TABS tablet Take 2.5 mg by mouth 2 (two) times daily. 07/18/19  Yes [provider]  bisoprolol (ZEBETA) 5 MG tablet Take 5 mg by mouth daily. 10/12/18 10/12/19 Yes [provider]  bumetanide (BUMEX) 1 MG tablet Take 0.5 mg by mouth daily.  10/11/18 10/11/19 Yes [provider]  cyclobenzaprine (FLEXERIL) 10 MG tablet Take 10 mg by mouth every evening. 07/06/19  Yes [provider]  Eszopiclone 3 MG TABS Take 3 mg by mouth at bedtime. 06/18/19 09/16/19 Yes [provider]  famotidine (PEPCID) 20 MG tablet Take 20 mg by mouth 2 (two) times daily. 06/19/18  Yes [provider]  Fluticasone-Salmeterol (ADVAIR) 500-50 MCG/DOSE AEPB Inhale 1 puff into the lungs 2 (two) times daily. 10/11/18  Yes [provider]  gabapentin (NEURONTIN) 300 MG capsule Take 300-600 mg by mouth 3 (three) times daily. 07/18/19  Yes [provider]  ipratropium (ATROVENT HFA) 17 MCG/ACT inhaler INHALE  TWO PUFFS BY MOUTH FOUR TIMES A DAY 07/17/18  Yes [provider]  levothyroxine (SYNTHROID) 200 MCG tablet Take 200 mcg by mouth daily. 11/17/18  Yes [provider]  lidocaine (LIDODERM) 5 % Place 1 patch onto the skin every 12 (twelve) hours. 07/19/19 08/18/19 Yes [provider]  montelukast (SINGULAIR) 10 MG tablet Take 10 mg by mouth daily.   Yes  [provider]  naloxone (NARCAN) 4 MG/0.1ML LIQD nasal spray kit Place 1 spray into the nose once.   Yes [provider]  Oxycodone HCl 10 MG TABS Take 10 mg by mouth every 4 (four) hours as needed for pain. 07/18/19 07/22/19 Yes [provider]  polyethylene glycol (MIRALAX / GLYCOLAX) 17 g packet Take 17 g by mouth daily as needed for constipation. 07/18/19 08/17/19 Yes [provider]  sacubitril-valsartan (ENTRESTO) 49-51 MG Take 1 tablet by mouth daily. 10/11/18 10/11/19 Yes [provider]  Vitamin D, Cholecalciferol, 25 MCG (1000 UT) TABS Take 2,000 Units by mouth daily.   Yes [provider]  acetaminophen (TYLENOL) 500 MG tablet Take 500-1,000 mg by mouth every 8 (eight) hours as needed. 11/24/18   [provider]  liothyronine (CYTOMEL) 25 MCG tablet Take 25 mcg by mouth daily.    [provider]  lisinopril (PRINIVIL,ZESTRIL) 20 MG tablet Take 20 mg by mouth daily.    [provider]  metoCLOPramide (REGLAN) 10 MG tablet Take 10 mg by mouth daily as needed. 09/19/18   [provider]    Allergies Penicillins, Erythromycin, Levofloxacin, Lidocaine, Clindamycin, Levothyroxine, Tiotropium, Doxycycline, and Sulfa antibiotics    Social History Social History   Tobacco Use  . Smoking status: Former Smoker    Packs/day: 1.00    Years: 30.00    Pack years: 30.00    Types: Cigarettes    Quit date: 06/09/1999    Years since quitting: 20.1  . Smokeless tobacco: Never Used  Substance Use Topics  . Alcohol use: No  . Drug use: Never    Review of Systems Patient denies headaches, rhinorrhea, blurry vision, numbness, shortness of breath, chest pain, edema, cough, abdominal pain, nausea, vomiting, diarrhea, dysuria, fevers, rashes or hallucinations unless otherwise stated above in HPI. ____________________________________________   PHYSICAL EXAM:  VITAL SIGNS: Vitals:   07/22/19 1257 07/22/19 1300   BP: (!) 84/59 (!) 87/49  Pulse: 63 69  Resp: 20 (!) 21  Temp: 97.7 F (36.5 C) 97.7 F (36.5 C)  SpO2: 90% 90%    Constitutional: Alert and oriented.  Eyes: Conjunctivae are normal.  Head: Atraumatic. Nose: No congestion/rhinnorhea. Mouth/Throat: Mucous membranes are moist.   Neck: No stridor. Painless ROM.  Cardiovascular: Normal rate, regular rhythm. Grossly normal heart sounds.  Good peripheral circulation. Respiratory: Normal respiratory effort.  No retractions. Lungs CTAB. Gastrointestinal: Soft and nontender. No distention. No abdominal bruits. No CVA tenderness. Genitourinary:  Musculoskeletal: RUE swelling with age indeterminate ecchymosis.  Good cap refill distally. No lower extremity tenderness nor edema.  No joint effusions. Neurologic:  Normal speech and language. No gross focal neurologic deficits are appreciated. No facial droop Skin:  Skin is warm, dry and intact. No rash noted. Psychiatric: Mood and affect are normal. Speech and behavior are normal.  ____________________________________________   LABS (all labs ordered are listed, but only abnormal results are displayed)  Results for orders placed or performed during the hospital encounter of 07/22/19 (from the past 24 hour(s))  Comprehensive metabolic panel     Status: Abnormal  Collection Time: 07/22/19  9:16 AM  Result Value Ref Range   Sodium 135 135 - 145 mmol/L   Potassium 4.6 3.5 - 5.1 mmol/L   Chloride 100 98 - 111 mmol/L   CO2 24 22 - 32 mmol/L   Glucose, Bld 120 (H) 70 - 99 mg/dL   BUN 65 (H) 8 - 23 mg/dL   Creatinine, Ser 2.81 (H) 0.44 - 1.00 mg/dL   Calcium 7.9 (L) 8.9 - 10.3 mg/dL   Total Protein 5.3 (L) 6.5 - 8.1 g/dL   Albumin 2.8 (L) 3.5 - 5.0 g/dL   AST 12 (L) 15 - 41 U/L   ALT 9 0 - 44 U/L   Alkaline Phosphatase 73 38 - 126 U/L   Total Bilirubin 0.8 0.3 - 1.2 mg/dL   GFR calc non Af Amer 17 (L) >60 mL/min   GFR calc Af Amer 20 (L) >60 mL/min   Anion gap 11 5 - 15  CBC WITH  DIFFERENTIAL     Status: Abnormal   Collection Time: 07/22/19  9:16 AM  Result Value Ref Range   WBC 7.4 4.0 - 10.5 K/uL   RBC 3.06 (L) 3.87 - 5.11 MIL/uL   Hemoglobin 8.7 (L) 12.0 - 15.0 g/dL   HCT 29.4 (L) 36.0 - 46.0 %   MCV 96.1 80.0 - 100.0 fL   MCH 28.4 26.0 - 34.0 pg   MCHC 29.6 (L) 30.0 - 36.0 g/dL   RDW 18.4 (H) 11.5 - 15.5 %   Platelets 326 150 - 400 K/uL   nRBC 0.0 0.0 - 0.2 %   Neutrophils Relative % 72 %   Neutro Abs 5.3 1.7 - 7.7 K/uL   Lymphocytes Relative 17 %   Lymphs Abs 1.3 0.7 - 4.0 K/uL   Monocytes Relative 7 %   Monocytes Absolute 0.5 0.1 - 1.0 K/uL   Eosinophils Relative 2 %   Eosinophils Absolute 0.1 0.0 - 0.5 K/uL   Basophils Relative 1 %   Basophils Absolute 0.1 0.0 - 0.1 K/uL   Immature Granulocytes 1 %   Abs Immature Granulocytes 0.09 (H) 0.00 - 0.07 K/uL  Brain natriuretic peptide     Status: Abnormal   Collection Time: 07/22/19  9:16 AM  Result Value Ref Range   B Natriuretic Peptide 276.0 (H) 0.0 - 100.0 pg/mL  Lactic acid, plasma     Status: None   Collection Time: 07/22/19  9:40 AM  Result Value Ref Range   Lactic Acid, Venous 1.4 0.5 - 1.9 mmol/L  Type and screen [place]     Status: None   Collection Time: 07/22/19  9:40 AM  Result Value Ref Range   ABO/RH(D) AB POS    Antibody Screen NEG    Sample Expiration      07/25/2019,2359 Performed at [facility], [address] [place], [place] [address]   Prepare RBC     Status: None (Preliminary result)   Collection Time: 07/22/19  9:40 AM  Result Value Ref Range   Order Confirmation PENDING   Glucose, capillary     Status: None   Collection Time: 07/22/19 10:10 AM  Result Value Ref Range   Glucose-Capillary 80 70 - 99 mg/dL  Urinalysis, Complete w Microscopic     Status: Abnormal   Collection Time: 07/22/19 10:23 AM  Result Value Ref Range   Color, Urine AMBER (A) YELLOW   APPearance TURBID (A) CLEAR   Specific Gravity, Urine 1.016 1.005 - 1.030  pH 5.0 5.0 - 8.0   Glucose, UA NEGATIVE NEGATIVE mg/dL   Hgb urine dipstick NEGATIVE NEGATIVE   Bilirubin Urine NEGATIVE NEGATIVE   Ketones, ur NEGATIVE NEGATIVE mg/dL   Protein, ur 30 (A) NEGATIVE mg/dL   Nitrite NEGATIVE NEGATIVE   Leukocytes,Ua MODERATE (A) NEGATIVE   RBC / HPF 0-5 0 - 5 RBC/hpf   WBC, UA >50 (H) 0 - 5 WBC/hpf   Bacteria, UA MANY (A) NONE SEEN   Squamous Epithelial / LPF 0-5 0 - 5   WBC Clumps PRESENT    Mucus PRESENT    Hyaline Casts, UA PRESENT    Non Squamous Epithelial PRESENT (A) NONE SEEN  SARS Coronavirus 2 [facility] order, Performed in [place] hospital lab) Nasopharyngeal Nasopharyngeal Swab     Status: None   Collection Time: 07/22/19 10:23 AM   Specimen: Nasopharyngeal Swab  Result Value Ref Range   SARS Coronavirus 2 NEGATIVE NEGATIVE  Glucose, capillary     Status: Abnormal   Collection Time: 07/22/19 12:58 PM  Result Value Ref Range   Glucose-Capillary 145 (H) 70 - 99 mg/dL   ____________________________________________  EKG My review and personal interpretation at Time: 10:09   Indication: weakness, hypotensive  Rate: 70  Rhythm: sinus Axis: normal Other: lateral t wave inversions, no stemi ____________________________________________  RADIOLOGY  I personally reviewed all radiographic images ordered to evaluate for the above acute complaints and reviewed radiology reports and findings.  These findings were personally discussed with the patient.  Please see medical record for radiology report.  ____________________________________________   PROCEDURES  Procedure(s) performed:  .Critical Care Performed by: Merlyn Lot, MD Authorized by: Merlyn Lot, MD   Critical care provider statement:    Critical care time (minutes):  30   Critical care time was exclusive of:  Separately billable procedures and treating other patients   Critical care was necessary to treat or prevent imminent or life-threatening deterioration of  the following conditions:  Dehydration and shock   Critical care was time spent personally by me on the following activities:  Development of treatment plan with patient or surrogate, discussions with consultants, evaluation of patient's response to treatment, examination of patient, obtaining history from patient or surrogate, ordering and performing treatments and interventions, ordering and review of laboratory studies, ordering and review of radiographic studies, pulse oximetry, re-evaluation of patient's condition and review of old charts .Central Line  Date/Time: 07/22/2019 12:05 PM Performed by: Merlyn Lot, MD Authorized by: Merlyn Lot, MD   Consent:    Consent obtained:  Emergent situation Pre-procedure details:    Hand hygiene: Hand hygiene performed prior to insertion     Sterile barrier technique: All elements of maximal sterile technique followed     Skin preparation:  2% chlorhexidine   Skin preparation agent: Skin preparation agent completely dried prior to procedure   Procedure details:    Location:  R internal jugular   Patient position:  Trendelenburg   Procedural supplies:  Triple lumen   Landmarks identified: yes     Ultrasound guidance: yes     Sterile ultrasound techniques: Sterile gel and sterile probe covers were used     Number of attempts:  1   Successful placement: yes   Post-procedure details:    Post-procedure:  Dressing applied and line sutured   Assessment:  Blood return through all ports and free fluid flow   Patient tolerance of procedure:  Tolerated well, no immediate complications      Critical Care performed:  yes  ____________________________________________   INITIAL IMPRESSION / ASSESSMENT AND PLAN / ED COURSE  Pertinent labs & imaging results that were available during my care of the patient were reviewed by me and considered in my medical decision making (see chart for details).   DDX: hypotension, sepsis, chf, aki,  electrolyte abn, hemorrhagic shock  Layanna N Borquez is a 63 y.o. who presents to the ED with Fatigue and hypotension as described above.  She is currently afebrile.  Denies any specific complaints but does have extensive and complex past medical history.  She is currently protecting her airway.  Found to be hypoxic.  Review of records does show that she has pulmonary hypertension and some suggestion of congestive heart failure as well as COPD.  Reports decreased p.o. intake over the past 3 days since being discharged.  Suspect dehydration.  Bedside ultrasound does show collapsible IVC.  Will give IV fluids and reassess.  Clinical Course as of Jul 21 1409  Sun Jul 22, 2019  1007 Blood pressure improving with IV hydration.  Blood work does show significant AKI as compared to previous.  Hemoglobin roughly at baseline.  Blood pressure improving after  IVF resuscitation.   [PR]  2458 [id number] with [facility] who is currently on diversion.  Will discuss with hospitalist for admission.   [PR]  1049 Family does not want to be admitted to this facility.  They are demanding admission to [facility].  I informed them to the bed availability we cannot transfer her there at this time.  They are requesting transfer to [facility].  Will touch base with [facility] to see if they have any bed availability.  Have also recommended transfer to [facility] as an alternative patient and family declining that.   [PR]  [facility] is also at capacity.   [PR]  1130 After discussion with ICU attending at [facility] they also do not have any beds.  Will admit here.   [PR]    Clinical Course User Index [PR] Merlyn Lot, MD    The patient was evaluated in Emergency Department today for the symptoms described in the history of present illness. He/she was evaluated in the context of the global COVID-19 pandemic, which necessitated consideration that the patient might be at risk for infection with the SARS-CoV-2 virus that causes  COVID-19. Institutional protocols and algorithms that pertain to the evaluation of patients at risk for COVID-19 are in a state of rapid change based on information released by regulatory bodies including the CDC and federal and state organizations. These policies and algorithms were followed during the patient's care in the ED.  As part of my medical decision making, I reviewed the following data within the [place] notes reviewed and incorporated, Labs reviewed, notes from prior ED visits and [place] Controlled Substance Database   ____________________________________________   FINAL CLINICAL IMPRESSION(S) / ED DIAGNOSES  Final diagnoses:  Hypotension, unspecified hypotension type  Sepsis, due to unspecified organism, unspecified whether acute organ dysfunction present [facility])      NEW MEDICATIONS STARTED DURING THIS VISIT:  Current Discharge Medication List       Note:  This document was prepared using Dragon voice recognition software and may include unintentional dictation errors.    Merlyn Lot, MD 07/22/19 1410

## 2019-07-22 NOTE — ED Notes (Signed)
Pt repositioned and Purewick placed. Linens changed.

## 2019-07-22 NOTE — Progress Notes (Signed)
PHARMACY -  BRIEF ANTIBIOTIC NOTE   Pharmacy has received consult(s) for Vancomycin and Aztreonam from an ED provider.  The patient's profile has been reviewed for ht/wt/allergies/indication/available labs.    One time order(s) placed for Vancomycin 1g and Aztreonam by ED provider, ordered additional Vancomycin 1g to complete 2g loading dose  Further antibiotics/pharmacy consults should be ordered by admitting physician if indicated.                       Thank you, Vira Blanco 07/22/2019  11:20 AM

## 2019-07-22 NOTE — ED Notes (Signed)
MD at bedside with this RN and Caryl Pina RN inserting central line

## 2019-07-22 NOTE — ED Notes (Signed)
Per Dr. Quentin Cornwall, MD pt was put on 2L O2

## 2019-07-22 NOTE — ED Notes (Signed)
Attempted to call report to the ICU.

## 2019-07-22 NOTE — H&P (Signed)
[facility] [place] NAME: Regina White    MR#:  [id number]  DATE OF BIRTH:  02/05/1956  DATE OF ADMISSION:  07/22/2019  PRIMARY CARE PHYSICIAN: Jacklynn Barnacle, MD   REQUESTING/REFERRING PHYSICIAN: dr Quentin Cornwall  CHIEF COMPLAINT:   Low BP HISTORY OF PRESENT ILLNESS:  Regina White  is a 63 y.o. female with a known history of recent hospitalization at [facility] for hemorrhagic shock due to acute blood loss anemia in the setting of supratherapeutic Lovenox after recent vascular stenting procedure, HFrEF, OSA, HTN , chronic pain with morphine pump, PAD and severe pulmonary hypertension who presents emergency room due to low blood pressure.  She is at [employer].  Staff went to obtain vitals and her blood pressure was 51/38.  She has been subsequently given fluid boluses and started on pressors.  Patient reports over the past several days she has not wanted to drink because she has not wanted to use the bathroom. She is noted to be profoundly hypovolemic.  She denies melena or hematochezia.  She denies chest pain.  Family wanted transfer to [facility] but they are on diversion. PAST MEDICAL HISTORY:   Past Medical History:  Diagnosis Date  . Asthma   . CHF (congestive heart failure) ([place])   . Chronic back pain   . COPD (chronic obstructive pulmonary disease) ([place])   . Pulmonary hypertension ([place])     PAST SURGICAL HISTORY:   Past Surgical History:  Procedure Laterality Date  . BACK SURGERY      SOCIAL HISTORY:   Social History   Tobacco Use  . Smoking status: Former Smoker    Packs/day: 1.00    Years: 30.00    Pack years: 30.00    Types: Cigarettes    Quit date: 06/09/1999    Years since quitting: 20.1  . Smokeless tobacco: Never Used  Substance Use Topics  . Alcohol use: No    FAMILY HISTORY:   Family History  Problem Relation Age of Onset  . Hypertension Mother   . Liver cancer Father     DRUG ALLERGIES:    Allergies  Allergen Reactions  . Penicillins Anaphylaxis    BREATHING PROBLEMS  . Erythromycin Nausea And Vomiting and Rash    STOMACH PAIN  . Levofloxacin     Other reaction(s): Other MUSCLE ACHES  . Lidocaine Rash  . Clindamycin     Recurrent C diff  . Levothyroxine Other (See Comments)    Vaginal bleeding Patient used BRAND ONLY SYNTHROID  . Tiotropium Other (See Comments)    Coughing up blood  . Doxycycline Nausea And Vomiting  . Sulfa Antibiotics Rash    REVIEW OF SYSTEMS:   Review of Systems  Constitutional: Positive for malaise/fatigue. Negative for chills and fever.  HENT: Negative.  Negative for ear discharge, ear pain, hearing loss, nosebleeds and sore throat.   Eyes: Negative.  Negative for blurred vision and pain.  Respiratory: Negative.  Negative for cough, hemoptysis, shortness of breath and wheezing.   Cardiovascular: Negative.  Negative for chest pain, palpitations, leg swelling and PND.  Gastrointestinal: Negative.  Negative for abdominal pain, blood in stool, diarrhea, nausea and vomiting.  Genitourinary: Negative.  Negative for dysuria.  Musculoskeletal: Negative.  Negative for back pain.  Skin: Negative.   Neurological: Negative for dizziness, tremors, speech change, focal weakness, seizures and headaches.  Endo/Heme/Allergies: Negative.  Does not bruise/bleed easily.  Psychiatric/Behavioral: Negative.  Negative for depression, hallucinations and suicidal ideas.  MEDICATIONS AT HOME:   Prior to Admission medications   Medication Sig Start Date End Date Taking? Authorizing Provider  albuterol (VENTOLIN HFA) 108 (90 Base) MCG/ACT inhaler Inhale 2 puffs into the lungs every 6 (six) hours as needed for wheezing or shortness of breath.   Yes [provider]  apixaban (ELIQUIS) 2.5 MG TABS tablet Take 2.5 mg by mouth 2 (two) times daily. 07/18/19  Yes [provider]  bisoprolol (ZEBETA) 5 MG tablet Take 5 mg by mouth daily. 10/12/18  10/12/19 Yes [provider]  bumetanide (BUMEX) 1 MG tablet Take 0.5 mg by mouth daily.  10/11/18 10/11/19 Yes [provider]  cyclobenzaprine (FLEXERIL) 10 MG tablet Take 10 mg by mouth every evening. 07/06/19  Yes [provider]  Eszopiclone 3 MG TABS Take 3 mg by mouth at bedtime. 06/18/19 09/16/19 Yes [provider]  famotidine (PEPCID) 20 MG tablet Take 20 mg by mouth 2 (two) times daily. 06/19/18  Yes [provider]  Fluticasone-Salmeterol (ADVAIR) 500-50 MCG/DOSE AEPB Inhale 1 puff into the lungs 2 (two) times daily. 10/11/18  Yes [provider]  gabapentin (NEURONTIN) 300 MG capsule Take 300-600 mg by mouth 3 (three) times daily. 07/18/19  Yes [provider]  ipratropium (ATROVENT HFA) 17 MCG/ACT inhaler INHALE TWO PUFFS BY MOUTH FOUR TIMES A DAY 07/17/18  Yes [provider]  levothyroxine (SYNTHROID) 200 MCG tablet Take 200 mcg by mouth daily. 11/17/18  Yes [provider]  lidocaine (LIDODERM) 5 % Place 1 patch onto the skin every 12 (twelve) hours. 07/19/19 08/18/19 Yes [provider]  montelukast (SINGULAIR) 10 MG tablet Take 10 mg by mouth daily.   Yes [provider]  naloxone (NARCAN) 4 MG/0.1ML LIQD nasal spray kit Place 1 spray into the nose once.   Yes [provider]  Oxycodone HCl 10 MG TABS Take 10 mg by mouth every 4 (four) hours as needed for pain. 07/18/19 07/22/19 Yes [provider]  polyethylene glycol (MIRALAX / GLYCOLAX) 17 g packet Take 17 g by mouth daily as needed for constipation. 07/18/19 08/17/19 Yes [provider]  sacubitril-valsartan (ENTRESTO) 49-51 MG Take 1 tablet by mouth daily. 10/11/18 10/11/19 Yes [provider]  Vitamin D, Cholecalciferol, 25 MCG (1000 UT) TABS Take 2,000 Units by mouth daily.   Yes [provider]  acetaminophen (TYLENOL) 500 MG tablet Take 500-1,000 mg by mouth every 8 (eight) hours as needed. 11/24/18    [provider]  liothyronine (CYTOMEL) 25 MCG tablet Take 25 mcg by mouth daily.    [provider]  lisinopril (PRINIVIL,ZESTRIL) 20 MG tablet Take 20 mg by mouth daily.    [provider]  metoCLOPramide (REGLAN) 10 MG tablet Take 10 mg by mouth daily as needed. 09/19/18   [provider]      VITAL SIGNS:  Blood pressure 104/65, pulse 71, temperature 98.5 F (36.9 C), temperature source Oral, resp. rate 19, height _0  (1.651 m), weight 95.7 kg, SpO2 92 %.  PHYSICAL EXAMINATION:   Physical Exam Constitutional:      General: She is not in acute distress.    Appearance: She is obese.     Comments: Obese  HENT:     Head: Normocephalic.  Eyes:     General: No scleral icterus. Neck:     Musculoskeletal: Normal range of motion and neck supple.     Vascular: No JVD.     Trachea: No tracheal deviation.  Cardiovascular:  Rate and Rhythm: Normal rate and regular rhythm.     Heart sounds: Murmur present. No friction rub. No gallop.   Pulmonary:     Effort: Pulmonary effort is normal. No respiratory distress.     Breath sounds: Normal breath sounds. No wheezing or rales.  Chest:     Chest wall: No tenderness.  Abdominal:     General: Bowel sounds are normal. There is no distension.     Palpations: Abdomen is soft. There is no mass.     Tenderness: There is no abdominal tenderness. There is no guarding or rebound.  Musculoskeletal: Normal range of motion.     Comments: RUE swelling old bruising Left leg big toe amputation and tawny colored  Skin:    General: Skin is warm.     Findings: No erythema or rash.  Neurological:     Mental Status: She is alert and oriented to person, place, and time.  Psychiatric:        Judgment: Judgment normal.       LABORATORY PANEL:   CBC Recent Labs  Lab 07/22/19 0916  WBC 7.4  HGB 8.7*  HCT 29.4*  PLT 326    ------------------------------------------------------------------------------------------------------------------  Chemistries  Recent Labs  Lab 07/22/19 0916  NA 135  K 4.6  CL 100  CO2 24  GLUCOSE 120*  BUN 65*  CREATININE 2.81*  CALCIUM 7.9*  AST 12*  ALT 9  ALKPHOS 73  BILITOT 0.8   ------------------------------------------------------------------------------------------------------------------  Cardiac Enzymes No results for input(s): TROPONINI in the last 168 hours. ------------------------------------------------------------------------------------------------------------------  RADIOLOGY:  Dg Chest Port 1 View  Result Date: 07/22/2019 CLINICAL DATA:  Chest pain and shortness-of-breath. EXAM: PORTABLE CHEST 1 VIEW COMPARISON:  12/04/2018 FINDINGS: Lungs are adequately inflated with minimal prominence of the perihilar markings suggesting mild vascular congestion. No lobar consolidation or effusion. Mild stable cardiomegaly. Stable prominence of the main pulmonary artery segment. Remainder of the exam is unchanged. IMPRESSION: Mild stable cardiomegaly with suggestion of minimal vascular congestion. Electronically Signed   By: Marin Olp M.D.   On: 07/22/2019 09:32    EKG:  NSR no ST elelvation/depression Lateral T wave invesrions   IMPRESSION AND PLAN:    63 year old female with past medical history of pulmonary hypertension, COPD/OSA, chronic pain with morphine pump and recent hospitalization at [facility] for hemorrhagic shock secondary to acute pulse anemia who presented to ER due to hypotension.  1.  Hypovolemic/septic shock with acute kidney injury: Patient has purposely not taking in adequate amounts of fluid which has led to acute kidney injury and profound hypotension.  She has also been  receiving her hypertensive medications at the facility while having decreased oral intake which has decreased her BP significantly. Hemoglobin is stable.  There is no evidence  of hemorrhagic shock.   She has evidence of UTI which could cause septic shock as well. She has anaphylaxis with penicillin. She will need to continue pressors and IV fluids.   Follow-up on urine culture  consult Dr Alva Garnet placed via [place] negative  2.  Acute kidney injury with prerenal azotemia: This is due to poor p.o. intake. I would not be surprised if her creatinine increased tomorrow and she shows sign of ATN. Hold nephrotoxic medications and continue IV fluids BMP in a.m.   3.  History of VTE: Hematology recommends long-term anticoagulation due to the following risk factors: long-term anticoagulation plan, she continues to have ongoing risk factors of 1) Obesity 2) varicose veins with chronic venous insufficiency  3) prior VTE Recommendation is for lower dose apixiban of 2.5 mg bid at this time  4. HFrEF: Most recent echocardiogram at [facility] shows ejection fraction has improved. Due to low blood pressure beta-blocker, Entresto and Bumex are on hold.  5.  Chronic hypoxic respiratory failure with COPD/OSA: No signs of exacerbation  6.  Essential hypertension: Hold all medications due to hypovolemic shock.  7.  Chronic pain with morphine pump  8.  Hypothyroidism: Continue Synthroid/Cytomel   9. PAD with recent stent left leg and admission with hemorrhagic shock: Hemoglobin is stable at this time.  Continue to monitor. She will continue Eliquis.   All the records are reviewed and case discussed with ED provider. Management plans discussed with the patient and she in agreement  CODE STATUS: FULL  CRITICAL CARE TOTAL TIME TAKING CARE OF THIS PATIENT: 60 minutes.    Bettey Costa M.D on 07/22/2019 at 12:26 PM  Between 7am to 6pm - Pager - [phone]  After 6pm go to [email or web address] - password [email or web address] [facility]  Office  [phone]  CC: Primary care physician; Jacklynn Barnacle, MD

## 2019-07-22 NOTE — ED Triage Notes (Signed)
Pt via EMS from [facility], staff went to get vitals this am and BP was 51/38 and O2 sat 88% on 2L. Pt has no complaints and feels normal to her baseline.   EMS vital wer 105/57 after 259mL of fluid and 97% on 6L. CBG 172.

## 2019-07-22 NOTE — ED Notes (Signed)
Patients O2 bumped up to 3L due to O2 sats 88% on 2L

## 2019-07-23 LAB — COMPREHENSIVE METABOLIC PANEL
ALT: 16 U/L (ref 0–44)
AST: 19 U/L (ref 15–41)
Albumin: 3.1 g/dL — ABNORMAL LOW (ref 3.5–5.0)
Alkaline Phosphatase: 101 U/L (ref 38–126)
Anion gap: 8 (ref 5–15)
BUN: 55 mg/dL — ABNORMAL HIGH (ref 8–23)
CO2: 23 mmol/L (ref 22–32)
Calcium: 8.2 mg/dL — ABNORMAL LOW (ref 8.9–10.3)
Chloride: 107 mmol/L (ref 98–111)
Creatinine, Ser: 1.68 mg/dL — ABNORMAL HIGH (ref 0.44–1.00)
GFR calc Af Amer: 37 mL/min — ABNORMAL LOW (ref >60)
GFR calc non Af Amer: 32 mL/min — ABNORMAL LOW (ref >60)
Glucose, Bld: 140 mg/dL — ABNORMAL HIGH (ref 70–99)
Potassium: 5 mmol/L (ref 3.5–5.1)
Sodium: 138 mmol/L (ref 135–145)
Total Bilirubin: 0.9 mg/dL (ref 0.3–1.2)
Total Protein: 6 g/dL — ABNORMAL LOW (ref 6.5–8.1)

## 2019-07-23 LAB — CBC
HCT: 35.3 % — ABNORMAL LOW (ref 36.0–46.0)
Hemoglobin: 10.5 g/dL — ABNORMAL LOW (ref 12.0–15.0)
MCH: 28.6 pg (ref 26.0–34.0)
MCHC: 29.7 g/dL — ABNORMAL LOW (ref 30.0–36.0)
MCV: 96.2 fL (ref 80.0–100.0)
Platelets: 418 10*3/uL — ABNORMAL HIGH (ref 150–400)
RBC: 3.67 MIL/uL — ABNORMAL LOW (ref 3.87–5.11)
RDW: 18 % — ABNORMAL HIGH (ref 11.5–15.5)
WBC: 8.9 10*3/uL (ref 4.0–10.5)
nRBC: 0 % (ref 0.0–0.2)

## 2019-07-23 LAB — PROCALCITONIN: Procalcitonin: 0.22 ng/mL

## 2019-07-23 LAB — PREPARE RBC (CROSSMATCH)

## 2019-07-23 MED ORDER — GABAPENTIN 100 MG PO CAPS
100.0000 mg | ORAL_CAPSULE | Freq: Two times a day (BID) | ORAL | Status: DC
  Administered 2019-07-23 – 2019-08-02 (×22): 100 mg via ORAL
  Filled 2019-07-23 (×22): qty 1

## 2019-07-23 MED ORDER — IPRATROPIUM BROMIDE 0.02 % IN SOLN
2.5000 mL | Freq: Four times a day (QID) | RESPIRATORY_TRACT | Status: DC
  Administered 2019-07-23 – 2019-07-24 (×2): 0.5 mg via RESPIRATORY_TRACT
  Filled 2019-07-23 (×2): qty 2.5

## 2019-07-23 NOTE — TOC Initial Note (Signed)
Transition of Care [facility]) - Initial/Assessment Note    Patient Details  Name: Regina White MRN: [id number] Date of Birth: 1956/01/25  [facility] CM/SW Contact:    Annamaria Boots, [place] Phone Number: 07/23/2019, 4:40 PM  Clinical Narrative:   [facility] team consulted for facility placement. CSW met with patient and husband Herbie Baltimore Bridgett in room. Patient reports that she has been at [facility] since 9/10 for short term rehab. Per patient, she was unable to receive a bed pan in a timely manner at facility and therefore chose not to drink so that she would not have to use the bathroom and now she is dehydrated. Patient reports that prior to last hospitalization that she was independent with a walker and lived with her husband. Per husband, they have had home health in the past and it was not very helpful. Patient and husband would like patient to return to [facility] when medically ready for discharge. CSW will continue to follow for discharge planning.                 Expected Discharge Plan: Skilled Nursing Facility Barriers to Discharge: Continued Medical Work up   Patient Goals and CMS Choice   CMS Medicare.gov Compare Post Acute Care list provided to:: Patient Choice offered to / list presented to : Patient  Expected Discharge Plan and Services Expected Discharge Plan: [place]       Living arrangements for the past 2 months: [place], [place]                                      Prior Living Arrangements/Services Living arrangements for the past 2 months: [place], [place] Lives with:: Spouse Patient language and need for interpreter reviewed:: Yes Do you feel safe going back to the place where you live?: Yes      Need for Family Participation in Patient Care: Yes (Comment) Care giver support system in place?: Yes (comment) Current home services: DME Criminal  Activity/Legal Involvement Pertinent to Current Situation/Hospitalization: No - Comment as needed  Activities of Daily Living Home Assistive Devices/Equipment: None ADL Screening (condition at time of admission) Patient's cognitive ability adequate to safely complete daily activities?: Yes Is the patient deaf or have difficulty hearing?: No Does the patient have difficulty seeing, even when wearing glasses/contacts?: No Does the patient have difficulty concentrating, remembering, or making decisions?: No Patient able to express need for assistance with ADLs?: Yes Does the patient have difficulty dressing or bathing?: Yes Independently performs ADLs?: No Communication: Independent Dressing (OT): Needs assistance Is this a change from baseline?: Pre-admission baseline Grooming: Needs assistance Is this a change from baseline?: Pre-admission baseline Feeding: Needs assistance Is this a change from baseline?: Pre-admission baseline Bathing: Needs assistance Is this a change from baseline?: Pre-admission baseline Toileting: Needs assistance Is this a change from baseline?: Pre-admission baseline In/Out Bed: Needs assistance Is this a change from baseline?: Pre-admission baseline Walks in Home: Needs assistance Is this a change from baseline?: Pre-admission baseline Does the patient have difficulty walking or climbing stairs?: Yes Weakness of Legs: Both Weakness of Arms/Hands: Right  Permission Sought/Granted Permission sought to share information with : Case Manager, Customer service manager, Family Supports Permission granted to share information with : Yes, Verbal Permission Granted  Emotional Assessment Appearance:: Appears stated age Attitude/Demeanor/Rapport: Apprehensive Affect (typically observed): Tearful/Crying, Adaptable Orientation: : Oriented to Self, Oriented to Place, Oriented to  Time, Oriented to Situation Alcohol / Substance Use: Not  Applicable Psych Involvement: No (comment)  Admission diagnosis:  Hypotension, unspecified hypotension type [I95.9] Sepsis, due to unspecified organism, unspecified whether acute organ dysfunction present [facility]) [A41.9] Patient Active Problem List   Diagnosis Date Noted  . Hypovolemic shock ([place]) 07/22/2019  . Acute respiratory failure with hypoxemia ([place]) 12/04/2018  . Acute respiratory failure with hypoxia ([place]) 06/02/2018  . Diarrhea of presumed infectious origin 02/17/2016  . Right acute serous otitis media 02/17/2016  . COPD (chronic obstructive pulmonary disease) ([place]) 12/23/2015  . Heart failure with preserved ejection fraction ([place]) 12/23/2015  . DDD (degenerative disc disease) 09/25/2013  . Narrowing of intervertebral disc space 09/25/2013  . OSA (obstructive sleep apnea) 07/25/2013  . Deep vein thrombosis (DVT) ([place]) 06/25/2013  . Erythema 06/18/2013  . Left leg pain 06/18/2013  . Mediastinal lymphadenopathy 06/18/2013  . DVT, lower extremity ([place]) 06/14/2013  . Pulmonary hypertension ([place]) 06/14/2013  . Asthma 09/21/2010  . Chronic pain 09/21/2010  . Hypertension, benign 09/21/2010  . Hypothyroidism 09/21/2010  . Venous stasis 09/21/2010   PCP:  Jacklynn Barnacle, MD Pharmacy:   [place], [place] - [facility] [address] [place] [address] Phone: [phone] Fax: [place], [place] [address] [place] [address] Phone: [phone] Fax: [phone]     Social Determinants of Health (SDOH) Interventions    Readmission Risk Interventions No flowsheet data found.

## 2019-07-23 NOTE — NC FL2 (Signed)
[place] LEVEL OF CARE SCREENING TOOL     IDENTIFICATION  Patient Name: Regina White Birthdate: 29-Mar-1956 Sex: female Admission Date (Current Location): 07/22/2019  [place] and [place] Number:  Engineering geologist and Address:  [facility], [address], [place], [place] [address]      Provider Number: [id number]  Attending Physician Name and Address:  Vaughan Basta, *  Relative Name and Phone Number:  Azriel Dancy- husband    Current Level of Care: Hospital Recommended Level of Care: [place] Prior Approval Number:    Date Approved/Denied:   PASRR Number: [id number]  Discharge Plan: SNF    Current Diagnoses: Patient Active Problem List   Diagnosis Date Noted  . Hypovolemic shock ([place]) 07/22/2019  . Acute respiratory failure with hypoxemia ([place]) 12/04/2018  . Acute respiratory failure with hypoxia ([place]) 06/02/2018  . Diarrhea of presumed infectious origin 02/17/2016  . Right acute serous otitis media 02/17/2016  . COPD (chronic obstructive pulmonary disease) ([place]) 12/23/2015  . Heart failure with preserved ejection fraction ([place]) 12/23/2015  . DDD (degenerative disc disease) 09/25/2013  . Narrowing of intervertebral disc space 09/25/2013  . OSA (obstructive sleep apnea) 07/25/2013  . Deep vein thrombosis (DVT) ([place]) 06/25/2013  . Erythema 06/18/2013  . Left leg pain 06/18/2013  . Mediastinal lymphadenopathy 06/18/2013  . DVT, lower extremity ([place]) 06/14/2013  . Pulmonary hypertension ([place]) 06/14/2013  . Asthma 09/21/2010  . Chronic pain 09/21/2010  . Hypertension, benign 09/21/2010  . Hypothyroidism 09/21/2010  . Venous stasis 09/21/2010    Orientation RESPIRATION BLADDER Height & Weight     Self, Time, Situation, Place  O2(5 liters) Incontinent Weight: 225 lb 8.5 oz (102.3 kg) Height:  5\' 5"  (165.1 cm)  BEHAVIORAL SYMPTOMS/MOOD NEUROLOGICAL BOWEL NUTRITION STATUS      Continent  Diet(regular)  AMBULATORY STATUS COMMUNICATION OF NEEDS Skin   Extensive Assist Verbally Normal                       Personal Care Assistance Level of Assistance  Bathing, Feeding, Dressing Bathing Assistance: Limited assistance Feeding assistance: Independent Dressing Assistance: Limited assistance     Functional Limitations Info  Sight, Speech, Hearing Sight Info: Adequate Hearing Info: Adequate Speech Info: Adequate    SPECIAL CARE FACTORS FREQUENCY  PT (By licensed PT), OT (By licensed OT)     PT Frequency: 5 OT Frequency: 5            Contractures Contractures Info: Not present    Additional Factors Info  Code Status, Allergies Code Status Info: Full Code Allergies Info: Penicillins, Erythromycin, Levofloxacin, Lidocaine, Clindamycin, Levothyroxine, Tiotropium, Doxycycline, Sulfa Antibiotics           Current Medications (07/23/2019):  This is the current hospital active medication list Current Facility-Administered Medications  Medication Dose Route Frequency Provider Last Rate Last Dose  . 0.9 %  sodium chloride infusion   Intravenous Continuous Bettey Costa, MD 75 mL/hr at 07/23/19 1601    . acetaminophen (TYLENOL) tablet 650 mg  650 mg Oral Q6H PRN Bettey Costa, MD   650 mg at 07/23/19 1030   Or  . acetaminophen (TYLENOL) suppository 650 mg  650 mg Rectal Q6H PRN Mody, Sital, MD      . albuterol (PROVENTIL) (2.5 MG/3ML) 0.083% nebulizer solution 2.5 mg  2.5 mg Nebulization Once Merlyn Lot, MD      . albuterol (PROVENTIL) (2.5 MG/3ML) 0.083% nebulizer solution 2.5 mg  2.5  mg Nebulization Once Willy Eddyobinson, Patrick, MD      . apixaban Everlene Balls(ELIQUIS) tablet 2.5 mg  2.5 mg Oral BID Adrian SaranMody, Sital, MD   2.5 mg at 07/23/19 0929  . cefTRIAXone (ROCEPHIN) 1 g in sodium chloride 0.9 % 100 mL IVPB  1 g Intravenous Q24H Adrian SaranMody, Sital, MD   Stopped at 07/22/19 1759  . Chlorhexidine Gluconate Cloth 2 % PADS 6 each  6 each Topical Daily Merwyn KatosSimonds, David B, MD   6 each at  07/23/19 0430  . fentaNYL (SUBLIMAZE) injection 50 mcg  50 mcg Intravenous Q1H PRN Willy Eddyobinson, Patrick, MD      . gabapentin (NEURONTIN) capsule 100 mg  100 mg Oral BID Tukov-Yual, Magdalene S, NP   100 mg at 07/23/19 0929  . HYDROcodone-acetaminophen (NORCO/VICODIN) 5-325 MG per tablet 1-2 tablet  1-2 tablet Oral Q4H PRN Adrian SaranMody, Sital, MD   2 tablet at 07/23/19 1314  . influenza vac split quadrivalent PF (FLUARIX) injection 0.5 mL  0.5 mL Intramuscular Tomorrow-1000 Merwyn KatosSimonds, David B, MD      . levothyroxine (SYNTHROID) tablet 200 mcg  200 mcg Oral [id number] Adrian SaranMody, Sital, MD   200 mcg at 07/23/19 0629  . liothyronine (CYTOMEL) tablet 25 mcg  25 mcg Oral Daily Adrian SaranMody, Sital, MD   25 mcg at 07/23/19 0929  . MEDLINE mouth rinse  15 mL Mouth Rinse BID Merwyn KatosSimonds, David B, MD   15 mL at 07/22/19 2157  . norepinephrine (LEVOPHED) 16 mg in 250mL premix infusion  0-40 mcg/min Intravenous Continuous Merwyn KatosSimonds, David B, MD 6.56 mL/hr at 07/23/19 1601 7 mcg/min at 07/23/19 1601  . ondansetron (ZOFRAN) tablet 4 mg  4 mg Oral Q6H PRN Mody, Sital, MD       Or  . ondansetron (ZOFRAN) injection 4 mg  4 mg Intravenous Q6H PRN Mody, Sital, MD      . polyethylene glycol (MIRALAX / GLYCOLAX) packet 17 g  17 g Oral Daily PRN Adrian SaranMody, Sital, MD         Discharge Medications: Please see discharge summary for a list of discharge medications.  Relevant Imaging Results:  Relevant Lab Results:   Additional Information SSN: [id number]  Ruthe MannanCandace  Alisha Bacus, [place]

## 2019-07-24 LAB — BASIC METABOLIC PANEL
Anion gap: 7 (ref 5–15)
BUN: 34 mg/dL — ABNORMAL HIGH (ref 8–23)
CO2: 23 mmol/L (ref 22–32)
Calcium: 8.5 mg/dL — ABNORMAL LOW (ref 8.9–10.3)
Chloride: 110 mmol/L (ref 98–111)
Creatinine, Ser: 0.93 mg/dL (ref 0.44–1.00)
GFR calc Af Amer: >60 mL/min (ref >60)
GFR calc non Af Amer: >60 mL/min (ref >60)
Glucose, Bld: 114 mg/dL — ABNORMAL HIGH (ref 70–99)
Potassium: 4.6 mmol/L (ref 3.5–5.1)
Sodium: 140 mmol/L (ref 135–145)

## 2019-07-24 LAB — URINE CULTURE: Culture: >=100000 — AB

## 2019-07-24 LAB — CBC
HCT: 32.4 % — ABNORMAL LOW (ref 36.0–46.0)
Hemoglobin: 9.4 g/dL — ABNORMAL LOW (ref 12.0–15.0)
MCH: 28 pg (ref 26.0–34.0)
MCHC: 29 g/dL — ABNORMAL LOW (ref 30.0–36.0)
MCV: 96.4 fL (ref 80.0–100.0)
Platelets: 303 10*3/uL (ref 150–400)
RBC: 3.36 MIL/uL — ABNORMAL LOW (ref 3.87–5.11)
RDW: 17.9 % — ABNORMAL HIGH (ref 11.5–15.5)
WBC: 5.3 10*3/uL (ref 4.0–10.5)
nRBC: 0 % (ref 0.0–0.2)

## 2019-07-24 LAB — PROCALCITONIN: Procalcitonin: 0.18 ng/mL

## 2019-07-24 MED ORDER — BACID PO TABS
2.0000 | ORAL_TABLET | Freq: Three times a day (TID) | ORAL | Status: DC
  Filled 2019-07-24 (×2): qty 2

## 2019-07-24 MED ORDER — CEFAZOLIN SODIUM-DEXTROSE 1-4 GM/50ML-% IV SOLN
1.0000 g | Freq: Three times a day (TID) | INTRAVENOUS | Status: DC
  Administered 2019-07-24 – 2019-07-26 (×7): 1 g via INTRAVENOUS
  Filled 2019-07-24 (×12): qty 50

## 2019-07-24 MED ORDER — MOMETASONE FURO-FORMOTEROL FUM 200-5 MCG/ACT IN AERO
2.0000 | INHALATION_SPRAY | Freq: Two times a day (BID) | RESPIRATORY_TRACT | Status: DC
  Administered 2019-07-24 – 2019-08-02 (×19): 2 via RESPIRATORY_TRACT
  Filled 2019-07-24: qty 8.8

## 2019-07-24 MED ORDER — RISAQUAD PO CAPS
2.0000 | ORAL_CAPSULE | Freq: Three times a day (TID) | ORAL | Status: DC
  Administered 2019-07-24 – 2019-08-02 (×26): 2 via ORAL
  Filled 2019-07-24 (×31): qty 2

## 2019-07-24 MED ORDER — MIDODRINE HCL 5 MG PO TABS
5.0000 mg | ORAL_TABLET | Freq: Three times a day (TID) | ORAL | Status: DC
  Administered 2019-07-24 – 2019-07-25 (×2): 5 mg via ORAL
  Filled 2019-07-24 (×5): qty 1

## 2019-07-24 MED ORDER — HYDROCORTISONE NA SUCCINATE PF 100 MG IJ SOLR
50.0000 mg | Freq: Four times a day (QID) | INTRAMUSCULAR | Status: DC
  Administered 2019-07-24 – 2019-07-27 (×12): 50 mg via INTRAVENOUS
  Filled 2019-07-24 (×12): qty 2

## 2019-07-24 MED ORDER — FENTANYL CITRATE (PF) 100 MCG/2ML IJ SOLN
25.0000 ug | INTRAMUSCULAR | Status: DC | PRN
  Filled 2019-07-24: qty 2

## 2019-07-24 MED ORDER — LACTATED RINGERS IV SOLN
INTRAVENOUS | Status: DC
  Administered 2019-07-24: 75 mL/h via INTRAVENOUS

## 2019-07-24 NOTE — Progress Notes (Signed)
Family Meeting Note  Advance Directive:yes  Today a meeting took place with the Pt and her husband.   The following clinical team members were present during this meeting:MD  The following were discussed:Patient's diagnosis: DVT, Hypotension, sepsis, Patient's progosis: unable to determine and Goals for treatment: Full code  Additional follow-up to be provided: PCP  Time spent during discussion:16 min.  Vaughan Basta, MD

## 2019-07-24 NOTE — Progress Notes (Signed)
[facility] called for update on patient.  Still awaiting for a bed opening.

## 2019-07-24 NOTE — Progress Notes (Signed)
[place] [facility] [place] NAME: Chrissie Dacquisto    MR#:  [id number]  DATE OF BIRTH:  [birth year]  SUBJECTIVE:  CHIEF COMPLAINT:   Chief Complaint  Patient presents with  . Hypotension   Pt had recent venous thrombosis and then hematoma- recent long stay at [facility] and sent to rehab last week, not much mobile there. Sent to hospital due to hypotension. Was alert and oriented and denies any complains during my visit. Husband ws in room,. They were not happy with all the complications and long stays they had in last few weeks. BP improved, now on very low dose IV pressors when seen in morning.  REVIEW OF SYSTEMS:  CONSTITUTIONAL: No fever,have fatigue or weakness.  EYES: No blurred or double vision.  EARS, NOSE, AND THROAT: No tinnitus or ear pain.  RESPIRATORY: No cough, shortness of breath, wheezing or hemoptysis.  CARDIOVASCULAR: No chest pain, orthopnea, edema.  GASTROINTESTINAL: No nausea, vomiting, diarrhea or abdominal pain.  GENITOURINARY: No dysuria, hematuria.  ENDOCRINE: No polyuria, nocturia,  HEMATOLOGY: No anemia, easy bruising or bleeding SKIN: No rash or lesion. MUSCULOSKELETAL: No joint pain or arthritis.   NEUROLOGIC: No tingling, numbness, weakness.  PSYCHIATRY: No anxiety or depression.   ROS  DRUG ALLERGIES:   Allergies  Allergen Reactions  . Penicillins Anaphylaxis    BREATHING PROBLEMS  Patient tolerated Ceftriaxone September 2020  . Erythromycin Nausea And Vomiting and Rash    STOMACH PAIN  . Levofloxacin     Other reaction(s): Other MUSCLE ACHES  . Lidocaine Rash  . Clindamycin     Recurrent C diff  . Levothyroxine Other (See Comments)    Vaginal bleeding Patient used BRAND ONLY SYNTHROID  . Tiotropium Other (See Comments)    Coughing up blood  . Doxycycline Nausea And Vomiting  . Sulfa Antibiotics Rash    VITALS:  Blood pressure 127/70, pulse 65, temperature (!) 97.5 F (36.4 C), temperature source  Oral, resp. rate (!) 26, height 5\' 5"  (1.651 m), weight 102.3 kg, SpO2 95 %.  PHYSICAL EXAMINATION:  GENERAL:  63 y.o.-year-old patient lying in the bed with no acute distress.  EYES: Pupils equal, round, reactive to light and accommodation. No scleral icterus. Extraocular muscles intact.  HEENT: Head atraumatic, normocephalic. Oropharynx and nasopharynx clear.  NECK:  Supple, no jugular venous distention. No thyroid enlargement, no tenderness.  LUNGS: Normal breath sounds bilaterally, no wheezing, rales,rhonchi or crepitation. No use of accessory muscles of respiration.  CARDIOVASCULAR: S1, S2 normal. No murmurs, rubs, or gallops.  ABDOMEN: Soft, nontender, nondistended. Bowel sounds present. No organomegaly or mass.  EXTREMITIES: No pedal edema, cyanosis, or clubbing.  NEUROLOGIC: Cranial nerves II through XII are intact. Muscle strength 3-4/5 in all extremities. Sensation intact. Gait not checked.  PSYCHIATRIC: The patient is alert and oriented x 3.  SKIN: No obvious rash, lesion, or ulcer.   Physical Exam LABORATORY PANEL:   CBC Recent Labs  Lab 07/24/19 0545  WBC 5.3  HGB 9.4*  HCT 32.4*  PLT 303   ------------------------------------------------------------------------------------------------------------------  Chemistries  Recent Labs  Lab 07/23/19 0523 07/24/19 0545  NA 138 140  K 5.0 4.6  CL 107 110  CO2 23 23  GLUCOSE 140* 114*  BUN 55* 34*  CREATININE 1.68* 0.93  CALCIUM 8.2* 8.5*  AST 19  --   ALT 16  --   ALKPHOS 101  --   BILITOT 0.9  --    ------------------------------------------------------------------------------------------------------------------  Cardiac Enzymes No results  for input(s): TROPONINI in the last 168 hours. ------------------------------------------------------------------------------------------------------------------  RADIOLOGY:  No results found.  ASSESSMENT AND PLAN:   Active Problems:   Hypovolemic shock (HCC)  1.   Hypovolemic/septic shock with acute kidney injury: Patient has purposely not taking in adequate amounts of fluid which has led to acute kidney injury and profound hypotension.  She has also been  receiving her hypertensive medications at the facility while having decreased oral intake which has decreased her BP significantly. Hemoglobin is stable.  There is no evidence of hemorrhagic shock.   COVID negative UTI- ur cx with ecoli- cont Abx. Taper off Pressors.  2.  Acute kidney injury with prerenal azotemia: This is due to poor p.o. intake. Hold nephrotoxic medications and continue IV fluids BMP in a.m. Improving. may restart held diuretics and BP meds now as renal func normal.  3.  History of VTE: Hematology recommends long-term anticoagulation due to the following risk factors: long-term anticoagulation plan, she continues to have ongoing risk factors of 1) Obesity 2) varicose veins with chronic venous insufficiency 3) prior VTE Recommendation is for lower dose apixiban of 2.5 mg bid at this time  4. HFrEF: [facility] shows ejection fraction has improved. Due to low blood pressure beta-blocker, Entresto and Bumex are on hold.  5.  Chronic hypoxic respiratory failure with COPD/OSA: No signs of exacerbation  6.  Essential hypertension: Hold all medications due to hypovolemic shock. Resume now,as renal func and BP improving.  7.  Chronic pain with morphine pump  8.  Hypothyroidism: Continue Synthroid/Cytomel   9. PAD with recent stent left leg and admission with hemorrhagic shock: Hemoglobin is stable at this time.  Continue to monitor. continue Eliquis.    All the records are reviewed and case discussed with Care Management/Social Workerr. Management plans discussed with the patient, family and they are in agreement.  CODE STATUS: Full.  TOTAL TIME TAKING CARE OF THIS PATIENT: 35 minutes.   Discussed with husband in room also.  POSSIBLE D/C IN  1-2 DAYS, DEPENDING ON CLINICAL CONDITION.   Altamese DillingVaibhavkumar Jeannemarie Sawaya M.D on 07/24/2019   Between 7am to 6pm - Pager - [phone]  After 6pm go to [email or web address] - password [email or web address]  [facility]  Office  [phone]  CC: Primary care physician; Cain SieveKlipstein, Christopher, MD  Note: This dictation was prepared with Dragon dictation along with smaller phrase technology. Any transcriptional errors that result from this process are unintentional.

## 2019-07-24 NOTE — Evaluation (Signed)
Physical Therapy Evaluation Patient Details Name: Regina White MRN: [id number] DOB: 1956-07-31 Today's Date: 07/24/2019   History of Present Illness  Pt is a 63 y.o. female presenting to hospital 07/22/19 with hypotension.  Admitted with hypovolemic shock with acute kidney injury, acute kidney injury with pre-renal azotomia, h/o VTE, and HFrEF.  Recent h/o R UE edematous and bruised (hematoma).  Pt with recent admit to [facility] for hemorrhagic shock d/t acute blood loss anemia in setting of supratherapeutic Lovenox after recent vascular stent procedure (pt has been at [facility] since 9/10).  PMH includes active morphine pump L abdomen, asthma, CHF, COPD, pulmonary htn, chronic back pain, h/o back surgery, PAD, recurent c-diff infections.  Clinical Impression  Prior to recent hospital admissions, pt was modified independent ambulating with walker (SW in home; 4ww in community).  Pt lives with her husband in bi-level home with 6 plus 6 steps to enter with railings (can stay on one floor).  Currently pt is 2 assist semi-supine to/from sit and able to sit on edge of bed for about 15 minutes with CGA to close SBA.  Pt appearing very motivated to participate in therapy and reported it felt really good to be able to sit onto edge of bed (pt requesting to sit up as long as she could).  BP 129/70 with MAP 86 beginning of session and BP 115/71 with MAP 83 end of session resting in bed (pt's BP and MAP monitored during session and no significant changes/concerns noted).  R UE noted to be swollen, painful, and limited with AROM d/t pain and weakness (4/10 at beginning of session and 5/10 end of session--pt received pain meds prior to PT session).  L thigh also noted to be painful with L LE movement but pt reported pain improved with LE ex's in bed and ex's sitting edge of bed (overall pt appearing stiff beginning of session but pt reported feeling better after performing LE ex's).  Pt would benefit from skilled  PT to address noted impairments and functional limitations (see below for any additional details).  Upon hospital discharge, recommend pt discharge to [facility].    Follow Up Recommendations SNF    Equipment Recommendations  Rolling walker with 5" wheels;3in1 (PT);Wheelchair (measurements PT);Wheelchair cushion (measurements PT);Hospital bed(hoyer lift)    Recommendations for Other Services OT consult     Precautions / Restrictions Precautions Precautions: Fall Precaution Comments: Has been wearing sling on R UE for OOB mobility (sling not at hospital though); monitor BP; MAP >65 Restrictions Weight Bearing Restrictions: No Other Position/Activity Restrictions: Discussed bedrest with bathroom privileges order with MD Anselm Jungling 07/24/19 who cleared pt for OOB mobility      Mobility  Bed Mobility Overal bed mobility: Needs Assistance Bed Mobility: Supine to Sit;Sit to Supine     Supine to sit: Mod assist;+2 for physical assistance Sit to supine: Mod assist;+2 for physical assistance   General bed mobility comments: pt able to assist some with R>L LE but overall needing assist for trunk and B LE's semi-supine to/from sit  Transfers                 General transfer comment: Deferred d/t pt overall weakness  Ambulation/Gait                Stairs            Wheelchair Mobility    Modified Rankin (Stroke Patients Only)       Balance Overall balance assessment: Needs assistance Sitting-balance support:  No upper extremity supported;Feet unsupported Sitting balance-Leahy Scale: Good Sitting balance - Comments: steady static sitting reaching within BOS but with fatigue pt noted with increased posterior lean requiring L UE support                                     Pertinent Vitals/Pain Pain Assessment: 0-10 Pain Score: 5  Pain Location: R UE Pain Descriptors / Indicators: Constant;Throbbing;Burning;Tingling Pain Intervention(s): Limited  activity within patient's tolerance;Monitored during session;Premedicated before session;Repositioned;Other (comment)(R UE elevated on 2 pillows end of session)  HR and O2 sats (on supplemental O2 via [place]) stable and WFL during session's activities.    Home Living Family/patient expects to be discharged to:: Skilled nursing facility Living Arrangements: Spouse/significant other Available Help at Discharge: Family Type of Home: House Home Access: Stairs to enter   [employer] CorporationEntrance Stairs-Number of Steps: Bi-level: 6 steps with B railings (not able to reach both) plus 6 steps with B railings (able to reach both) to main level Home Layout: (Bi-level--able to live on one level) Home Equipment: Walker - standard;Walker - 4 wheels;Shower seat;Grab bars - toilet;Grab bars - tub/shower      Prior Function Level of Independence: Independent with assistive device(s)         Comments: Ambulatory with SW in house and rollator outside home prior to recent hospitalizations (was recently working on standing in rehab and was about to progress to taking steps).  Has been at rehab [facility] since 9/10 and then [facility].     Hand Dominance        Extremity/Trunk Assessment   Upper Extremity Assessment Upper Extremity Assessment: RUE deficits/detail;LUE deficits/detail RUE Deficits / Details: increased difficulty with finger flexion (increased swelling noted to all fingers); fair to poor R hand grip strength; at least 3/5 AROM elbow flexion/extension; R shoulder flexion 2+/5 RUE: Unable to fully assess due to pain LUE Deficits / Details: good L hand grip strength; at least 3/5 elbow flexion/extension and shoulder flexion AROM    Lower Extremity Assessment Lower Extremity Assessment: RLE deficits/detail;LLE deficits/detail RLE Deficits / Details: hip flexion 3-/5; knee flexion/extension at least 3/5 AROM; DF at least 3/5 AROM LLE Deficits / Details: hip flexion 2+/5; knee  flexion/extension 2+/5; DF 3/5 LLE: Unable to fully assess due to pain    Cervical / Trunk Assessment Cervical / Trunk Assessment: (forward head/shoulders)  Communication   Communication: No difficulties  Cognition Arousal/Alertness: Awake/alert Behavior During Therapy: WFL for tasks assessed/performed Overall Cognitive Status: Within Functional Limits for tasks assessed                                        General Comments   Nursing cleared pt for participation in physical therapy (but to monitor BP and MAP).  Pt agreeable to PT session.  Pt's husband present during session's activities.    Exercises Total Joint Exercises Ankle Circles/Pumps: AROM;Strengthening;Both;10 reps;Supine(also in sitting 10 reps x3 sets B ankle pumps) Quad Sets: AROM;Strengthening;Both;10 reps;Supine Short Arc Quad: AAROM;Strengthening;Both;10 reps;Supine Heel Slides: AAROM;Strengthening;Both;10 reps;Supine Hip ABduction/ADduction: AAROM;Strengthening;Both;10 reps;Supine Long Arc Quad: AROM;Strengthening;Both;Seated(10 reps x2 B LE's)   Assessment/Plan    PT Assessment Patient needs continued PT services  PT Problem List Decreased strength;Decreased range of motion;Decreased activity tolerance;Decreased balance;Decreased mobility;Decreased knowledge of use of DME;Cardiopulmonary status limiting activity;Pain  PT Treatment Interventions DME instruction;Gait training;Functional mobility training;Therapeutic activities;Therapeutic exercise;Balance training;Patient/family education    PT Goals (Current goals can be found in the Care Plan section)  Acute Rehab PT Goals Patient Stated Goal: to be able to walk again PT Goal Formulation: With patient Time For Goal Achievement: 08/07/19 Potential to Achieve Goals: Fair    Frequency Min 2X/week   Barriers to discharge Decreased caregiver support      Co-evaluation               AM-PAC PT "6 Clicks" Mobility  Outcome  Measure Help needed turning from your back to your side while in a flat bed without using bedrails?: A Lot Help needed moving from lying on your back to sitting on the side of a flat bed without using bedrails?: Total Help needed moving to and from a bed to a chair (including a wheelchair)?: Total Help needed standing up from a chair using your arms (e.g., wheelchair or bedside chair)?: Total Help needed to walk in hospital room?: Total Help needed climbing 3-5 steps with a railing? : Total 6 Click Score: 7    End of Session Equipment Utilized During Treatment: Oxygen Activity Tolerance: Patient tolerated treatment well Patient left: in bed;with call bell/phone within reach;with bed alarm set;with family/visitor present;Other (comment)(B heels floating via pillows; R UE supported by 2 pillows) Nurse Communication: Mobility status;Precautions;Other (comment)(vitals (pt's BP and MAP)) PT Visit Diagnosis: Other abnormalities of gait and mobility (R26.89);Muscle weakness (generalized) (M62.81);Difficulty in walking, not elsewhere classified (R26.2);Pain Pain - Right/Left: Right Pain - part of body: Arm    Time: 9470-9628 PT Time Calculation (min) (ACUTE ONLY): 49 min   Charges:   PT Evaluation $PT Eval Low Complexity: 1 Low PT Treatments $Therapeutic Exercise: 23-37 mins       Hendricks Limes, PT 07/24/19, 3:08 PM [phone]

## 2019-07-24 NOTE — Progress Notes (Signed)
[place] at [place] NAME: Regina White    MR#:  [id number]  DATE OF BIRTH:  [birth year]  SUBJECTIVE:  CHIEF COMPLAINT:   Chief Complaint  Patient presents with  . Hypotension   Pt had recent venous thrombosis and then hematoma- recent long stay at [facility] and sent to rehab last week, not much mobile there. Sent to hospital due to hypotension. Was alert and oriented and denies any complains during my visit. Husband ws in room,. They were not happy with all the complications and long stays they had in last few weeks.  REVIEW OF SYSTEMS:  CONSTITUTIONAL: No fever,have fatigue or weakness.  EYES: No blurred or double vision.  EARS, NOSE, AND THROAT: No tinnitus or ear pain.  RESPIRATORY: No cough, shortness of breath, wheezing or hemoptysis.  CARDIOVASCULAR: No chest pain, orthopnea, edema.  GASTROINTESTINAL: No nausea, vomiting, diarrhea or abdominal pain.  GENITOURINARY: No dysuria, hematuria.  ENDOCRINE: No polyuria, nocturia,  HEMATOLOGY: No anemia, easy bruising or bleeding SKIN: No rash or lesion. MUSCULOSKELETAL: No joint pain or arthritis.   NEUROLOGIC: No tingling, numbness, weakness.  PSYCHIATRY: No anxiety or depression.   ROS  DRUG ALLERGIES:   Allergies  Allergen Reactions  . Penicillins Anaphylaxis    BREATHING PROBLEMS  . Erythromycin Nausea And Vomiting and Rash    STOMACH PAIN  . Levofloxacin     Other reaction(s): Other MUSCLE ACHES  . Lidocaine Rash  . Clindamycin     Recurrent C diff  . Levothyroxine Other (See Comments)    Vaginal bleeding Patient used BRAND ONLY SYNTHROID  . Tiotropium Other (See Comments)    Coughing up blood  . Doxycycline Nausea And Vomiting  . Sulfa Antibiotics Rash    VITALS:  Blood pressure (!) 113/51, pulse (!) 30, temperature 98.9 F (37.2 C), temperature source Oral, resp. rate 19, height 5\' 5"  (1.651 m), weight 102.3 kg, SpO2 93 %.  PHYSICAL EXAMINATION:  GENERAL:  63  y.o.-year-old patient lying in the bed with no acute distress.  EYES: Pupils equal, round, reactive to light and accommodation. No scleral icterus. Extraocular muscles intact.  HEENT: Head atraumatic, normocephalic. Oropharynx and nasopharynx clear.  NECK:  Supple, no jugular venous distention. No thyroid enlargement, no tenderness.  LUNGS: Normal breath sounds bilaterally, no wheezing, rales,rhonchi or crepitation. No use of accessory muscles of respiration.  CARDIOVASCULAR: S1, S2 normal. No murmurs, rubs, or gallops.  ABDOMEN: Soft, nontender, nondistended. Bowel sounds present. No organomegaly or mass.  EXTREMITIES: No pedal edema, cyanosis, or clubbing.  NEUROLOGIC: Cranial nerves II through XII are intact. Muscle strength 3-4/5 in all extremities. Sensation intact. Gait not checked.  PSYCHIATRIC: The patient is alert and oriented x 3.  SKIN: No obvious rash, lesion, or ulcer.   Physical Exam LABORATORY PANEL:   CBC Recent Labs  Lab 07/24/19 0545  WBC 5.3  HGB 9.4*  HCT 32.4*  PLT 303   ------------------------------------------------------------------------------------------------------------------  Chemistries  Recent Labs  Lab 07/23/19 0523 07/24/19 0545  NA 138 140  K 5.0 4.6  CL 107 110  CO2 23 23  GLUCOSE 140* 114*  BUN 55* 34*  CREATININE 1.68* 0.93  CALCIUM 8.2* 8.5*  AST 19  --   ALT 16  --   ALKPHOS 101  --   BILITOT 0.9  --    ------------------------------------------------------------------------------------------------------------------  Cardiac Enzymes No results for input(s): TROPONINI in the last 168 hours. ------------------------------------------------------------------------------------------------------------------  RADIOLOGY:  Dg Chest Portable 1 View  Result  Date: 07/22/2019 CLINICAL DATA:  Evaluate central line placement. EXAM: PORTABLE CHEST 1 VIEW COMPARISON:  07/22/2019 and prior radiographs FINDINGS: A LEFT IJ central venous  catheter is now noted with tip overlying the mid SVC. Bilateral interstitial opacities are unchanged. Cardiomegaly again noted. No pleural effusion or pneumothorax. IMPRESSION: LEFT IJ central venous catheter with tip overlying the mid SVC. No pneumothorax. Electronically Signed   By: Harmon PierJeffrey  Hu M.D.   On: 07/22/2019 12:50   Dg Chest Port 1 View  Result Date: 07/22/2019 CLINICAL DATA:  Chest pain and shortness-of-breath. EXAM: PORTABLE CHEST 1 VIEW COMPARISON:  12/04/2018 FINDINGS: Lungs are adequately inflated with minimal prominence of the perihilar markings suggesting mild vascular congestion. No lobar consolidation or effusion. Mild stable cardiomegaly. Stable prominence of the main pulmonary artery segment. Remainder of the exam is unchanged. IMPRESSION: Mild stable cardiomegaly with suggestion of minimal vascular congestion. Electronically Signed   By: Elberta Fortisaniel  Boyle M.D.   On: 07/22/2019 09:32    ASSESSMENT AND PLAN:   Active Problems:   Hypovolemic shock (HCC)  1.  Hypovolemic/septic shock with acute kidney injury: Patient has purposely not taking in adequate amounts of fluid which has led to acute kidney injury and profound hypotension.  She has also been  receiving her hypertensive medications at the facility while having decreased oral intake which has decreased her BP significantly. Hemoglobin is stable.  There is no evidence of hemorrhagic shock.   She has evidence of UTI which could cause septic shock as well. She has anaphylaxis with penicillin. She will need to continue pressors and IV fluids.   Follow-up on urine culture  BP much stable now. COVID negative  2.  Acute kidney injury with prerenal azotemia: This is due to poor p.o. intake. Hold nephrotoxic medications and continue IV fluids BMP in a.m. Improving.   3.  History of VTE: Hematology recommends long-term anticoagulation due to the following risk factors: long-term anticoagulation plan, she continues to have  ongoing risk factors of 1) Obesity 2) varicose veins with chronic venous insufficiency 3) prior VTE Recommendation is for lower dose apixiban of 2.5 mg bid at this time  4. HFrEF: [facility] shows ejection fraction has improved. Due to low blood pressure beta-blocker, Entresto and Bumex are on hold.  5.  Chronic hypoxic respiratory failure with COPD/OSA: No signs of exacerbation  6.  Essential hypertension: Hold all medications due to hypovolemic shock.  7.  Chronic pain with morphine pump  8.  Hypothyroidism: Continue Synthroid/Cytomel   9. PAD with recent stent left leg and admission with hemorrhagic shock: Hemoglobin is stable at this time.  Continue to monitor. continue Eliquis.    All the records are reviewed and case discussed with Care Management/Social Workerr. Management plans discussed with the patient, family and they are in agreement.  CODE STATUS: Full.  TOTAL TIME TAKING CARE OF THIS PATIENT: 35 minutes.   Discussed with husband in room also.  POSSIBLE D/C IN 2-3 DAYS, DEPENDING ON CLINICAL CONDITION.   Altamese DillingVaibhavkumar Frandy Basnett M.D on 07/24/2019   Between 7am to 6pm - Pager - [phone]  After 6pm go to [email or web address] - password [email or web address]  [facility] [place] [facility]  Office  [phone]  CC: Primary care physician; Cain SieveKlipstein, Christopher, MD  Note: This dictation was prepared with Dragon dictation along with smaller phrase technology. Any transcriptional errors that result from this process are unintentional.

## 2019-07-25 LAB — BASIC METABOLIC PANEL
Anion gap: 6 (ref 5–15)
BUN: 28 mg/dL — ABNORMAL HIGH (ref 8–23)
CO2: 23 mmol/L (ref 22–32)
Calcium: 8.5 mg/dL — ABNORMAL LOW (ref 8.9–10.3)
Chloride: 110 mmol/L (ref 98–111)
Creatinine, Ser: 0.88 mg/dL (ref 0.44–1.00)
GFR calc Af Amer: >60 mL/min (ref >60)
GFR calc non Af Amer: >60 mL/min (ref >60)
Glucose, Bld: 144 mg/dL — ABNORMAL HIGH (ref 70–99)
Potassium: 4.8 mmol/L (ref 3.5–5.1)
Sodium: 139 mmol/L (ref 135–145)

## 2019-07-25 LAB — CBC
HCT: 32.8 % — ABNORMAL LOW (ref 36.0–46.0)
Hemoglobin: 9.8 g/dL — ABNORMAL LOW (ref 12.0–15.0)
MCH: 28.4 pg (ref 26.0–34.0)
MCHC: 29.9 g/dL — ABNORMAL LOW (ref 30.0–36.0)
MCV: 95.1 fL (ref 80.0–100.0)
Platelets: 328 10*3/uL (ref 150–400)
RBC: 3.45 MIL/uL — ABNORMAL LOW (ref 3.87–5.11)
RDW: 17.8 % — ABNORMAL HIGH (ref 11.5–15.5)
WBC: 8.2 10*3/uL (ref 4.0–10.5)
nRBC: 0 % (ref 0.0–0.2)

## 2019-07-25 LAB — PROCALCITONIN: Procalcitonin: 0.12 ng/mL

## 2019-07-25 MED ORDER — MIDODRINE HCL 5 MG PO TABS
10.0000 mg | ORAL_TABLET | Freq: Three times a day (TID) | ORAL | Status: DC
  Administered 2019-07-25 – 2019-08-02 (×25): 10 mg via ORAL
  Filled 2019-07-25 (×24): qty 2

## 2019-07-25 NOTE — Progress Notes (Signed)
[employer] - [place] [facility]   PATIENT NAME: Regina CharityCathleen Harwick    MR#:  [id number]  DATE OF BIRTH:  29-Sep-1956  SUBJECTIVE:  CHIEF COMPLAINT:   Chief Complaint  Patient presents with  . Hypotension   Pt had recent venous thrombosis and then hematoma- recent long stay at [facility] and sent to rehab last week, not much mobile there. Sent to hospital due to hypotension. Was alert and oriented and denies any complains during my visit. Husband ws in room,. They were not happy with all the complications and long stays they had in last few weeks. BP improved, remains on low dose IV pressors - husband at bedside questioned why entresto hasn't been restarted and explained the reasoning   REVIEW OF SYSTEMS:  CONSTITUTIONAL: No fever,have fatigue or weakness.  EYES: No blurred or double vision.  EARS, NOSE, AND THROAT: No tinnitus or ear pain.  RESPIRATORY: No cough, shortness of breath, wheezing or hemoptysis.  CARDIOVASCULAR: No chest pain, orthopnea, edema.  GASTROINTESTINAL: No nausea, vomiting, diarrhea or abdominal pain.  GENITOURINARY: No dysuria, hematuria.  ENDOCRINE: No polyuria, nocturia,  HEMATOLOGY: No anemia, easy bruising or bleeding SKIN: No rash or lesion. MUSCULOSKELETAL: No joint pain or arthritis.   NEUROLOGIC: No tingling, numbness, weakness.  PSYCHIATRY: No anxiety or depression.   ROS  DRUG ALLERGIES:   Allergies  Allergen Reactions  . Penicillins Anaphylaxis    BREATHING PROBLEMS  Patient tolerated Ceftriaxone September 2020  . Erythromycin Nausea And Vomiting and Rash    STOMACH PAIN  . Levofloxacin     Other reaction(s): Other MUSCLE ACHES  . Lidocaine Rash  . Clindamycin     Recurrent C diff  . Levothyroxine Other (See Comments)    Vaginal bleeding Patient used BRAND ONLY SYNTHROID  . Tiotropium Other (See Comments)    Coughing up blood  . Doxycycline Nausea And Vomiting  . Sulfa Antibiotics Rash    VITALS:  Blood pressure  132/75, pulse 63, temperature (!) 97.4 F (36.3 C), temperature source Oral, resp. rate 20, height 5\' 5"  (1.651 m), weight 102.3 kg, SpO2 94 %.  PHYSICAL EXAMINATION:  GENERAL:  63 y.o.-year-old patient lying in the bed with no acute distress.  EYES: Pupils equal, round, reactive to light and accommodation. No scleral icterus. Extraocular muscles intact.  HEENT: Head atraumatic, normocephalic. Oropharynx and nasopharynx clear.  NECK:  Supple, no jugular venous distention. No thyroid enlargement, no tenderness.  LUNGS: Normal breath sounds bilaterally, no wheezing, rales,rhonchi or crepitation. No use of accessory muscles of respiration.  CARDIOVASCULAR: S1, S2 normal. No murmurs, rubs, or gallops.  ABDOMEN: Soft, nontender, nondistended. Bowel sounds present. No organomegaly or mass.  EXTREMITIES: No pedal edema, cyanosis, or clubbing.  NEUROLOGIC: Cranial nerves II through XII are intact. Muscle strength 3-4/5 in all extremities. Sensation intact. Gait not checked.  PSYCHIATRIC: The patient is alert and oriented x 3.  SKIN: No obvious rash, lesion, or ulcer.   Physical Exam LABORATORY PANEL:   CBC Recent Labs  Lab 07/25/19 0459  WBC 8.2  HGB 9.8*  HCT 32.8*  PLT 328   ------------------------------------------------------------------------------------------------------------------  Chemistries  Recent Labs  Lab 07/23/19 0523  07/25/19 0459  NA 138   < > 139  K 5.0   < > 4.8  CL 107   < > 110  CO2 23   < > 23  GLUCOSE 140*   < > 144*  BUN 55*   < > 28*  CREATININE 1.68*   < >  0.88  CALCIUM 8.2*   < > 8.5*  AST 19  --   --   ALT 16  --   --   ALKPHOS 101  --   --   BILITOT 0.9  --   --    < > = values in this interval not displayed.   ------------------------------------------------------------------------------------------------------------------  Cardiac Enzymes No results for input(s): TROPONINI in the last 168  hours. ------------------------------------------------------------------------------------------------------------------  RADIOLOGY:  No results found.  ASSESSMENT AND PLAN:   Active Problems:   Hypovolemic shock (HCC)  1.  Hypovolemic/septic shock with acute kidney injury: Patient has purposely not taking in adequate amounts of fluid which has led to acute kidney injury and profound hypotension.  She has also been  receiving her hypertensive medications at the facility while having decreased oral intake which has decreased her BP significantly. Hemoglobin is stable.  There is no evidence of hemorrhagic shock.   COVID negative UTI- ur cx with ecoli- cont Abx. Taper off Pressors.  2.  Acute kidney injury with prerenal azotemia: This is due to poor p.o. intake. Hold nephrotoxic medications and continue IV fluids may restart held diuretics and BP meds now as renal func normal.  3.  History of VTE: Hematology recommends long-term anticoagulation due to the following risk factors: long-term anticoagulation plan, she continues to have ongoing risk factors of 1) Obesity 2) varicose veins with chronic venous insufficiency 3) prior VTE Recommendation is for lower dose apixiban of 2.5 mg bid at this time  4. HFrEF: Most recent echocardiogram at [facility] shows ejection fraction has improved. Due to low blood pressure beta-blocker, Entresto and Bumex are on hold.  5.  Chronic hypoxic respiratory failure with COPD/OSA: No signs of exacerbation  6.  Essential hypertension: Hold all medications due to hypovolemic shock. Resume now,as renal func and BP improving.  7.  Chronic pain with morphine pump  8.  Hypothyroidism: Continue Synthroid/Cytomel   9. PAD with recent stent left leg and admission with hemorrhagic shock: Hemoglobin is stable at this time.  Continue to monitor. continue Eliquis.    All the records are reviewed and case discussed with Care Management/Social  Workerr. Management plans discussed with the patient, family (husband at bedside) and they are in agreement.  CODE STATUS: Full.  TOTAL TIME TAKING CARE OF THIS PATIENT: 15 minutes.   Discussed with husband in room also.  POSSIBLE D/C IN 1-2 DAYS, DEPENDING ON CLINICAL CONDITION.   Max Sane M.D on 07/25/2019   Between 7am to 6pm - Pager - [phone]  After 6pm go to [email or web address] - password [email or web address] [facility]  Office  [phone]  CC: Primary care physician; Jacklynn Barnacle, MD  Note: This dictation was prepared with Dragon dictation along with smaller phrase technology. Any transcriptional errors that result from this process are unintentional.

## 2019-07-25 NOTE — Evaluation (Signed)
Occupational Therapy Evaluation Patient Details Name: Regina White MRN: [id number] DOB: 1956/08/01 Today's Date: 07/25/2019    History of Present Illness Pt is a 63 y.o. female presenting to hospital 07/22/19 with hypotension.  Admitted with hypovolemic shock with acute kidney injury, acute kidney injury with pre-renal azotomia, h/o VTE, and HFrEF.  Recent h/o R UE edematous and bruised (hematoma).  Pt with recent admit to [facility] for hemorrhagic shock d/t acute blood loss anemia in setting of supratherapeutic Lovenox after recent vascular stent procedure (pt has been at [facility] since 9/10).  PMH includes active morphine pump L abdomen, asthma, CHF, COPD, pulmonary htn, chronic back pain, h/o back surgery, PAD, recurent c-diff infections.   Clinical Impression   Pt seen for OT evaluation this date. RN cleared OT to see pt but requested no functional mobility or OOB attempts at this time. Prior to recent hospital admissions, pt was independent and active, enjoyed going on cruises with her husband. Has had recent hospitalizations and complications. Pt expresses frustration and is upset regarding all recent health issues. Emotional support and active listening provided. Pt very appreciative. Currently pt demonstrates impairments in RUE functional use with swelling, bruising, and pain with touch and AROM. Pt instructed in gentle SROM ex for RUE to minimize stiffness. Pt able to return demo. Pt/spouse instructed in optimal positioning as well with pillows. Pt instructed in modifications for UB Dressing tasks to minimize pain to RUE. Pt demonstrates impairments in strength globally as well resulting in need for increased assist for bathing, dressing, toileting, and grooming tasks in addition to all attempts at functional mobility. Pt would benefit from skilled OT to address noted impairments and functional limitations (see below for any additional details) in order to maximize safety and independence  while minimizing falls risk and caregiver burden.  Upon hospital discharge, recommend pt discharge to SNF. Will consider changing recommendation to CIR pending pt's progress and further assessment of mobility/ADL.    Follow Up Recommendations  SNF(consider switch to CIR pending further assessment/progress)    Equipment Recommendations  3 in 1 bedside commode    Recommendations for Other Services       Precautions / Restrictions Precautions Precautions: Fall Precaution Comments: Has been wearing sling on R UE for OOB mobility (sling not at hospital though); monitor BP; MAP >65; L abdominal pump Restrictions Weight Bearing Restrictions: No Other Position/Activity Restrictions: Discussed bedrest with bathroom privileges order with MD Elisabeth Pigeon 07/24/19 who cleared pt for OOB mobility (RN requested no bed mobility//OOB 07/25/19)      Mobility Bed Mobility               General bed mobility comments: deferred per RN  Transfers                 General transfer comment: deferred per RN    Balance                                           ADL either performed or assessed with clinical judgement   ADL                                         General ADL Comments: Mod-Max A for bathing, dressing, toileting at bed level     Vision Baseline Vision/History:  Wears glasses Wears Glasses: At all times Patient Visual Report: No change from baseline       Perception     Praxis      Pertinent Vitals/Pain Pain Assessment: 0-10 Pain Score: 7  Pain Location: R UE Pain Descriptors / Indicators: Constant;Throbbing;Burning;Tingling Pain Intervention(s): Limited activity within patient's tolerance;Monitored during session;Repositioned     Hand Dominance     Extremity/Trunk Assessment Upper Extremity Assessment Upper Extremity Assessment: Generalized weakness;RUE deficits/detail;LUE deficits/detail RUE Deficits / Details: increased  difficulty with finger flexion (increased swelling noted to all fingers); fair to poor R hand grip strength; at least 3/5 AROM elbow flexion/extension; R shoulder flexion 2+/5 RUE: Unable to fully assess due to pain LUE Deficits / Details: good L hand grip strength; at least 3/5 elbow flexion/extension and shoulder flexion AROM   Lower Extremity Assessment Lower Extremity Assessment: Defer to PT evaluation;RLE deficits/detail;LLE deficits/detail RLE Deficits / Details: hip flexion 3-/5; knee flexion/extension at least 3/5 AROM; DF at least 3/5 AROM LLE Deficits / Details: hip flexion 2+/5; knee flexion/extension 2+/5; DF 3/5 LLE: Unable to fully assess due to pain       Communication Communication Communication: No difficulties   Cognition Arousal/Alertness: Awake/alert Behavior During Therapy: WFL for tasks assessed/performed Overall Cognitive Status: Within Functional Limits for tasks assessed                                 General Comments: motivated to participate, tearful and frustrated with situation   General Comments       Exercises Other Exercises Other Exercises: Pt/spouse instructed in RUE positioning and gentle SROM to minimize joint stiffness Other Exercises: Pt/spouse instructed in pursed lip breathing for pain mgt and supporting breath recovery when upset   Shoulder Instructions      Home Living Family/patient expects to be discharged to:: Skilled nursing facility Living Arrangements: Spouse/significant other Available Help at Discharge: Family Type of Home: House Home Access: Stairs to enter Technical brewer of Steps: Bi-level: 6 steps with B railings (not able to reach both) plus 6 steps with B railings (able to reach both) to main level   Home Layout: (Bi-level--able to live on one level)     Bathroom Shower/Tub: Hospital doctor Toilet: Handicapped height     Home Equipment: Nurse, children's - 4 wheels;Shower  seat;Grab bars - toilet;Grab bars - tub/shower          Prior Functioning/Environment Level of Independence: Independent with assistive device(s)        Comments: Ambulatory with SW in house and rollator outside home prior to recent hospitalizations (was recently working on standing in rehab and was about to progress to taking steps).  Has been at rehab [facility]) since 9/10 and then came to [facility] on 9/13.        OT Problem List: Decreased strength;Pain;Cardiopulmonary status limiting activity;Impaired UE functional use;Impaired balance (sitting and/or standing);Decreased knowledge of use of DME or AE;Decreased range of motion      OT Treatment/Interventions: Self-care/ADL training;Therapeutic exercise;Therapeutic activities;DME and/or AE instruction;Patient/family education;Balance training    OT Goals(Current goals can be found in the care plan section) Acute Rehab OT Goals Patient Stated Goal: to do as much as she can to get better, be able to walk again OT Goal Formulation: With patient/family Time For Goal Achievement: 08/08/19 Potential to Achieve Goals: Good ADL Goals Pt Will Perform Upper Body Dressing: with mod assist;with min  assist;sitting Pt Will Perform Lower Body Dressing: with mod assist;sitting/lateral leans Pt Will Transfer to Toilet: with min assist;bedside commode;with +2 assist Pt Will Perform Toileting - Clothing Manipulation and hygiene: with min assist;sit to/from stand Additional ADL Goal #1: Pt will demo independence with RUE therex program to improve AROM, strength, and pain mgt.  OT Frequency: Min 2X/week   Barriers to D/C:            Co-evaluation              AM-PAC OT "6 Clicks" Daily Activity     Outcome Measure Help from another person eating meals?: None Help from another person taking care of personal grooming?: A Little Help from another person toileting, which includes using toliet, bedpan, or urinal?: A Lot Help from another  person bathing (including washing, rinsing, drying)?: A Lot Help from another person to put on and taking off regular upper body clothing?: A Lot Help from another person to put on and taking off regular lower body clothing?: A Lot 6 Click Score: 15   End of Session    Activity Tolerance: Patient tolerated treatment well(limited mobility/ADL per RN) Patient left: in bed;with call bell/phone within reach;with bed alarm set;with family/visitor present  OT Visit Diagnosis: Other abnormalities of gait and mobility (R26.89);Muscle weakness (generalized) (M62.81);Pain Pain - Right/Left: Right Pain - part of body: Arm                Time: [id number] OT Time Calculation (min): 28 min Charges:  OT General Charges $OT Visit: 1 Visit OT Evaluation $OT Eval Moderate Complexity: 1 Mod OT Treatments $Therapeutic Activity: 8-22 mins  Richrd PrimeJamie Stiller, MPH, MS, OTR/L ascom [phone] 07/25/19, 5:00 PM

## 2019-07-26 LAB — CBC WITH DIFFERENTIAL/PLATELET
Abs Immature Granulocytes: 0.03 10*3/uL (ref 0.00–0.07)
Basophils Absolute: 0.1 10*3/uL (ref 0.0–0.1)
Basophils Relative: 1 %
Eosinophils Absolute: 0 10*3/uL (ref 0.0–0.5)
Eosinophils Relative: 0 %
HCT: 34.9 % — ABNORMAL LOW (ref 36.0–46.0)
Hemoglobin: 10.2 g/dL — ABNORMAL LOW (ref 12.0–15.0)
Immature Granulocytes: 0 %
Lymphocytes Relative: 7 %
Lymphs Abs: 0.6 10*3/uL — ABNORMAL LOW (ref 0.7–4.0)
MCH: 27.9 pg (ref 26.0–34.0)
MCHC: 29.2 g/dL — ABNORMAL LOW (ref 30.0–36.0)
MCV: 95.6 fL (ref 80.0–100.0)
Monocytes Absolute: 0.1 10*3/uL (ref 0.1–1.0)
Monocytes Relative: 2 %
Neutro Abs: 7.4 10*3/uL (ref 1.7–7.7)
Neutrophils Relative %: 90 %
Platelets: 388 10*3/uL (ref 150–400)
RBC: 3.65 MIL/uL — ABNORMAL LOW (ref 3.87–5.11)
RDW: 17.7 % — ABNORMAL HIGH (ref 11.5–15.5)
WBC: 8.1 10*3/uL (ref 4.0–10.5)
nRBC: 0 % (ref 0.0–0.2)

## 2019-07-26 LAB — BASIC METABOLIC PANEL
Anion gap: 9 (ref 5–15)
BUN: 32 mg/dL — ABNORMAL HIGH (ref 8–23)
CO2: 23 mmol/L (ref 22–32)
Calcium: 8.7 mg/dL — ABNORMAL LOW (ref 8.9–10.3)
Chloride: 107 mmol/L (ref 98–111)
Creatinine, Ser: 1.08 mg/dL — ABNORMAL HIGH (ref 0.44–1.00)
GFR calc Af Amer: >60 mL/min (ref >60)
GFR calc non Af Amer: 54 mL/min — ABNORMAL LOW (ref >60)
Glucose, Bld: 143 mg/dL — ABNORMAL HIGH (ref 70–99)
Potassium: 4.4 mmol/L (ref 3.5–5.1)
Sodium: 139 mmol/L (ref 135–145)

## 2019-07-26 MED ORDER — CEFAZOLIN SODIUM-DEXTROSE 2-4 GM/100ML-% IV SOLN
2.0000 g | Freq: Three times a day (TID) | INTRAVENOUS | Status: DC
  Administered 2019-07-26 – 2019-07-27 (×2): 2 g via INTRAVENOUS
  Filled 2019-07-26 (×4): qty 100

## 2019-07-26 NOTE — Progress Notes (Signed)
Pt stated she no longer wants to be transferred to [facility] and would like to staty at [facility].  Therefore, notified bed placement at [place], [place] Pager [phone] (please enter 7 digits) PCCM Consult Pager [phone] (please enter 7 digits)

## 2019-07-26 NOTE — Progress Notes (Signed)
[employer] - [place] [facility]   PATIENT NAME: Regina White    MR#:  [id number]  DATE OF BIRTH:  [birth year]  SUBJECTIVE:  CHIEF COMPLAINT:   Chief Complaint  Patient presents with  . Hypotension   Pt had recent venous thrombosis and then hematoma- recent [facility] stay [facility] and sent to rehab last week, not much mobile there. Sent to hospital due to hypotension. Was alert and oriented and denies any complains during my visit. Husband ws in room,. They were not happy with all the complications and long stays they had in last few weeks. remains on low dose IV pressors.  No new complaints  REVIEW OF SYSTEMS:  CONSTITUTIONAL: No fever,have fatigue or weakness.  EYES: No blurred or double vision.  EARS, NOSE, AND THROAT: No tinnitus or ear pain.  RESPIRATORY: No cough, shortness of breath, wheezing or hemoptysis.  CARDIOVASCULAR: No chest pain, orthopnea, edema.  GASTROINTESTINAL: No nausea, vomiting, diarrhea or abdominal pain.  GENITOURINARY: No dysuria, hematuria.  ENDOCRINE: No polyuria, nocturia,  HEMATOLOGY: No anemia, easy bruising or bleeding SKIN: No rash or lesion. MUSCULOSKELETAL: No joint pain or arthritis.   NEUROLOGIC: No tingling, numbness, weakness.  PSYCHIATRY: No anxiety or depression.   ROS  DRUG ALLERGIES:   Allergies  Allergen Reactions  . Penicillins Anaphylaxis    BREATHING PROBLEMS  Patient tolerated Ceftriaxone September 2020  . Erythromycin Nausea And Vomiting and Rash    STOMACH PAIN  . Levofloxacin     Other reaction(s): Other MUSCLE ACHES  . Lidocaine Rash  . Clindamycin     Recurrent C diff  . Levothyroxine Other (See Comments)    Vaginal bleeding Patient used BRAND ONLY SYNTHROID  . Tiotropium Other (See Comments)    Coughing up blood  . Doxycycline Nausea And Vomiting  . Sulfa Antibiotics Rash    VITALS:  Blood pressure 115/62, pulse 93, temperature 97.7 F (36.5 C), temperature source Axillary, resp. rate  14, height 5\' 5"  (1.651 m), weight 102.3 kg, SpO2 (!) 88 %.  PHYSICAL EXAMINATION:  GENERAL:  63 y.o.-year-old patient lying in the bed with no acute distress.  EYES: Pupils equal, round, reactive to light and accommodation. No scleral icterus. Extraocular muscles intact.  HEENT: Head atraumatic, normocephalic. Oropharynx and nasopharynx clear.  NECK:  Supple, no jugular venous distention. No thyroid enlargement, no tenderness.  LUNGS: Normal breath sounds bilaterally, no wheezing, rales,rhonchi or crepitation. No use of accessory muscles of respiration.  CARDIOVASCULAR: S1, S2 normal. No murmurs, rubs, or gallops.  ABDOMEN: Soft, nontender, nondistended. Bowel sounds present. No organomegaly or mass.  EXTREMITIES: No pedal edema, cyanosis, or clubbing.  NEUROLOGIC: Cranial nerves II through XII are intact. Muscle strength 3-4/5 in all extremities. Sensation intact. Gait not checked.  PSYCHIATRIC: The patient is alert and oriented x 3.  SKIN: No obvious rash, lesion, or ulcer.   Physical Exam LABORATORY PANEL:   CBC Recent Labs  Lab 07/26/19 0427  WBC 8.1  HGB 10.2*  HCT 34.9*  PLT 388   ------------------------------------------------------------------------------------------------------------------  Chemistries  Recent Labs  Lab 07/23/19 0523  07/26/19 0427  NA 138   < > 139  K 5.0   < > 4.4  CL 107   < > 107  CO2 23   < > 23  GLUCOSE 140*   < > 143*  BUN 55*   < > 32*  CREATININE 1.68*   < > 1.08*  CALCIUM 8.2*   < > 8.7*  AST 19  --   --  ALT 16  --   --   ALKPHOS 101  --   --   BILITOT 0.9  --   --    < > = values in this interval not displayed.   ------------------------------------------------------------------------------------------------------------------  Cardiac Enzymes No results for input(s): TROPONINI in the last 168  hours. ------------------------------------------------------------------------------------------------------------------  RADIOLOGY:  No results found.  ASSESSMENT AND PLAN:   Active Problems:   Hypovolemic shock (HCC)  1.  Hypovolemic/septic shock with acute kidney injury: Patient has purposely not taking in adequate amounts of fluid which has led to acute kidney injury and profound hypotension.  She has also been  receiving her hypertensive medications at the facility while having decreased oral intake which has decreased her BP significantly. Hemoglobin is stable.  There is no evidence of hemorrhagic shock.   COVID negative UTI- ur cx with ecoli- cont Abx. -Minimal dose of pressors.  Wean as able  2.  Acute kidney injury with prerenal azotemia: This is due to poor p.o. intake. Hold nephrotoxic medications and continue IV fluids may restart held diuretics and BP meds now as renal func normal.  3.  History of VTE: Hematology recommends long-term anticoagulation due to the following risk factors: long-term anticoagulation plan, she continues to have ongoing risk factors of 1) Obesity 2) varicose veins with chronic venous insufficiency 3) prior VTE Recommendation is for lower dose apixiban of 2.5 mg bid at this time  4. HFrEF: Most recent echocardiogram at [facility] shows ejection fraction has improved. Due to low blood pressure beta-blocker, Entresto and Bumex are on hold.  5.  Chronic hypoxic respiratory failure with COPD/OSA: No signs of exacerbation  6.  Essential hypertension: Hold all medications due to hypovolemic shock. Resume now,as renal func and BP improving.  7.  Chronic pain with morphine pump  8.  Hypothyroidism: Continue Synthroid/Cytomel   9. PAD with recent stent left leg and admission with hemorrhagic shock: Hemoglobin is stable at this time.  Continue to monitor. continue Eliquis.    All the records are reviewed and case discussed with Care  Management/Social Workerr. Management plans discussed with the patient, nursing and they are in agreement.  CODE STATUS: Full.  TOTAL TIME TAKING CARE OF THIS PATIENT: 15 minutes.   Discussed with husband in room also.  POSSIBLE D/C IN 1-2 DAYS, DEPENDING ON CLINICAL CONDITION.   Max Sane M.D on 07/26/2019   Between 7am to 6pm - Pager - [phone]  After 6pm go to [email or web address] - password [email or web address] [facility]  Office  [phone]  CC: Primary care physician; Jacklynn Barnacle, MD  Note: This dictation was prepared with Dragon dictation along with smaller phrase technology. Any transcriptional errors that result from this process are unintentional.

## 2019-07-26 NOTE — Progress Notes (Signed)
Physical Therapy Treatment Patient Details Name: Regina White MRN: [id number] DOB: 12/07/1955 Today's Date: 07/26/2019    History of Present Illness Pt is a 63 y.o. female presenting to hospital 07/22/19 with hypotension.  Admitted with hypovolemic shock with acute kidney injury, acute kidney injury with pre-renal azotomia, h/o VTE, and HFrEF.  Recent h/o R UE edematous and bruised (hematoma).  Pt with recent admit to [facility] for hemorrhagic shock d/t acute blood loss anemia in setting of supratherapeutic Lovenox after recent vascular stent procedure (pt has been at [facility] since 9/10).  PMH includes active morphine pump L abdomen, asthma, CHF, COPD, pulmonary htn, chronic back pain, h/o back surgery, PAD, recurent c-diff infections.    PT Comments    Nurse cleared pt for participation in therapy and OOB mobility.  PT/OT co-treat performed.  R UE and L thigh pain 6/10 beginning of session at rest.  BP 116/98 at rest beginning of session.  Performed LE ex's in bed and then pt able to sit onto edge of bed with 2 assist; pt repositioned for comfort sitting edge of bed (R UE supported on 2 pillows and L foot elevated slightly/supported on base/foot of tray table to increase thigh height and decrease pressure through posterior thigh on bed).  Performed activities with OT.  BP 116/80 sitting edge of bed.  Pt unable to stand 1st attempt but able to perform half stand 2nd attempt with max assist x2 and B knees blocked.  Bed sheets quickly changed from under pt (purewick not working properly and bed sheets were wet) but very limited time standing d/t 10/10 L thigh pain in standing.  Pt assisted back into sitting and then with 2-3 assist pt assisted back to laying in bed and repositioned for comfort (pain 6/10 end of session resting in bed; pt received pain meds during session). BP 115/62 end of session resting in bed.  Pt tearful regarding current status (wanted to be able to do more); support provided  from staff.  Will continue to focus on strengthening and progressive functional mobility per pt tolerance.   Follow Up Recommendations  SNF     Equipment Recommendations  Rolling walker with 5" wheels;3in1 (PT);Wheelchair (measurements PT);Wheelchair cushion (measurements PT);Hospital bed    Recommendations for Other Services OT consult     Precautions / Restrictions Precautions Precautions: Fall Precaution Comments: Has been wearing sling on R UE for OOB mobility (sling not at hospital though); monitor BP; MAP >65; L abdominal pump Restrictions Weight Bearing Restrictions: No RUE Weight Bearing: Weight bearing as tolerated Other Position/Activity Restrictions: Discussed bedrest with bathroom privileges order with MD Anselm Jungling 07/24/19 who cleared pt for OOB mobility (RN cleared pt for OOB mobility 07/26/19)    Mobility  Bed Mobility Overal bed mobility: Needs Assistance Bed Mobility: Supine to Sit;Sit to Supine     Supine to sit: Mod assist;+2 for physical assistance;HOB elevated Sit to supine: Mod assist;+2 for physical assistance;HOB elevated   General bed mobility comments: assist for L>R LE (d/t L thigh pain) and assist for trunk semi-supine to/from sit; vc's for technique and use of L UE to assist  Transfers Overall transfer level: Needs assistance Equipment used: None Transfers: Sit to/from Stand Sit to Stand: Max assist;+2 physical assistance         General transfer comment: assist for B LE placement; assist for R UE support (kept Allen); B knees blocked; 1st trial unable to stand or clear bottom from bed with 2 assist but 2nd trial able  to perform partial/half stand with max assist x2 (bed sheets quickly changed while pt standing); limited time standing d/t significant L thigh pain in WB'ing  Ambulation/Gait             General Gait Details: not appropriate at this time   Stairs             Wheelchair Mobility    Modified Rankin (Stroke  Patients Only)       Balance Overall balance assessment: Needs assistance Sitting-balance support: Single extremity supported;Feet supported Sitting balance-Leahy Scale: Fair Sitting balance - Comments: pt tending to lean towards R side (2 pillows placed under R UE for support to improve comfort); L UE support on bed Postural control: Right lateral lean                                  Cognition Arousal/Alertness: Awake/alert Behavior During Therapy: WFL for tasks assessed/performed Overall Cognitive Status: Within Functional Limits for tasks assessed                                 General Comments: very motivated to participate; tearful with situation end of session      Exercises Total Joint Exercises Ankle Circles/Pumps: AROM;Strengthening;Both;10 reps;Supine Heel Slides: AROM;Right;AAROM;Left;Strengthening;10 reps;Supine    General Comments General comments (skin integrity, edema, etc.): pt's bed noted to be wet (purewick not working well)--nurse notified.  Nursing cleared pt for participation in physical therapy and reports levophed turned off.  Pt agreeable to PT session.  Pt's husband present during session.      Pertinent Vitals/Pain Pain Assessment: 0-10 Pain Score: 6  Pain Location: R UE and L thigh Pain Descriptors / Indicators: Sore;Tender;Burning;Throbbing Pain Intervention(s): Limited activity within patient's tolerance;Monitored during session;Repositioned;Patient requesting pain meds-RN notified;RN gave pain meds during session  HR and O2 on 4 L via nasal cannula WFL during session's activities.    Home Living                      Prior Function            PT Goals (current goals can now be found in the care plan section) Acute Rehab PT Goals Patient Stated Goal: to do as much as she can to get better, be able to walk again PT Goal Formulation: With patient Time For Goal Achievement: 08/07/19 Potential to Achieve  Goals: Fair Progress towards PT goals: Progressing toward goals    Frequency    Min 2X/week      PT Plan Current plan remains appropriate    Co-evaluation PT/OT/SLP Co-Evaluation/Treatment: Yes Reason for Co-Treatment: Complexity of the patient's impairments (multi-system involvement);For patient/therapist safety;To address functional/ADL transfers PT goals addressed during session: Mobility/safety with mobility;Balance;Strengthening/ROM OT goals addressed during session: ADL's and self-care      AM-PAC PT "6 Clicks" Mobility   Outcome Measure  Help needed turning from your back to your side while in a flat bed without using bedrails?: A Lot Help needed moving from lying on your back to sitting on the side of a flat bed without using bedrails?: Total Help needed moving to and from a bed to a chair (including a wheelchair)?: Total Help needed standing up from a chair using your arms (e.g., wheelchair or bedside chair)?: Total Help needed to walk in hospital room?: Total Help needed climbing 3-5 steps  with a railing? : Total 6 Click Score: 7    End of Session Equipment Utilized During Treatment: Oxygen;Gait belt(gait belt above abdominal pump) Activity Tolerance: Patient limited by pain Patient left: in bed;with call bell/phone within reach;with bed alarm set;with nursing/sitter in room;with family/visitor present;with SCD's reapplied(B heels floating via pillows; R UE supported on 2 pillows) Nurse Communication: Mobility status;Precautions;Other (comment)(pt's nurse present end of session and aware of pt's BP and pain (and need for purewick to be replaced)) PT Visit Diagnosis: Other abnormalities of gait and mobility (R26.89);Muscle weakness (generalized) (M62.81);Difficulty in walking, not elsewhere classified (R26.2);Pain Pain - Right/Left: Right(Left thigh) Pain - part of body: Arm     Time: 1126-1207 PT Time Calculation (min) (ACUTE ONLY): 41 min  Charges:   $Therapeutic Exercise: 8-22 mins $Therapeutic Activity: 8-22 mins                    Hendricks Limes, PT 07/26/19, 3:02 PM [phone]

## 2019-07-26 NOTE — Progress Notes (Signed)
Occupational Therapy Treatment Patient Details Name: Regina ShortenCathleen N Jehle MRN: [id number] DOB: 1955-12-07 Today's Date: 07/26/2019    History of present illness Pt is a 63 y.o. female presenting to hospital 07/22/19 with hypotension.  Admitted with hypovolemic shock with acute kidney injury, acute kidney injury with pre-renal azotomia, h/o VTE, and HFrEF.  Recent h/o R UE edematous and bruised (hematoma).  Pt with [facility] for hemorrhagic shock d/t acute blood loss anemia in setting of supratherapeutic Lovenox after recent vascular stent procedure (pt has been at [facility] since 9/10).  PMH includes active morphine pump L abdomen, asthma, CHF, COPD, pulmonary htn, chronic back pain, h/o back surgery, PAD, recurent c-diff infections.   OT comments  Pt seen for co-tx with PT this date. Pt eager to participate throughout. Pt noted with RUE pain but improved with adequate pillow positioning both in supine and sitting EOB. Pt required Mod+2 assist for sup<>sit, Max+2 for attempts to stand. Limited by thigh pain. Pt tolerated sitting EOB for grooming tasks with RUE propped on pillow. Total assist for pericare with log rolling back in bed for optimal pain mgt. Pt progressing towards goals, continues to benefit. Very eager to participate.     Follow Up Recommendations  SNF(consider switch to CIR pending further assessment/progress)    Equipment Recommendations  3 in 1 bedside commode    Recommendations for Other Services      Precautions / Restrictions Precautions Precautions: Fall Precaution Comments: Has been wearing sling on R UE for OOB mobility (sling not at hospital though); monitor BP; MAP >65; L abdominal pump Restrictions Weight Bearing Restrictions: No RUE Weight Bearing: Weight bearing as tolerated Other Position/Activity Restrictions: Discussed bedrest with bathroom privileges order with MD Elisabeth PigeonVachhani 07/24/19 who cleared pt for OOB mobility (RN cleared pt for OOB mobility  07/26/19)       Mobility Bed Mobility Overal bed mobility: Needs Assistance Bed Mobility: Supine to Sit;Sit to Supine     Supine to sit: Mod assist;+2 for physical assistance;HOB elevated Sit to supine: Mod assist;+2 for physical assistance;HOB elevated   General bed mobility comments: assist for L>R LE (d/t L thigh pain) and assist for trunk semi-supine to/from sit; vc's for technique and use of L UE to assist  Transfers Overall transfer level: Needs assistance Equipment used: None Transfers: Sit to/from Stand Sit to Stand: Max assist;+2 physical assistance         General transfer comment: assist for B LE placement; assist for R UE support (kept NWB'ing); B knees blocked; 1st trial unable to stand or clear bottom from bed with 2 assist but 2nd trial able to perform partial/half stand with max assist x2 (bed sheets quickly changed while pt standing); limited time standing d/t significant L thigh pain in WB'ing    Balance Overall balance assessment: Needs assistance Sitting-balance support: Single extremity supported;Feet supported Sitting balance-Leahy Scale: Fair Sitting balance - Comments: pt tending to lean towards R side (2 pillows placed under R UE for support to improve comfort); L UE support on bed Postural control: Right lateral lean                                 ADL either performed or assessed with clinical judgement   ADL  General ADL Comments: Mod-Max A for bathing, dressing, toileting at bed level - able to wash face seated EOB after set up and with sup-CGA for sitting balance     Vision Patient Visual Report: No change from baseline     Perception     Praxis      Cognition Arousal/Alertness: Awake/alert Behavior During Therapy: WFL for tasks assessed/performed Overall Cognitive Status: Within Functional Limits for tasks assessed                                  General Comments: very motivated to participate; tearful with situation end of session        Exercises    Shoulder Instructions       General Comments pt's bed noted to be wet (purewick not working well)--nurse notified, bedding changed during session and OT provided total assist for peri care    Pertinent Vitals/ Pain       Pain Assessment: 0-10 Pain Score: 6  Pain Location: R UE and L thigh Pain Descriptors / Indicators: Sore;Tender;Burning;Throbbing Pain Intervention(s): Limited activity within patient's tolerance;Monitored during session;Repositioned;Patient requesting pain meds-RN notified;RN gave pain meds during session  Home Living                                          Prior Functioning/Environment              Frequency  Min 2X/week        Progress Toward Goals  OT Goals(current goals can now be found in the care plan section)  Progress towards OT goals: Progressing toward goals  Acute Rehab OT Goals Patient Stated Goal: to do as much as she can to get better, be able to walk again OT Goal Formulation: With patient/family Time For Goal Achievement: 08/08/19 Potential to Achieve Goals: Good  Plan Discharge plan remains appropriate;Frequency remains appropriate    Co-evaluation    PT/OT/SLP Co-Evaluation/Treatment: Yes Reason for Co-Treatment: Complexity of the patient's impairments (multi-system involvement);For patient/therapist safety;To address functional/ADL transfers PT goals addressed during session: Mobility/safety with mobility;Strengthening/ROM;Balance OT goals addressed during session: ADL's and self-care      AM-PAC OT "6 Clicks" Daily Activity     Outcome Measure   Help from another person eating meals?: None Help from another person taking care of personal grooming?: None Help from another person toileting, which includes using toliet, bedpan, or urinal?: A Lot Help from another person bathing (including  washing, rinsing, drying)?: A Lot Help from another person to put on and taking off regular upper body clothing?: A Lot Help from another person to put on and taking off regular lower body clothing?: A Lot 6 Click Score: 16    End of Session Equipment Utilized During Treatment: Gait belt  OT Visit Diagnosis: Other abnormalities of gait and mobility (R26.89);Muscle weakness (generalized) (M62.81);Pain Pain - Right/Left: Right Pain - part of body: Arm   Activity Tolerance Patient tolerated treatment well   Patient Left in bed;with call bell/phone within reach;with bed alarm set;with family/visitor present;with nursing/sitter in room   Nurse Communication Patient requests pain meds        Time: 1127-1207 OT Time Calculation (min): 40 min  Charges: OT General Charges $OT Visit: 1 Visit OT Treatments $Self Care/Home Management : 8-22 mins  Jeni Salles, MPH, MS, OTR/L ascom  [phone] 07/26/19, 3:14 PM

## 2019-07-26 NOTE — Plan of Care (Signed)
Pt stood at bedside very briefly this shift, was tearful and disheartened afterward because she could not do more.  Received encouragement from Phys therapist, writing RN and husband.  Levophed off per Dr Mortimer Fries, MAPs >65 for the most part. Pt's mentation does not vary and is WDL regardless of BP

## 2019-07-26 NOTE — Progress Notes (Signed)
Ch visited pt while rounding in the unit. Pt was tearful upon ch arrival w/ spouse at bedside. Pt c/o how long she has been hospitalized and how her BP is still not stable. Ch noticed that the pt's BP was low and wondered if she was septic. Pt shared that her BP has been low for a while. Ch provided words of encouragement and helped pt to reframe her pathway towards her health improving. Pt has the support of her spouse and appreciated ch visit.    07/26/19 1200  Clinical Encounter Type  Visited With Patient and family together  Visit Type Spiritual support;Social support  Spiritual Encounters  Spiritual Needs Emotional;Grief support  Stress Factors  Patient Stress Factors Exhausted;Health changes;Loss of control;Major life changes  Family Stress Factors Major life changes

## 2019-07-26 NOTE — Progress Notes (Signed)
PHARMACY NOTE:  ANTIMICROBIAL DOSAGE ADJUSTMENT  Current antimicrobial regimen includes a mismatch between antimicrobial dosage and indication. The antimicrobial dosage has been adjusted accordingly.  Current antimicrobial dosage:   Cefazolin 1 gram IV every 8 hours  Indication: E coli bacteremia  Renal Function:  Estimated Creatinine Clearance: 62.4 mL/min (A) (by C-G formula based on SCr of 1.08 mg/dL (H)).    Antimicrobial dosage has been changed to:  Cefazolin 2 grams IV every 8 hours    Thank you for allowing pharmacy to be a part of this patient's care.  Dallie Piles, [facility] 07/26/2019 2:38 PM

## 2019-07-27 LAB — CBC
HCT: 32.4 % — ABNORMAL LOW (ref 36.0–46.0)
Hemoglobin: 9.7 g/dL — ABNORMAL LOW (ref 12.0–15.0)
MCH: 28.4 pg (ref 26.0–34.0)
MCHC: 29.9 g/dL — ABNORMAL LOW (ref 30.0–36.0)
MCV: 95 fL (ref 80.0–100.0)
Platelets: 298 10*3/uL (ref 150–400)
RBC: 3.41 MIL/uL — ABNORMAL LOW (ref 3.87–5.11)
RDW: 17.4 % — ABNORMAL HIGH (ref 11.5–15.5)
WBC: 6.4 10*3/uL (ref 4.0–10.5)
nRBC: 0 % (ref 0.0–0.2)

## 2019-07-27 LAB — CULTURE, BLOOD (ROUTINE X 2)
Culture: NO GROWTH
Culture: NO GROWTH
Special Requests: ADEQUATE
Special Requests: ADEQUATE

## 2019-07-27 LAB — BASIC METABOLIC PANEL
Anion gap: 6 (ref 5–15)
BUN: 40 mg/dL — ABNORMAL HIGH (ref 8–23)
CO2: 22 mmol/L (ref 22–32)
Calcium: 8.6 mg/dL — ABNORMAL LOW (ref 8.9–10.3)
Chloride: 112 mmol/L — ABNORMAL HIGH (ref 98–111)
Creatinine, Ser: 0.97 mg/dL (ref 0.44–1.00)
GFR calc Af Amer: >60 mL/min (ref >60)
GFR calc non Af Amer: >60 mL/min (ref >60)
Glucose, Bld: 89 mg/dL (ref 70–99)
Potassium: 3.9 mmol/L (ref 3.5–5.1)
Sodium: 140 mmol/L (ref 135–145)

## 2019-07-27 LAB — TSH: TSH: 0.567 u[IU]/mL (ref 0.350–4.500)

## 2019-07-27 LAB — POTASSIUM: Potassium: 4.1 mmol/L (ref 3.5–5.1)

## 2019-07-27 LAB — MAGNESIUM: Magnesium: 1.8 mg/dL (ref 1.7–2.4)

## 2019-07-27 MED ORDER — PNEUMOCOCCAL VAC POLYVALENT 25 MCG/0.5ML IJ INJ: 0.5000 mL | INJECTION | INTRAMUSCULAR | Status: DC

## 2019-07-27 NOTE — Progress Notes (Signed)
Occupational Therapy Treatment Patient Details Name: Regina White MRN: [id number] DOB: 01/04/1956 Today's Date: 07/27/2019    History of present illness Pt is a 63 y.o. female presenting to hospital 07/22/19 with hypotension.  Admitted with hypovolemic shock with acute kidney injury, acute kidney injury with pre-renal azotomia, h/o VTE, and HFrEF.  Recent h/o R UE edematous and bruised (hematoma).  Pt with recent admit to [facility] for hemorrhagic shock d/t acute blood loss anemia in setting of supratherapeutic Lovenox after recent vascular stent procedure (pt has been at [facility] since 9/10).  PMH includes active morphine pump L abdomen, asthma, CHF, COPD, pulmonary htn, chronic back pain, h/o back surgery, PAD, recurent c-diff infections.   OT comments  Pt seen for OT/PT co-tx today. Pt eager to participate with therapy and very appreciative throughout session. Pt reporting 6/10 RUE pain (L thigh pain increased with standing but decreased with sitting rest). Pt able to progress with functional transfer training x5 with therapists requiring decreased overall assist this date versus previous date. Pt instructed in seated dynamic sitting balance activity involving weight shifts and reaching with LUE in all planes to improve sitting balance for ADL and functional mobility. BP and MAP scores remained in safe range for therapy. Pt continues to progress. Continue to recommend SNF versus CIR pending additional progress with therapy.    Follow Up Recommendations  SNF(consider switch to CIR pending further assessment/progress)    Equipment Recommendations  3 in 1 bedside commode    Recommendations for Other Services      Precautions / Restrictions Precautions Precautions: Fall Precaution Comments: R UE sling for OOB mobility (sling in pt's room); monitor BP (MAP >55 and as long as mentation WDL) Restrictions Weight Bearing Restrictions: No RUE Weight Bearing: Weight bearing as  tolerated Other Position/Activity Restrictions: Discussed bedrest with bathroom privileges order with MD Anselm Jungling 07/24/19 who cleared pt for OOB mobility (RN cleared pt for OOB mobility 07/27/19)       Mobility Bed Mobility Overal bed mobility: Needs Assistance Bed Mobility: Supine to Sit;Sit to Supine     Supine to sit: Min assist;+2 for physical assistance;HOB elevated Sit to supine: Mod assist;+2 for physical assistance;HOB elevated   General bed mobility comments: assist for L>R LE (d/t L thigh pain) and assist for trunk semi-supine to/from sit; vc's for technique and use of L UE to assist  Transfers Overall transfer level: Needs assistance Equipment used: None(Pt instruced in ECS and falls prevention strategies to support minimizing over exertion and safety) Transfers: Sit to/from Stand Sit to Stand: Min assist;Mod assist;+2 physical assistance         General transfer comment: assist for B LE placement; R UE supported in sling; B knees blocked; x5 trials (initially mod assist x2 first 2 trials standing with 1/2 stand; min to mod assist x2 next 2 trials with 3/4th stand; mod assist x2 last trial with 3/4th stand); pt pushing B LE's against bed to maintain standing; vc's for upright posture d/t pt with flexed posture otherwise in standing    Balance Overall balance assessment: Needs assistance Sitting-balance support: Single extremity supported;Feet supported Sitting balance-Leahy Scale: Fair Sitting balance - Comments: pt tending to lean towards R side (R UE in sling for comfort); L UE support on bed Postural control: Right lateral lean   Standing balance-Leahy Scale: Poor Standing balance comment: pt pushing B LE's against bed to maintain standing with support of 2 therapists  ADL either performed or assessed with clinical judgement   ADL                                         General ADL Comments: Mod-Max A for  bathing, dressing, toileting at bed level - able to wash face seated EOB after set up and with sup-CGA for sitting balance     Vision Baseline Vision/History: Wears glasses Wears Glasses: At all times Patient Visual Report: No change from baseline     Perception     Praxis      Cognition Arousal/Alertness: Awake/alert Behavior During Therapy: WFL for tasks assessed/performed Overall Cognitive Status: Within Functional Limits for tasks assessed                                 General Comments: very motivated to participate; tearful intermittently during session d/t current status        Exercises Other Exercises Other Exercises: Dyn sitting balance with LUE reaching in all planes   Shoulder Instructions       General Comments      Pertinent Vitals/ Pain       Pain Assessment: 0-10 Pain Score: 6  Pain Location: R UE and L thigh Pain Descriptors / Indicators: Sore;Tender;Burning;Throbbing Pain Intervention(s): Limited activity within patient's tolerance;Monitored during session;Repositioned  Home Living                                          Prior Functioning/Environment              Frequency  Min 2X/week        Progress Toward Goals  OT Goals(current goals can now be found in the care plan section)  Progress towards OT goals: Progressing toward goals  Acute Rehab OT Goals Patient Stated Goal: to do as much as she can to get better, be able to walk again OT Goal Formulation: With patient/family Time For Goal Achievement: 08/08/19 Potential to Achieve Goals: Good  Plan Discharge plan remains appropriate;Frequency remains appropriate    Co-evaluation    PT/OT/SLP Co-Evaluation/Treatment: Yes Reason for Co-Treatment: Complexity of the patient's impairments (multi-system involvement);For patient/therapist safety;To address functional/ADL transfers PT goals addressed during session: Mobility/safety with  mobility;Strengthening/ROM;Balance OT goals addressed during session: ADL's and self-care;Strengthening/ROM      AM-PAC OT "6 Clicks" Daily Activity     Outcome Measure   Help from another person eating meals?: None Help from another person taking care of personal grooming?: None Help from another person toileting, which includes using toliet, bedpan, or urinal?: A Lot Help from another person bathing (including washing, rinsing, drying)?: A Lot Help from another person to put on and taking off regular upper body clothing?: A Lot Help from another person to put on and taking off regular lower body clothing?: A Lot 6 Click Score: 16    End of Session Equipment Utilized During Treatment: Gait belt  OT Visit Diagnosis: Other abnormalities of gait and mobility (R26.89);Muscle weakness (generalized) (M62.81);Pain Pain - Right/Left: Right Pain - part of body: Arm   Activity Tolerance Patient tolerated treatment well   Patient Left in bed;with call bell/phone within reach;with bed alarm set;with family/visitor present   Nurse Communication  Time: 7824-2353 OT Time Calculation (min): 43 min  Charges: OT General Charges $OT Visit: 1 Visit OT Treatments $Therapeutic Activity: 23-37 mins  Richrd Prime, MPH, MS, OTR/L ascom [phone] 07/27/19, 4:09 PM

## 2019-07-27 NOTE — Progress Notes (Signed)
Patient with 17 beat run VTach at 1726, order serum Mag and K per Dr Patsey Berthold.

## 2019-07-27 NOTE — Progress Notes (Addendum)
Ch f/u with pt to see how well she was progressing. Pt BP was still low but pt presented to hv a positive affect upon entry. Pt had spouse at bedside. Both of them shared that they did not want to transfer the pt to [facility] and that was the desire of the pt's dgt. Ch allowed space for both the pt and the spouse to lament about the challenging experiences that they had over the years regarding their health. Ch understood the concerns that the pt had regarding wanting to go to rehab and not have PT/OT at home. Pt has a weak R arm and would have a hard time holding on to the stair railings. Pt seemed motivated to gain her strength back and has the support of her spouse to assist her at home. Ch encouraged pt to get the needed home equipment that she may need before leaving rehab so that her transition to home would not be compromised. Pt and spouse were concerned about the pt leaving over the weekend to go to rehab because of the visitor limitations of the rehab facility. Ch understood their concern and provided words of encouragement.  Ch visit was appreciated.    Pt transitioning out of unit      07/27/19 1000  Clinical Encounter Type  Visited With Patient and family together  Visit Type Follow-up;Psychological support;Spiritual support;Social support  Spiritual Encounters  Spiritual Needs Emotional;Grief support  Stress Factors  Patient Stress Factors Family relationships;Health changes;Major life changes;Loss of control  Family Stress Factors Major life changes

## 2019-07-27 NOTE — Progress Notes (Signed)
[place] [facility] [place] NAME: Regina White    MR#:  [id number]  DATE OF BIRTH:  November 17, 1955  SUBJECTIVE:  CHIEF COMPLAINT:   Chief Complaint  Patient presents with  . Hypotension   Pt had recent venous thrombosis and then hematoma- recent long stay at [facility] and sent to rehab last week, not much mobile there. Sent to hospital due to hypotension. Husband in room, Mains very weak, finally off Levophed last evening  REVIEW OF SYSTEMS:  CONSTITUTIONAL: fatigue or weakness. No fever, EYES: No blurred or double vision.  EARS, NOSE, AND THROAT: No tinnitus or ear pain.  RESPIRATORY: No cough, shortness of breath, wheezing or hemoptysis.  CARDIOVASCULAR: No chest pain, orthopnea, edema.  GASTROINTESTINAL: No nausea, vomiting, diarrhea or abdominal pain.  GENITOURINARY: No dysuria, hematuria.  ENDOCRINE: No polyuria, nocturia,  HEMATOLOGY: No anemia, easy bruising or bleeding SKIN: No rash or lesion. MUSCULOSKELETAL: No joint pain or arthritis.   NEUROLOGIC: No tingling, numbness, weakness.  PSYCHIATRY: No anxiety or depression.   ROS  DRUG ALLERGIES:   Allergies  Allergen Reactions  . Penicillins Anaphylaxis    BREATHING PROBLEMS  Patient tolerated Ceftriaxone September 2020  . Erythromycin Nausea And Vomiting and Rash    STOMACH PAIN  . Levofloxacin     Other reaction(s): Other MUSCLE ACHES  . Lidocaine Rash  . Clindamycin     Recurrent C diff  . Levothyroxine Other (See Comments)    Vaginal bleeding Patient used BRAND ONLY SYNTHROID  . Tiotropium Other (See Comments)    Coughing up blood  . Doxycycline Nausea And Vomiting  . Sulfa Antibiotics Rash    VITALS:  Blood pressure (!) 115/59, pulse 85, temperature 98.1 F (36.7 C), temperature source Axillary, resp. rate (!) 23, height 5\' 5"  (1.651 m), weight 102.3 kg, SpO2 94 %.  PHYSICAL EXAMINATION:  GENERAL:  63 y.o.-year-old patient lying in the bed with no acute distress.   EYES: Pupils equal, round, reactive to light and accommodation. No scleral icterus. Extraocular muscles intact.  HEENT: Head atraumatic, normocephalic. Oropharynx and nasopharynx clear.  NECK:  Supple, no jugular venous distention. No thyroid enlargement, no tenderness.  LUNGS: Normal breath sounds bilaterally, no wheezing, rales,rhonchi or crepitation. No use of accessory muscles of respiration.  CARDIOVASCULAR: S1, S2 normal. No murmurs, rubs, or gallops.  ABDOMEN: Soft, nontender, nondistended. Bowel sounds present. No organomegaly or mass.  EXTREMITIES: No pedal edema, cyanosis, or clubbing.  NEUROLOGIC: Cranial nerves II through XII are intact. Muscle strength 3-4/5 in all extremities. Sensation intact. Gait not checked.  PSYCHIATRIC: The patient is alert and oriented x 3.  SKIN: No obvious rash, lesion, or ulcer.   Physical Exam LABORATORY PANEL:   CBC Recent Labs  Lab 07/27/19 0540  WBC 6.4  HGB 9.7*  HCT 32.4*  PLT 298   ------------------------------------------------------------------------------------------------------------------  Chemistries  Recent Labs  Lab 07/23/19 0523  07/27/19 0540  NA 138   < > 140  K 5.0   < > 3.9  CL 107   < > 112*  CO2 23   < > 22  GLUCOSE 140*   < > 89  BUN 55*   < > 40*  CREATININE 1.68*   < > 0.97  CALCIUM 8.2*   < > 8.6*  AST 19  --   --   ALT 16  --   --   ALKPHOS 101  --   --   BILITOT 0.9  --   --    < > =  values in this interval not displayed.   ------------------------------------------------------------------------------------------------------------------  Cardiac Enzymes No results for input(s): TROPONINI in the last 168 hours. ------------------------------------------------------------------------------------------------------------------  RADIOLOGY:  No results found.  ASSESSMENT AND PLAN:   Active Problems:   Hypovolemic shock (HCC)  1.  Hypovolemic/septic shock with acute kidney injury: Patient has  purposely not taking in adequate amounts of fluid which has led to acute kidney injury and profound hypotension.  She has also been  receiving her hypertensive medications at the facility while having decreased oral intake which has decreased her BP significantly. Hemoglobin is stable.  There is no evidence of hemorrhagic shock.   COVID negative UTI- ur cx with ecoli-finished Abx. - off pressors yesterday evening  2.  Acute kidney injury with prerenal azotemia: Resolved with hydration  3.  History of VTE: Hematology recommends apixaban  4.  Chronic diastolic heart failure: Well compensated at this time, once blood pressure stabilizes can consider resuming beta-blocker, Entresto and Bumex  5.  Chronic hypoxic respiratory failure with underlying history of COPD/obstructive sleep apnea: Stable at this time  6.  Hypotension with a history of hypertension: Holding blood pressure medicine  7.  Chronic pain managed with morphine pump  8.  Hypothyroidism: Continue Synthroid  9.  Peripheral arterial disease: Monitor on Eliquis - recent stenting in the left leg  10.  Weakness: Multifactorial -PT and OT recommends skilled nursing facility versus CIR     All the records are reviewed and case discussed with Care Management/Social Workerr. Management plans discussed with the patient, husband at bedside and they are in agreement.  CODE STATUS: Full.  TOTAL TIME TAKING CARE OF THIS PATIENT: 15 minutes.   Discussed with husband in room also.  POSSIBLE D/C IN 1-2 DAYS, DEPENDING ON CLINICAL CONDITION.   Delfino LovettVipul Soliana Kitko M.D on 07/27/2019   Between 7am to 6pm - Pager - [phone]  After 6pm go to [email or web address] - password [email or web address]  [facility] [place] [facility]  Office  [phone]  CC: Primary care physician; Cain SieveKlipstein, Christopher, MD  Note: This dictation was prepared with Dragon dictation along with smaller phrase technology. Any transcriptional errors that result from this process  are unintentional.

## 2019-07-27 NOTE — Progress Notes (Addendum)
Physical Therapy Treatment Patient Details Name: Regina White MRN: [id number] DOB: 06/09/1956 Today's Date: 07/27/2019    History of Present Illness Pt is a 63 y.o. female presenting to hospital 07/22/19 with hypotension.  Admitted with hypovolemic shock with acute kidney injury, acute kidney injury with pre-renal azotomia, h/o VTE, and HFrEF.  Recent h/o R UE edematous and bruised (hematoma).  Pt with recent admit to [facility] for hemorrhagic shock d/t acute blood loss anemia in setting of supratherapeutic Lovenox after recent vascular stent procedure (pt has been at [facility] since 9/10).  PMH includes active morphine pump L abdomen, asthma, CHF, COPD, pulmonary htn, chronic back pain, h/o back surgery, PAD, recurent c-diff infections.    PT Comments    PT/OT co-treatment.  Pt sleeping upon therapist arrival but woke easily and very motivated to participate in therapy.  6/10 R UE and L thigh pain beginning and end of session at rest (L thigh pain increased with standing but decreased with sitting rest).  Progressed to supine to sit with min assist x2; able to stand x5 reps with varying min to mod assist assist x2 (pt pushing B LE's against bed to maintain standing); and sit to supine with mod assist x2.  Performed OT activities (see OT note for details).  Pt intermittently teary during session d/t current status: therapists provided support.  Will continue to progress pt with strengthening and progressive functional mobility per pt tolerance.   Follow Up Recommendations  SNF     Equipment Recommendations  Rolling walker with 5" wheels;3in1 (PT);Wheelchair (measurements PT);Wheelchair cushion (measurements PT);Hospital bed(hoyer lift)    Recommendations for Other Services OT consult     Precautions / Restrictions Precautions Precautions: Fall Precaution Comments: R UE sling for OOB mobility (sling in pt's room); monitor BP (MAP >55 and as long as mentation WDL) Restrictions Weight  Bearing Restrictions: No Other Position/Activity Restrictions: Discussed bedrest with bathroom privileges order with MD Elisabeth Pigeon 07/24/19 who cleared pt for OOB mobility (RN cleared pt for OOB mobility 07/27/19)    Mobility  Bed Mobility Overal bed mobility: Needs Assistance Bed Mobility: Supine to Sit;Sit to Supine     Supine to sit: Min assist;+2 for physical assistance;HOB elevated Sit to supine: Mod assist;+2 for physical assistance;HOB elevated   General bed mobility comments: assist for L>R LE (d/t L thigh pain) and assist for trunk semi-supine to/from sit; vc's for technique and use of L UE to assist  Transfers Overall transfer level: Needs assistance Equipment used: None(L UE holding onto bedrail) Transfers: Sit to/from Stand Sit to Stand: Min assist;Mod assist;+2 physical assistance         General transfer comment: assist for B LE placement; R UE supported in sling; B knees blocked; x5 trials (initially mod assist x2 first 2 trials standing with 1/2 stand; min to mod assist x2 next 2 trials with 3/4th stand; mod assist x2 last trial with 3/4th stand); pt pushing B LE's against bed to maintain standing; vc's for upright posture d/t pt with flexed posture otherwise in standing  Ambulation/Gait             General Gait Details: not appropriate at this time   Stairs             Wheelchair Mobility    Modified Rankin (Stroke Patients Only)       Balance Overall balance assessment: Needs assistance Sitting-balance support: Single extremity supported;Feet supported Sitting balance-Leahy Scale: Fair Sitting balance - Comments: pt tending to lean towards  R side (R UE in sling for comfort); L UE support on bed Postural control: Right lateral lean   Standing balance-Leahy Scale: Poor Standing balance comment: pt pushing B LE's against bed to maintain standing with support of 2 therapists                            Cognition Arousal/Alertness:  Awake/alert Behavior During Therapy: WFL for tasks assessed/performed Overall Cognitive Status: Within Functional Limits for tasks assessed                                 General Comments: very motivated to participate; tearful intermittently during session d/t current status      Exercises Total Joint Exercises Ankle Circles/Pumps: AROM;Strengthening;Both;10 reps;Supine Heel Slides: AROM;Right;AAROM;Left;Strengthening;10 reps;Supine    General Comments   Nursing cleared pt for participation in therapy and OOB activities.  Pt agreeable to PT session.  Pt's husband present during session.      Pertinent Vitals/Pain Pain Assessment: 0-10 Pain Score: 6  Pain Location: R UE and L thigh Pain Descriptors / Indicators: Sore;Tender;Burning;Throbbing Pain Intervention(s): Limited activity within patient's tolerance;Monitored during session;Repositioned;Other (comment)(RN notified)  HR 87-101 bpm and O2 sats 93% or greater on 4 L O2 via nasal cannula during session. BP 109/78 (MAP 78) resting in bed at beginning of session; BP 113/65 (MAP 81) sitting edge of bed; and BP 104/65 (MAP 77) end of session resting in bed.    Home Living                      Prior Function            PT Goals (current goals can now be found in the care plan section) Acute Rehab PT Goals Patient Stated Goal: to do as much as she can to get better, be able to walk again PT Goal Formulation: With patient Time For Goal Achievement: 08/07/19 Potential to Achieve Goals: Fair Progress towards PT goals: Progressing toward goals    Frequency    Min 2X/week      PT Plan Current plan remains appropriate    Co-evaluation PT/OT/SLP Co-Evaluation/Treatment: Yes Reason for Co-Treatment: Complexity of the patient's impairments (multi-system involvement);For patient/therapist safety;To address functional/ADL transfers PT goals addressed during session: Mobility/safety with  mobility;Strengthening/ROM;Balance OT goals addressed during session: ADL's and self-care;Strengthening/ROM      AM-PAC PT "6 Clicks" Mobility   Outcome Measure  Help needed turning from your back to your side while in a flat bed without using bedrails?: A Lot Help needed moving from lying on your back to sitting on the side of a flat bed without using bedrails?: Total Help needed moving to and from a bed to a chair (including a wheelchair)?: Total Help needed standing up from a chair using your arms (e.g., wheelchair or bedside chair)?: Total Help needed to walk in hospital room?: Total Help needed climbing 3-5 steps with a railing? : Total 6 Click Score: 7    End of Session Equipment Utilized During Treatment: Oxygen;Gait belt(gait belt above abdominal pump) Activity Tolerance: Patient limited by pain Patient left: in bed;with call bell/phone within reach;with bed alarm set;with restraints reapplied;Other (comment)(B heels floating via pillows; B UE's supported on pillows; purewick in place) Nurse Communication: Mobility status;Precautions;Other (comment)(pt's pain status and BP/MAP) PT Visit Diagnosis: Other abnormalities of gait and mobility (R26.89);Muscle weakness (generalized) (M62.81);Difficulty in walking,  not elsewhere classified (R26.2);Pain Pain - Right/Left: Right(L thigh) Pain - part of body: Arm     Time: 2836-6294 PT Time Calculation (min) (ACUTE ONLY): 48 min  Charges:  $Therapeutic Activity: 8-22 mins                    Leitha Bleak, PT 07/27/19, 3:54 PM [phone]

## 2019-07-28 LAB — CBC WITH DIFFERENTIAL/PLATELET
Abs Immature Granulocytes: 0.04 10*3/uL (ref 0.00–0.07)
Basophils Absolute: 0.1 10*3/uL (ref 0.0–0.1)
Basophils Relative: 2 %
Eosinophils Absolute: 0.2 10*3/uL (ref 0.0–0.5)
Eosinophils Relative: 3 %
HCT: 33.5 % — ABNORMAL LOW (ref 36.0–46.0)
Hemoglobin: 9.6 g/dL — ABNORMAL LOW (ref 12.0–15.0)
Immature Granulocytes: 1 %
Lymphocytes Relative: 27 %
Lymphs Abs: 1.7 10*3/uL (ref 0.7–4.0)
MCH: 27.8 pg (ref 26.0–34.0)
MCHC: 28.7 g/dL — ABNORMAL LOW (ref 30.0–36.0)
MCV: 97.1 fL (ref 80.0–100.0)
Monocytes Absolute: 0.4 10*3/uL (ref 0.1–1.0)
Monocytes Relative: 7 %
Neutro Abs: 3.9 10*3/uL (ref 1.7–7.7)
Neutrophils Relative %: 60 %
Platelets: 278 10*3/uL (ref 150–400)
RBC: 3.45 MIL/uL — ABNORMAL LOW (ref 3.87–5.11)
RDW: 17.3 % — ABNORMAL HIGH (ref 11.5–15.5)
WBC: 6.4 10*3/uL (ref 4.0–10.5)
nRBC: 0 % (ref 0.0–0.2)

## 2019-07-28 LAB — BASIC METABOLIC PANEL
Anion gap: 7 (ref 5–15)
BUN: 36 mg/dL — ABNORMAL HIGH (ref 8–23)
CO2: 22 mmol/L (ref 22–32)
Calcium: 8.4 mg/dL — ABNORMAL LOW (ref 8.9–10.3)
Chloride: 111 mmol/L (ref 98–111)
Creatinine, Ser: 0.96 mg/dL (ref 0.44–1.00)
GFR calc Af Amer: >60 mL/min (ref >60)
GFR calc non Af Amer: >60 mL/min (ref >60)
Glucose, Bld: 80 mg/dL (ref 70–99)
Potassium: 3.9 mmol/L (ref 3.5–5.1)
Sodium: 140 mmol/L (ref 135–145)

## 2019-07-28 MED ORDER — BUMETANIDE 0.5 MG PO TABS
0.5000 mg | ORAL_TABLET | Freq: Every day | ORAL | Status: DC
  Administered 2019-07-28 – 2019-08-02 (×6): 0.5 mg via ORAL
  Filled 2019-07-28 (×6): qty 1

## 2019-07-28 NOTE — Plan of Care (Signed)
°  Problem: Clinical Measurements: °Goal: Respiratory complications will improve °Outcome: Progressing °  °Problem: Pain Managment: °Goal: General experience of comfort will improve °Outcome: Progressing °  °Problem: Safety: °Goal: Ability to remain free from injury will improve °Outcome: Progressing °  °

## 2019-07-28 NOTE — Progress Notes (Signed)
[facility] [place] [facility]   PATIENT NAME: Regina White    MR#:  [id number]  DATE OF BIRTH:  09/18/1956  SUBJECTIVE:  CHIEF COMPLAINT:   Chief Complaint  Patient presents with  . Hypotension   Pt had recent venous thrombosis and then hematoma- recent long stay at [facility] and sent to rehab last week, not much mobile there. Sent to hospital due to hypotension. Husband in room, Mains very weak, finally off Levophed Patient was sent to the floor from ICU on 07/27/2019 Reporting severe weakness but prefers going home  REVIEW OF SYSTEMS:  CONSTITUTIONAL: fatigue or weakness. No fever, EYES: No blurred or double vision.  EARS, NOSE, AND THROAT: No tinnitus or ear pain.  RESPIRATORY: No cough, shortness of breath, wheezing or hemoptysis.  CARDIOVASCULAR: No chest pain, orthopnea, edema.  GASTROINTESTINAL: No nausea, vomiting, diarrhea or abdominal pain.  GENITOURINARY: No dysuria, hematuria.  ENDOCRINE: No polyuria, nocturia,  HEMATOLOGY: No anemia, easy bruising or bleeding SKIN: No rash or lesion. MUSCULOSKELETAL: No joint pain or arthritis.   NEUROLOGIC: No tingling, numbness, weakness.  PSYCHIATRY: No anxiety or depression.   ROS  DRUG ALLERGIES:   Allergies  Allergen Reactions  . Penicillins Anaphylaxis    BREATHING PROBLEMS  Patient tolerated Ceftriaxone September 2020  . Erythromycin Nausea And Vomiting and Rash    STOMACH PAIN  . Levofloxacin     Other reaction(s): Other MUSCLE ACHES  . Lidocaine Rash  . Clindamycin     Recurrent C diff  . Levothyroxine Other (See Comments)    Vaginal bleeding Patient used BRAND ONLY SYNTHROID  . Tiotropium Other (See Comments)    Coughing up blood  . Doxycycline Nausea And Vomiting  . Sulfa Antibiotics Rash    VITALS:  Blood pressure 107/83, pulse 83, temperature 97.9 F (36.6 C), temperature source Oral, resp. rate 18, height 5\' 5"  (1.651 m), weight 104.2 kg, SpO2 98 %.  PHYSICAL EXAMINATION:   GENERAL:  63 y.o.-year-old patient lying in the bed with no acute distress.  EYES: Pupils equal, round, reactive to light and accommodation. No scleral icterus. Extraocular muscles intact.  HEENT: Head atraumatic, normocephalic. Oropharynx and nasopharynx clear.  NECK:  Supple, no jugular venous distention. No thyroid enlargement, no tenderness.  LUNGS: Normal breath sounds bilaterally, no wheezing, rales,rhonchi or crepitation. No use of accessory muscles of respiration.  CARDIOVASCULAR: S1, S2 normal. No murmurs, rubs, or gallops.  ABDOMEN: Soft, nontender, nondistended. Bowel sounds present. No organomegaly or mass.  EXTREMITIES: No pedal edema, cyanosis, or clubbing.  NEUROLOGIC: Cranial nerves II through XII are intact. Muscle strength 3-4/5 in all extremities. Sensation intact. Gait not checked.  PSYCHIATRIC: The patient is alert and oriented x 3.  SKIN: No obvious rash, lesion, or ulcer.   Physical Exam LABORATORY PANEL:   CBC Recent Labs  Lab 07/28/19 0440  WBC 6.4  HGB 9.6*  HCT 33.5*  PLT 278   ------------------------------------------------------------------------------------------------------------------  Chemistries  Recent Labs  Lab 07/23/19 0523  07/27/19 1827 07/28/19 0440  NA 138   < >  --  140  K 5.0   < > 4.1 3.9  CL 107   < >  --  111  CO2 23   < >  --  22  GLUCOSE 140*   < >  --  80  BUN 55*   < >  --  36*  CREATININE 1.68*   < >  --  0.96  CALCIUM 8.2*   < >  --  8.4*  MG  --   --  1.8  --   AST 19  --   --   --   ALT 16  --   --   --   ALKPHOS 101  --   --   --   BILITOT 0.9  --   --   --    < > = values in this interval not displayed.   ------------------------------------------------------------------------------------------------------------------  Cardiac Enzymes No results for input(s): TROPONINI in the last 168  hours. ------------------------------------------------------------------------------------------------------------------  RADIOLOGY:  No results found.  ASSESSMENT AND PLAN:   Active Problems:   Hypovolemic shock (HCC)  1.  Hypovolemic/septic shock with acute kidney injury: Patient has purposely not taking in adequate amounts of fluid which has led to acute kidney injury and profound hypotension.  She has also been  receiving her hypertensive medications at the facility while having decreased oral intake which has decreased her BP significantly. Hemoglobin is stable.  There is no evidence of hemorrhagic shock.   COVID negative UTI- ur cx with ecoli-finished Abx. - off pressors  -Clinically  improving  2.  Acute kidney injury with prerenal azotemia: Resolved with hydration  3.  History of VTE: Hematology recommends apixaban  4.  Chronic diastolic heart failure: Well compensated at this time, once blood pressure stabilizes can consider resuming beta-blocker, Entresto  Resume Bumex patient is reporting tightness in her extremities  5.  Chronic hypoxic respiratory failure with underlying history of COPD/obstructive sleep apnea: Stable at this time  6.  Hypotension with a history of hypertension: Holding blood pressure medicine  7.  Chronic pain managed with morphine pump  8.  Hypothyroidism: Continue Synthroid  9.  Peripheral arterial disease: Monitor on Eliquis - recent stenting in the left leg  10.  Weakness: Multifactorial -PT and OT recommends skilled nursing facility .  Patient prefers going home with home health     All the records are reviewed and case discussed with Care Management/Social Workerr. Management plans discussed with the patient, husband at bedside and they are in agreement.  CODE STATUS: Full.  TOTAL TIME TAKING CARE OF THIS PATIENT: 25 minutes.    POSSIBLE D/C IN 1-2 DAYS, DEPENDING ON CLINICAL CONDITION.   Nicholes Mango M.D on 07/28/2019    Between 7am to 6pm - Pager - [phone]   After 6pm go to [email or web address] - password [email or web address] [facility]  Office  [phone]  CC: Primary care physician; Jacklynn Barnacle, MD  Note: This dictation was prepared with Dragon dictation along with smaller phrase technology. Any transcriptional errors that result from this process are unintentional.

## 2019-07-29 LAB — BASIC METABOLIC PANEL
Anion gap: 8 (ref 5–15)
BUN: 30 mg/dL — ABNORMAL HIGH (ref 8–23)
CO2: 24 mmol/L (ref 22–32)
Calcium: 8.8 mg/dL — ABNORMAL LOW (ref 8.9–10.3)
Chloride: 107 mmol/L (ref 98–111)
Creatinine, Ser: 0.83 mg/dL (ref 0.44–1.00)
GFR calc Af Amer: >60 mL/min (ref >60)
GFR calc non Af Amer: >60 mL/min (ref >60)
Glucose, Bld: 121 mg/dL — ABNORMAL HIGH (ref 70–99)
Potassium: 3.9 mmol/L (ref 3.5–5.1)
Sodium: 139 mmol/L (ref 135–145)

## 2019-07-29 LAB — MAGNESIUM: Magnesium: 1.8 mg/dL (ref 1.7–2.4)

## 2019-07-29 NOTE — Progress Notes (Signed)
[place] [facility] [place] NAME: Regina White    MR#:  [id number]  DATE OF BIRTH:  July 25, 1956  SUBJECTIVE:  CHIEF COMPLAINT:   Chief Complaint  Patient presents with  . Hypotension   Pt had recent venous thrombosis and then hematoma- recent long stay at [facility] and sent to rehab last week, not much mobile there. Sent to hospital due to hypotension. Husband in room, Mains very weak, finally off Levophed Patient was sent to the floor from ICU on 07/27/2019 Reporting severe weakness but prefers going home, awaiting for PT to reassess her   REVIEW OF SYSTEMS:  CONSTITUTIONAL: fatigue or weakness. No fever, EYES: No blurred or double vision.  EARS, NOSE, AND THROAT: No tinnitus or ear pain.  RESPIRATORY: No cough, shortness of breath, wheezing or hemoptysis.  CARDIOVASCULAR: No chest pain, orthopnea, edema.  GASTROINTESTINAL: No nausea, vomiting, diarrhea or abdominal pain.  GENITOURINARY: No dysuria, hematuria.  ENDOCRINE: No polyuria, nocturia,  HEMATOLOGY: No anemia, easy bruising or bleeding SKIN: No rash or lesion. MUSCULOSKELETAL: No joint pain or arthritis.   NEUROLOGIC: No tingling, numbness, weakness.  PSYCHIATRY: No anxiety or depression.   ROS  DRUG ALLERGIES:   Allergies  Allergen Reactions  . Penicillins Anaphylaxis    BREATHING PROBLEMS  Patient tolerated Ceftriaxone September 2020  . Erythromycin Nausea And Vomiting and Rash    STOMACH PAIN  . Levofloxacin     Other reaction(s): Other MUSCLE ACHES  . Lidocaine Rash  . Clindamycin     Recurrent C diff  . Levothyroxine Other (See Comments)    Vaginal bleeding Patient used BRAND ONLY SYNTHROID  . Tiotropium Other (See Comments)    Coughing up blood  . Doxycycline Nausea And Vomiting  . Sulfa Antibiotics Rash    VITALS:  Blood pressure 126/76, pulse 78, temperature 98.6 F (37 C), temperature source Oral, resp. rate 18, height 5\' 5"  (1.651 m), weight 103.2 kg, SpO2  92 %.  PHYSICAL EXAMINATION:  GENERAL:  63 y.o.-year-old patient lying in the bed with no acute distress.  EYES: Pupils equal, round, reactive to light and accommodation. No scleral icterus. Extraocular muscles intact.  HEENT: Head atraumatic, normocephalic. Oropharynx and nasopharynx clear.  NECK:  Supple, no jugular venous distention. No thyroid enlargement, no tenderness.  LUNGS: Normal breath sounds bilaterally, no wheezing, rales,rhonchi or crepitation. No use of accessory muscles of respiration.  CARDIOVASCULAR: S1, S2 normal. No murmurs, rubs, or gallops.  ABDOMEN: Soft, nontender, nondistended. Bowel sounds present.  EXTREMITIES: No pedal edema, cyanosis, or clubbing.  NEUROLOGIC: Cranial nerves II through XII are intact. Muscle strength 3-4/5 in all extremities. Sensation intact. Gait not checked.  PSYCHIATRIC: The patient is alert and oriented x 3.  SKIN: No obvious rash, lesion, or ulcer.   Physical Exam LABORATORY PANEL:   CBC Recent Labs  Lab 07/28/19 0440  WBC 6.4  HGB 9.6*  HCT 33.5*  PLT 278   ------------------------------------------------------------------------------------------------------------------  Chemistries  Recent Labs  Lab 07/23/19 0523  07/27/19 1827 07/28/19 0440  NA 138   < >  --  140  K 5.0   < > 4.1 3.9  CL 107   < >  --  111  CO2 23   < >  --  22  GLUCOSE 140*   < >  --  80  BUN 55*   < >  --  36*  CREATININE 1.68*   < >  --  0.96  CALCIUM 8.2*   < >  --  8.4*  MG  --   --  1.8  --   AST 19  --   --   --   ALT 16  --   --   --   ALKPHOS 101  --   --   --   BILITOT 0.9  --   --   --    < > = values in this interval not displayed.   ------------------------------------------------------------------------------------------------------------------  Cardiac Enzymes No results for input(s): TROPONINI in the last 168  hours. ------------------------------------------------------------------------------------------------------------------  RADIOLOGY:  No results found.  ASSESSMENT AND PLAN:   Active Problems:   Hypovolemic shock (HCC)  1.  Hypovolemic/septic shock with acute kidney injury: Patient has purposely not taking in adequate amounts of fluid which has led to acute kidney injury and profound hypotension.  She has also been  receiving her hypertensive medications at the facility while having decreased oral intake which has decreased her BP significantly. Hemoglobin is stable.  There is no evidence of hemorrhagic shock.   COVID negative UTI- ur cx with ecoli-finished Abx. - off pressors  -Clinically  Stable and shock resolved  2.  Acute kidney injury with prerenal azotemia: Resolved with hydration  3.  History of VTE: Hematology recommends apixaban  4.  Chronic diastolic heart failure: Well compensated at this time, once blood pressure stabilizes can consider resuming beta-blocker, Entresto  Resume Bumex patient is reporting tightness in her extremities  5.  Chronic hypoxic respiratory failure with underlying history of COPD/obstructive sleep apnea: Stable at this time  6.  Hypotension with a history of hypertension: Holding blood pressure medicine  7.  Chronic pain managed with morphine pump  8.  Hypothyroidism: Continue Synthroid  9.  Peripheral arterial disease: Monitor on Eliquis - recent stenting in the left leg  10.  Weakness: Multifactorial -PT and OT recommends skilled nursing facility/CIR .  Patient prefers going home with home health  f/u with SW   All the records are reviewed and case discussed with Care Management/Social Workerr. Management plans discussed with the patient, husband at bedside and they are in agreement.  CODE STATUS: Full.  TOTAL TIME TAKING CARE OF THIS PATIENT: 25 minutes.    POSSIBLE D/C IN 1-2 DAYS, DEPENDING ON CLINICAL  CONDITION.   Ramonita LabAruna Vietta Bonifield M.D on 07/29/2019   Between 7am to 6pm - Pager - [phone]   After 6pm go to [email or web address] - password [email or web address]  [facility]  Office  [phone]  CC: Primary care physician; Cain SieveKlipstein, Christopher, MD  Note: This dictation was prepared with Dragon dictation along with smaller phrase technology. Any transcriptional errors that result from this process are unintentional.

## 2019-07-29 NOTE — Progress Notes (Addendum)
Per ccmd pt had 9 beat run of v-tach, pt asymptomatic, v/s stable, Dr. Margaretmary Eddy notified. Verbal orders for BMP received, will continue to monitor.

## 2019-07-30 NOTE — Progress Notes (Signed)
Physical Therapy Treatment Patient Details Name: Regina White MRN: [id number] DOB: 07-23-1956 Today's Date: 07/30/2019    History of Present Illness Pt is a 63 y.o. female presenting to hospital 07/22/19 with hypotension.  Admitted with hypovolemic shock with acute kidney injury, acute kidney injury with pre-renal azotomia, h/o VTE, and HFrEF.  Recent h/o R UE edematous and bruised (hematoma).  Pt with recent admit to [facility] for hemorrhagic shock d/t acute blood loss anemia in setting of supratherapeutic Lovenox after recent vascular stent procedure (pt has been at [facility] since 9/10).  PMH includes active morphine pump L abdomen, asthma, CHF, COPD, pulmonary htn, chronic back pain, h/o back surgery, PAD, recurent c-diff infections.    PT Comments    Pt seen in afternoon for transfer back to bed.  Mod to max assist x2 lateral scoot recliner to bed towards L side slightly uphill with L armrest removed; vc's for technique and use of bedrail with L UE to assist.  2 assist to perform sit to supine.  Nursing staff present end of session to assist pt with cleaning up (d/t pt was wet from purewick not working well in chair).  Will continue to focus on strengthening and progressive functional mobility.   Follow Up Recommendations  CIR     Equipment Recommendations  Rolling walker with 5" wheels;3in1 (PT);Wheelchair (measurements PT);Wheelchair cushion (measurements PT);Hospital bed;Other (comment)    Recommendations for Other Services OT consult     Precautions / Restrictions Precautions Precautions: Fall Precaution Comments: R UE sling for OOB mobility (sling in pt's room); monitor BP (MAP >55 and as long as mentation WDL) Restrictions RUE Weight Bearing: Weight bearing as tolerated Other Position/Activity Restrictions: Discussed bedrest with bathroom privileges order with MD Elisabeth Pigeon 07/24/19 who cleared pt for OOB mobility (RN cleared pt for OOB mobility 07/30/19)    Mobility  Bed  Mobility Overal bed mobility: Needs Assistance Bed Mobility: Sit to Supine       Sit to supine: Mod assist;+2 for physical assistance;HOB elevated   General bed mobility comments: assist for B LE's and trunk and to scoot up in bed  Transfers Overall transfer level: Needs assistance Equipment used: None Transfers: Lateral/Scoot Transfers          Lateral/Scoot Transfers: Mod assist;Max assist;+2 physical assistance General transfer comment: multiple trials to scoot to L (slightly uphill) recliner to bed with use of bed rail and L armrest of recliner lowered; vc's for technique and L knee blocked; assist for B LE placement  Ambulation/Gait             General Gait Details: not appropriate at this time   Stairs             Wheelchair Mobility    Modified Rankin (Stroke Patients Only)       Balance Overall balance assessment: Needs assistance Sitting-balance support: No upper extremity supported;Feet supported Sitting balance-Leahy Scale: Fair Sitting balance - Comments: pt tending to lean towards R side (R UE in sling for comfort); L UE support on bed Postural control: Right lateral lean                                  Cognition Arousal/Alertness: Awake/alert Behavior During Therapy: WFL for tasks assessed/performed Overall Cognitive Status: Within Functional Limits for tasks assessed  Exercises      General Comments        Pertinent Vitals/Pain Pain Assessment: 0-10 Pain Score: 6  Pain Location: R UE and L thigh Pain Descriptors / Indicators: Sore;Tender;Burning;Throbbing Pain Intervention(s): Limited activity within patient's tolerance;Monitored during session;Repositioned     Home Living                      Prior Function            PT Goals (current goals can now be found in the care plan section) Acute Rehab PT Goals Patient Stated Goal: to do as much as  she can to get better, be able to walk again PT Goal Formulation: With patient Time For Goal Achievement: 08/07/19 Potential to Achieve Goals: Fair Progress towards PT goals: Progressing toward goals    Frequency    7X/week      PT Plan Current plan remains appropriate    Co-evaluation              AM-PAC PT "6 Clicks" Mobility   Outcome Measure  Help needed turning from your back to your side while in a flat bed without using bedrails?: A Lot Help needed moving from lying on your back to sitting on the side of a flat bed without using bedrails?: A Lot Help needed moving to and from a bed to a chair (including a wheelchair)?: Total Help needed standing up from a chair using your arms (e.g., wheelchair or bedside chair)?: Total Help needed to walk in hospital room?: Total Help needed climbing 3-5 steps with a railing? : Total 6 Click Score: 8    End of Session Equipment Utilized During Treatment: Gait belt;Oxygen(1 L O2 via nasal cannula) Activity Tolerance: Patient tolerated treatment well Patient left: in bed;with nursing/sitter in room(nursing present cleaning pt up (d/t pt wet from [place] not working properly): nursing to set pt up when done) Nurse Communication: Mobility status;Precautions PT Visit Diagnosis: Other abnormalities of gait and mobility (R26.89);Muscle weakness (generalized) (M62.81);Difficulty in walking, not elsewhere classified (R26.2);Pain Pain - Right/Left: Right(L thigh) Pain - part of body: Arm     Time: 1610-9604 PT Time Calculation (min) (ACUTE ONLY): 16 min  Charges:  $Therapeutic Activity: 8-22 mins                     Leitha Bleak, PT 07/30/19, 4:53 PM [phone]

## 2019-07-30 NOTE — TOC Progression Note (Signed)
Transition of Care [facility]) - Progression Note    Patient Details  Name: Regina White MRN: [id number] Date of Birth: 06/01/1956  Transition [facility] Care [facility]) CM/SW Contact  Elza Rafter, RN Phone Number: 07/30/2019, 1:56 PM  Clinical Narrative:   Hulen Skains and spoke with Claiborne Billings from [facility] asking her to screen patient.  Patient would really like CIR at discharge.  Will continue to follow.     Expected Discharge Plan: [place] Barriers to Discharge: Continued Medical Work up  Expected Discharge Plan and Services Expected Discharge Plan: [place] arrangements for the past 2 months: [place], Single Family Home                                       Social Determinants of Health (SDOH) Interventions    Readmission Risk Interventions No flowsheet data found.

## 2019-07-30 NOTE — NC FL2 (Signed)
[place] LEVEL OF CARE SCREENING TOOL     IDENTIFICATION  Patient Name: Regina White Birthdate: 06-Jul-1956 Sex: female Admission Date (Current Location): 07/22/2019  [place] and [place] Number:  Engineering geologist and Address:  [facility], [address], [place], [place] [address]      Provider Number: [id number]  Attending Physician Name and Address:  Nicholes Mango, MD  Relative Name and Phone Number:  Christella App- husband    Current Level of Care: SNF Recommended Level of Care: [place] Prior Approval Number:    Date Approved/Denied:   PASRR Number: [id number]  Discharge Plan: SNF    Current Diagnoses: Patient Active Problem List   Diagnosis Date Noted  . Hypovolemic shock ([place]) 07/22/2019  . Acute respiratory failure with hypoxemia ([place]) 12/04/2018  . Acute respiratory failure with hypoxia ([place]) 06/02/2018  . Diarrhea of presumed infectious origin 02/17/2016  . Right acute serous otitis media 02/17/2016  . COPD (chronic obstructive pulmonary disease) ([place]) 12/23/2015  . Heart failure with preserved ejection fraction ([place]) 12/23/2015  . DDD (degenerative disc disease) 09/25/2013  . Narrowing of intervertebral disc space 09/25/2013  . OSA (obstructive sleep apnea) 07/25/2013  . Deep vein thrombosis (DVT) ([place]) 06/25/2013  . Erythema 06/18/2013  . Left leg pain 06/18/2013  . Mediastinal lymphadenopathy 06/18/2013  . DVT, lower extremity ([place]) 06/14/2013  . Pulmonary hypertension ([place]) 06/14/2013  . Asthma 09/21/2010  . Chronic pain 09/21/2010  . Hypertension, benign 09/21/2010  . Hypothyroidism 09/21/2010  . Venous stasis 09/21/2010    Orientation RESPIRATION BLADDER Height & Weight     Self, Time, Situation, Place  O2(5 liters) Continent Weight: 103.2 kg Height:  5\' 5"  (165.1 cm)  BEHAVIORAL SYMPTOMS/MOOD NEUROLOGICAL BOWEL NUTRITION STATUS      Continent Diet  AMBULATORY STATUS  COMMUNICATION OF NEEDS Skin   Extensive Assist Verbally Normal                       Personal Care Assistance Level of Assistance  Bathing, Feeding Bathing Assistance: Limited assistance Feeding assistance: Independent Dressing Assistance: Limited assistance     Functional Limitations Info  Sight, Hearing, Speech Sight Info: Adequate Hearing Info: Adequate Speech Info: Adequate    SPECIAL CARE FACTORS FREQUENCY  PT (By licensed PT)     PT Frequency: 5 X week OT Frequency: 5 X week            Contractures Contractures Info: Not present    Additional Factors Info  Code Status, Allergies Code Status Info: Full Allergies Info: Penicillins, Erythromycin, Levofloxacin, Lidocaine, Clindamycin, Levothyroxine, Tiotropium, Doxycycline, Sulfa Antibiotics           Current Medications (07/30/2019):  This is the current hospital active medication list Current Facility-Administered Medications  Medication Dose Route Frequency Provider Last Rate Last Dose  . acetaminophen (TYLENOL) tablet 650 mg  650 mg Oral Q6H PRN Bettey Costa, MD   650 mg at 07/25/19 0012   Or  . acetaminophen (TYLENOL) suppository 650 mg  650 mg Rectal Q6H PRN Bettey Costa, MD      . acidophilus (RISAQUAD) capsule 2 capsule  2 capsule Oral TID AC Flora Lipps, MD   2 capsule at 07/30/19 1142  . apixaban (ELIQUIS) tablet 2.5 mg  2.5 mg Oral BID Bettey Costa, MD   2.5 mg at 07/30/19 0759  . bumetanide (BUMEX) tablet 0.5 mg  0.5 mg Oral Daily Gouru, Aruna, MD   0.5 mg  at 07/30/19 0803  . fentaNYL (SUBLIMAZE) injection 25-50 mcg  25-50 mcg Intravenous Q4H PRN Eugenie Norrie, NP      . gabapentin (NEURONTIN) capsule 100 mg  100 mg Oral BID Tukov-Yual, Magdalene S, NP   100 mg at 07/30/19 0758  . HYDROcodone-acetaminophen (NORCO/VICODIN) 5-325 MG per tablet 1-2 tablet  1-2 tablet Oral Q4H PRN Adrian Saran, MD   2 tablet at 07/30/19 1233  . influenza vac split quadrivalent PF (FLUARIX) injection 0.5 mL  0.5 mL  Intramuscular Tomorrow-1000 Merwyn Katos, MD      . levothyroxine (SYNTHROID) tablet 200 mcg  200 mcg Oral [id number] Adrian Saran, MD   200 mcg at 07/30/19 0523  . liothyronine (CYTOMEL) tablet 25 mcg  25 mcg Oral Daily Adrian Saran, MD   25 mcg at 07/30/19 0759  . MEDLINE mouth rinse  15 mL Mouth Rinse BID Merwyn Katos, MD   15 mL at 07/29/19 0844  . midodrine (PROAMATINE) tablet 10 mg  10 mg Oral TID WC Erin Fulling, MD   10 mg at 07/30/19 1142  . mometasone-formoterol (DULERA) 200-5 MCG/ACT inhaler 2 puff  2 puff Inhalation BID Erin Fulling, MD   2 puff at 07/30/19 0758  . ondansetron (ZOFRAN) tablet 4 mg  4 mg Oral Q6H PRN Adrian Saran, MD       Or  . ondansetron (ZOFRAN) injection 4 mg  4 mg Intravenous Q6H PRN Mody, Sital, MD      . pneumococcal 23 valent vaccine (PNU-IMMUNE) injection 0.5 mL  0.5 mL Intramuscular Tomorrow-1000 Harlon Ditty D, NP      . polyethylene glycol (MIRALAX / GLYCOLAX) packet 17 g  17 g Oral Daily PRN Adrian Saran, MD         Discharge Medications: Please see discharge summary for a list of discharge medications.  Relevant Imaging Results:  Relevant Lab Results:   Additional Information SSN: [id number]  Sherren Kerns, RN

## 2019-07-30 NOTE — TOC Progression Note (Signed)
Transition of Care [facility]) - Progression Note    Patient Details  Name: Regina White MRN: [id number] Date of Birth: 20-Apr-1956  [facility]) CM/SW Contact  Elza Rafter, RN Phone Number: 07/30/2019, 3:25 PM  Clinical Narrative:   CIR cannot accept patient.  Patient and spouse aware.  Permission given to look into [facility] inpatient rehab and to send out other bed offers for SNF.  This RNCM left message with [facility] inpatient rehab; waiting on call back.     Expected Discharge Plan: [place] Barriers to Discharge: Continued Medical Work up  Expected Discharge Plan and Services Expected Discharge Plan: [place] arrangements for the past 2 months: [place], Single Family Home                                       Social Determinants of Health (SDOH) Interventions    Readmission Risk Interventions No flowsheet data found.

## 2019-07-30 NOTE — Progress Notes (Signed)
Occupational Therapy Treatment Patient Details Name: Regina White MRN: [id number] DOB: 06-19-1956 Today's Date: 07/30/2019    History of present illness Pt is a 63 y.o. female presenting to hospital 07/22/19 with hypotension.  Admitted with hypovolemic shock with acute kidney injury, acute kidney injury with pre-renal azotomia, h/o VTE, and HFrEF.  Recent h/o R UE edematous and bruised (hematoma).  Pt with recent admit to [facility] for hemorrhagic shock d/t acute blood loss anemia in setting of supratherapeutic Lovenox after recent vascular stent procedure (pt has been at [facility] since 9/10).  PMH includes active morphine pump L abdomen, asthma, CHF, COPD, pulmonary htn, chronic back pain, h/o back surgery, PAD, recurent c-diff infections.   OT comments  Pt seen for co-tx with PT this date. Pt very eager to participate, spouse present and supportive throughout session. Pt instructed in functional transfer training with STS and lateral/scoot transfers requiring multiple attempts to get from EOB to recliner. Pt progressing towards goals. Pt continues to have RUE pain and decreased functional use 2/2 RUE pain; in sling for comfort.  Therapists educated pt and pt's husband on current assist levels, functional status, and therapy needs: pt tearful but appearing with good understanding.  MAP 66-74 during session (see BP in vitals below).  Pt would benefit from CIR to improve strength and functional mobility for eventual safe discharge home with support of husband.   Follow Up Recommendations  CIR    Equipment Recommendations  3 in 1 bedside commode;Wheelchair (measurements OT);Wheelchair cushion (measurements OT);Hospital bed;Other (comment)(hoyer lift)    Recommendations for Other Services      Precautions / Restrictions Precautions Precautions: Fall Precaution Comments: R UE sling for OOB mobility (sling in pt's room); monitor BP (MAP >55 and as long as mentation WDL) Restrictions Weight  Bearing Restrictions: No RUE Weight Bearing: Weight bearing as tolerated Other Position/Activity Restrictions: Discussed bedrest with bathroom privileges order with MD Anselm Jungling 07/24/19 who cleared pt for OOB mobility (RN cleared pt for OOB mobility 07/30/19)       Mobility Bed Mobility Overal bed mobility: Needs Assistance Bed Mobility: Supine to Sit     Supine to sit: Mod assist;HOB elevated Sit to supine: Mod assist;+2 for physical assistance;HOB elevated   General bed mobility comments: assist for L>R LE (d/t L thigh pain) and assist for trunk semi-supine to sit; vc's for technique and use of L UE to assist via side rail; assist to scoot to edge of bed via bed sheet  Transfers Overall transfer level: Needs assistance Equipment used: None Transfers: Sit to/from Stand;Lateral/Scoot Transfers Sit to Stand: Mod assist;+2 physical assistance        Lateral/Scoot Transfers: Mod assist;Max assist;+2 physical assistance General transfer comment: pt unable to stand 1st trial from bed with 2 assist but able to stand 2nd trial with mod assist x2 (L UE support on bed rail; B knees blocked; assist for B LE placement; B LE's pushing against bed to stabilize); lateral scoot to R bed to recliner with armrest lowered (3 total trials to get over to chair with use of bed sheet) with B knees blocked and mod to max assist x2    Balance Overall balance assessment: Needs assistance Sitting-balance support: No upper extremity supported;Feet supported Sitting balance-Leahy Scale: Fair Sitting balance - Comments: pt tending to lean towards R side (R UE in sling for comfort); L UE support on bed but feels more comfortable holding onto bedrail with L UE Postural control: Right lateral lean   Standing balance-Leahy  Scale: Poor Standing balance comment: pt pushing B LE's against bed to maintain standing with support of 2 therapists                           ADL either performed or assessed with  clinical judgement   ADL                                         General ADL Comments: Mod-Max A for bathing, dressing, toileting at seated EOB level     Vision Baseline Vision/History: Wears glasses Wears Glasses: At all times Patient Visual Report: No change from baseline     Perception     Praxis      Cognition Arousal/Alertness: Awake/alert Behavior During Therapy: WFL for tasks assessed/performed Overall Cognitive Status: Within Functional Limits for tasks assessed                                 General Comments: very motivated to participate; tearful end of session d/t current status        Exercises  Other Exercises Other Exercises: functional transfer training   Shoulder Instructions       General Comments      Pertinent Vitals/ Pain       Pain Assessment: 0-10 Pain Score: 6  Pain Location: R UE and L thigh Pain Descriptors / Indicators: Sore;Tender;Burning;Throbbing Pain Intervention(s): Limited activity within patient's tolerance;Monitored during session;Premedicated before session;Repositioned  Home Living                                          Prior Functioning/Environment              Frequency  Min 3X/week        Progress Toward Goals  OT Goals(current goals can now be found in the care plan section)  Progress towards OT goals: Progressing toward goals  Acute Rehab OT Goals Patient Stated Goal: to do as much as she can to get better, be able to walk again OT Goal Formulation: With patient/family Time For Goal Achievement: 08/08/19 Potential to Achieve Goals: Good  Plan Discharge plan needs to be updated;Frequency needs to be updated    Co-evaluation    PT/OT/SLP Co-Evaluation/Treatment: Yes Reason for Co-Treatment: Complexity of the patient's impairments (multi-system involvement);For patient/therapist safety;To address functional/ADL transfers PT goals addressed during  session: Mobility/safety with mobility;Balance;Strengthening/ROM OT goals addressed during session: ADL's and self-care;Strengthening/ROM      AM-PAC OT "6 Clicks" Daily Activity     Outcome Measure   Help from another person eating meals?: None Help from another person taking care of personal grooming?: None Help from another person toileting, which includes using toliet, bedpan, or urinal?: A Lot Help from another person bathing (including washing, rinsing, drying)?: A Lot Help from another person to put on and taking off regular upper body clothing?: A Lot Help from another person to put on and taking off regular lower body clothing?: A Lot 6 Click Score: 16    End of Session Equipment Utilized During Treatment: Gait belt  OT Visit Diagnosis: Other abnormalities of gait and mobility (R26.89);Muscle weakness (generalized) (M62.81);Pain Pain - Right/Left: Right Pain - part of body:  Arm   Activity Tolerance Patient tolerated treatment well   Patient Left in chair;with call bell/phone within reach;with chair alarm set;with family/visitor present   Nurse Communication          Time: [id number] OT Time Calculation (min): 26 min  Charges: OT General Charges $OT Visit: 1 Visit OT Treatments $Therapeutic Activity: 8-22 mins  Richrd PrimeJamie Stiller, MPH, MS, OTR/L ascom [phone] 07/30/19, 1:01 PM

## 2019-07-30 NOTE — Progress Notes (Signed)
Rehab Admissions Coordinator Note:  Per therapy recommendations, patient was screened by Michel Santee for appropriateness for an Inpatient Acute Rehab Consult.  Pt does not have the medical necessity for hospital based rehab.  Would recommend SNF if pt does not feel she can manage at home with family support.   Michel Santee 07/30/2019, 1:57 PM  I can be reached at [phone].

## 2019-07-30 NOTE — Progress Notes (Signed)
Physical Therapy Treatment Patient Details Name: Regina White MRN: [id number] DOB: 08-Sep-1956 Today's Date: 07/30/2019    History of Present Illness Pt is a 63 y.o. female presenting to hospital 07/22/19 with hypotension.  Admitted with hypovolemic shock with acute kidney injury, acute kidney injury with pre-renal azotomia, h/o VTE, and HFrEF.  Recent h/o R UE edematous and bruised (hematoma).  Pt with recent admit [facility] for hemorrhagic shock d/t acute blood loss anemia in setting of supratherapeutic Lovenox after recent vascular stent procedure (pt has been at [facility] since 9/10).  PMH includes active morphine pump L abdomen, asthma, CHF, COPD, pulmonary htn, chronic back pain, h/o back surgery, PAD, recurent c-diff infections.    PT Comments    PT/OT co-treat 2nd half of therapy session.  Pt continues to be very motivated to participate with therapy.  Tolerated LE ex's in bed fairly well (assist for L LE d/t L thigh pain).  Able to sit onto edge of bed with mod assist.  Pt stood with mod assist x2 (on 2nd attempt) with L UE support on bed rail.  Able to progress to lateral scoot bed to recliner (to R) with mod to max assist x2.  R UE in sling for support/comfort d/t R UE pain.  Pt continues to make improvements with functional mobility and is very motivated to do as much as she can.  Therapists educated pt and pt's husband on current assist levels, functional status, and therapy needs: pt tearful but appearing with good understanding.  MAP 66-74 during session (see BP in vitals below).  Pt would benefit from CIR to improve strength and functional mobility for eventual safe discharge home with support of husband.   Follow Up Recommendations  CIR     Equipment Recommendations  Rolling walker with 5" wheels;3in1 (PT);Wheelchair (measurements PT);Wheelchair cushion (measurements PT);Hospital bed;Other (comment)(hoyer lift)    Recommendations for Other Services OT consult      Precautions / Restrictions Precautions Precautions: Fall Precaution Comments: R UE sling for OOB mobility (sling in pt's room); monitor BP (MAP >55 and as long as mentation WDL) Restrictions Weight Bearing Restrictions: No RUE Weight Bearing: Weight bearing as tolerated Other Position/Activity Restrictions: Discussed bedrest with bathroom privileges order with MD Regina White 07/24/19 who cleared pt for OOB mobility (RN cleared pt for OOB mobility 07/30/19)    Mobility  Bed Mobility Overal bed mobility: Needs Assistance Bed Mobility: Supine to Sit     Supine to sit: Mod assist;HOB elevated     General bed mobility comments: assist for L>R LE (d/t L thigh pain) and assist for trunk semi-supine to sit; vc's for technique and use of L UE to assist via side rail; assist to scoot to edge of bed via bed sheet  Transfers Overall transfer level: Needs assistance Equipment used: None Transfers: Sit to/from Stand;Lateral/Scoot Transfers Sit to Stand: Mod assist;+2 physical assistance        Lateral/Scoot Transfers: Mod assist;Max assist;+2 physical assistance General transfer comment: pt unable to stand 1st trial from bed with 2 assist but able to stand 2nd trial with mod assist x2 (L UE support on bed rail; B knees blocked; assist for B LE placement; B LE's pushing against bed to stabilize); lateral scoot to R bed to recliner with armrest lowered (3 total trials to get over to chair with use of bed sheet) with B knees blocked and mod to max assist x2  Ambulation/Gait  General Gait Details: not appropriate at this time   Stairs             Wheelchair Mobility    Modified Rankin (Stroke Patients Only)       Balance Overall balance assessment: Needs assistance Sitting-balance support: No upper extremity supported;Feet supported Sitting balance-Leahy Scale: Fair Sitting balance - Comments: pt tending to lean towards R side (R UE in sling for comfort); L UE support  on bed but feels more comfortable holding onto bedrail with L UE Postural control: Right lateral lean   Standing balance-Leahy Scale: Poor Standing balance comment: pt pushing B LE's against bed to maintain standing with support of 2 therapists                            Cognition Arousal/Alertness: Awake/alert Behavior During Therapy: WFL for tasks assessed/performed Overall Cognitive Status: Within Functional Limits for tasks assessed                                 General Comments: very motivated to participate; tearful end of session d/t current status      Exercises Total Joint Exercises Ankle Circles/Pumps: AROM;Strengthening;Both;10 reps;Supine Quad Sets: AROM;Strengthening;Both;10 reps;Supine Short Arc Quad: AROM;Right;AAROM;Left;Strengthening;10 reps;Supine Heel Slides: AROM;Right;AAROM;Left;Strengthening;10 reps;Supine Hip ABduction/ADduction: AROM;Right;AAROM;Left;Strengthening;10 reps;Supine    General Comments   Nursing cleared pt for participation in physical therapy.  Pt agreeable to PT session.  Pt's husband arrived during beginning of session and very supportive of pt.      Pertinent Vitals/Pain Pain Assessment: 0-10 Pain Score: 6  Pain Location: R UE and L thigh Pain Descriptors / Indicators: Sore;Tender;Burning;Throbbing Pain Intervention(s): Limited activity within patient's tolerance;Monitored during session;Premedicated before session;Repositioned  Vitals: Semi-supine in Bed BP 99/52 (MAP 66); sitting edge of bed BP 115/62 (MAP 74); in chair end of session BP 106/57 (MAP 72).    Home Living                      Prior Function            PT Goals (current goals can now be found in the care plan section) Acute Rehab PT Goals Patient Stated Goal: to do as much as she can to get better, be able to walk again PT Goal Formulation: With patient Time For Goal Achievement: 08/07/19 Potential to Achieve Goals:  Fair Progress towards PT goals: Progressing toward goals    Frequency    7X/week      PT Plan Discharge plan needs to be updated(CM notified)    Co-evaluation PT/OT/SLP Co-Evaluation/Treatment: Yes Reason for Co-Treatment: Complexity of the patient's impairments (multi-system involvement);For patient/therapist safety;To address functional/ADL transfers PT goals addressed during session: Mobility/safety with mobility;Balance;Strengthening/ROM OT goals addressed during session: ADL's and self-care;Strengthening/ROM      AM-PAC PT "6 Clicks" Mobility   Outcome Measure  Help needed turning from your back to your side while in a flat bed without using bedrails?: A Lot Help needed moving from lying on your back to sitting on the side of a flat bed without using bedrails?: A Lot Help needed moving to and from a bed to a chair (including a wheelchair)?: Total Help needed standing up from a chair using your arms (e.g., wheelchair or bedside chair)?: Total Help needed to walk in hospital room?: Total Help needed climbing 3-5 steps with a railing? : Total 6 Click Score:  8    End of Session Equipment Utilized During Treatment: Gait belt;Oxygen(1 L O2 via nasal cannula) Activity Tolerance: Patient tolerated treatment well Patient left: in chair;with call bell/phone within reach;with chair alarm set;with restraints reapplied Nurse Communication: Mobility status;Precautions PT Visit Diagnosis: Other abnormalities of gait and mobility (R26.89);Muscle weakness (generalized) (M62.81);Difficulty in walking, not elsewhere classified (R26.2);Pain Pain - Right/Left: Right(L thigh) Pain - part of body: Arm     Time: 1020-1115 PT Time Calculation (min) (ACUTE ONLY): 55 min  Charges:  $Therapeutic Exercise: 23-37 mins $Therapeutic Activity: 8-22 mins                     Leitha Bleak, PT 07/30/19, 12:01 PM [phone]

## 2019-07-30 NOTE — Progress Notes (Signed)
[facility] - [place] [facility]   PATIENT NAME: Regina White    MR#:  [id number]  DATE OF BIRTH:  May 27, 1956  SUBJECTIVE:  CHIEF COMPLAINT:   Chief Complaint  Patient presents with  . Hypotension   Pt had recent venous thrombosis and then hematoma- [facility] and sent to rehab last week, not much mobile there. Sent to hospital due to hypotension. Husband in room, Mains very weak, finally off Levophed Patient was sent to the floor from ICU on 07/27/2019 Reporting severe weakness ,AGREEABLE to  go to CIR  REVIEW OF SYSTEMS:  CONSTITUTIONAL: fatigue or weakness. No fever, EYES: No blurred or double vision.  EARS, NOSE, AND THROAT: No tinnitus or ear pain.  RESPIRATORY: No cough, shortness of breath, wheezing or hemoptysis.  CARDIOVASCULAR: No chest pain, orthopnea, edema.  GASTROINTESTINAL: No nausea, vomiting, diarrhea or abdominal pain.  GENITOURINARY: No dysuria, hematuria.  ENDOCRINE: No polyuria, nocturia,  HEMATOLOGY: No anemia, easy bruising or bleeding SKIN: No rash or lesion. MUSCULOSKELETAL: No joint pain or arthritis.   NEUROLOGIC: No tingling, numbness, weakness.  PSYCHIATRY: No anxiety or depression.   ROS  DRUG ALLERGIES:   Allergies  Allergen Reactions  . Penicillins Anaphylaxis    BREATHING PROBLEMS  Patient tolerated Ceftriaxone September 2020  . Erythromycin Nausea And Vomiting and Rash    STOMACH PAIN  . Levofloxacin     Other reaction(s): Other MUSCLE ACHES  . Lidocaine Rash  . Clindamycin     Recurrent C diff  . Levothyroxine Other (See Comments)    Vaginal bleeding Patient used BRAND ONLY SYNTHROID  . Tiotropium Other (See Comments)    Coughing up blood  . Doxycycline Nausea And Vomiting  . Sulfa Antibiotics Rash    VITALS:  Blood pressure (!) 111/54, pulse 72, temperature (!) 97.4 F (36.3 C), temperature source Oral, resp. rate 18, height 5\' 5"  (1.651 m), weight 103.2 kg, SpO2 91 %.  PHYSICAL  EXAMINATION:  GENERAL:  63 y.o.-year-old patient lying in the bed with no acute distress.  EYES: Pupils equal, round, reactive to light and accommodation. No scleral icterus. Extraocular muscles intact.  HEENT: Head atraumatic, normocephalic. Oropharynx and nasopharynx clear.  NECK:  Supple, no jugular venous distention. No thyroid enlargement, no tenderness.  LUNGS: Normal breath sounds bilaterally, no wheezing, rales,rhonchi or crepitation. No use of accessory muscles of respiration.  CARDIOVASCULAR: S1, S2 normal. No murmurs, rubs, or gallops.  ABDOMEN: Soft, nontender, nondistended. Bowel sounds present.  EXTREMITIES: No pedal edema, cyanosis, or clubbing.  NEUROLOGIC: Cranial nerves II through XII are intact. Muscle strength 3-4/5 in all extremities. Sensation intact. Gait not checked.  PSYCHIATRIC: The patient is alert and oriented x 3.  SKIN: No obvious rash, lesion, or ulcer.   Physical Exam LABORATORY PANEL:   CBC Recent Labs  Lab 07/28/19 0440  WBC 6.4  HGB 9.6*  HCT 33.5*  PLT 278   ------------------------------------------------------------------------------------------------------------------  Chemistries  Recent Labs  Lab 07/29/19 1527  NA 139  K 3.9  CL 107  CO2 24  GLUCOSE 121*  BUN 30*  CREATININE 0.83  CALCIUM 8.8*  MG 1.8   ------------------------------------------------------------------------------------------------------------------  Cardiac Enzymes No results for input(s): TROPONINI in the last 168 hours. ------------------------------------------------------------------------------------------------------------------  RADIOLOGY:  No results found.  ASSESSMENT AND PLAN:   Active Problems:   Hypovolemic shock (HCC)  1.  Hypovolemic/septic shock with acute kidney injury: Patient has purposely not taking in adequate amounts of fluid which has led to acute kidney  injury and profound hypotension.  She has also been  receiving her hypertensive  medications at the facility while having decreased oral intake which has decreased her BP significantly. Hemoglobin is stable.  There is no evidence of hemorrhagic shock.   COVID negative UTI- ur cx with ecoli-finished Abx. - off pressors  -Clinically  Stable and shock resolved  2.  Acute kidney injury with prerenal azotemia: Resolved with hydration  3.  History of VTE: Hematology recommends apixaban, continue Eliquis 2.5 mg p.o. twice daily  4.  Chronic diastolic heart failure: Well compensated at this time, once blood pressure stabilizes can consider resuming beta-blocker, Entresto  Resume Bumex patient is reporting tightness in her extremities  5.  Chronic hypoxic respiratory failure with underlying history of COPD/obstructive sleep apnea: Stable at this time  6.  Hypotension with a history of hypertension: Holding blood pressure medicine  7.  Chronic pain managed with morphine pump  8.  Hypothyroidism: Continue Synthroid  9.  Peripheral arterial disease: Monitor on Eliquis - recent stenting in the left leg  10.  Weakness: Multifactorial -PT and OT recommends CIR .  Patient is agreeable,f/u with SW  11.  Nonsustained V. tach possible remote telemetry [facility] reviewed  Tele strip did not find NSVtach other than PVCs, no new  recommendations as patient is back to normal sinus rhythm   All the records are reviewed and case discussed with Care Management/Social Workerr. Management plans discussed with the patient, husband at bedside and they are in agreement.  CODE STATUS: Full.  TOTAL TIME TAKING CARE OF THIS PATIENT: 25 minutes.    POSSIBLE D/C IN 1-2 DAYS, DEPENDING ON CLINICAL CONDITION.   Nicholes Mango M.D on 07/30/2019   Between 7am to 6pm - Pager - [phone]   After 6pm go to [email or web address] - password [email or web address] [facility] [place]  Office  [phone]  CC: Primary care physician; Jacklynn Barnacle, MD  Note: This dictation was prepared with  Dragon dictation along with smaller phrase technology. Any transcriptional errors that result from this process are unintentional.

## 2019-07-31 MED ORDER — ADULT MULTIVITAMIN W/MINERALS CH
1.0000 | ORAL_TABLET | Freq: Every day | ORAL | Status: DC
  Administered 2019-08-01 – 2019-08-02 (×2): 1 via ORAL
  Filled 2019-07-31 (×2): qty 1

## 2019-07-31 MED ORDER — PNEUMOCOCCAL VAC POLYVALENT 25 MCG/0.5ML IJ INJ
0.5000 mL | INJECTION | INTRAMUSCULAR | Status: AC
  Administered 2019-08-02: 0.5 mL via INTRAMUSCULAR
  Filled 2019-07-31: qty 0.5

## 2019-07-31 NOTE — Progress Notes (Signed)
Physical Therapy Treatment Patient Details Name: Regina White MRN: [id number] DOB: 07-Feb-1956 Today's Date: 07/31/2019    History of Present Illness Pt is a 63 y.o. female presenting to hospital 07/22/19 with hypotension.  Admitted with hypovolemic shock with acute kidney injury, acute kidney injury with pre-renal azotomia, h/o VTE, and HFrEF.  Recent h/o R UE edematous and bruised (hematoma).  Pt with recent admit to [facility] for hemorrhagic shock d/t acute blood loss anemia in setting of supratherapeutic Lovenox after recent vascular stent procedure (pt has been at [facility] since 9/10).  PMH includes active morphine pump L abdomen, asthma, CHF, COPD, pulmonary htn, chronic back pain, h/o back surgery, PAD, recurent c-diff infections.    PT Comments    Pt seen in afternoon to focus on transfer technique back to bed (slight incline lateral scoot to L with armrest lowered).  Focused on technique including B LE positioning, pushing through LE's, and increasing forward lean to off-weight to facilitate transfer.  Will continue to focus on strengthening and progressive functional mobility during hospitalization.    Follow Up Recommendations  CIR     Equipment Recommendations  Rolling walker with 5" wheels;3in1 (PT);Wheelchair (measurements PT);Wheelchair cushion (measurements PT);Hospital bed;Other (comment)(hoyer lift)    Recommendations for Other Services OT consult     Precautions / Restrictions Precautions Precautions: Fall Precaution Comments: R UE sling for OOB mobility (sling in pt's room); monitor BP (MAP >55 and as long as mentation WDL) Restrictions RUE Weight Bearing: Weight bearing as tolerated Other Position/Activity Restrictions: Discussed bedrest with bathroom privileges order with MD Elisabeth Pigeon 07/24/19 who cleared pt for OOB mobility (RN cleared pt for OOB mobility 07/31/19)    Mobility  Bed Mobility Overal bed mobility: Needs Assistance Bed Mobility: Sit to Supine       Sit to supine: Mod assist;+2 for physical assistance;HOB elevated   General bed mobility comments: assist for B LE's and trunk; assist to boost up in bed  Transfers Overall transfer level: Needs assistance Equipment used: None Transfers: Lateral/Scoot Transfers          Lateral/Scoot Transfers: Mod assist;Max assist;+2 physical assistance General transfer comment: 3 trials to scoot towards L (slightly uphill) recliner to bed with L UE on bedrail to assist; B knees blocked; vc's for technique  Ambulation/Gait             General Gait Details: not appropriate at this time   Stairs             Wheelchair Mobility    Modified Rankin (Stroke Patients Only)       Balance Overall balance assessment: Needs assistance Sitting-balance support: No upper extremity supported;Feet supported Sitting balance-Leahy Scale: Fair Sitting balance - Comments: pt tending to lean towards R side (R UE in sling for comfort); L UE support on bed                                    Cognition Arousal/Alertness: Awake/alert Behavior During Therapy: WFL for tasks assessed/performed Overall Cognitive Status: Within Functional Limits for tasks assessed                                 General Comments: very motivated to participate; tearful once d/t current status      Exercises      General Comments  Pt reporting being ready to go  back to bed.  Husband present.      Pertinent Vitals/Pain Pain Assessment: 0-10 Pain Score: 7  Pain Location: R UE and L thigh Pain Descriptors / Indicators: Sore;Tender;Burning;Throbbing Pain Intervention(s): Limited activity within patient's tolerance;Monitored during session;Premedicated before session;Repositioned  Vitals (HR and O2 on 2 L via nasal cannula) stable and WFL throughout treatment session.    Home Living                      Prior Function            PT Goals (current goals can now be  found in the care plan section) Acute Rehab PT Goals Patient Stated Goal: to do as much as she can to get better, be able to walk again PT Goal Formulation: With patient Time For Goal Achievement: 08/07/19 Potential to Achieve Goals: Fair Progress towards PT goals: Progressing toward goals    Frequency    7X/week      PT Plan Current plan remains appropriate    Co-evaluation              AM-PAC PT "6 Clicks" Mobility   Outcome Measure  Help needed turning from your back to your side while in a flat bed without using bedrails?: A Lot Help needed moving from lying on your back to sitting on the side of a flat bed without using bedrails?: A Lot Help needed moving to and from a bed to a chair (including a wheelchair)?: Total Help needed standing up from a chair using your arms (e.g., wheelchair or bedside chair)?: Total Help needed to walk in hospital room?: Total Help needed climbing 3-5 steps with a railing? : Total 6 Click Score: 8    End of Session Equipment Utilized During Treatment: Gait belt;Oxygen(2 L O2 via nasal cannula) Activity Tolerance: Patient tolerated treatment well Patient left: in bed;with call bell/phone within reach;with bed alarm set;with family/visitor present;Other (comment)(B heels floating via pillows) Nurse Communication: Mobility status;Precautions;Other (comment)(pt's purewick needing to be replaced) PT Visit Diagnosis: Other abnormalities of gait and mobility (R26.89);Muscle weakness (generalized) (M62.81);Difficulty in walking, not elsewhere classified (R26.2);Pain Pain - Right/Left: Right(L thigh) Pain - part of body: Arm     Time: 1550-1613 PT Time Calculation (min) (ACUTE ONLY): 23 min  Charges:  $Therapeutic Activity: 23-37 mins                     Leitha Bleak, PT 07/31/19, 4:24 PM [phone]

## 2019-07-31 NOTE — Progress Notes (Signed)
Occupational Therapy Treatment Patient Details Name: Regina White MRN: [id number] DOB: Sep 13, 1956 Today's Date: 07/31/2019    History of present illness Pt is a 63 y.o. female presenting to hospital 07/22/19 with hypotension.  Admitted with hypovolemic shock with acute kidney injury, acute kidney injury with pre-renal azotomia, h/o VTE, and HFrEF.  Recent h/o R UE edematous and bruised (hematoma).  Pt with recent admit to [facility] for hemorrhagic shock d/t acute blood loss anemia in setting of supratherapeutic Lovenox after recent vascular stent procedure (pt has been at [facility] since 9/10).  PMH includes active morphine pump L abdomen, asthma, CHF, COPD, pulmonary htn, chronic back pain, h/o back surgery, PAD, recurent c-diff infections.   OT comments  Pt seen for OT co-tx with PT this date. Pt continues to be very motivated to participate and determined to do whatever she can to improve. RUE sling applied and adjusted to support pain mgt. Pt able to perform bed mobility with PT with less physical assist required and able to perform lateral scoot transfers with +2 assist requiring less attempts to get from EOB to the recliner with the arm rest down. HR up into low 120's with bed mobility taking 5+ min to come back down a little (111-115) prior to functional transfer attempts. Pt continues to progress towards goals. Continues to benefit from skilled OT services. Continue to recommend CIR.    Follow Up Recommendations  CIR    Equipment Recommendations  3 in 1 bedside commode;Wheelchair (measurements OT);Wheelchair cushion (measurements OT);Hospital bed;Other (comment)    Recommendations for Other Services      Precautions / Restrictions Precautions Precautions: Fall Precaution Comments: R UE sling for OOB mobility (sling in pt's room); monitor BP (MAP >55 and as long as mentation WDL) Restrictions Weight Bearing Restrictions: No RUE Weight Bearing: Weight bearing as tolerated Other  Position/Activity Restrictions: Discussed bedrest with bathroom privileges order with MD Anselm Jungling 07/24/19 who cleared pt for OOB mobility (RN cleared pt for OOB mobility 07/31/19)       Mobility Bed Mobility Overal bed mobility: Needs Assistance Bed Mobility: Supine to Sit     Supine to sit: Mod assist     General bed mobility comments: with PT: assist for L>R LE; assist for trunk; use of bed rail with L UE; assist to scoot towards edge of bed  Transfers Overall transfer level: Needs assistance Equipment used: None Transfers: Lateral/Scoot Transfers          Lateral/Scoot Transfers: Mod assist;Max assist;+2 physical assistance General transfer comment: 3 trials to scoot towards R side bed to recliner with L armrest of recliner lowered; vc's for technique and assist for B LE placement; B knees blocked    Balance Overall balance assessment: Needs assistance Sitting-balance support: No upper extremity supported;Feet supported Sitting balance-Leahy Scale: Fair Sitting balance - Comments: pt tending to lean towards R side (R UE in sling for comfort); L UE support on bed                                   ADL either performed or assessed with clinical judgement   ADL                                         General ADL Comments: Mod-Max A for bathing, dressing, toileting at seated EOB  level     Vision Baseline Vision/History: Wears glasses Wears Glasses: At all times Patient Visual Report: No change from baseline     Perception     Praxis      Cognition Arousal/Alertness: Awake/alert Behavior During Therapy: WFL for tasks assessed/performed Overall Cognitive Status: Within Functional Limits for tasks assessed                                 General Comments: very motivated to participate; tearful occasionally d/t current status        Exercises Other Exercises Other Exercises: functional transfer training   Shoulder  Instructions       General Comments      Pertinent Vitals/ Pain       Pain Assessment: 0-10 Pain Score: 7  Pain Location: R UE and L thigh Pain Descriptors / Indicators: Sore;Tender;Burning;Throbbing Pain Intervention(s): Limited activity within patient's tolerance;Monitored during session;Premedicated before session;Repositioned  Home Living                                          Prior Functioning/Environment              Frequency  Min 3X/week        Progress Toward Goals  OT Goals(current goals can now be found in the care plan section)  Progress towards OT goals: Progressing toward goals  Acute Rehab OT Goals Patient Stated Goal: to do as much as she can to get better, be able to walk again OT Goal Formulation: With patient/family Time For Goal Achievement: 08/08/19 Potential to Achieve Goals: Good  Plan Discharge plan remains appropriate;Frequency remains appropriate    Co-evaluation    PT/OT/SLP Co-Evaluation/Treatment: Yes Reason for Co-Treatment: Complexity of the patient's impairments (multi-system involvement);For patient/therapist safety;To address functional/ADL transfers PT goals addressed during session: Mobility/safety with mobility;Strengthening/ROM OT goals addressed during session: ADL's and self-care;Strengthening/ROM      AM-PAC OT "6 Clicks" Daily Activity     Outcome Measure   Help from another person eating meals?: None Help from another person taking care of personal grooming?: None Help from another person toileting, which includes using toliet, bedpan, or urinal?: A Lot Help from another person bathing (including washing, rinsing, drying)?: A Lot Help from another person to put on and taking off regular upper body clothing?: A Lot Help from another person to put on and taking off regular lower body clothing?: A Lot 6 Click Score: 16    End of Session Equipment Utilized During Treatment: Gait belt  OT Visit  Diagnosis: Other abnormalities of gait and mobility (R26.89);Muscle weakness (generalized) (M62.81);Pain Pain - Right/Left: Right Pain - part of body: Arm   Activity Tolerance Patient tolerated treatment well   Patient Left in chair;with call bell/phone within reach;with chair alarm set;with family/visitor present   Nurse Communication          Time: 2353-6144 OT Time Calculation (min): 31 min  Charges: OT General Charges $OT Visit: 1 Visit OT Treatments $Therapeutic Activity: 8-22 mins  Richrd Prime, MPH, MS, OTR/L ascom [phone] 07/31/19, 11:41 AM

## 2019-07-31 NOTE — Progress Notes (Signed)
Physical Therapy Treatment Patient Details Name: Regina White MRN: [id number] DOB: 1956-08-28 Today's Date: 07/31/2019    History of Present Illness Pt is a 63 y.o. female presenting to hospital 07/22/19 with hypotension.  Admitted with hypovolemic shock with acute kidney injury, acute kidney injury with pre-renal azotomia, h/o VTE, and HFrEF.  Recent h/o R UE edematous and bruised (hematoma).  Pt with recent admit to [facility] for hemorrhagic shock d/t acute blood loss anemia in setting of supratherapeutic Lovenox after recent vascular stent procedure (pt has been at [facility] since 9/10).  PMH includes active morphine pump L abdomen, asthma, CHF, COPD, pulmonary htn, chronic back pain, h/o back surgery, PAD, recurent c-diff infections.    PT Comments    Pt reporting L LE feeling more stiff this morning with 7/10 R UE and L thigh pain but pt continues to be very motivated to participate in therapy.  Tolerated LE ex's in bed fairly well (assist for L LE d/t pain and weakness).  PT/OT co-treat performed.  Mod assist semi-supine to sitting onto edge of bed.  Mod to max assist x2 lateral scoot to R bed to recliner with L armrest lowered and 3 trials to get to chair; vc's for technique; increased challenge d/t pt's R UE too painful to use (R UE in sling during session for comfort).  Pt appearing a little more SOB with activity today (O2 sats 92% or greater on 2 L O2 via nasal cannula during session).  BP/MAP vitals during session: supine in bed BP 113/58 with MAP 75; sitting edge of bed BP 98/72 with MAP 82; and sitting in chair end of session BP 99/53 with MAP 66.  HR 84 bpm at rest beginning of session but increased to 121 bpm with activity (nurse notified); HR gradually decreasing with sitting rest in chair end of session.  Will continue to focus on strengthening and progressive functional mobility during hospital stay.   Follow Up Recommendations  CIR     Equipment Recommendations  Rolling  walker with 5" wheels;3in1 (PT);Wheelchair (measurements PT);Wheelchair cushion (measurements PT);Hospital bed;Other (comment)(hoyer lift)    Recommendations for Other Services OT consult     Precautions / Restrictions Precautions Precautions: Fall Precaution Comments: R UE sling for OOB mobility (sling in pt's room); monitor BP (MAP >55 and as long as mentation WDL) Restrictions Weight Bearing Restrictions: No RUE Weight Bearing: Weight bearing as tolerated Other Position/Activity Restrictions: Discussed bedrest with bathroom privileges order with MD Elisabeth Pigeon 07/24/19 who cleared pt for OOB mobility (RN cleared pt for OOB mobility 07/31/19)    Mobility  Bed Mobility Overal bed mobility: Needs Assistance Bed Mobility: Supine to Sit     Supine to sit: Mod assist     General bed mobility comments: assist for L>R LE; assist for trunk; use of bed rail with L UE; assist to scoot towards edge of bed  Transfers Overall transfer level: Needs assistance Equipment used: None Transfers: Lateral/Scoot Transfers          Lateral/Scoot Transfers: Mod assist;Max assist;+2 physical assistance General transfer comment: 3 trials to scoot towards R side bed to recliner with L armrest of recliner lowered; vc's for technique and assist for B LE placement; B knees blocked  Ambulation/Gait             General Gait Details: not appropriate at this time   Stairs             Wheelchair Mobility    Modified Rankin (Stroke Patients  Only)       Balance Overall balance assessment: Needs assistance Sitting-balance support: No upper extremity supported;Feet supported Sitting balance-Leahy Scale: Fair Sitting balance - Comments: pt tending to lean towards R side (R UE in sling for comfort); L UE support on bed                                    Cognition Arousal/Alertness: Awake/alert Behavior During Therapy: WFL for tasks assessed/performed Overall Cognitive  Status: Within Functional Limits for tasks assessed                                 General Comments: very motivated to participate; tearful occasionally d/t current status      Exercises Total Joint Exercises Short Arc Quad: AROM;Right;AAROM;Left;Strengthening;10 reps;Supine Heel Slides: AROM;Right;AAROM;Left;Strengthening;Supine(x10 reps R LE; 10 reps x2 L LE) Hip ABduction/ADduction: AROM;Right;AAROM;Left;Strengthening;10 reps;Supine Straight Leg Raises: AAROM;Strengthening;Both;10 reps;Supine    General Comments   Nursing cleared pt for participation in physical therapy.   Pt's husband present during session.  Pt agreeable to PT session.      Pertinent Vitals/Pain Pain Assessment: 0-10 Pain Score: 7  Pain Location: R UE and L thigh Pain Descriptors / Indicators: Sore;Tender;Burning;Throbbing Pain Intervention(s): Limited activity within patient's tolerance;Monitored during session;Premedicated before session;Repositioned    Home Living                      Prior Function            PT Goals (current goals can now be found in the care plan section) Acute Rehab PT Goals Patient Stated Goal: to do as much as she can to get better, be able to walk again PT Goal Formulation: With patient Time For Goal Achievement: 08/07/19 Potential to Achieve Goals: Fair Progress towards PT goals: Progressing toward goals    Frequency    7X/week      PT Plan Current plan remains appropriate    Co-evaluation PT/OT/SLP Co-Evaluation/Treatment: Yes Reason for Co-Treatment: Complexity of the patient's impairments (multi-system involvement);For patient/therapist safety;To address functional/ADL transfers PT goals addressed during session: Mobility/safety with mobility;Balance;Strengthening/ROM OT goals addressed during session: ADL's and self-care;Strengthening/ROM      AM-PAC PT "6 Clicks" Mobility   Outcome Measure  Help needed turning from your back to  your side while in a flat bed without using bedrails?: A Lot Help needed moving from lying on your back to sitting on the side of a flat bed without using bedrails?: A Lot Help needed moving to and from a bed to a chair (including a wheelchair)?: Total Help needed standing up from a chair using your arms (e.g., wheelchair or bedside chair)?: Total Help needed to walk in hospital room?: Total Help needed climbing 3-5 steps with a railing? : Total 6 Click Score: 8    End of Session Equipment Utilized During Treatment: Gait belt;Oxygen(2 L O2 via nasal cannula) Activity Tolerance: Patient tolerated treatment well Patient left: in chair;with call bell/phone within reach;with chair alarm set;with nursing/sitter in room;with family/visitor present;Other (comment)(B heels floating via pillows) Nurse Communication: Mobility status;Precautions;Other (comment)(Purewick needing to be replaced) PT Visit Diagnosis: Other abnormalities of gait and mobility (R26.89);Muscle weakness (generalized) (M62.81);Difficulty in walking, not elsewhere classified (R26.2);Pain Pain - Right/Left: Right(L thigh) Pain - part of body: Arm     Time: 2482-5003 PT Time Calculation (min) (ACUTE  ONLY): 41 min  Charges:  $Therapeutic Exercise: 8-22 mins $Therapeutic Activity: 8-22 mins                    Leitha Bleak, PT 07/31/19, 11:25 AM [phone]

## 2019-07-31 NOTE — Progress Notes (Signed)
[place] at [place] NAME: Regina White    MR#:  [id number]  DATE OF BIRTH:  04/16/1956  SUBJECTIVE:  CHIEF COMPLAINT:   Chief Complaint  Patient presents with  . Hypotension   Pt had recent venous thrombosis and then hematoma- recent long stay at [facility] and sent to rehab last week, not much mobile there. Sent to hospital due to hypotension. Husband in room, Patient was sent to the floor from ICU on 07/27/2019 Reporting severe weakness ,AGREEABLE to  go to CIR but no SNF   REVIEW OF SYSTEMS:  CONSTITUTIONAL: fatigue or weakness. No fever, EYES: No blurred or double vision.  EARS, NOSE, AND THROAT: No tinnitus or ear pain.  RESPIRATORY: No cough, shortness of breath, wheezing or hemoptysis.  CARDIOVASCULAR: No chest pain, orthopnea, edema.  GASTROINTESTINAL: No nausea, vomiting, diarrhea or abdominal pain.  GENITOURINARY: No dysuria, hematuria.  ENDOCRINE: No polyuria, nocturia,  HEMATOLOGY: No anemia, easy bruising or bleeding SKIN: No rash or lesion. MUSCULOSKELETAL: No joint pain or arthritis.   NEUROLOGIC: No tingling, numbness, weakness.  PSYCHIATRY: No anxiety or depression.   ROS  DRUG ALLERGIES:   Allergies  Allergen Reactions  . Penicillins Anaphylaxis    BREATHING PROBLEMS  Patient tolerated Ceftriaxone September 2020  . Erythromycin Nausea And Vomiting and Rash    STOMACH PAIN  . Levofloxacin     Other reaction(s): Other MUSCLE ACHES  . Lidocaine Rash  . Clindamycin     Recurrent C diff  . Levothyroxine Other (See Comments)    Vaginal bleeding Patient used BRAND ONLY SYNTHROID  . Tiotropium Other (See Comments)    Coughing up blood  . Doxycycline Nausea And Vomiting  . Sulfa Antibiotics Rash    VITALS:  Blood pressure (!) 97/58, pulse 82, temperature 97.7 F (36.5 C), temperature source Oral, resp. rate 20, height 5\' 5"  (1.651 m), weight 96.8 kg, SpO2 94 %.  PHYSICAL EXAMINATION:  GENERAL:  63  y.o.-year-old patient lying in the bed with no acute distress.  EYES: Pupils equal, round, reactive to light and accommodation. No scleral icterus. Extraocular muscles intact.  HEENT: Head atraumatic, normocephalic. Oropharynx and nasopharynx clear.  NECK:  Supple, no jugular venous distention. No thyroid enlargement, no tenderness.  LUNGS: Normal breath sounds bilaterally, no wheezing, rales,rhonchi or crepitation. No use of accessory muscles of respiration.  CARDIOVASCULAR: S1, S2 normal. No murmurs, rubs, or gallops.  ABDOMEN: Soft, nontender, nondistended. Bowel sounds present.  EXTREMITIES: No pedal edema, cyanosis, or clubbing.  NEUROLOGIC: Cranial nerves II through XII are intact. Muscle strength 3-4/5 in all extremities. Sensation intact. Gait not checked.  PSYCHIATRIC: The patient is alert and oriented x 3.  SKIN: No obvious rash, lesion, or ulcer.   Physical Exam LABORATORY PANEL:   CBC Recent Labs  Lab 07/28/19 0440  WBC 6.4  HGB 9.6*  HCT 33.5*  PLT 278   ------------------------------------------------------------------------------------------------------------------  Chemistries  Recent Labs  Lab 07/29/19 1527  NA 139  K 3.9  CL 107  CO2 24  GLUCOSE 121*  BUN 30*  CREATININE 0.83  CALCIUM 8.8*  MG 1.8   ------------------------------------------------------------------------------------------------------------------  Cardiac Enzymes No results for input(s): TROPONINI in the last 168 hours. ------------------------------------------------------------------------------------------------------------------  RADIOLOGY:  No results found.  ASSESSMENT AND PLAN:   Active Problems:   Hypovolemic shock (HCC)  1.  Hypovolemic/septic shock with acute kidney injury: Patient has purposely not taking in adequate amounts of fluid which has led to acute kidney injury and profound  hypotension.  She has also been  receiving her hypertensive medications at the facility  while having decreased oral intake which has decreased her BP significantly. Hemoglobin is stable.  There is no evidence of hemorrhagic shock.   COVID negative UTI- ur cx with ecoli-finished Abx. - off pressors  -Clinically  Stable and shock resolved  2.  Acute kidney injury with prerenal azotemia: Resolved with hydration  3.  History of VTE: Hematology recommends apixaban, continue Eliquis 2.5 mg p.o. twice daily  4.  Chronic diastolic heart failure: Well compensated at this time, once blood pressure stabilizes can consider resuming beta-blocker, Entresto  Resume Bumex patient is reporting tightness in her extremities  5.  Chronic hypoxic respiratory failure with underlying history of COPD/obstructive sleep apnea: Stable at this time  6.  Hypotension with a history of hypertension: Holding blood pressure medicine  7.  Chronic pain managed with morphine pump  8.  Hypothyroidism: Continue Synthroid  9.  Peripheral arterial disease: Monitor on Eliquis - recent stenting in the left leg  10.  Weakness: Multifactorial -PT and OT recommends CIR .  Patient is agreeable,f/u with SW. No CIR approval yet, pt is refusing to go to SNF  11.  Nonsustained V. tach possible remote telemetry CMHG reviewed  Tele strip did not find NSVtach other than PVCs, no new  recommendations as patient is back to normal sinus rhythm   All the records are reviewed and case discussed with Care Management/Social Workerr. Management plans discussed with the patient, husband at bedside and they are in agreement.  CODE STATUS: Full.  TOTAL TIME TAKING CARE OF THIS PATIENT: 25 minutes.    POSSIBLE D/C IN 1-2 DAYS, DEPENDING ON CLINICAL CONDITION.   Ramonita Lab M.D on 07/31/2019   Between 7am to 6pm - Pager - [phone]   After 6pm go to [email or web address] - password [email or web address]  [facility] [place] [facility]  Office  [phone]  CC: Primary care physician; Cain Sieve, MD  Note: This  dictation was prepared with Dragon dictation along with smaller phrase technology. Any transcriptional errors that result from this process are unintentional.

## 2019-07-31 NOTE — Progress Notes (Signed)
Nutrition Brief Note  Patient identified for LOS   63 y.o. female presenting to hospital 07/22/19 with hypotension.  Admitted with hypovolemic shock with acute kidney injury, acute kidney injury with pre-renal azotomia, h/o VTE, and HFrEF.  Recent h/o R UE edematous and bruised (hematoma).  Pt with recent admit to [facility] for hemorrhagic shock d/t acute blood loss anemia in setting of supratherapeutic Lovenox after recent vascular stent procedure (pt has been at [facility] since 9/10).  PMH includes active morphine pump L abdomen, asthma, CHF, COPD, pulmonary htn, chronic back pain, h/o back surgery, PAD, recurent c-diff infections.  Wt Readings from Last 15 Encounters:  07/31/19 96.8 kg  12/04/18 102.1 kg  06/02/18 112.9 kg  05/01/18 105.2 kg    Body mass index is 35.51 kg/m. Patient meets criteria for obesity based on current BMI. Per chart, pt with 12lb(6%) weight loss over the past few months; this is not significant.   Current diet order is regular, patient is consuming approximately 100% of meals at this time. Labs and medications reviewed.   No nutrition interventions warranted at this time. If nutrition issues arise, please consult RD.   Koleen Distance MS, RD, LDN Pager #- [phone] Office#- [phone] After Hours Pager: [phone]

## 2019-08-01 MED ORDER — SODIUM CHLORIDE 0.9% FLUSH
3.0000 mL | Freq: Two times a day (BID) | INTRAVENOUS | Status: DC
  Administered 2019-08-01 – 2019-08-02 (×2): 3 mL via INTRAVENOUS

## 2019-08-01 NOTE — Progress Notes (Signed)
PT Cancellation Note  Patient Details Name: Regina White MRN: [id number] DOB: [birth year]   Cancelled Treatment:    Reason Eval/Treat Not Completed: Other (comment).  Upon PT arrival, pt reports calling her insurance company today to verify her benefits and reports she does have home health service benefits and wanted to discharge home with home health services.  CM then arrived to verify information for potential rehab facility.  D/t pt, pt's husband, and CM all present and needing to discuss above information to determine/facilitate discharge plan, therapist deferred session attempt.  Will re-attempt PT treatment session at a later date/time.  Leitha Bleak, PT 08/01/19, 3:08 PM [phone]

## 2019-08-01 NOTE — TOC Initial Note (Deleted)
Transition of Care [facility]) - Initial/Assessment Note    Patient Details  Name: Regina White MRN: [id number] Date of Birth: 06/19/1956  Transition [facility] Care [facility]) CM/SW Contact:    Elza Rafter, RN Phone Number: 08/01/2019, 9:35 AM  Clinical Narrative:    Patient admitted with UTI, weakness, frequent recent falls.  Lives with daughter Olin Hauser.  Spoke with [phone].  She states he lives with her; current with Dr. Edwina Barth; obtains medications at [facility] in [place].  Has a walke rand a wheelchair at home.  He has had frequent falls recently and increased weakness.  She states his cognitive baseline is forgetfulness.  "He will sometimes forget what he is going to say".  Olin Hauser and patient are agreeable to SNF.  Work up complete and awaiting bed offers.  Olin Hauser does not want [facility].  He has been to [facility] in the past.  Will  Continue to assist with DC planning.            Expected Discharge Plan: Skilled Nursing Facility Barriers to Discharge: Continued Medical Work up   Patient Goals and CMS Choice Patient states their goals for this hospitalization and ongoing recovery are:: spoke with Pamela-daughter-agreeable to SNF CMS Medicare.gov Compare Post Acute Care list provided to:: Patient Represenative (must comment)(Pamela-daughter) Choice offered to / list presented to : Adult Children  Expected Discharge Plan and Services Expected Discharge Plan: [place]   Discharge Planning Services: CM Consult Post Acute Care Choice: [place] Living arrangements for the past 2 months: Single Family Home                                      Prior Living Arrangements/Services Living arrangements for the past 2 months: Single Family Home Lives with:: Adult Children Patient language and need for interpreter reviewed:: Yes Do you feel safe going back to the place where you live?: Yes      Need for Family  Participation in Patient Care: Yes (Comment) Care giver support system in place?: Yes (comment)(daughter) Current home services: DME Criminal Activity/Legal Involvement Pertinent to Current Situation/Hospitalization: Yes - Comment as needed  Activities of Daily Living Home Assistive Devices/Equipment: None ADL Screening (condition at time of admission) Patient's cognitive ability adequate to safely complete daily activities?: Yes Is the patient deaf or have difficulty hearing?: No Does the patient have difficulty seeing, even when wearing glasses/contacts?: No Does the patient have difficulty concentrating, remembering, or making decisions?: No Patient able to express need for assistance with ADLs?: Yes Does the patient have difficulty dressing or bathing?: Yes Independently performs ADLs?: No Communication: Independent Dressing (OT): Needs assistance Is this a change from baseline?: Pre-admission baseline Grooming: Needs assistance Is this a change from baseline?: Pre-admission baseline Feeding: Needs assistance Is this a change from baseline?: Pre-admission baseline Bathing: Needs assistance Is this a change from baseline?: Pre-admission baseline Toileting: Needs assistance Is this a change from baseline?: Pre-admission baseline In/Out Bed: Needs assistance Is this a change from baseline?: Pre-admission baseline Walks in Home: Needs assistance Is this a change from baseline?: Pre-admission baseline Does the patient have difficulty walking or climbing stairs?: Yes Weakness of Legs: Both Weakness of Arms/Hands: Right  Permission Sought/Granted Permission sought to share information with : Case Manager, Customer service manager, Family Supports Permission granted to share information with : Yes, Verbal Permission Granted  Emotional Assessment Appearance:: Appears stated age Attitude/Demeanor/Rapport: Gracious Affect (typically observed):  Accepting Orientation: : Oriented to Self, Oriented to Place, Oriented to  Time, Oriented to Situation Alcohol / Substance Use: Not Applicable Psych Involvement: No (comment)  Admission diagnosis:  Hypotension, unspecified hypotension type [I95.9] Sepsis, due to unspecified organism, unspecified whether acute organ dysfunction present [facility]) [A41.9] Patient Active Problem List   Diagnosis Date Noted  . Hypovolemic shock (HCC) 07/22/2019  . Acute respiratory failure with hypoxemia (HCC) 12/04/2018  . Acute respiratory failure with hypoxia (HCC) 06/02/2018  . Diarrhea of presumed infectious origin 02/17/2016  . Right acute serous otitis media 02/17/2016  . COPD (chronic obstructive pulmonary disease) (HCC) 12/23/2015  . Heart failure with preserved ejection fraction (HCC) 12/23/2015  . DDD (degenerative disc disease) 09/25/2013  . Narrowing of intervertebral disc space 09/25/2013  . OSA (obstructive sleep apnea) 07/25/2013  . Deep vein thrombosis (DVT) (HCC) 06/25/2013  . Erythema 06/18/2013  . Left leg pain 06/18/2013  . Mediastinal lymphadenopathy 06/18/2013  . DVT, lower extremity (HCC) 06/14/2013  . Pulmonary hypertension (HCC) 06/14/2013  . Asthma 09/21/2010  . Chronic pain 09/21/2010  . Hypertension, benign 09/21/2010  . Hypothyroidism 09/21/2010  . Venous stasis 09/21/2010   PCP:  Cain Sieve, MD Pharmacy:   [address] [place], [place] - [address] [place] [address] Phone: [phone] Fax: [phone]  [facility] [address] - [place], [place] - [address] [place] [address] Phone: [phone] Fax: [phone]     Social Determinants of Health (SDOH) Interventions    Readmission Risk Interventions No flowsheet data found.

## 2019-08-01 NOTE — Progress Notes (Signed)
SATURATION QUALIFICATIONS: (This note is used to comply with regulatory documentation for home oxygen)  Patient Saturations on Room Air at Rest = 80%  Patient Saturations on Room Air while Ambulating = na%  Patient Saturations on 2.5 Liters of oxygen while at Rest = 93%  Please briefly explain why patient needs home oxygen:

## 2019-08-01 NOTE — TOC Progression Note (Signed)
Transition of Care [facility]) - Progression Note    Patient Details  Name: Regina White MRN: [id number] Date of Birth: 03-15-1956  Transition of Care [facility]) CM/SW Contact  Elza Rafter, RN Phone Number: 08/01/2019, 11:51 AM  Clinical Narrative:   Patient does not qualify for CIR.  Patient is very upset and is afraid to go back to SNF.  [facility] in [place] can accept patient and has started insurance authorization.  Representative with [employer] says she may get auth tomorrow.  Will continue to follow and assist with discharge.      Expected Discharge Plan: [place] Barriers to Discharge: Continued Medical Work up  Expected Discharge Plan and Services Expected Discharge Plan: [place]   Discharge Planning Services: CM Consult   Living arrangements for the past 2 months: Single Family Home                                       Social Determinants of Health (SDOH) Interventions    Readmission Risk Interventions No flowsheet data found.

## 2019-08-01 NOTE — Progress Notes (Signed)
[place] at [place] NAME: Regina White    MR#:  [id number]  DATE OF BIRTH:  [birth year]  SUBJECTIVE:  CHIEF COMPLAINT:   Chief Complaint  Patient presents with  . Hypotension   Pt had recent venous thrombosis and then hematoma- recent long stay at [facility] and sent to rehab last week, not much mobile there. Sent to hospital due to hypotension. Husband in room, Patient was sent to the floor from ICU on 07/27/2019 Reporting severe weakness , uncomfortable to go to SNF  REVIEW OF SYSTEMS:  CONSTITUTIONAL: fatigue or weakness. No fever, EYES: No blurred or double vision.  EARS, NOSE, AND THROAT: No tinnitus or ear pain.  RESPIRATORY: No cough, shortness of breath, wheezing or hemoptysis.  CARDIOVASCULAR: No chest pain, orthopnea, edema.  GASTROINTESTINAL: No nausea, vomiting, diarrhea or abdominal pain.  GENITOURINARY: No dysuria, hematuria.  ENDOCRINE: No polyuria, nocturia,  HEMATOLOGY: No anemia, easy bruising or bleeding SKIN: No rash or lesion. MUSCULOSKELETAL: No joint pain or arthritis.   NEUROLOGIC: No tingling, numbness, weakness.  PSYCHIATRY: No anxiety or depression.   ROS  DRUG ALLERGIES:   Allergies  Allergen Reactions  . Penicillins Anaphylaxis    BREATHING PROBLEMS  Patient tolerated Ceftriaxone September 2020  . Erythromycin Nausea And Vomiting and Rash    STOMACH PAIN  . Levofloxacin     Other reaction(s): Other MUSCLE ACHES  . Lidocaine Rash  . Clindamycin     Recurrent C diff  . Levothyroxine Other (See Comments)    Vaginal bleeding Patient used BRAND ONLY SYNTHROID  . Tiotropium Other (See Comments)    Coughing up blood  . Doxycycline Nausea And Vomiting  . Sulfa Antibiotics Rash    VITALS:  Blood pressure (!) 104/43, pulse 75, temperature 97.9 F (36.6 C), temperature source Oral, resp. rate 20, height 5\' 5"  (1.651 m), weight 97 kg, SpO2 93 %.  PHYSICAL EXAMINATION:  GENERAL:  63 y.o.-year-old  patient lying in the bed with no acute distress.  EYES: Pupils equal, round, reactive to light and accommodation. No scleral icterus. Extraocular muscles intact.  HEENT: Head atraumatic, normocephalic. Oropharynx and nasopharynx clear.  NECK:  Supple, no jugular venous distention. No thyroid enlargement, no tenderness.  LUNGS: Normal breath sounds bilaterally, no wheezing, rales,rhonchi or crepitation. No use of accessory muscles of respiration.  CARDIOVASCULAR: S1, S2 normal. No murmurs, rubs, or gallops.  ABDOMEN: Soft, nontender, nondistended. Bowel sounds present.  EXTREMITIES: No pedal edema, cyanosis, or clubbing.  NEUROLOGIC: Cranial nerves II through XII are intact. Muscle strength 3-4/5 in all extremities. Sensation intact. Gait not checked.  PSYCHIATRIC: The patient is alert and oriented x 3.  SKIN: No obvious rash, lesion, or ulcer.   Physical Exam LABORATORY PANEL:   CBC Recent Labs  Lab 07/28/19 0440  WBC 6.4  HGB 9.6*  HCT 33.5*  PLT 278   ------------------------------------------------------------------------------------------------------------------  Chemistries  Recent Labs  Lab 07/29/19 1527  NA 139  K 3.9  CL 107  CO2 24  GLUCOSE 121*  BUN 30*  CREATININE 0.83  CALCIUM 8.8*  MG 1.8   ------------------------------------------------------------------------------------------------------------------  Cardiac Enzymes No results for input(s): TROPONINI in the last 168 hours. ------------------------------------------------------------------------------------------------------------------  RADIOLOGY:  No results found.  ASSESSMENT AND PLAN:   Active Problems:   Hypovolemic shock (HCC)  1.  Hypovolemic/septic shock with acute kidney injury: Patient has purposely not taking in adequate amounts of fluid which has led to acute kidney injury and profound hypotension.  She has  also been  receiving her hypertensive medications at the facility while having  decreased oral intake which has decreased her BP significantly. Hemoglobin is stable.  There is no evidence of hemorrhagic shock.   COVID negative UTI- ur cx with ecoli-finished Abx. - off pressors  -Clinically  Stable and shock resolved -Awaiting placement -Check a.m. labs  2.  Acute kidney injury with prerenal azotemia: Resolved with hydration  3.  History of VTE: Hematology recommends apixaban, continue Eliquis 2.5 mg p.o. twice daily  4.  Chronic diastolic heart failure: Well compensated at this time, once blood pressure stabilizes can consider resuming beta-blocker, Entresto  Resume Bumex patient is reporting tightness in her extremities  5.  Chronic hypoxic respiratory failure with underlying history of COPD/obstructive sleep apnea: Stable at this time  6.  Hypotension with a history of hypertension: Holding blood pressure medicine  7.  Chronic pain managed with morphine pump  8.  Hypothyroidism: Continue Synthroid  9.  Peripheral arterial disease: Monitor on Eliquis - recent stenting in the left leg  10.  Weakness: Multifactorial -PT and OT recommends CIR .  Patient is agreeable,f/u with SW. No CIR approval yet, pt is refusing to go to SNF  11.  Nonsustained V. tach possible remote telemetry CMHG reviewed  Tele strip did not find NSVtach other than PVCs, no new  recommendations as patient is back to normal sinus rhythm   All the records are reviewed and case discussed with Care Management/Social Workerr. Management plans discussed with the patient, husband at bedside and they are in agreement.  CODE STATUS: Full.  TOTAL TIME TAKING CARE OF THIS PATIENT: 25 minutes.    POSSIBLE D/C IN 1-2 DAYS, DEPENDING ON CLINICAL CONDITION.   Ramonita Lab M.D on 08/01/2019   Between 7am to 6pm - Pager - [phone]   After 6pm go to [email or web address] - password [email or web address]  [facility] [place] [facility]  Office  [phone]  CC: Primary care physician; Cain Sieve, MD  Note: This dictation was prepared with Dragon dictation along with smaller phrase technology. Any transcriptional errors that result from this process are unintentional.

## 2019-08-01 NOTE — TOC Progression Note (Addendum)
Transition of Care [facility]) - Progression Note    Patient Details  Name: Regina White MRN: [id number] Date of Birth: December 07, 1955  Transition of Care [facility]) CM/SW Contact  Elza Rafter, RN Phone Number: 08/01/2019, 3:06 PM  Clinical Narrative:  Patient called [employer] and they told her she can get [facility] services and she has met her deductible.  Medicare part A is primary; no part B.  [employer] is secondary.  [facility] can take patient with her part A.  Now patient is stating she does not want to go to SNF.  Because [employer] has told her she can get coverage I am reaching out to [place] with [place].   Jason to bedside with RNCM;  They can accept patient for home health services RN, PT, OT, aide and SW.  Will update MD.  [facility] will not be able to visit patient until Friday.    Brad with [employer] delivered a BSC  Asked RN to wean O2 if possible to prepare for DC tomorrow.   Geoffry Paradise, BSN, RN Care Manager [phone]      Expected Discharge Plan: Skilled Nursing Facility Barriers to Discharge: Continued Medical Work up  Expected Discharge Plan and Services Expected Discharge Plan: [place]   Discharge Planning Services: CM Consult   Living arrangements for the past 2 months: Single Family Home                                       Social Determinants of Health (SDOH) Interventions    Readmission Risk Interventions No flowsheet data found.

## 2019-08-01 NOTE — Progress Notes (Signed)
PT Cancellation Note  Patient Details Name: Regina White MRN: [id number] DOB: 02/04/1956   Cancelled Treatment:    Reason Eval/Treat Not Completed: Other (comment).  Upon arrival to pt's room, pt appearing very upset and tearful and reporting she was just told no inpatient rehab facility would take her and she did not want to go to a STR facility (nurse and CM notified).  Pt's husband had just arrived.  Deferred therapy treatment to allow time for pt and pt's husband to talk.  Will re-attempt PT treatment session at a later date/time.  Leitha Bleak, PT 08/01/19, 10:25 AM [phone]

## 2019-08-02 LAB — CBC
HCT: 34.9 % — ABNORMAL LOW (ref 36.0–46.0)
Hemoglobin: 10.2 g/dL — ABNORMAL LOW (ref 12.0–15.0)
MCH: 27.5 pg (ref 26.0–34.0)
MCHC: 29.2 g/dL — ABNORMAL LOW (ref 30.0–36.0)
MCV: 94.1 fL (ref 80.0–100.0)
Platelets: 253 10*3/uL (ref 150–400)
RBC: 3.71 MIL/uL — ABNORMAL LOW (ref 3.87–5.11)
RDW: 16.8 % — ABNORMAL HIGH (ref 11.5–15.5)
WBC: 7 10*3/uL (ref 4.0–10.5)
nRBC: 0 % (ref 0.0–0.2)

## 2019-08-02 LAB — CREATININE, SERUM
Creatinine, Ser: 0.8 mg/dL (ref 0.44–1.00)
GFR calc Af Amer: >60 mL/min (ref >60)
GFR calc non Af Amer: >60 mL/min (ref >60)

## 2019-08-02 MED ORDER — ADULT MULTIVITAMIN W/MINERALS CH: 1.0000 | ORAL_TABLET | Freq: Every day | ORAL | 0 refills | Status: DC

## 2019-08-02 MED ORDER — RISAQUAD PO CAPS: 2.0000 | ORAL_CAPSULE | Freq: Three times a day (TID) | ORAL | 0 refills | Status: DC

## 2019-08-02 MED ORDER — OXYCODONE HCL 5 MG PO TABS: 5.0000 mg | ORAL_TABLET | Freq: Four times a day (QID) | ORAL | 0 refills | Status: DC | PRN

## 2019-08-02 MED ORDER — MIDODRINE HCL 10 MG PO TABS: 10.0000 mg | ORAL_TABLET | Freq: Three times a day (TID) | ORAL | 0 refills | Status: DC

## 2019-08-02 MED ORDER — INFLUENZA VAC SPLIT QUAD 0.5 ML IM SUSY
0.5000 mL | PREFILLED_SYRINGE | Freq: Once | INTRAMUSCULAR | Status: AC
  Administered 2019-08-02: 0.5 mL via INTRAMUSCULAR
  Filled 2019-08-02: qty 0.5

## 2019-08-02 MED ORDER — INFLUENZA VAC SPLIT QUAD 0.5 ML IM SUSY
0.5000 mL | PREFILLED_SYRINGE | INTRAMUSCULAR | Status: DC
  Filled 2019-08-02 (×2): qty 0.5

## 2019-08-02 NOTE — Progress Notes (Signed)
Patient being discharged home via EMS. Provided discharge education and patient demonstrated understanding.

## 2019-08-02 NOTE — Discharge Instructions (Signed)
Follow-up with primary care physician in 3 days Daily weights, intake and output Outpatient follow-up with cardiology in a week Follow-up with pain management as scheduled or in 2 weeks  DIET:

## 2019-08-02 NOTE — TOC Transition Note (Addendum)
Transition of Care [facility]) - CM/SW Discharge Note   Patient Details  Name: Regina White MRN: [id number] Date of Birth: [birth year]  Transition of Care [facility]) CM/SW Contact:  Elza Rafter, RN Phone Number: 08/02/2019, 8:41 AM   Clinical Narrative:   Plan is to DC to home today with home health through [facility].  Corene Cornea is aware.  BSC has been delivered and O2 to be delivered today by [employer].  Patient will DC via [facility].  Packet placed on chart.    @ 1200 pm-Hoyer lift will be delivered to home per Leroy Sea with [employer]    Final next level of care: Home w Home Health Services Barriers to Discharge: No Barriers Identified   Patient Goals and CMS Choice Patient states their goals for this hospitalization and ongoing recovery are:: Would like CIR-does not want rehab CMS Medicare.gov Compare Post Acute Care list provided to:: Patient Choice offered to / list presented to : Patient, Spouse  Discharge Placement                       Discharge Plan and Services   Discharge Planning Services: CM Consult            DME Arranged: Oxygen DME Agency: [employer] Date DME Agency Contacted: 08/01/19 Time DME Agency Contacted: [phone] Representative spoke with at DME Agency: [place]: RN, PT, OT, Nurse's Aide, Social Work [employer] Agency: [place] ([place])   Time [place]: 1532 Representative spoke with at [place]: [place] ([place]) Interventions     Readmission Risk Interventions No flowsheet data found.

## 2019-08-02 NOTE — Plan of Care (Signed)
  Problem: Activity: Goal: Risk for activity intolerance will decrease Outcome: Not Progressing   Problem: Pain Managment: Goal: General experience of comfort will improve Outcome: Progressing   Problem: Safety: Goal: Ability to remain free from injury will improve Outcome: Progressing

## 2019-08-02 NOTE — Discharge Summary (Signed)
[place] [facility] [place] NAME: Regina White    MR#:  [id number]  DATE OF BIRTH:  [birth year]  DATE OF ADMISSION:  07/22/2019 ADMITTING PHYSICIAN: Bettey Costa, MD  DATE OF DISCHARGE:  08/02/19   PRIMARY CARE PHYSICIAN: Jacklynn Barnacle, MD    ADMISSION DIAGNOSIS:  Hypotension, unspecified hypotension type [I95.9] Sepsis, due to unspecified organism, unspecified whether acute organ dysfunction present ([place]) [A41.9]  DISCHARGE DIAGNOSIS:  Active Problems:   Hypovolemic shock ([place]) Extremely weak  SECONDARY DIAGNOSIS:   Past Medical History:  Diagnosis Date  . Asthma   . CHF (congestive heart failure) ([place])   . Chronic back pain   . COPD (chronic obstructive pulmonary disease) ([place])   . Pulmonary hypertension [facility])     HOSPITAL COURSE:  HPI Regina White  is a 63 y.o. female with a known history of recent hospitalization at [facility] for hemorrhagic shock due to acute blood loss anemia in the setting of supratherapeutic Lovenox after recent vascular stenting procedure, HFrEF, OSA, HTN , chronic pain with morphine pump, PAD and severe pulmonary hypertension who presents emergency room due to low blood pressure.  She is at [employer].  Staff went to obtain vitals and her blood pressure was 51/38.  She has been subsequently given fluid boluses and started on pressors.  Patient reports over the past several days she has not wanted to drink because she has not wanted to use the bathroom. She is noted to be profoundly hypovolemic.  She denies melena or hematochezia.  She denies chest pain.  Family wanted transfer to [facility] but they are on diversion.    1. Hypovolemic/septicshock with acute kidney injury: Patient has purposely not taking in adequate amounts of fluid which has ledto acute kidney injury and profoundhypotension. She has also been receiving her hypertensive medications at the facility while having decreased  oral intake which has decreased her BP significantly. Hemoglobin is stable. There is no evidence of hemorrhagic shock.  COVID negative UTI- ur cx with ecoli-finished Abx. - off pressors  -Clinically  Stable and shock resolved -Patient did not get approval for CIR.  She is refusing to go to skilled nursing facility.  Discharging her home with home health per patient request  2. Acute kidney injury with prerenal azotemia: Resolved with hydration  3. History of VTE: Hematology recommends apixaban, continue Eliquis 2.5 mg p.o. twice daily  4.  Chronic diastolic heart failure: Well compensated at this time, once blood pressure stabilizes can consider resuming beta-blocker, Entresto  Resume Bumex patient is reporting tightness in her extremities  5.  Chronic hypoxic respiratory failure with underlying history of COPD/obstructive sleep apnea: Stable at this time  6.  Hypotension with a history of hypertension: d/ced blood pressure medicine and muscle r elaxants  7.  Chronic pain managed with morphine pump  8.  Hypothyroidism: Continue Synthroid  9.  Peripheral arterial disease: Monitor on Eliquis - recent stenting in the left leg  10.  Weakness: Multifactorial -PT and OT recommends CIR .  Patient is agreeable,but pt didn't get CIR approval , pt is refusing to go to SNF, she wants to go home with home health  11.  Nonsustained V. tach possible remote telemetry [facility] reviewed  Tele strip did not find NSVtach other than PVCs, no new  recommendations as patient is back to normal sinus rhythm  DISCHARGE CONDITIONS:   Stable  CONSULTS OBTAINED:     PROCEDURES   DRUG ALLERGIES:  Allergies  Allergen Reactions  . Penicillins Anaphylaxis    BREATHING PROBLEMS  Patient tolerated Ceftriaxone September 2020  . Erythromycin Nausea And Vomiting and Rash    STOMACH PAIN  . Levofloxacin     Other reaction(s): Other MUSCLE ACHES  . Lidocaine Rash  . Clindamycin      Recurrent C diff  . Levothyroxine Other (See Comments)    Vaginal bleeding Patient used BRAND ONLY SYNTHROID  . Tiotropium Other (See Comments)    Coughing up blood  . Doxycycline Nausea And Vomiting  . Sulfa Antibiotics Rash    DISCHARGE MEDICATIONS:   Allergies as of 08/02/2019      Reactions   Penicillins Anaphylaxis   BREATHING PROBLEMS Patient tolerated Ceftriaxone September 2020   Erythromycin Nausea And Vomiting, Rash   STOMACH PAIN   Levofloxacin    Other reaction(s): Other MUSCLE ACHES   Lidocaine Rash   Clindamycin    Recurrent C diff   Levothyroxine Other (See Comments)   Vaginal bleeding Patient used BRAND ONLY SYNTHROID   Tiotropium Other (See Comments)   Coughing up blood   Doxycycline Nausea And Vomiting   Sulfa Antibiotics Rash      Medication List    STOP taking these medications   bisoprolol 5 MG tablet Commonly known as: ZEBETA   bumetanide 1 MG tablet Commonly known as: BUMEX   cyclobenzaprine 10 MG tablet Commonly known as: FLEXERIL   Eszopiclone 3 MG Tabs   lisinopril 20 MG tablet Commonly known as: ZESTRIL   sacubitril-valsartan 49-51 MG Commonly known as: ENTRESTO     TAKE these medications   acetaminophen 500 MG tablet Commonly known as: TYLENOL Take 500-1,000 mg by mouth every 8 (eight) hours as needed.   acidophilus Caps capsule Take 2 capsules by mouth 3 (three) times daily before meals.   albuterol 108 (90 Base) MCG/ACT inhaler Commonly known as: VENTOLIN HFA Inhale 2 puffs into the lungs every 6 (six) hours as needed for wheezing or shortness of breath.   apixaban 2.5 MG Tabs tablet Commonly known as: ELIQUIS Take 2.5 mg by mouth 2 (two) times daily.   Atrovent HFA 17 MCG/ACT inhaler Generic drug: ipratropium INHALE TWO PUFFS BY MOUTH FOUR TIMES A DAY   famotidine 20 MG tablet Commonly known as: PEPCID Take 20 mg by mouth 2 (two) times daily.   Fluticasone-Salmeterol 500-50 MCG/DOSE Aepb Commonly known as:  ADVAIR Inhale 1 puff into the lungs 2 (two) times daily.   gabapentin 300 MG capsule Commonly known as: NEURONTIN Take 300-600 mg by mouth 3 (three) times daily.   lidocaine 5 % Commonly known as: LIDODERM Place 1 patch onto the skin every 12 (twelve) hours.   liothyronine 25 MCG tablet Commonly known as: CYTOMEL Take 25 mcg by mouth daily.   metoCLOPramide 10 MG tablet Commonly known as: REGLAN Take 10 mg by mouth daily as needed.   midodrine 10 MG tablet Commonly known as: PROAMATINE Take 1 tablet (10 mg total) by mouth 3 (three) times daily with meals.   montelukast 10 MG tablet Commonly known as: SINGULAIR Take 10 mg by mouth daily.   multivitamin with minerals Tabs tablet Take 1 tablet by mouth daily. Start taking on: August 03, 2019   Narcan 4 MG/0.1ML Liqd nasal spray kit Generic drug: naloxone Place 1 spray into the nose once.   oxyCODONE 5 MG immediate release tablet Commonly known as: Roxicodone Take 1 tablet (5 mg total) by mouth every 6 (six) hours as needed. What  changed:   medication strength  how much to take  when to take this  reasons to take this   polyethylene glycol 17 g packet Commonly known as: MIRALAX / GLYCOLAX Take 17 g by mouth daily as needed for constipation.   Synthroid 200 MCG tablet Generic drug: levothyroxine Take 200 mcg by mouth daily.   Vitamin D (Cholecalciferol) 25 MCG (1000 UT) Tabs Take 2,000 Units by mouth daily.            Durable Medical Equipment  (From admission, onward)         Start     Ordered   08/02/19 1101  For home use only DME Hospital bed  Once    Question Answer Comment  Length of Need Lifetime   Patient has (list medical condition): WEAKNESS   Head must be elevated greater than: 45 degrees   Bed type Semi-electric   Hoyer Lift Yes   Trapeze Bar Yes      08/02/19 1100   08/02/19 1059  For home use only DME Bedside commode  Once    Question:  Patient needs a bedside commode to  treat with the following condition  Answer:  Weakness   08/02/19 1058           DISCHARGE INSTRUCTIONS:   Follow-up with primary care physician in 3 days Daily weights, intake and output Outpatient follow-up with cardiology in a week Follow-up with pain management as scheduled or in 2 weeks  DIET:  Cardiac diet  DISCHARGE CONDITION:  Fair  ACTIVITY:  Activity as tolerated  OXYGEN:  Home Oxygen: Yes.     Oxygen Delivery: 2.5  liters/min via Patient connected to nasal cannula oxygen  DISCHARGE LOCATION:  home   If you experience worsening of your admission symptoms, develop shortness of breath, life threatening emergency, suicidal or homicidal thoughts you must seek medical attention immediately by calling 911 or calling your MD immediately  if symptoms less severe.  You Must read complete instructions/literature along with all the possible adverse reactions/side effects for all the Medicines you take and that have been prescribed to you. Take any new Medicines after you have completely understood and accpet all the possible adverse reactions/side effects.   Please note  You were cared for by a hospitalist during your hospital stay. If you have any questions about your discharge medications or the care you received while you were in the hospital after you are discharged, you can call the unit and asked to speak with the hospitalist on call if the hospitalist that took care of you is not available. Once you are discharged, your primary care physician will handle any further medical issues. Please note that NO REFILLS for any discharge medications will be authorized once you are discharged, as it is imperative that you return to your primary care physician (or establish a relationship with a primary care physician if you do not have one) for your aftercare needs so that they can reassess your need for medications and monitor your lab values.     Today  Chief Complaint   Patient presents with  . Hypotension   Patient is doing fine.  Denies any complaints and wants to go home with home health  ROS:  CONSTITUTIONAL: Denies fevers, chills. Denies any fatigue, weakness.  EYES: Denies blurry vision, double vision, eye pain. EARS, NOSE, THROAT: Denies tinnitus, ear pain, hearing loss. RESPIRATORY: Denies cough, wheeze, shortness of breath.  CARDIOVASCULAR: Denies chest pain, palpitations, edema.  GASTROINTESTINAL:  Denies nausea, vomiting, diarrhea, abdominal pain. Denies bright red blood per rectum. GENITOURINARY: Denies dysuria, hematuria. ENDOCRINE: Denies nocturia or thyroid problems. HEMATOLOGIC AND LYMPHATIC: Denies easy bruising or bleeding. SKIN: Denies rash or lesion. MUSCULOSKELETAL: Denies pain in neck, back, shoulder, knees, hips or arthritic symptoms.  NEUROLOGIC: Denies paralysis, paresthesias.  PSYCHIATRIC: Denies anxiety or depressive symptoms.   VITAL SIGNS:  Blood pressure 104/67, pulse 84, temperature 98.7 F (37.1 C), temperature source Oral, resp. rate 17, height _0  (1.651 m), weight 103.5 kg, SpO2 93 %.  I/O:    Intake/Output Summary (Last 24 hours) at 08/02/2019 1140 Last data filed at 08/02/2019 1104 Gross per 24 hour  Intake 240 ml  Output 1325 ml  Net -1085 ml    PHYSICAL EXAMINATION:  GENERAL:  63 y.o.-year-old patient lying in the bed with no acute distress.  EYES: Pupils equal, round, reactive to light and accommodation. No scleral icterus. Extraocular muscles intact.  HEENT: Head atraumatic, normocephalic. Oropharynx and nasopharynx clear.  NECK:  Supple, no jugular venous distention. No thyroid enlargement, no tenderness.  LUNGS: Normal breath sounds bilaterally, no wheezing, rales,rhonchi or crepitation. No use of accessory muscles of respiration.  CARDIOVASCULAR: S1, S2 normal. No murmurs, rubs, or gallops.  ABDOMEN: Soft, non-tender, non-distended. Bowel sounds present. EXTREMITIES: No pedal edema, cyanosis,  or clubbing.  NEUROLOGIC: Cranial nerves II through XII are intact. Muscle strength weak in all extremities. Sensation intact. Gait not checked.  PSYCHIATRIC: The patient is alert and oriented x 3.  SKIN: No obvious rash, lesion, or ulcer.   DATA REVIEW:   CBC Recent Labs  Lab 08/02/19 0400  WBC 7.0  HGB 10.2*  HCT 34.9*  PLT 253    Chemistries  Recent Labs  Lab 07/29/19 1527 08/02/19 0400  NA 139  --   K 3.9  --   CL 107  --   CO2 24  --   GLUCOSE 121*  --   BUN 30*  --   CREATININE 0.83 0.80  CALCIUM 8.8*  --   MG 1.8  --     Cardiac Enzymes No results for input(s): TROPONINI in the last 168 hours.  Microbiology Results  Results for orders placed or performed during the hospital encounter of 07/22/19  Blood Culture (routine x 2)     Status: None   Collection Time: 07/22/19  9:40 AM   Specimen: BLOOD  Result Value Ref Range Status   Specimen Description BLOOD BLOOD LEFT HAND  Final   Special Requests   Final    BOTTLES DRAWN AEROBIC AND ANAEROBIC Blood Culture adequate volume   Culture   Final    NO GROWTH 5 DAYS Performed at [facility], [address], [place], [place] [address]    Report Status 07/27/2019 FINAL  Final  Blood Culture (routine x 2)     Status: None   Collection Time: 07/22/19  9:44 AM   Specimen: BLOOD  Result Value Ref Range Status   Specimen Description BLOOD BLOOD LEFT WRIST  Final   Special Requests   Final    BOTTLES DRAWN AEROBIC AND ANAEROBIC Blood Culture adequate volume   Culture   Final    NO GROWTH 5 DAYS Performed at [facility], [address] [place], [place] [address]    Report Status 07/27/2019 FINAL  Final  Urine culture     Status: Abnormal   Collection Time: 07/22/19 10:23 AM   Specimen: In/Out Cath Urine  Result Value Ref Range Status  Specimen Description   Final    IN/OUT CATH URINE Performed at [facility], [address] [place], [place] [address]    Special Requests    Final    NONE Performed at [facility], [address] [place], [place] [address]    Culture >=100,000 COLONIES/mL ESCHERICHIA COLI (A)  Final   Report Status 07/24/2019 FINAL  Final   Organism ID, Bacteria ESCHERICHIA COLI (A)  Final      Susceptibility   Escherichia coli - MIC*    AMPICILLIN >=32 RESISTANT Resistant     CEFAZOLIN <=4 SENSITIVE Sensitive     CEFTRIAXONE <=1 SENSITIVE Sensitive     CIPROFLOXACIN <=0.25 SENSITIVE Sensitive     GENTAMICIN <=1 SENSITIVE Sensitive     IMIPENEM <=0.25 SENSITIVE Sensitive     NITROFURANTOIN <=16 SENSITIVE Sensitive     TRIMETH/SULFA <=20 SENSITIVE Sensitive     AMPICILLIN/SULBACTAM 8 SENSITIVE Sensitive     PIP/TAZO <=4 SENSITIVE Sensitive     Extended ESBL NEGATIVE Sensitive     * >=100,000 COLONIES/mL ESCHERICHIA COLI  SARS Coronavirus 2 [facility] order, Performed in [facility] hospital lab) Nasopharyngeal Nasopharyngeal Swab     Status: None   Collection Time: 07/22/19 10:23 AM   Specimen: Nasopharyngeal Swab  Result Value Ref Range Status   SARS Coronavirus 2 NEGATIVE NEGATIVE Final    Comment: (NOTE) If result is NEGATIVE SARS-CoV-2 target nucleic acids are NOT DETECTED. The SARS-CoV-2 RNA is generally detectable in upper and lower  respiratory specimens during the acute phase of infection. The lowest  concentration of SARS-CoV-2 viral copies this assay can detect is 250  copies / mL. A negative result does not preclude SARS-CoV-2 infection  and should not be used as the sole basis for treatment or other  patient management decisions.  A negative result may occur with  improper specimen collection / handling, submission of specimen other  than nasopharyngeal swab, presence of viral mutation(s) within the  areas targeted by this assay, and inadequate number of viral copies  (<250 copies / mL). A negative result must be combined with clinical  observations, patient history, and epidemiological information. If result  is POSITIVE SARS-CoV-2 target nucleic acids are DETECTED. The SARS-CoV-2 RNA is generally detectable in upper and lower  respiratory specimens dur ing the acute phase of infection.  Positive  results are indicative of active infection with SARS-CoV-2.  Clinical  correlation with patient history and other diagnostic information is  necessary to determine patient infection status.  Positive results do  not rule out bacterial infection or co-infection with other viruses. If result is PRESUMPTIVE POSTIVE SARS-CoV-2 nucleic acids MAY BE PRESENT.   A presumptive positive result was obtained on the submitted specimen  and confirmed on repeat testing.  While 2019 novel coronavirus  (SARS-CoV-2) nucleic acids may be present in the submitted sample  additional confirmatory testing may be necessary for epidemiological  and / or clinical management purposes  to differentiate between  SARS-CoV-2 and other Sarbecovirus currently known to infect humans.  If clinically indicated additional testing with an alternate test  methodology [phone]) is advised. The SARS-CoV-2 RNA is generally  detectable in upper and lower respiratory sp ecimens during the acute  phase of infection. The expected result is Negative. Fact Sheet for Patients:  StrictlyIdeas.no Fact Sheet for Healthcare Providers: BankingDealers.co.za This test is not yet approved or cleared by the [place] FDA and has been authorized for detection and/or diagnosis of SARS-CoV-2 by FDA under  an Emergency Use Authorization (EUA).  This EUA will remain in effect (meaning this test can be used) for the duration of the COVID-19 declaration under Section 564(b)(1) of the Act, 21 U.S.C. section 360bbb-3(b)(1), unless the authorization is terminated or revoked sooner. Performed at [facility], [address] [place], [place] [address]   MRSA PCR Screening     Status: None   Collection  Time: 07/22/19 12:57 PM   Specimen: Nasopharyngeal  Result Value Ref Range Status   MRSA by PCR NEGATIVE NEGATIVE Final    Comment:        The GeneXpert MRSA Assay (FDA approved for NASAL specimens only), is one component of a comprehensive MRSA colonization surveillance program. It is not intended to diagnose MRSA infection nor to guide or monitor treatment for MRSA infections. Performed at [facility] [address], [place], [place] [address]     RADIOLOGY:  No results found.  EKG:   Orders placed or performed during the hospital encounter of 07/22/19  . ED EKG 12-Lead  . ED EKG 12-Lead  . EKG 12-Lead  . EKG 12-Lead      Management plans discussed with the patient, family and they are in agreement.  CODE STATUS:     Code Status Orders  (From admission, onward)         Start     Ordered   07/22/19 1222  Full code  Continuous     07/22/19 1221        Code Status History    Date Active Date Inactive Code Status Order ID Comments User Context   12/04/2018 1806 12/04/2018 1806 Full Code [id number]  Fritzi Mandes, MD ED   06/02/2018 1722 06/03/2018 0011 Full Code [id number]  Loletha Grayer, MD Inpatient   Advance Care Planning Activity      TOTAL TIME TAKING CARE OF THIS PATIENT: 45 minutes.   Note: This dictation was prepared with Dragon dictation along with smaller phrase technology. Any transcriptional errors that result from this process are unintentional.   _0 @  on 08/02/2019 at 11:40 AM  Between 7am to 6pm - Pager - [phone]  After 6pm go to [email or web address] - password [email or web address] [place] [facility]  Office  [phone]  CC: Primary care physician; Jacklynn Barnacle, MD

## 2019-08-02 NOTE — Progress Notes (Addendum)
Pt continues to rate pain 8/10 after pain medication given. Pt states she did not want anything stronger. No distress noted.

## 2019-08-02 NOTE — Care Management (Signed)
Patient suffers from weakness which impairs their ability to perform daily activities like ADLs in the home.  A walking aid will not resolve  issue with performing activities of daily living. A wheelchair will allow patient to safely perform daily activities. Patient is not able to propel themselves in the home using a standard weight wheelchair due to weakness. Patient can self propel in the lightweight wheelchair. Length of need 99 years.

## 2019-08-04 ENCOUNTER — Encounter: Payer: Self-pay | Admitting: Emergency Medicine

## 2019-08-04 ENCOUNTER — Emergency Department
Admission: EM | Admit: 2019-08-04 | Discharge: 2019-08-07 | Disposition: A | Discharge status: Skilled Nursing Facility | Payer: 59 | Attending: Emergency Medicine | Admitting: Emergency Medicine

## 2019-08-04 DIAGNOSIS — R52 Pain, unspecified: Secondary | ICD-10-CM

## 2019-08-04 DIAGNOSIS — Z20828 Contact with and (suspected) exposure to other viral communicable diseases: Secondary | ICD-10-CM | POA: Insufficient documentation

## 2019-08-04 DIAGNOSIS — J449 Chronic obstructive pulmonary disease, unspecified: Secondary | ICD-10-CM | POA: Diagnosis not present

## 2019-08-04 DIAGNOSIS — Z87891 Personal history of nicotine dependence: Secondary | ICD-10-CM | POA: Diagnosis not present

## 2019-08-04 DIAGNOSIS — I509 Heart failure, unspecified: Secondary | ICD-10-CM | POA: Diagnosis not present

## 2019-08-04 DIAGNOSIS — I110 Hypertensive heart disease with heart failure: Secondary | ICD-10-CM | POA: Insufficient documentation

## 2019-08-04 DIAGNOSIS — L8990 Pressure ulcer of unspecified site, unspecified stage: Secondary | ICD-10-CM

## 2019-08-04 DIAGNOSIS — N3000 Acute cystitis without hematuria: Secondary | ICD-10-CM

## 2019-08-04 DIAGNOSIS — L89312 Pressure ulcer of right buttock, stage 2: Secondary | ICD-10-CM | POA: Diagnosis not present

## 2019-08-04 DIAGNOSIS — L89322 Pressure ulcer of left buttock, stage 2: Secondary | ICD-10-CM | POA: Insufficient documentation

## 2019-08-04 DIAGNOSIS — E039 Hypothyroidism, unspecified: Secondary | ICD-10-CM | POA: Insufficient documentation

## 2019-08-04 DIAGNOSIS — R531 Weakness: Secondary | ICD-10-CM | POA: Insufficient documentation

## 2019-08-04 LAB — CBC WITH DIFFERENTIAL/PLATELET
Abs Immature Granulocytes: 0.04 10*3/uL (ref 0.00–0.07)
Basophils Absolute: 0.1 10*3/uL (ref 0.0–0.1)
Basophils Relative: 1 %
Eosinophils Absolute: 0.2 10*3/uL (ref 0.0–0.5)
Eosinophils Relative: 2 %
HCT: 40.1 % (ref 36.0–46.0)
Hemoglobin: 11.9 g/dL — ABNORMAL LOW (ref 12.0–15.0)
Immature Granulocytes: 0 %
Lymphocytes Relative: 9 %
Lymphs Abs: 1 10*3/uL (ref 0.7–4.0)
MCH: 27.7 pg (ref 26.0–34.0)
MCHC: 29.7 g/dL — ABNORMAL LOW (ref 30.0–36.0)
MCV: 93.5 fL (ref 80.0–100.0)
Monocytes Absolute: 0.6 10*3/uL (ref 0.1–1.0)
Monocytes Relative: 6 %
Neutro Abs: 8.3 10*3/uL — ABNORMAL HIGH (ref 1.7–7.7)
Neutrophils Relative %: 82 %
Platelets: 243 10*3/uL (ref 150–400)
RBC: 4.29 MIL/uL (ref 3.87–5.11)
RDW: 16.7 % — ABNORMAL HIGH (ref 11.5–15.5)
WBC: 10.1 10*3/uL (ref 4.0–10.5)
nRBC: 0 % (ref 0.0–0.2)

## 2019-08-04 LAB — URINALYSIS, COMPLETE (UACMP) WITH MICROSCOPIC
Bacteria, UA: NONE SEEN
Bilirubin Urine: NEGATIVE
Glucose, UA: NEGATIVE mg/dL
Hgb urine dipstick: NEGATIVE
Ketones, ur: NEGATIVE mg/dL
Nitrite: NEGATIVE
Protein, ur: NEGATIVE mg/dL
Specific Gravity, Urine: 1.024 (ref 1.005–1.030)
pH: 5 (ref 5.0–8.0)

## 2019-08-04 LAB — COMPREHENSIVE METABOLIC PANEL
ALT: 9 U/L (ref 0–44)
AST: 19 U/L (ref 15–41)
Albumin: 2.8 g/dL — ABNORMAL LOW (ref 3.5–5.0)
Alkaline Phosphatase: 59 U/L (ref 38–126)
Anion gap: 5 (ref 5–15)
BUN: 19 mg/dL (ref 8–23)
CO2: 23 mmol/L (ref 22–32)
Calcium: 7 mg/dL — ABNORMAL LOW (ref 8.9–10.3)
Chloride: 113 mmol/L — ABNORMAL HIGH (ref 98–111)
Creatinine, Ser: 0.66 mg/dL (ref 0.44–1.00)
GFR calc Af Amer: >60 mL/min (ref >60)
GFR calc non Af Amer: >60 mL/min (ref >60)
Glucose, Bld: 80 mg/dL (ref 70–99)
Potassium: 3.2 mmol/L — ABNORMAL LOW (ref 3.5–5.1)
Sodium: 141 mmol/L (ref 135–145)
Total Bilirubin: 0.9 mg/dL (ref 0.3–1.2)
Total Protein: 4.8 g/dL — ABNORMAL LOW (ref 6.5–8.1)

## 2019-08-04 LAB — TROPONIN I (HIGH SENSITIVITY): Troponin I (High Sensitivity): 15 ng/L (ref <18)

## 2019-08-04 MED ORDER — APIXABAN 2.5 MG PO TABS
2.5000 mg | ORAL_TABLET | Freq: Two times a day (BID) | ORAL | Status: DC
  Administered 2019-08-05 – 2019-08-07 (×6): 2.5 mg via ORAL
  Filled 2019-08-04 (×7): qty 1

## 2019-08-04 MED ORDER — IPRATROPIUM-ALBUTEROL 0.5-2.5 (3) MG/3ML IN SOLN: 3.0000 mL | Freq: Four times a day (QID) | RESPIRATORY_TRACT | Status: DC | PRN

## 2019-08-04 MED ORDER — METOCLOPRAMIDE HCL 10 MG PO TABS: 10.0000 mg | ORAL_TABLET | Freq: Every day | ORAL | Status: DC | PRN

## 2019-08-04 MED ORDER — POLYETHYLENE GLYCOL 3350 17 G PO PACK: 17.0000 g | PACK | Freq: Every day | ORAL | Status: DC | PRN

## 2019-08-04 MED ORDER — GABAPENTIN 300 MG PO CAPS
300.0000 mg | ORAL_CAPSULE | Freq: Three times a day (TID) | ORAL | Status: DC
  Administered 2019-08-04: 600 mg via ORAL
  Administered 2019-08-05 (×3): 300 mg via ORAL
  Administered 2019-08-06 (×2): 600 mg via ORAL
  Administered 2019-08-06: 11:00:00 300 mg via ORAL
  Administered 2019-08-07: 10:00:00 600 mg via ORAL
  Filled 2019-08-04: qty 2
  Filled 2019-08-04: qty 1
  Filled 2019-08-04 (×2): qty 2
  Filled 2019-08-04: qty 1
  Filled 2019-08-04: qty 2
  Filled 2019-08-04 (×2): qty 1

## 2019-08-04 MED ORDER — MONTELUKAST SODIUM 10 MG PO TABS
10.0000 mg | ORAL_TABLET | Freq: Every day | ORAL | Status: DC
  Administered 2019-08-05 – 2019-08-07 (×3): 10 mg via ORAL
  Filled 2019-08-04 (×3): qty 1

## 2019-08-04 MED ORDER — FAMOTIDINE 20 MG PO TABS
20.0000 mg | ORAL_TABLET | Freq: Two times a day (BID) | ORAL | Status: DC
  Administered 2019-08-04 – 2019-08-07 (×6): 20 mg via ORAL
  Filled 2019-08-04 (×6): qty 1

## 2019-08-04 MED ORDER — LEVOTHYROXINE SODIUM 50 MCG PO TABS
200.0000 ug | ORAL_TABLET | Freq: Every day | ORAL | Status: DC
  Administered 2019-08-05 – 2019-08-07 (×3): 200 ug via ORAL
  Filled 2019-08-04 (×4): qty 4

## 2019-08-04 MED ORDER — HYDROMORPHONE HCL 1 MG/ML IJ SOLN
1.0000 mg | Freq: Once | INTRAMUSCULAR | Status: AC
  Administered 2019-08-04: 1 mg via INTRAVENOUS
  Filled 2019-08-04: qty 1

## 2019-08-04 MED ORDER — RISAQUAD PO CAPS
2.0000 | ORAL_CAPSULE | Freq: Three times a day (TID) | ORAL | Status: DC
  Administered 2019-08-05 – 2019-08-07 (×7): 2 via ORAL
  Filled 2019-08-04 (×10): qty 2

## 2019-08-04 MED ORDER — ADULT MULTIVITAMIN W/MINERALS CH
1.0000 | ORAL_TABLET | Freq: Every day | ORAL | Status: DC
  Administered 2019-08-04 – 2019-08-07 (×4): 1 via ORAL
  Filled 2019-08-04 (×4): qty 1

## 2019-08-04 MED ORDER — SODIUM CHLORIDE 0.9 % IV SOLN
1.0000 g | Freq: Once | INTRAVENOUS | Status: DC
  Filled 2019-08-04: qty 10

## 2019-08-04 MED ORDER — VITAMIN D 25 MCG (1000 UNIT) PO TABS
2000.0000 [IU] | ORAL_TABLET | Freq: Every day | ORAL | Status: DC
  Administered 2019-08-05 – 2019-08-07 (×3): 2000 [IU] via ORAL
  Filled 2019-08-04 (×3): qty 2

## 2019-08-04 MED ORDER — NALOXONE HCL 4 MG/0.1ML NA LIQD: 1.0000 | Freq: Once | NASAL | Status: DC

## 2019-08-04 MED ORDER — OXYCODONE HCL 5 MG PO TABS
5.0000 mg | ORAL_TABLET | Freq: Four times a day (QID) | ORAL | Status: DC | PRN
  Administered 2019-08-05 – 2019-08-07 (×6): 5 mg via ORAL
  Filled 2019-08-04 (×6): qty 1

## 2019-08-04 MED ORDER — CALCIUM GLUCONATE-NACL 1-0.675 GM/50ML-% IV SOLN
1.0000 g | Freq: Once | INTRAVENOUS | Status: AC
  Administered 2019-08-05: 1000 mg via INTRAVENOUS
  Filled 2019-08-04: qty 50

## 2019-08-04 MED ORDER — MIDODRINE HCL 5 MG PO TABS
10.0000 mg | ORAL_TABLET | Freq: Three times a day (TID) | ORAL | Status: DC
  Administered 2019-08-05 – 2019-08-07 (×7): 10 mg via ORAL
  Filled 2019-08-04 (×10): qty 2

## 2019-08-04 MED ORDER — ALBUTEROL SULFATE (2.5 MG/3ML) 0.083% IN NEBU: 5.0000 mg | INHALATION_SOLUTION | Freq: Four times a day (QID) | RESPIRATORY_TRACT | Status: DC | PRN

## 2019-08-04 MED ORDER — FLUTICASONE FUROATE-VILANTEROL 200-25 MCG/INH IN AEPB
1.0000 | INHALATION_SPRAY | Freq: Every day | RESPIRATORY_TRACT | Status: DC
  Administered 2019-08-05 – 2019-08-07 (×3): 1 via RESPIRATORY_TRACT
  Filled 2019-08-04: qty 28

## 2019-08-04 MED ORDER — ACETAMINOPHEN 500 MG PO TABS
500.0000 mg | ORAL_TABLET | Freq: Three times a day (TID) | ORAL | Status: DC | PRN
  Administered 2019-08-07: 1000 mg via ORAL
  Filled 2019-08-04: qty 2

## 2019-08-04 MED ORDER — LIOTHYRONINE SODIUM 25 MCG PO TABS
25.0000 ug | ORAL_TABLET | Freq: Every day | ORAL | Status: DC
  Filled 2019-08-04 (×3): qty 1

## 2019-08-04 MED ORDER — CIPROFLOXACIN IN D5W 400 MG/200ML IV SOLN
400.0000 mg | Freq: Two times a day (BID) | INTRAVENOUS | Status: DC
  Administered 2019-08-04 – 2019-08-07 (×6): 400 mg via INTRAVENOUS
  Filled 2019-08-04 (×6): qty 200

## 2019-08-04 NOTE — ED Triage Notes (Signed)
Pt to ED by EMS from home with c/o of generalized pain that has been ongoing since being discharged from here on Thursday where pt was treated for LLE DVT and right arm bleed. Home health nurse arrived today and found pt in same place she had been dropped off by EMS after being discharged. Pt was discharged on 2L of O2.

## 2019-08-04 NOTE — ED Provider Notes (Signed)
[facility] Emergency Department Provider Note       Time seen: ----------------------------------------- 4:31 PM on 08/04/2019 -----------------------------------------   I have reviewed the triage vital signs and the nursing notes.  HISTORY   Chief Complaint generalized pain    HPI Regina White is a 63 y.o. female with a history of asthma, CHF, chronic back pain, COPD, pulmonary hypertension, recent hypovolemic and septic shock who presents to the ED for generalized pain that is been ongoing since she was discharged from here on Thursday.  Home health nurse arrived today and found her in the same place she was in after she was dropped off by EMS.  She was discharged on 2 L of oxygen.  She is complaining of generalized pain.  Past Medical History:  Diagnosis Date  . Asthma   . CHF (congestive heart failure) (HCC)   . Chronic back pain   . COPD (chronic obstructive pulmonary disease) (HCC)   . Pulmonary hypertension [facility])     Patient Active Problem List   Diagnosis Date Noted  . Hypovolemic shock (HCC) 07/22/2019  . Acute respiratory failure with hypoxemia (HCC) 12/04/2018  . Acute respiratory failure with hypoxia (HCC) 06/02/2018  . Diarrhea of presumed infectious origin 02/17/2016  . Right acute serous otitis media 02/17/2016  . COPD (chronic obstructive pulmonary disease) (HCC) 12/23/2015  . Heart failure with preserved ejection fraction (HCC) 12/23/2015  . DDD (degenerative disc disease) 09/25/2013  . Narrowing of intervertebral disc space 09/25/2013  . OSA (obstructive sleep apnea) 07/25/2013  . Deep vein thrombosis (DVT) (HCC) 06/25/2013  . Erythema 06/18/2013  . Left leg pain 06/18/2013  . Mediastinal lymphadenopathy 06/18/2013  . DVT, lower extremity (HCC) 06/14/2013  . Pulmonary hypertension (HCC) 06/14/2013  . Asthma 09/21/2010  . Chronic pain 09/21/2010  . Hypertension, benign 09/21/2010  . Hypothyroidism 09/21/2010  .  Venous stasis 09/21/2010    Past Surgical History:  Procedure Laterality Date  . BACK SURGERY      Allergies Penicillins, Erythromycin, Levofloxacin, Lidocaine, Clindamycin, Levothyroxine, Tiotropium, Doxycycline, and Sulfa antibiotics  Social History Social History   Tobacco Use  . Smoking status: Former Smoker    Packs/day: 1.00    Years: 30.00    Pack years: 30.00    Types: Cigarettes    Quit date: 06/09/1999    Years since quitting: 20.1  . Smokeless tobacco: Never Used  Substance Use Topics  . Alcohol use: No  . Drug use: Never   Review of Systems Constitutional: Negative for fever. Cardiovascular: Negative for chest pain. Respiratory: Negative for shortness of breath. Gastrointestinal: Negative for abdominal pain, vomiting and diarrhea. Musculoskeletal: Negative for back pain.  Positive for generalized pain Skin: Negative for rash. Neurological: Negative for headaches, focal weakness or numbness.  All systems negative/normal/unremarkable except as stated in the HPI  ____________________________________________   PHYSICAL EXAM:  VITAL SIGNS: ED Triage Vitals  Enc Vitals Group     BP      Pulse      Resp      Temp      Temp src      SpO2      Weight      Height      Head Circumference      Peak Flow      Pain Score      Pain Loc      Pain Edu?      Excl. in GC?    Constitutional: Alert and oriented.  No  acute distress Eyes: Conjunctivae are normal. Normal extraocular movements. ENT      Head: Normocephalic and atraumatic.      Nose: No congestion/rhinnorhea.      Mouth/Throat: Mucous membranes are moist.      Neck: No stridor. Cardiovascular: Normal rate, regular rhythm. No murmurs, rubs, or gallops. Respiratory: Normal respiratory effort without tachypnea nor retractions. Breath sounds are clear and equal bilaterally. No wheezes/rales/rhonchi. Gastrointestinal: Soft and nontender. Normal bowel sounds Musculoskeletal: Nontender with normal  range of motion in extremities. No lower extremity tenderness nor edema. Neurologic:  Normal speech and language. No gross focal neurologic deficits are appreciated.  Skin:  Skin is warm, dry and intact. No rash noted. Psychiatric: Mood and affect are normal. Speech and behavior are normal.  ____________________________________________  ED COURSE:  As part of my medical decision making, I reviewed the following data within the [place] History obtained from family if available, nursing notes, old chart and ekg, as well as notes from prior ED visits. Patient presented for generalized pain, we will assess with labs and imaging as indicated at this time.   Procedures  [name] was evaluated in Emergency Department on 08/04/2019 for the symptoms described in the history of present illness. She was evaluated in the context of the global COVID-19 pandemic, which necessitated consideration that the patient might be at risk for infection with the SARS-CoV-2 virus that causes COVID-19. Institutional protocols and algorithms that pertain to the evaluation of patients at risk for COVID-19 are in a state of rapid change based on information released by regulatory bodies including the CDC and federal and state organizations. These policies and algorithms were followed during the patient's care in the ED.  ____________________________________________   LABS (pertinent positives/negatives)  Labs Reviewed  CBC WITH DIFFERENTIAL/PLATELET - Abnormal; Notable for the following components:      Result Value   Hemoglobin 11.9 (*)    MCHC 29.7 (*)    RDW 16.7 (*)    Neutro Abs 8.3 (*)    All other components within normal limits  COMPREHENSIVE METABOLIC PANEL - Abnormal; Notable for the following components:   Potassium 3.2 (*)    Chloride 113 (*)    Calcium 7.0 (*)    Total Protein 4.8 (*)    Albumin 2.8 (*)    All other components within normal limits  URINALYSIS, COMPLETE  (UACMP) WITH MICROSCOPIC  TROPONIN I (HIGH SENSITIVITY)   ____________________________________________   DIFFERENTIAL DIAGNOSIS   Dehydration, electrolyte abnormality, chronic pain, general debility  FINAL ASSESSMENT AND PLAN  Weakness, generalized pain   Plan: The patient had presented for weakness and generalized pain after recent hospitalization. Patient's labs did not reveal any acute process.  She does not meet admission criteria.  We will consult social work for placement.   Laurence Aly, MD    Note: This note was generated in part or whole with voice recognition software. Voice recognition is usually quite accurate but there are transcription errors that can and very often do occur. I apologize for any typographical errors that were not detected and corrected.     Earleen Newport, MD 08/04/19 [phone]

## 2019-08-04 NOTE — ED Provider Notes (Signed)
Corrected calcium is 8.0   Earleen Newport, MD 08/04/19 2052

## 2019-08-05 ENCOUNTER — Emergency Department: Payer: 59

## 2019-08-05 LAB — BRAIN NATRIURETIC PEPTIDE: B Natriuretic Peptide: 168 pg/mL — ABNORMAL HIGH (ref 0.0–100.0)

## 2019-08-05 MED ORDER — BUMETANIDE 1 MG PO TABS
1.0000 mg | ORAL_TABLET | ORAL | Status: DC
  Administered 2019-08-05 – 2019-08-07 (×2): 1 mg via ORAL
  Filled 2019-08-05 (×2): qty 1

## 2019-08-05 NOTE — ED Notes (Addendum)
Lidocaine patch was placed on 07/19/19 and was removed today. Once removed, this nurse and Vicente Males, RN noted dark bruising underneath area with small skin tear and swelling. Dr. Cinda Quest notified and came to bedside, ordered US.

## 2019-08-05 NOTE — ED Notes (Signed)
Pt given breakfast tray at this time. 

## 2019-08-05 NOTE — ED Notes (Signed)
Patient's IV is no longer working, does not flush and was swelling at the site.  Patient states she always needs IV team to start her IVs and does not want these nurses to attempt if possible.  Order for IV team placed.

## 2019-08-05 NOTE — ED Notes (Signed)
This RN at bedside for monitor alarm. sats low.  O2 had come unhooked, patient increased to 6 L [place] to bring sats up, then turned down to 4 L.  sats remain 92-949% on 4 L [place].  Primary RN notified.

## 2019-08-05 NOTE — ED Notes (Signed)
Patient states her buttocks has been hurting since prior to leaving the hospital on Thursday.  Patient states she was told by the nurses that she had a "blister" on her bottom.

## 2019-08-05 NOTE — ED Notes (Addendum)
This nurse and Vicente Males, RN giving bed bath to patient and noticed reddened skin in multiple areas on bottom and ankle. Notified Dr. Cinda Quest and put tegaderm onto raw skin areas and a duoderm on the sacrum per MD order.

## 2019-08-05 NOTE — Evaluation (Signed)
Physical Therapy Evaluation Patient Details Name: Regina White MRN: [id number] DOB: Jun 24, 1956 Today's Date: 08/05/2019   History of Present Illness  Pt is a 63 y.o. female who was came to the hospital 07/22/19 with hypotension.  Admitted with hypovolemic shock with acute kidney injury, acute kidney injury with pre-renal azotomia, h/o VTE, and HFrEF.  Recent h/o R UE edematous and bruised (hematoma).  Pt with recent admit to [facility] for hemorrhagic shock d/t acute blood loss anemia in setting of supratherapeutic Lovenox after recent vascular stent procedure (pt has been at [facility] since 9/10).  PMH includes active morphine pump L abdomen, asthma, CHF, COPD, pulmonary htn, chronic back pain, h/o back surgery, PAD, recurent c-diff infections.  She has returned with report of pain in L LE and R UE and reports that she was unable to work with HHPT.  Clinical Impression  Pt is a 63 year old female who recently discharged home from hospital and has not been able to perform toilet transfers since.  She is very eager to work with therapy but also hyperaware of pain in L LE and R UE.  She could tolerate very little MMT but presented with fair L UE strength and was able to perform partial SLR of R LE.  Pt able to bring R LE over EOB but required Total A for L LE.  PT provided trunk support and RN assisted with LE's but pt ultimately reported too much pain and PT and RN assisted pt back to supine.  Pt tearful and stating that she does not want to go back home in this condition. PT took time to explain benefit of SNF and pt was agreeable.  Pt will continue to benefit from skilled PT with focus on strength, safe functional mobility, tolerance to activity and pain management.    Follow Up Recommendations SNF    Equipment Recommendations  Other (comment)(TBD at next venue of care.)    Recommendations for Other Services OT consult     Precautions / Restrictions Precautions Precautions:  Fall Restrictions Weight Bearing Restrictions: No RUE Weight Bearing: Weight bearing as tolerated      Mobility  Bed Mobility Overal bed mobility: Needs Assistance Bed Mobility: Supine to Sit     Supine to sit: Max assist;+2 for physical assistance Sit to supine: +2 for physical assistance;Max assist   General bed mobility comments: Pt very anxious and determined to get to EOB.  Required assistance to elevate trunk and to bring LE's towards EOB.  Able to move R LE to EOB without assistance.  Pt became very anxious and RN and PT returned pt to supine.  Transfers                    Ambulation/Gait                Stairs            Wheelchair Mobility    Modified Rankin (Stroke Patients Only)       Balance                                             Pertinent Vitals/Pain Pain Assessment: Faces Faces Pain Scale: Hurts whole lot Pain Location: L anterior thigh and R UE (in sling). Pain Descriptors / Indicators: Aching;Guarding;Crying;Grimacing Pain Intervention(s): Limited activity within patient's tolerance;Monitored during session    Home  Living Family/patient expects to be discharged to:: Skilled nursing facility Living Arrangements: Spouse/significant other Available Help at Discharge: Family Type of Home: House Home Access: Stairs to enter   [employer] of Steps: Bi-level: 6 steps with B railings (not able to reach both) plus 6 steps with B railings (able to reach both) to main level Home Layout: (Bi-level--able to live on one level) Home Equipment: Walker - standard;Walker - 4 wheels;Shower seat;Grab bars - toilet;Grab bars - tub/shower      Prior Function Level of Independence: Independent with assistive device(s)         Comments: Pt was able to walk prior to first hospital admission but has not performed transfers and has been using a bedpan since recent discharge.     Hand Dominance         Extremity/Trunk Assessment   Upper Extremity Assessment Upper Extremity Assessment: (L UE grip strength: good, elbow flexion/extension: 4-/5, shoulder flexion: 3+/5) RUE: Unable to fully assess due to pain    Lower Extremity Assessment RLE Deficits / Details: Able to perform partial SLR and hip abduction to get to EOB.  Ankle PF: 4/5, DF: 4-/5 LLE: Unable to fully assess due to pain       Communication   Communication: No difficulties  Cognition Arousal/Alertness: Awake/alert Behavior During Therapy: WFL for tasks assessed/performed;Restless;Anxious Overall Cognitive Status: Within Functional Limits for tasks assessed                                 General Comments: Willing to work with therapy and apologetic when she was unable to sit at EOB.      General Comments      Exercises Other Exercises Other Exercises: Time to assist pt with deep breathing and discuss STR benefits.  Pt does not want to return home at this point.  x4 min   Assessment/Plan    PT Assessment Patient needs continued PT services  PT Problem List Decreased strength;Decreased mobility;Decreased activity tolerance;Decreased balance;Decreased knowledge of use of DME;Decreased range of motion;Pain       PT Treatment Interventions DME instruction;Therapeutic exercise;Gait training;Balance training;Stair training;Functional mobility training;Therapeutic activities;Patient/family education    PT Goals (Current goals can be found in the Care Plan section)  Acute Rehab PT Goals Patient Stated Goal: to do as much as she can to get better, be able to walk again PT Goal Formulation: With patient Time For Goal Achievement: 08/07/19 Potential to Achieve Goals: Fair    Frequency Min 2X/week   Barriers to discharge        Co-evaluation               AM-PAC PT "6 Clicks" Mobility  Outcome Measure Help needed turning from your back to your side while in a flat bed without using  bedrails?: A Lot Help needed moving from lying on your back to sitting on the side of a flat bed without using bedrails?: A Lot Help needed moving to and from a bed to a chair (including a wheelchair)?: Total Help needed standing up from a chair using your arms (e.g., wheelchair or bedside chair)?: Total Help needed to walk in hospital room?: Total Help needed climbing 3-5 steps with a railing? : Total 6 Click Score: 8    End of Session Equipment Utilized During Treatment: Oxygen(2 L O2 via nasal cannula) Activity Tolerance: Patient limited by pain Patient left: in bed;with call bell/phone within reach;Other (comment)(B  heels floating via pillows) Nurse Communication: Mobility status(Nursing in room.) PT Visit Diagnosis: Other abnormalities of gait and mobility (R26.89);Muscle weakness (generalized) (M62.81);Difficulty in walking, not elsewhere classified (R26.2);Pain Pain - Right/Left: Right(L thigh) Pain - part of body: Arm    Time: 6415-8309 PT Time Calculation (min) (ACUTE ONLY): 23 min   Charges:   PT Evaluation $PT Eval Moderate Complexity: 1 Mod          Roxanne Gates, PT, DPT   Roxanne Gates 08/05/2019, 9:50 AM

## 2019-08-05 NOTE — ED Notes (Signed)
Responded to call bell. Pt reports full suction canister from purewick and stated that she felt like her chux pad was soaked. Cannister emptied total of 800 mL of urine. Pt bed linens changed and new chux pad placed. Peri care provided and patient placed in brief. Purewick placement adjusted. Pt R side elevated with pillow. Will adjust in 2 hours.

## 2019-08-05 NOTE — Social Work (Signed)
TOC CM/SW completed assessment per ED attending provider's request.   Plan/next step: waiting for SNF bed offers.    Berenice Bouton, MSW, LCSW  [phone] 8am-6pm (weekends) or CSW ED # [phone]

## 2019-08-05 NOTE — Social Work (Addendum)
TOC CM/SW consult received.   Chart reviewed and  assessment in progress.  SW will consult w/attending.   [place] notified and updated on pt's status.    Berenice Bouton, MSW, LCSW  [phone] 8am-6pm (weekends) or CSW ED # [phone]

## 2019-08-05 NOTE — ED Notes (Signed)
Pt O2 sat at 92% with good wave form. Noticed O2 was set at 2L via [place]. Adjusted to 4L, O2 sat improved to 96%.

## 2019-08-05 NOTE — ED Provider Notes (Signed)
Called to see patients his nurses were giving her a bath.  She has some abrasions on the left hip which are raw she has 2 $0.50 piece size stage II decubitus with skin breakdown only on both buttocks and a large bruise on the right breast that had a Lidoderm patch on it.  This was removed and there is a small pinhead sized defect in the skin underneath of it.  This bruise it was covered by the Lidoderm patches very firm and somewhat tender.  We will get an ultrasound to make sure there is no abscess in there.  We will put Tegaderm on the abrasions on the hip and DuoDERM on the decubiti.  Shame for her to get severe decubiti while she is waiting in the hospital for bed elsewhere.   Nena Polio, MD 08/05/19 [phone]

## 2019-08-05 NOTE — ED Notes (Signed)
This RN informed RN Vicente Males, that she noted pressure ulcers to both buttocks upon pt's arrival to ED the previous day. This RN assessed both wounds while placing pt on chux pad and placing purewick.

## 2019-08-05 NOTE — TOC Initial Note (Signed)
Transition of Care [facility]) - Initial/Assessment Note    Patient Details  Name: Regina White MRN: [id number] Date of Birth: [birth year]  [facility] of [facility]) CM/SW Contact:    Berenice Bouton, LCSW Phone Number: 08/05/2019, 12:38 PM  Clinical Narrative:   CSW met with patient and her husband at bedside.  Discussed with the patient that her attending ED physician and physical therapy both recommend discharge to skills nursing facility.  Patient stated that she agreed with recommendations for SNF and gave social worker permission to start search for bed offers at SNF in the /[place] area.     Historical: Pt recommended skills nursing facility at on 07/27/2019 however patient declined and accepted HH. Equipment recommendation per PT "Rolling walker with 5" wheels;3in1 (PT); Wheelchair (measurements PT); Wheelchair cushion (measurements PT); Hospital bed (hoyer lift)"   Expected Discharge Plan: [place] Barriers to Discharge: SNF Pending bed offer   Patient Goals and CMS Choice Patient states their goals for this hospitalization and ongoing recovery are:: Skills nursing facility - [place] CMS Medicare.gov Compare Post Acute Care list provided to:: Patient Choice offered to / list presented to : Patient  Expected Discharge Plan and Services Expected Discharge Plan: [place] In-house Referral: Clinical Social Work Discharge Planning Services: NA Post Acute Care Choice: [place] Living arrangements for the past 2 months: [place]                   Prior Living Arrangements/Services Living arrangements for the past 2 months: Single Family Home Lives with:: Spouse Patient language and need for interpreter reviewed:: No Do you feel safe going back to the place where you live?: Yes      Need for Family Participation in Patient Care: No (Comment) Care giver support system in place?: Yes  (comment)(Husband is very a good support) Current home services: DME(Rolling walker with 5" wheels;3in1 (PT); Wheelchair; hoyer lift) Criminal Activity/Legal Involvement Pertinent to Current Situation/Hospitalization: No - Comment as needed  Activities of Daily Living: Need assisstance     Permission Sought/Granted Permission sought to share information with : Family Supports, Case Freight forwarder, Chartered certified accountant granted to share information with : Yes, Verbal Permission Granted  Share Information with NAME: Amberlin Utke, husband, [phone]  Permission granted to share info w AGENCY: Skills nursing facilities  Permission granted to share info w Relationship: Laurenashley Viar, son, [phone]; Osa Craver [phone]     Emotional Assessment Appearance:: Appears stated age Attitude/Demeanor/Rapport: Engaged Affect (typically observed): Flat Orientation: : Oriented to Self, Oriented to Place, Oriented to  Time, Oriented to Situation Alcohol / Substance Use: Not Applicable Psych Involvement: No (comment)  Admission diagnosis:  Generalized Body Aches Patient Active Problem List   Diagnosis Date Noted  . Hypovolemic shock ([place]) 07/22/2019  . Acute respiratory failure with hypoxemia ([place]) 12/04/2018  . Acute respiratory failure with hypoxia ([place]) 06/02/2018  . Diarrhea of presumed infectious origin 02/17/2016  . Right acute serous otitis media 02/17/2016  . COPD (chronic obstructive pulmonary disease) ([place]) 12/23/2015  . Heart failure with preserved ejection fraction ([place]) 12/23/2015  . DDD (degenerative disc disease) 09/25/2013  . Narrowing of intervertebral disc space 09/25/2013  . OSA (obstructive sleep apnea) 07/25/2013  . Deep vein thrombosis (DVT) ([place]) 06/25/2013  . Erythema 06/18/2013  . Left leg pain 06/18/2013  . Mediastinal lymphadenopathy 06/18/2013  . DVT, lower extremity ([place]) 06/14/2013  . Pulmonary hypertension ([place]) 06/14/2013  .  Asthma 09/21/2010  .  Chronic pain 09/21/2010  . Hypertension, benign 09/21/2010  . Hypothyroidism 09/21/2010  . Venous stasis 09/21/2010   PCP:  Jacklynn Barnacle, MD Pharmacy:   [place], [place] - [facility] [address] [place] [address] Phone: [phone] Fax: [place], [place] [address] [place] [address] Phone: [phone] Fax: [phone]   Social Determinants of Health (SDOH) Interventions    Readmission Risk Interventions No flowsheet data found.

## 2019-08-05 NOTE — ED Provider Notes (Signed)
-----------------------------------------   4:27 AM on 08/05/2019 -----------------------------------------  Blood pressure (!) 110/54, pulse 84, temperature 98.9 F (37.2 C), temperature source Oral, resp. rate 20, height 5\' 5"  (1.651 m), weight 95.7 kg, SpO2 95 %.  The patient is calm and cooperative at this time.  There have been no acute events since the last update.  Patient pending further evaluation by social work to assist with placement.   Blake Divine, MD 08/05/19 [phone]

## 2019-08-06 LAB — URINALYSIS, COMPLETE (UACMP) WITH MICROSCOPIC
Bacteria, UA: NONE SEEN
Bilirubin Urine: NEGATIVE
Glucose, UA: NEGATIVE mg/dL
Hgb urine dipstick: NEGATIVE
Ketones, ur: NEGATIVE mg/dL
Nitrite: NEGATIVE
Protein, ur: NEGATIVE mg/dL
Specific Gravity, Urine: 1.014 (ref 1.005–1.030)
pH: 6 (ref 5.0–8.0)

## 2019-08-06 LAB — URINE CULTURE: Special Requests: NORMAL

## 2019-08-06 LAB — SARS CORONAVIRUS 2 BY RT PCR (HOSPITAL ORDER, PERFORMED IN ~~LOC~~ HOSPITAL LAB): SARS Coronavirus 2: NEGATIVE

## 2019-08-06 NOTE — Social Work (Signed)
CSW faxed COVID test results to [facility] Patient should be able to transport tomorrow    [place], [place] ED  [phone]

## 2019-08-06 NOTE — NC FL2 (Signed)
[place] LEVEL OF CARE SCREENING TOOL     IDENTIFICATION  Patient Name: Regina White Birthdate: 05-22-1956 Sex: female Admission Date (Current Location): 08/04/2019  [place] and [place] Number:  Engineering geologist and Address:  [facility], [address], [place], [place] [address]      Provider Number: [id number]  Attending Physician Name and Address:  No att. providers found  Relative Name and Phone Number:  Joesph July, Husband    Current Level of Care: Hospital Recommended Level of Care: [place] Prior Approval Number:    Date Approved/Denied:   PASRR Number: [id number]  Discharge Plan: SNF    Current Diagnoses: Patient Active Problem List   Diagnosis Date Noted  . Hypovolemic shock ([place]) 07/22/2019  . Acute respiratory failure with hypoxemia ([place]) 12/04/2018  . Acute respiratory failure with hypoxia ([place]) 06/02/2018  . Diarrhea of presumed infectious origin 02/17/2016  . Right acute serous otitis media 02/17/2016  . COPD (chronic obstructive pulmonary disease) ([place]) 12/23/2015  . Heart failure with preserved ejection fraction ([place]) 12/23/2015  . DDD (degenerative disc disease) 09/25/2013  . Narrowing of intervertebral disc space 09/25/2013  . OSA (obstructive sleep apnea) 07/25/2013  . Deep vein thrombosis (DVT) ([place]) 06/25/2013  . Erythema 06/18/2013  . Left leg pain 06/18/2013  . Mediastinal lymphadenopathy 06/18/2013  . DVT, lower extremity ([place]) 06/14/2013  . Pulmonary hypertension ([place]) 06/14/2013  . Asthma 09/21/2010  . Chronic pain 09/21/2010  . Hypertension, benign 09/21/2010  . Hypothyroidism 09/21/2010  . Venous stasis 09/21/2010    Orientation RESPIRATION BLADDER Height & Weight     Self, Time, Situation, Place   Continent Weight: 211 lb (95.7 kg) Height:  5' 5"  (165.1 cm)  BEHAVIORAL SYMPTOMS/MOOD NEUROLOGICAL BOWEL NUTRITION STATUS      Continent Diet  AMBULATORY  STATUS COMMUNICATION OF NEEDS Skin   Extensive Assist Verbally Normal                       Personal Care Assistance Level of Assistance  Bathing, Feeding Bathing Assistance: Limited assistance Feeding assistance: Independent Dressing Assistance: Limited assistance     Functional Limitations Info  Sight, Hearing, Speech Sight Info: Adequate Hearing Info: Adequate Speech Info: Adequate    SPECIAL CARE FACTORS FREQUENCY  PT (By licensed PT), OT (By licensed OT)     PT Frequency: 5 times a week OT Frequency: 5 times a week            Contractures Contractures Info: Not present    Additional Factors Info  Code Status Code Status Info: FULL Allergies Info: Penicillins, Erythromycin, Levofloxacin, Lidocaine, Clindamycin, Levothyroxine, Tiotropium, Doxycycline, Sulfa Antibiotics           Current Medications (08/06/2019):  This is the current hospital active medication list Current Facility-Administered Medications  Medication Dose Route Frequency Provider Last Rate Last Dose  . acetaminophen (TYLENOL) tablet 500-1,000 mg  500-1,000 mg Oral Q8H PRN Earleen Newport, MD      . acidophilus (RISAQUAD) capsule 2 capsule  2 capsule Oral TID AC Earleen Newport, MD   2 capsule at 08/06/19 1251  . albuterol (PROVENTIL) (2.5 MG/3ML) 0.083% nebulizer solution 5 mg  5 mg Inhalation Q6H PRN Earleen Newport, MD      . apixaban Arne Cleveland) tablet 2.5 mg  2.5 mg Oral BID Earleen Newport, MD   2.5 mg at 08/06/19 1122  . bumetanide (BUMEX) tablet 1 mg  1 mg  Oral Renae Gloss, MD   1 mg at 08/05/19 1820  . cholecalciferol (VITAMIN D3) tablet 2,000 Units  2,000 Units Oral Daily Earleen Newport, MD   2,000 Units at 08/06/19 1122  . ciprofloxacin (CIPRO) IVPB 400 mg  400 mg Intravenous Q12H Earleen Newport, MD   Stopped at 08/06/19 1225  . famotidine (PEPCID) tablet 20 mg  20 mg Oral BID Earleen Newport, MD   20 mg at 08/06/19 1121  . fluticasone  furoate-vilanterol (BREO ELLIPTA) 200-25 MCG/INH 1 puff  1 puff Inhalation Daily Earleen Newport, MD   1 puff at 08/06/19 1133  . gabapentin (NEURONTIN) capsule 300-600 mg  300-600 mg Oral TID Earleen Newport, MD   300 mg at 08/06/19 1121  . ipratropium-albuterol (DUONEB) 0.5-2.5 (3) MG/3ML nebulizer solution 3 mL  3 mL Inhalation Q6H PRN Earleen Newport, MD      . levothyroxine (SYNTHROID) tablet 200 mcg  200 mcg Oral Daily Earleen Newport, MD   200 mcg at 08/06/19 1131  . liothyronine (CYTOMEL) tablet 25 mcg  25 mcg Oral Daily Earleen Newport, MD      . metoCLOPramide (REGLAN) tablet 10 mg  10 mg Oral Daily PRN Earleen Newport, MD      . midodrine (PROAMATINE) tablet 10 mg  10 mg Oral TID WC Earleen Newport, MD   10 mg at 08/06/19 1251  . montelukast (SINGULAIR) tablet 10 mg  10 mg Oral Daily Earleen Newport, MD   10 mg at 08/06/19 1122  . multivitamin with minerals tablet 1 tablet  1 tablet Oral Daily Earleen Newport, MD   1 tablet at 08/06/19 1122  . naloxone [facility]) nasal spray 4 mg/0.1 mL  1 spray Nasal Once Earleen Newport, MD   Stopped at 08/04/19 2116  . oxyCODONE (Oxy IR/ROXICODONE) immediate release tablet 5 mg  5 mg Oral Q6H PRN Earleen Newport, MD   5 mg at 08/06/19 0820  . polyethylene glycol (MIRALAX / GLYCOLAX) packet 17 g  17 g Oral Daily PRN Earleen Newport, MD       Current Outpatient Medications  Medication Sig Dispense Refill  . acetaminophen (TYLENOL) 500 MG tablet Take 500-1,000 mg by mouth every 8 (eight) hours as needed.    Marland Kitchen acidophilus (RISAQUAD) CAPS capsule Take 2 capsules by mouth 3 (three) times daily before meals. 30 capsule 0  . albuterol (VENTOLIN HFA) 108 (90 Base) MCG/ACT inhaler Inhale 2 puffs into the lungs every 6 (six) hours as needed for wheezing or shortness of breath.    Marland Kitchen apixaban (ELIQUIS) 2.5 MG TABS tablet Take 2.5 mg by mouth 2 (two) times daily.    . famotidine (PEPCID) 20 MG tablet Take  20 mg by mouth 2 (two) times daily.    . Fluticasone-Salmeterol (ADVAIR) 500-50 MCG/DOSE AEPB Inhale 1 puff into the lungs 2 (two) times daily.    Marland Kitchen gabapentin (NEURONTIN) 300 MG capsule Take 300-600 mg by mouth 3 (three) times daily.    Marland Kitchen ipratropium (ATROVENT HFA) 17 MCG/ACT inhaler INHALE TWO PUFFS BY MOUTH FOUR TIMES A DAY    . levothyroxine (SYNTHROID) 200 MCG tablet Take 200 mcg by mouth daily.    Marland Kitchen lidocaine (LIDODERM) 5 % Place 1 patch onto the skin every 12 (twelve) hours.    Marland Kitchen liothyronine (CYTOMEL) 25 MCG tablet Take 25 mcg by mouth daily.    . metoCLOPramide (REGLAN) 10 MG tablet Take 10 mg by  mouth daily as needed.    . midodrine (PROAMATINE) 10 MG tablet Take 1 tablet (10 mg total) by mouth 3 (three) times daily with meals. 90 tablet 0  . montelukast (SINGULAIR) 10 MG tablet Take 10 mg by mouth daily.    . Multiple Vitamin (MULTIVITAMIN WITH MINERALS) TABS tablet Take 1 tablet by mouth daily. 90 tablet 0  . naloxone (NARCAN) 4 MG/0.1ML LIQD nasal spray kit Place 1 spray into the nose once.    Marland Kitchen oxyCODONE (ROXICODONE) 5 MG immediate release tablet Take 1 tablet (5 mg total) by mouth every 6 (six) hours as needed. 15 tablet 0  . polyethylene glycol (MIRALAX / GLYCOLAX) 17 g packet Take 17 g by mouth daily as needed for constipation.    . Vitamin D, Cholecalciferol, 25 MCG (1000 UT) TABS Take 2,000 Units by mouth daily.       Discharge Medications: Please see discharge summary for a list of discharge medications.  Relevant Imaging Results:  Relevant Lab Results:   Additional Information SSN:    [id number]  Tania Amber Guthridge, LCSW

## 2019-08-06 NOTE — ED Notes (Signed)
Pt resting, states her right arm and both legs are hurting. Pt waiting for SNF placement; denies pain in buttocks. Suction canister emptied of 900 ml urine. Pt alert & oriented, nad noted.

## 2019-08-06 NOTE — ED Notes (Signed)
Pt given meal tray.

## 2019-08-06 NOTE — ED Notes (Signed)
Pt alert and watching tv.  Iv med infusing.

## 2019-08-06 NOTE — Social Work (Addendum)
CSW awaiting bed offers for SNF placement.   1110 - CSW presented bed offers to patient. Patient declined the offers.  Patient asked CSW to fax clinicals to four facilities in [place].   CSW awaiting bed offers from these facilities  1215 - CSW calling facilties. [facility], and [facility] do not have beds. [facility] phone line continues to be busy, and awaiting return phone call after leaving voicemail at [facility].   12:33 - No beds at [facility].   1256 - Patient requested CSW call [facility] SNF.  Olivia Mackie at [facility] is looking over insurance information.   [place], [place] ED  [phone]

## 2019-08-06 NOTE — Social Work (Addendum)
[facility] has started insurance authorization.    4:02pm - Patient has Medicare, and has had a 3 night inpatient stay recently.  [facility] will just need COVID test results, and will accept patient.   [place], [place] ED  [phone]

## 2019-08-06 NOTE — ED Notes (Addendum)
Bed linens and pt gown changed, new brief applied. 2 new hydrocolloid dressings applied to bil buttocks. Bruising present to bil buttocks, pressure sores present noted to have red wound beds with scant bloody drainage. Tegaderms placed to abrasions on L flank and R upper buttock. Purewick catheter changed, previous tubing position noted to have caused red mark to pt's R thigh where it was secured tightly by brief. New brief placed does not have sides closed to prevent further redness. Suction tubing wrapped with cloth where it lays over pt's skin. Red mark already improved somewhat in color by end of changing pt's bed. Pt turned to L side, pt aware that she should try to remain on this side for 2 hours before switching sides to prevent further skin breakdown to buttocks/sacrum. Heels floated.

## 2019-08-06 NOTE — Consult Note (Signed)
[place] Nurse wound consult note Patient in [facility] ED 12 awaiting direct admit to SNF.  Reason for Consult: "multiple pressure ulcers".  I have reviewed the patient's record, including the note from Dr. Jerilynn Mages. Paul on 08/05/19 at [facility].  It appears from this review of record that the wound care needs have been met and orders are in the record.  I spoke with the patient's primary RN, Rhona Raider, and have provided my pager number.  She has agreed to reach out if additional [place] support is needed. Val Riles, RN, MSN, CWOCN, CNS-BC, pager [phone] I

## 2019-08-06 NOTE — ED Notes (Signed)
Pt turned to L side

## 2019-08-06 NOTE — ED Provider Notes (Signed)
-----------------------------------------   12:49 PM on 08/06/2019 -----------------------------------------  BP 104/64   Pulse 72   Temp 98.9 F (37.2 C) (Oral)   Resp 18   Ht 5\' 5"  (1.651 m)   Wt 95.7 kg   LMP  (LMP Unknown)   SpO2 98%   BMI 35.11 kg/m   The patient is calm and cooperative at this time.  There have been no acute events since the last update.  Awaiting disposition plan with Social Work team(s) for placement.   Lilia Pro., MD 08/06/19 1250

## 2019-08-06 NOTE — ED Notes (Signed)
Resumed care from [place] rn.

## 2019-08-06 NOTE — ED Notes (Signed)
Pt turned onto R side.

## 2019-08-07 MED ORDER — CIPROFLOXACIN HCL 500 MG PO TABS: 500.0000 mg | ORAL_TABLET | Freq: Two times a day (BID) | ORAL | 0 refills | Status: DC

## 2019-08-07 NOTE — ED Provider Notes (Signed)
Discussed case with the patient, she is comfortable and understanding the plan to go to rehab.  Understands plan to continue a few additional days of ciprofloxacin, for which her previous culture from about a month ago showed E. coli sensitive.  She also understands she will need follow-up with  her PCP.  She is awake alert, eating lunch, all questions answered for both her and her husband and she is comfortable with plan to be discharged to rehab.  Today's Vitals   08/07/19 1148 08/07/19 1157 08/07/19 1257 08/07/19 1310  BP:   119/62   Pulse:   86   Resp:   18   Temp:   97.7 F (36.5 C)   TempSrc:   Oral   SpO2:   94%   Weight:      Height:      PainSc: 6  6   6     Body mass index is 35.11 kg/m.     Delman Kitten, MD 08/07/19 1320

## 2019-08-07 NOTE — ED Notes (Signed)
Pt sleeping. 

## 2019-08-07 NOTE — ED Notes (Signed)
Report off to kim rn  

## 2019-08-07 NOTE — ED Notes (Signed)
Report given to [facility]. 

## 2019-08-07 NOTE — Discharge Instructions (Signed)
Patient has a pending urine culture.  Please feel free to call the emergency department tomorrow to speak with Caesar Chestnut for results of that culture.  [phone]

## 2019-08-07 NOTE — Social Work (Signed)
Patient will be admitted to [facility].   Room: 805 B  Number for report: [phone]  EDP notified  RN notified  ED Secretary notified.    CSW signing off.    [place], [place] ED  [phone]

## 2019-08-07 NOTE — ED Notes (Signed)
[facility]  CALLED  FOR  TRANSPORT 

## 2019-08-07 NOTE — Social Work (Addendum)
CSW waiting on admissions director at [facility] to determine whether patient will be able to transport to the facility.  CSW will notify RN, and EDP once this information is obtained.   9:51am - [facility] is ready to receive this patient. They are waiting for the AVS before they provide CSW with room number and number for report.  EDP notified   10:52am - CSW faxed AVS to [facility]; waiting on room number and number for report.     [place], [place] ED  [phone]

## 2019-08-09 LAB — URINE CULTURE: Culture: 30000 — AB

## 2019-08-10 NOTE — Progress Notes (Signed)
ED Antimicrobial Stewardship Positive Culture Follow Up   Regina White is an 63 y.o. female who presented to [facility] on 08/04/2019 with a chief complaint of  Chief Complaint  Patient presents with  . generalized pain    Recent Results (from the past 720 hour(s))  Blood Culture (routine x 2)     Status: None   Collection Time: 07/22/19  9:40 AM   Specimen: BLOOD  Result Value Ref Range Status   Specimen Description BLOOD BLOOD LEFT HAND  Final   Special Requests   Final    BOTTLES DRAWN AEROBIC AND ANAEROBIC Blood Culture adequate volume   Culture   Final    NO GROWTH 5 DAYS Performed at [facility], [address] [place], [place] [address]    Report Status 07/27/2019 FINAL  Final  Blood Culture (routine x 2)     Status: None   Collection Time: 07/22/19  9:44 AM   Specimen: BLOOD  Result Value Ref Range Status   Specimen Description BLOOD BLOOD LEFT WRIST  Final   Special Requests   Final    BOTTLES DRAWN AEROBIC AND ANAEROBIC Blood Culture adequate volume   Culture   Final    NO GROWTH 5 DAYS Performed at [facility], [address] [place], [place] [address]    Report Status 07/27/2019 FINAL  Final  Urine culture     Status: Abnormal   Collection Time: 07/22/19 10:23 AM   Specimen: In/Out Cath Urine  Result Value Ref Range Status   Specimen Description   Final    IN/OUT CATH URINE Performed at [facility], [address] [place], [place] [address]    Special Requests   Final    NONE Performed at [facility], [address] [place], [place] [address]    Culture >=100,000 COLONIES/mL ESCHERICHIA COLI (A)  Final   Report Status 07/24/2019 FINAL  Final   Organism ID, Bacteria ESCHERICHIA COLI (A)  Final      Susceptibility   Escherichia coli - MIC*    AMPICILLIN >=32 RESISTANT Resistant     CEFAZOLIN <=4 SENSITIVE Sensitive     CEFTRIAXONE <=1 SENSITIVE Sensitive     CIPROFLOXACIN <=0.25 SENSITIVE Sensitive      GENTAMICIN <=1 SENSITIVE Sensitive     IMIPENEM <=0.25 SENSITIVE Sensitive     NITROFURANTOIN <=16 SENSITIVE Sensitive     TRIMETH/SULFA <=20 SENSITIVE Sensitive     AMPICILLIN/SULBACTAM 8 SENSITIVE Sensitive     PIP/TAZO <=4 SENSITIVE Sensitive     Extended ESBL NEGATIVE Sensitive     * >=100,000 COLONIES/mL ESCHERICHIA COLI  SARS Coronavirus 2 [facility](Hospital order, Performed in [facility] hospital lab) Nasopharyngeal Nasopharyngeal Swab     Status: None   Collection Time: 07/22/19 10:23 AM   Specimen: Nasopharyngeal Swab  Result Value Ref Range Status   SARS Coronavirus 2 NEGATIVE NEGATIVE Final    Comment: (NOTE) If result is NEGATIVE SARS-CoV-2 target nucleic acids are NOT DETECTED. The SARS-CoV-2 RNA is generally detectable in upper and lower  respiratory specimens during the acute phase of infection. The lowest  concentration of SARS-CoV-2 viral copies this assay can detect is 250  copies / mL. A negative result does not preclude SARS-CoV-2 infection  and should not be used as the sole basis for treatment or other  patient management decisions.  A negative result may occur with  improper specimen collection / handling, submission of specimen other  than nasopharyngeal swab, presence of  viral mutation(s) within the  areas targeted by this assay, and inadequate number of viral copies  (<250 copies / mL). A negative result must be combined with clinical  observations, patient history, and epidemiological information. If result is POSITIVE SARS-CoV-2 target nucleic acids are DETECTED. The SARS-CoV-2 RNA is generally detectable in upper and lower  respiratory specimens dur ing the acute phase of infection.  Positive  results are indicative of active infection with SARS-CoV-2.  Clinical  correlation with patient history and other diagnostic information is  necessary to determine patient infection status.  Positive results do  not rule out bacterial infection or co-infection with  other viruses. If result is PRESUMPTIVE POSTIVE SARS-CoV-2 nucleic acids MAY BE PRESENT.   A presumptive positive result was obtained on the submitted specimen  and confirmed on repeat testing.  While 2019 novel coronavirus  (SARS-CoV-2) nucleic acids may be present in the submitted sample  additional confirmatory testing may be necessary for epidemiological  and / or clinical management purposes  to differentiate between  SARS-CoV-2 and other Sarbecovirus currently known to infect humans.  If clinically indicated additional testing with an alternate test  methodology [phone]) is advised. The SARS-CoV-2 RNA is generally  detectable in upper and lower respiratory sp ecimens during the acute  phase of infection. The expected result is Negative. Fact Sheet for Patients:  BoilerBrush.com.cy Fact Sheet for Healthcare Providers: https://pope.com/ This test is not yet approved or cleared by the [place] FDA and has been authorized for detection and/or diagnosis of SARS-CoV-2 by FDA under an Emergency Use Authorization (EUA).  This EUA will remain in effect (meaning this test can be used) for the duration of the COVID-19 declaration under Section 564(b)(1) of the Act, 21 U.S.C. section 360bbb-3(b)(1), unless the authorization is terminated or revoked sooner. Performed at [facility], [address] [place], [place] [address]   MRSA PCR Screening     Status: None   Collection Time: 07/22/19 12:57 PM   Specimen: Nasopharyngeal  Result Value Ref Range Status   MRSA by PCR NEGATIVE NEGATIVE Final    Comment:        The GeneXpert MRSA Assay (FDA approved for NASAL specimens only), is one component of a comprehensive MRSA colonization surveillance program. It is not intended to diagnose MRSA infection nor to guide or monitor treatment for MRSA infections. Performed at [facility], [address],  [place], [place] [address]   Urine Culture     Status: Abnormal   Collection Time: 08/04/19  8:26 PM   Specimen: Urine, Clean Catch  Result Value Ref Range Status   Specimen Description   Final    URINE, CLEAN CATCH Performed at [facility], [address] [place], [place] [address]    Special Requests   Final    Normal Performed at [facility], [address] [place], [place] [address]    Culture MULTIPLE SPECIES PRESENT, SUGGEST RECOLLECTION (A)  Final   Report Status 08/06/2019 FINAL  Final  SARS Coronavirus 2 [facility] order, Performed in [facility] hospital lab) Nasopharyngeal Nasopharyngeal Swab     Status: None   Collection Time: 08/06/19  4:30 PM   Specimen: Nasopharyngeal Swab  Result Value Ref Range Status   SARS Coronavirus 2 NEGATIVE NEGATIVE Final    Comment: (NOTE) If result is NEGATIVE SARS-CoV-2 target nucleic acids are NOT DETECTED. The SARS-CoV-2 RNA is generally detectable in upper and lower  respiratory specimens during the acute phase of infection.  The lowest  concentration of SARS-CoV-2 viral copies this assay can detect is 250  copies / mL. A negative result does not preclude SARS-CoV-2 infection  and should not be used as the sole basis for treatment or other  patient management decisions.  A negative result may occur with  improper specimen collection / handling, submission of specimen other  than nasopharyngeal swab, presence of viral mutation(s) within the  areas targeted by this assay, and inadequate number of viral copies  (<250 copies / mL). A negative result must be combined with clinical  observations, patient history, and epidemiological information. If result is POSITIVE SARS-CoV-2 target nucleic acids are DETECTED. The SARS-CoV-2 RNA is generally detectable in upper and lower  respiratory specimens dur ing the acute phase of infection.  Positive  results are indicative of active infection with SARS-CoV-2.  Clinical   correlation with patient history and other diagnostic information is  necessary to determine patient infection status.  Positive results do  not rule out bacterial infection or co-infection with other viruses. If result is PRESUMPTIVE POSTIVE SARS-CoV-2 nucleic acids MAY BE PRESENT.   A presumptive positive result was obtained on the submitted specimen  and confirmed on repeat testing.  While 2019 novel coronavirus  (SARS-CoV-2) nucleic acids may be present in the submitted sample  additional confirmatory testing may be necessary for epidemiological  and / or clinical management purposes  to differentiate between  SARS-CoV-2 and other Sarbecovirus currently known to infect humans.  If clinically indicated additional testing with an alternate test  methodology [phone]) is advised. The SARS-CoV-2 RNA is generally  detectable in upper and lower respiratory sp ecimens during the acute  phase of infection. The expected result is Negative. Fact Sheet for Patients:  BoilerBrush.com.cy Fact Sheet for Healthcare Providers: https://pope.com/ This test is not yet approved or cleared by the [place] FDA and has been authorized for detection and/or diagnosis of SARS-CoV-2 by FDA under an Emergency Use Authorization (EUA).  This EUA will remain in effect (meaning this test can be used) for the duration of the COVID-19 declaration under Section 564(b)(1) of the Act, 21 U.S.C. section 360bbb-3(b)(1), unless the authorization is terminated or revoked sooner. Performed at [facility], [address] [place], [place] [address]   Urine culture     Status: Abnormal   Collection Time: 08/06/19  4:47 PM   Specimen: Urine, Random  Result Value Ref Range Status   Specimen Description   Final    URINE, RANDOM Performed at [facility], [address], [place], [place] [address]    Special Requests   Final    NONE Performed at  [facility], [address] [place], [place] [address]    Culture 30,000 COLONIES/mL ENTEROCOCCUS FAECALIS (A)  Final   Report Status 08/09/2019 FINAL  Final   Organism ID, Bacteria ENTEROCOCCUS FAECALIS (A)  Final      Susceptibility   Enterococcus faecalis - MIC*    AMPICILLIN <=2 SENSITIVE Sensitive     LEVOFLOXACIN >=8 RESISTANT Resistant     NITROFURANTOIN <=16 SENSITIVE Sensitive     VANCOMYCIN 1 SENSITIVE Sensitive     * 30,000 COLONIES/mL ENTEROCOCCUS FAECALIS     Treated with Cipro  bid, organism resistant to prescribed antimicrobial  New antibiotic prescription: None  Pt was being treated for a clinically significant E Coli infection from a urine culture from 9/13 from a hospital admission prior to this ED visit.    The patient was continued  on this cipro during ED stay and discharged to [facility] for rehab with 4 more days of therapy.  Prior to ED discharge, a second urine culture was obtained on 9/26 which was inconclusive, and a third was obtained on 9/28 which grew 30k colonies of fluoroquinolone-resistant Enterococcus.    I discussed these results with Dr Delman Kitten and it was decided that unless patient was still symptomatic this was likely clinically insignificant and should not be treated at this time.    I was instructed to call [facility] and speak to the care team and inform them of the results and the plan and request that they call the ED if the patient became symptomatic.  I spoke with care team member Colletta Maryland and informed her of the plan.  ED Provider: Heywood Bene, PharmD, BCPS Clinical Pharmacist 08/10/2019 10:50 AM

## 2019-09-21 ENCOUNTER — Ambulatory Visit: Payer: Self-pay

## 2019-09-21 ENCOUNTER — Encounter: Payer: Self-pay | Admitting: Orthopaedic Surgery

## 2019-09-21 ENCOUNTER — Ambulatory Visit (INDEPENDENT_AMBULATORY_CARE_PROVIDER_SITE_OTHER): Payer: 59 | Admitting: Orthopaedic Surgery

## 2019-09-21 ENCOUNTER — Other Ambulatory Visit: Payer: Self-pay

## 2019-09-21 VITALS — Ht 65.0 in | Wt 211.0 lb

## 2019-09-21 DIAGNOSIS — M25552 Pain in left hip: Secondary | ICD-10-CM | POA: Diagnosis not present

## 2019-09-21 DIAGNOSIS — G5631 Lesion of radial nerve, right upper limb: Secondary | ICD-10-CM | POA: Diagnosis not present

## 2019-09-21 NOTE — Progress Notes (Signed)
Office Visit Note   Patient: Regina White           Date of Birth: 1956/06/18           MRN: [id number] Visit Date: 09/21/2019              Requested by: Jacklynn Barnacle, MD [address] [facility] [id number] [place],  [place] [address] PCP: Jacklynn Barnacle, MD   Assessment & Plan: Visit Diagnoses:  1. Pain in left hip   2. 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  In regards to the left hip, we will refer her to Dr. Junius Roads for an ultrasound-guided cortisone injection.  At the end of the day she will need a total hip replacement, but due to her multiple comorbidities I do not see this occurring in the near future.  She will follow-up with [place] as needed for her hip.  In regards to the right upper extremity, she has developed a wrist contracture and we will go ahead and refer her to Dr. Fredna Dow for further evaluation and treatment recommendation.  Follow-Up Instructions: Return if symptoms worsen or fail to improve.   Orders:  Orders Placed This Encounter  Procedures  . XR HIP UNILAT W OR W/O PELVIS 2-3 VIEWS LEFT  . XR Lumbar Spine 2-3 Views  . US Guided Needle Placement - No Linked Charges  . Ambulatory referral to Hand Surgery   No orders of the defined types were placed in this encounter.     Procedures: No procedures performed   Clinical Data: No additional findings.   Subjective: Chief Complaint  Patient presents with  . Left Hip - Pain    HPI patient is a pleasant 63 year old female who presents our clinic today with left hip pain.  This began in August following thrombectomy.  She was then placed on Lovenox where she developed a bleed to her right upper extremity which subsequently caused a radial nerve palsy.  She has been in and out of the hospital as well as skilled nursing facility since.  She is currently residing at [facility].  In regards to the left hip, the pain she has is to the anterior thigh and radiates down her entire leg.  Worse with ambulation.  She is ambulating most of the time in a wheelchair at this point.  She has been taking oxycodone without relief of symptoms.  She denies any numbness, tingling or burning.  No previous cortisone injection to the left hip joint.  She does have a history of lumbar surgery back in 1990.  In regards to her right hand, she has been getting OT and was wearing a wrist splint however this was removed due to performing a skin abrasion.  Review of Systems as detailed in HPI.  All others reviewed and are negative.   Objective: Vital Signs: Ht 5\' 5"  (1.651 m)   Wt 211 lb (95.7 kg)   LMP  (LMP Unknown)   BMI 35.11 kg/m   Physical Exam well-developed and well-nourished female in no acute distress.  Alert and oriented x3.  Ortho Exam examination of her left hip: Limited exam secondary to pain with any movement of the left leg.  Examination of the right hand shows numbness throughout the hand and all 5 fingers.  She is unable to extend the wrist or thumb.  She does have pain with  passive wrist extension.  Specialty Comments:  No specialty comments available.  Imaging: US Guided Needle Placement - No Linked Charges  Result Date: 09/21/2019 Please see Notes tab for imaging impression.    PMFS History: Patient Active Problem List   Diagnosis Date Noted  . Hypovolemic shock (HCC) 07/22/2019  . Acute respiratory failure with hypoxemia (HCC) 12/04/2018  . Acute respiratory failure with hypoxia (HCC) 06/02/2018  . Diarrhea of presumed infectious origin 02/17/2016  . Right acute serous otitis media 02/17/2016  . COPD (chronic obstructive pulmonary disease) (HCC) 12/23/2015  . Heart failure with preserved ejection fraction (HCC) 12/23/2015  . DDD (degenerative disc disease) 09/25/2013  . Narrowing of intervertebral disc space 09/25/2013  . OSA (obstructive sleep  apnea) 07/25/2013  . Deep vein thrombosis (DVT) (HCC) 06/25/2013  . Erythema 06/18/2013  . Left leg pain 06/18/2013  . Mediastinal lymphadenopathy 06/18/2013  . DVT, lower extremity (HCC) 06/14/2013  . Pulmonary hypertension (HCC) 06/14/2013  . Asthma 09/21/2010  . Chronic pain 09/21/2010  . Hypertension, benign 09/21/2010  . Hypothyroidism 09/21/2010  . Venous stasis 09/21/2010   Past Medical History:  Diagnosis Date  . Asthma   . CHF (congestive heart failure) (HCC)   . Chronic back pain   . COPD (chronic obstructive pulmonary disease) (HCC)   . Pulmonary hypertension (HCC)     Family History  Problem Relation Age of Onset  . Hypertension Mother   . Liver cancer Father     Past Surgical History:  Procedure Laterality Date  . BACK SURGERY     Social History   Occupational History  . Not on file  Tobacco Use  . Smoking status: Former Smoker    Packs/day: 1.00    Years: 30.00    Pack years: 30.00    Types: Cigarettes    Quit date: 06/09/1999    Years since quitting: 20.2  . Smokeless tobacco: Never Used  Substance and Sexual Activity  . Alcohol use: No  . Drug use: Never  . Sexual activity: Not Currently

## 2019-09-21 NOTE — Progress Notes (Signed)
Subjective: Patient is here for ultrasound-guided intra-articular left hip injection.   End-stage DJD with severe groin pain.  Objective: Very painful with any movement of her left hip.  Procedure: Ultrasound-guided left hip injection: After sterile prep with Betadine, injected 8 cc 1% lidocaine without epinephrine and 40 mg methylprednisolone using a 22-gauge spinal needle, passing the needle through the iliofemoral ligament into the femoral head/neck junction.  Injectate was seen filling the joint capsule.  She had good immediate relief.  Follow-up as directed.

## 2019-10-02 ENCOUNTER — Telehealth: Payer: Self-pay | Admitting: Orthopaedic Surgery

## 2019-10-02 NOTE — Telephone Encounter (Signed)
Spoke to her.

## 2019-10-02 NOTE — Telephone Encounter (Signed)
Patient called left voicemail message that the pain is much worse in her left hip. Patient asked for a call back as soon as possible.  The number to contact patient is [phone]

## 2019-10-02 NOTE — Telephone Encounter (Signed)
See message.

## 2019-10-19 ENCOUNTER — Emergency Department (HOSPITAL_COMMUNITY): Payer: Medicare Other

## 2019-10-19 ENCOUNTER — Inpatient Hospital Stay (HOSPITAL_COMMUNITY)
Admission: EM | Admit: 2019-10-19 | Discharge: 2019-10-25 | DRG: 871 | Disposition: A | Discharge status: Home Health | Payer: Medicare Other | Source: Skilled Nursing Facility | Attending: Family Medicine | Admitting: Family Medicine

## 2019-10-19 ENCOUNTER — Encounter (HOSPITAL_COMMUNITY): Payer: Self-pay | Admitting: Internal Medicine

## 2019-10-19 ENCOUNTER — Other Ambulatory Visit: Payer: Self-pay

## 2019-10-19 DIAGNOSIS — R6520 Severe sepsis without septic shock: Secondary | ICD-10-CM | POA: Diagnosis present

## 2019-10-19 DIAGNOSIS — G8929 Other chronic pain: Secondary | ICD-10-CM | POA: Diagnosis present

## 2019-10-19 DIAGNOSIS — J129 Viral pneumonia, unspecified: Secondary | ICD-10-CM | POA: Diagnosis not present

## 2019-10-19 DIAGNOSIS — I272 Pulmonary hypertension, unspecified: Secondary | ICD-10-CM | POA: Diagnosis present

## 2019-10-19 DIAGNOSIS — I10 Essential (primary) hypertension: Secondary | ICD-10-CM | POA: Diagnosis present

## 2019-10-19 DIAGNOSIS — K219 Gastro-esophageal reflux disease without esophagitis: Secondary | ICD-10-CM | POA: Diagnosis present

## 2019-10-19 DIAGNOSIS — U071 COVID-19: Secondary | ICD-10-CM | POA: Diagnosis present

## 2019-10-19 DIAGNOSIS — Z888 Allergy status to other drugs, medicaments and biological substances status: Secondary | ICD-10-CM

## 2019-10-19 DIAGNOSIS — Z7901 Long term (current) use of anticoagulants: Secondary | ICD-10-CM

## 2019-10-19 DIAGNOSIS — M549 Dorsalgia, unspecified: Secondary | ICD-10-CM | POA: Diagnosis present

## 2019-10-19 DIAGNOSIS — J440 Chronic obstructive pulmonary disease with acute lower respiratory infection: Secondary | ICD-10-CM | POA: Diagnosis present

## 2019-10-19 DIAGNOSIS — Z79891 Long term (current) use of opiate analgesic: Secondary | ICD-10-CM

## 2019-10-19 DIAGNOSIS — Z881 Allergy status to other antibiotic agents status: Secondary | ICD-10-CM

## 2019-10-19 DIAGNOSIS — G4733 Obstructive sleep apnea (adult) (pediatric): Secondary | ICD-10-CM | POA: Diagnosis present

## 2019-10-19 DIAGNOSIS — J1289 Other viral pneumonia: Secondary | ICD-10-CM | POA: Diagnosis present

## 2019-10-19 DIAGNOSIS — E039 Hypothyroidism, unspecified: Secondary | ICD-10-CM | POA: Diagnosis present

## 2019-10-19 DIAGNOSIS — Z86718 Personal history of other venous thrombosis and embolism: Secondary | ICD-10-CM

## 2019-10-19 DIAGNOSIS — Z882 Allergy status to sulfonamides status: Secondary | ICD-10-CM

## 2019-10-19 DIAGNOSIS — Z87891 Personal history of nicotine dependence: Secondary | ICD-10-CM

## 2019-10-19 DIAGNOSIS — Z880 Allergy status to penicillin: Secondary | ICD-10-CM

## 2019-10-19 DIAGNOSIS — Z79890 Hormone replacement therapy (postmenopausal): Secondary | ICD-10-CM

## 2019-10-19 DIAGNOSIS — Z800 Family history of malignant neoplasm of digestive organs: Secondary | ICD-10-CM

## 2019-10-19 DIAGNOSIS — N179 Acute kidney failure, unspecified: Secondary | ICD-10-CM | POA: Diagnosis present

## 2019-10-19 DIAGNOSIS — Z79899 Other long term (current) drug therapy: Secondary | ICD-10-CM

## 2019-10-19 DIAGNOSIS — I440 Atrioventricular block, first degree: Secondary | ICD-10-CM | POA: Diagnosis present

## 2019-10-19 DIAGNOSIS — E871 Hypo-osmolality and hyponatremia: Secondary | ICD-10-CM | POA: Diagnosis not present

## 2019-10-19 DIAGNOSIS — A4189 Other specified sepsis: Secondary | ICD-10-CM | POA: Diagnosis not present

## 2019-10-19 DIAGNOSIS — G5631 Lesion of radial nerve, right upper limb: Secondary | ICD-10-CM | POA: Diagnosis present

## 2019-10-19 DIAGNOSIS — Z6835 Body mass index (BMI) 35.0-35.9, adult: Secondary | ICD-10-CM

## 2019-10-19 DIAGNOSIS — L89152 Pressure ulcer of sacral region, stage 2: Secondary | ICD-10-CM | POA: Diagnosis present

## 2019-10-19 DIAGNOSIS — Z8249 Family history of ischemic heart disease and other diseases of the circulatory system: Secondary | ICD-10-CM

## 2019-10-19 DIAGNOSIS — I5022 Chronic systolic (congestive) heart failure: Secondary | ICD-10-CM | POA: Diagnosis present

## 2019-10-19 DIAGNOSIS — E669 Obesity, unspecified: Secondary | ICD-10-CM | POA: Diagnosis present

## 2019-10-19 DIAGNOSIS — J9601 Acute respiratory failure with hypoxia: Secondary | ICD-10-CM

## 2019-10-19 DIAGNOSIS — L8990 Pressure ulcer of unspecified site, unspecified stage: Secondary | ICD-10-CM | POA: Insufficient documentation

## 2019-10-19 DIAGNOSIS — I951 Orthostatic hypotension: Secondary | ICD-10-CM | POA: Diagnosis present

## 2019-10-19 DIAGNOSIS — M160 Bilateral primary osteoarthritis of hip: Secondary | ICD-10-CM | POA: Diagnosis present

## 2019-10-19 DIAGNOSIS — I472 Ventricular tachycardia: Secondary | ICD-10-CM | POA: Diagnosis not present

## 2019-10-19 DIAGNOSIS — M24531 Contracture, right wrist: Secondary | ICD-10-CM | POA: Diagnosis present

## 2019-10-19 DIAGNOSIS — Z20828 Contact with and (suspected) exposure to other viral communicable diseases: Secondary | ICD-10-CM | POA: Diagnosis present

## 2019-10-19 DIAGNOSIS — I110 Hypertensive heart disease with heart failure: Secondary | ICD-10-CM | POA: Diagnosis present

## 2019-10-19 LAB — CBC WITH DIFFERENTIAL/PLATELET
Abs Immature Granulocytes: 0.02 10*3/uL (ref 0.00–0.07)
Basophils Absolute: 0 10*3/uL (ref 0.0–0.1)
Basophils Relative: 0 %
Eosinophils Absolute: 0 10*3/uL (ref 0.0–0.5)
Eosinophils Relative: 0 %
HCT: 49.4 % — ABNORMAL HIGH (ref 36.0–46.0)
Hemoglobin: 15.3 g/dL — ABNORMAL HIGH (ref 12.0–15.0)
Immature Granulocytes: 0 %
Lymphocytes Relative: 12 %
Lymphs Abs: 0.8 10*3/uL (ref 0.7–4.0)
MCH: 25.7 pg — ABNORMAL LOW (ref 26.0–34.0)
MCHC: 31 g/dL (ref 30.0–36.0)
MCV: 83 fL (ref 80.0–100.0)
Monocytes Absolute: 0.5 10*3/uL (ref 0.1–1.0)
Monocytes Relative: 8 %
Neutro Abs: 5.2 10*3/uL (ref 1.7–7.7)
Neutrophils Relative %: 80 %
Platelets: 186 10*3/uL (ref 150–400)
RBC: 5.95 MIL/uL — ABNORMAL HIGH (ref 3.87–5.11)
RDW: 17.6 % — ABNORMAL HIGH (ref 11.5–15.5)
WBC: 6.5 10*3/uL (ref 4.0–10.5)
nRBC: 0 % (ref 0.0–0.2)

## 2019-10-19 LAB — COMPREHENSIVE METABOLIC PANEL
ALT: 23 U/L (ref 0–44)
AST: 26 U/L (ref 15–41)
Albumin: 3.4 g/dL — ABNORMAL LOW (ref 3.5–5.0)
Alkaline Phosphatase: 64 U/L (ref 38–126)
Anion gap: 11 (ref 5–15)
BUN: 14 mg/dL (ref 8–23)
CO2: 20 mmol/L — ABNORMAL LOW (ref 22–32)
Calcium: 8.4 mg/dL — ABNORMAL LOW (ref 8.9–10.3)
Chloride: 102 mmol/L (ref 98–111)
Creatinine, Ser: 0.79 mg/dL (ref 0.44–1.00)
GFR calc Af Amer: >60 mL/min (ref >60)
GFR calc non Af Amer: >60 mL/min (ref >60)
Glucose, Bld: 96 mg/dL (ref 70–99)
Potassium: 4.3 mmol/L (ref 3.5–5.1)
Sodium: 133 mmol/L — ABNORMAL LOW (ref 135–145)
Total Bilirubin: 1.1 mg/dL (ref 0.3–1.2)
Total Protein: 6.1 g/dL — ABNORMAL LOW (ref 6.5–8.1)

## 2019-10-19 LAB — TRIGLYCERIDES: Triglycerides: 113 mg/dL (ref <150)

## 2019-10-19 LAB — LACTATE DEHYDROGENASE: LDH: 272 U/L — ABNORMAL HIGH (ref 98–192)

## 2019-10-19 LAB — PROCALCITONIN: Procalcitonin: 0.11 ng/mL

## 2019-10-19 LAB — ABO/RH: ABO/RH(D): AB POS

## 2019-10-19 LAB — C-REACTIVE PROTEIN: CRP: 8.3 mg/dL — ABNORMAL HIGH (ref <1.0)

## 2019-10-19 LAB — LACTIC ACID, PLASMA: Lactic Acid, Venous: 1.2 mmol/L (ref 0.5–1.9)

## 2019-10-19 LAB — FIBRINOGEN: Fibrinogen: 497 mg/dL — ABNORMAL HIGH (ref 210–475)

## 2019-10-19 LAB — FERRITIN: Ferritin: 101 ng/mL (ref 11–307)

## 2019-10-19 LAB — D-DIMER, QUANTITATIVE: D-Dimer, Quant: 0.83 ug/mL-FEU — ABNORMAL HIGH (ref 0.00–0.50)

## 2019-10-19 LAB — POC SARS CORONAVIRUS 2 AG -  ED: SARS Coronavirus 2 Ag: POSITIVE — AB

## 2019-10-19 MED ORDER — PRO-STAT SUGAR FREE PO LIQD
30.0000 mL | Freq: Every evening | ORAL | Status: DC
  Administered 2019-10-20 – 2019-10-25 (×6): 30 mL via ORAL
  Filled 2019-10-19 (×5): qty 30

## 2019-10-19 MED ORDER — LEVOTHYROXINE SODIUM 75 MCG PO TABS: 200.0000 ug | ORAL_TABLET | Freq: Every day | ORAL | Status: DC

## 2019-10-19 MED ORDER — MONTELUKAST SODIUM 10 MG PO TABS
10.0000 mg | ORAL_TABLET | Freq: Every day | ORAL | Status: DC
  Administered 2019-10-20 – 2019-10-25 (×6): 10 mg via ORAL
  Filled 2019-10-19 (×7): qty 1

## 2019-10-19 MED ORDER — POLYETHYLENE GLYCOL 3350 17 G PO PACK: 17.0000 g | PACK | Freq: Every day | ORAL | Status: DC | PRN

## 2019-10-19 MED ORDER — DECUBI-VITE PO CAPS: 1.0000 | ORAL_CAPSULE | Freq: Every evening | ORAL | Status: DC

## 2019-10-19 MED ORDER — CYCLOBENZAPRINE HCL 5 MG PO TABS: 5.0000 mg | ORAL_TABLET | ORAL | Status: DC

## 2019-10-19 MED ORDER — RISAQUAD PO CAPS
2.0000 | ORAL_CAPSULE | Freq: Three times a day (TID) | ORAL | Status: DC
  Administered 2019-10-20 – 2019-10-25 (×17): 2 via ORAL
  Filled 2019-10-19 (×21): qty 2

## 2019-10-19 MED ORDER — OXYCODONE HCL 5 MG PO TABS
5.0000 mg | ORAL_TABLET | Freq: Four times a day (QID) | ORAL | Status: DC | PRN
  Administered 2019-10-19 – 2019-10-20 (×2): 5 mg via ORAL
  Filled 2019-10-19 (×2): qty 1

## 2019-10-19 MED ORDER — SODIUM CHLORIDE 0.9 % IV SOLN
100.0000 mg | Freq: Every day | INTRAVENOUS | Status: AC
  Administered 2019-10-20 – 2019-10-23 (×4): 100 mg via INTRAVENOUS
  Filled 2019-10-19: qty 100
  Filled 2019-10-19 (×4): qty 20

## 2019-10-19 MED ORDER — GABAPENTIN 300 MG PO CAPS
300.0000 mg | ORAL_CAPSULE | Freq: Three times a day (TID) | ORAL | Status: DC
  Administered 2019-10-20: 01:00:00 300 mg via ORAL
  Filled 2019-10-19: qty 1

## 2019-10-19 MED ORDER — ENOXAPARIN SODIUM 40 MG/0.4ML ~~LOC~~ SOLN: 40.0000 mg | SUBCUTANEOUS | Status: DC

## 2019-10-19 MED ORDER — SODIUM CHLORIDE 0.9% FLUSH: 3.0000 mL | INTRAVENOUS | Status: DC | PRN

## 2019-10-19 MED ORDER — NUTRITIONAL SUPPLEMENT PO LIQD: Freq: Two times a day (BID) | ORAL | Status: DC

## 2019-10-19 MED ORDER — FAMOTIDINE 20 MG PO TABS
20.0000 mg | ORAL_TABLET | Freq: Two times a day (BID) | ORAL | Status: DC
  Administered 2019-10-20 – 2019-10-25 (×12): 20 mg via ORAL
  Filled 2019-10-19 (×11): qty 1

## 2019-10-19 MED ORDER — SODIUM CHLORIDE 0.9 % IV SOLN: 250.0000 mL | INTRAVENOUS | Status: DC | PRN

## 2019-10-19 MED ORDER — SODIUM CHLORIDE 0.9% FLUSH
3.0000 mL | Freq: Two times a day (BID) | INTRAVENOUS | Status: DC
  Administered 2019-10-19 – 2019-10-23 (×9): 3 mL via INTRAVENOUS
  Administered 2019-10-24: 10 mL via INTRAVENOUS
  Administered 2019-10-24 – 2019-10-25 (×2): 3 mL via INTRAVENOUS

## 2019-10-19 MED ORDER — MOMETASONE FURO-FORMOTEROL FUM 200-5 MCG/ACT IN AERO
2.0000 | INHALATION_SPRAY | Freq: Two times a day (BID) | RESPIRATORY_TRACT | Status: DC
  Administered 2019-10-20 – 2019-10-25 (×12): 2 via RESPIRATORY_TRACT
  Filled 2019-10-19 (×2): qty 8.8

## 2019-10-19 MED ORDER — ADULT MULTIVITAMIN W/MINERALS CH
1.0000 | ORAL_TABLET | Freq: Every day | ORAL | Status: DC
  Administered 2019-10-20 – 2019-10-25 (×6): 1 via ORAL
  Filled 2019-10-19 (×6): qty 1

## 2019-10-19 MED ORDER — SODIUM CHLORIDE 0.9 % IV SOLN
200.0000 mg | Freq: Once | INTRAVENOUS | Status: AC
  Administered 2019-10-19: 200 mg via INTRAVENOUS
  Filled 2019-10-19: qty 40

## 2019-10-19 MED ORDER — APIXABAN 2.5 MG PO TABS
2.5000 mg | ORAL_TABLET | Freq: Two times a day (BID) | ORAL | Status: DC
  Administered 2019-10-19 – 2019-10-25 (×12): 2.5 mg via ORAL
  Filled 2019-10-19 (×15): qty 1

## 2019-10-19 MED ORDER — METOCLOPRAMIDE HCL 10 MG PO TABS
10.0000 mg | ORAL_TABLET | Freq: Every day | ORAL | Status: DC | PRN
  Filled 2019-10-19: qty 1

## 2019-10-19 MED ORDER — LIOTHYRONINE SODIUM 25 MCG PO TABS: 25.0000 ug | ORAL_TABLET | Freq: Every day | ORAL | Status: DC

## 2019-10-19 MED ORDER — ALBUTEROL SULFATE HFA 108 (90 BASE) MCG/ACT IN AERS
2.0000 | INHALATION_SPRAY | Freq: Four times a day (QID) | RESPIRATORY_TRACT | Status: DC | PRN
  Filled 2019-10-19: qty 6.7

## 2019-10-19 MED ORDER — VITAMIN D 25 MCG (1000 UNIT) PO TABS
2000.0000 [IU] | ORAL_TABLET | Freq: Every day | ORAL | Status: DC
  Administered 2019-10-20 – 2019-10-25 (×6): 2000 [IU] via ORAL
  Filled 2019-10-19 (×6): qty 2

## 2019-10-19 MED ORDER — DEXAMETHASONE SODIUM PHOSPHATE 10 MG/ML IJ SOLN
6.0000 mg | INTRAMUSCULAR | Status: DC
  Administered 2019-10-19: 6 mg via INTRAVENOUS
  Filled 2019-10-19: qty 1

## 2019-10-19 MED ORDER — IPRATROPIUM BROMIDE HFA 17 MCG/ACT IN AERS
2.0000 | INHALATION_SPRAY | Freq: Four times a day (QID) | RESPIRATORY_TRACT | Status: DC
  Administered 2019-10-20 – 2019-10-25 (×22): 2 via RESPIRATORY_TRACT
  Filled 2019-10-19 (×2): qty 12.9

## 2019-10-19 MED ORDER — MIDODRINE HCL 5 MG PO TABS
10.0000 mg | ORAL_TABLET | Freq: Three times a day (TID) | ORAL | Status: DC
  Administered 2019-10-20: 10 mg via ORAL
  Filled 2019-10-19 (×3): qty 2

## 2019-10-19 NOTE — H&P (Addendum)
TRH H&P    Patient Demographics:    Regina White, is a 63 y.o. female  MRN: [id number]  DOB - [birth year]  Admit Date - 10/19/2019  Referring MD/NP/PA: Madalyn Rob  Outpatient Primary MD for the patient is Jacklynn Barnacle, MD   Patient coming from:  [place]  Chief complaint-   dyspnea   HPI:    Regina White  is a 63 y.o. 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 Diff 12/13/2018, h/o intramuscular hematoma R triceps muscle  , covid -19 positive 6 days ago per patient, presents with dyspnea x 4 days. Slight cough with white / clear sputum. Slight loose stool.  Pt denies alteration in sense of taste or smell. Pt denies fever, chills, cp, palp, n/v, abd pain, brbpr, black stool. Pt was brought for evaluation of dyspnea.   In ED,  T 99.6, P 86  R 32 Bp 116/69  Pox 95% on 3L [place]   Wt 95.7kg  CXR IMPRESSION: 1. Patchy heterogeneous airspace disease bilaterally, consistent with multifocal infection and in keeping with reported diagnosis of COVID-19. 2. Cardiomegaly.  Wbc 6.5, hgb 15.3, Plt 186 Na 133, K 4.3, Bun 14, Creatinine 0.79 Alb 3.4 Ast 26, Alt 23 D dimer 0.83 LDH 272 Lactic acid 1.2 Ferritin 101 Crp 8.3  Pt will be admitted for covid-19 infection.         Review of systems:    In addition to the HPI above,  No Fever-chills, No Headache, No changes with Vision or hearing, No problems swallowing food or Liquids, No Chest pain,   No Abdominal pain, No Nausea or Vomiting, No Blood in stool or Urine, No dysuria, No new skin rashes or bruises, No new joints pains-aches,  No new weakness, tingling, numbness in any extremity, No recent weight gain or loss, No polyuria, polydypsia or polyphagia, No significant Mental Stressors.  All other systems reviewed and are negative.    Past History of the following  :    Past Medical History:  Diagnosis Date  . Asthma   . CHF (congestive heart failure) ([place])   . Chronic back pain   . COPD (chronic obstructive pulmonary disease) ([place])   . Pulmonary hypertension ([place])       Past Surgical History:  Procedure Laterality Date  . BACK SURGERY        Social History:      Social History   Tobacco Use  . Smoking status: Former Smoker    Packs/day: 1.00    Years: 30.00    Pack years: 30.00    Types: Cigarettes    Quit date: 06/09/1999    Years since quitting: 20.3  . Smokeless tobacco: Never Used  Substance Use Topics  . Alcohol use: No       Family History :     Family History  Problem Relation Age of Onset  . Hypertension Mother   . Liver cancer Father        Home Medications:   Prior  to Admission medications   Medication Sig Start Date End Date Taking? Authorizing Provider  acetaminophen (TYLENOL) 500 MG tablet Take 500-1,000 mg by mouth every 8 (eight) hours as needed. 11/24/18   [provider]  acidophilus (RISAQUAD) CAPS capsule Take 2 capsules by mouth 3 (three) times daily before meals. 08/02/19   Gouru, Illene Silver, MD  albuterol (VENTOLIN HFA) 108 (90 Base) MCG/ACT inhaler Inhale 2 puffs into the lungs every 6 (six) hours as needed for wheezing or shortness of breath.    [provider]  apixaban (ELIQUIS) 2.5 MG TABS tablet Take 2.5 mg by mouth 2 (two) times daily. 07/18/19   [provider]  ciprofloxacin (CIPRO) 500 MG tablet Take 1 tablet (500 mg total) by mouth 2 (two) times daily. 08/07/19   Delman Kitten, MD  famotidine (PEPCID) 20 MG tablet Take 20 mg by mouth 2 (two) times daily. 06/19/18   [provider]  Fluticasone-Salmeterol (ADVAIR) 500-50 MCG/DOSE AEPB Inhale 1 puff into the lungs 2 (two) times daily. 10/11/18   [provider]  gabapentin (NEURONTIN) 300 MG capsule Take 300-600 mg by mouth 3 (three) times daily. 07/18/19   [provider]  ipratropium (ATROVENT HFA)  17 MCG/ACT inhaler INHALE TWO PUFFS BY MOUTH FOUR TIMES A DAY 07/17/18   [provider]  levothyroxine (SYNTHROID) 200 MCG tablet Take 200 mcg by mouth daily. 11/17/18   [provider]  liothyronine (CYTOMEL) 25 MCG tablet Take 25 mcg by mouth daily.    [provider]  metoCLOPramide (REGLAN) 10 MG tablet Take 10 mg by mouth daily as needed. 09/19/18   [provider]  midodrine (PROAMATINE) 10 MG tablet Take 1 tablet (10 mg total) by mouth 3 (three) times daily with meals. 08/02/19   Gouru, Illene Silver, MD  montelukast (SINGULAIR) 10 MG tablet Take 10 mg by mouth daily.    [provider]  Multiple Vitamin (MULTIVITAMIN WITH MINERALS) TABS tablet Take 1 tablet by mouth daily. 08/03/19   Nicholes Mango, MD  naloxone [facility]) 4 MG/0.1ML LIQD nasal spray kit Place 1 spray into the nose once.    [provider]  oxyCODONE (ROXICODONE) 5 MG immediate release tablet Take 1 tablet (5 mg total) by mouth every 6 (six) hours as needed. 08/02/19 08/01/20  Nicholes Mango, MD  Vitamin D, Cholecalciferol, 25 MCG (1000 UT) TABS Take 2,000 Units by mouth daily.    [provider]     Allergies:     Allergies  Allergen Reactions  . Penicillins Anaphylaxis    BREATHING PROBLEMS  Patient tolerated Ceftriaxone September 2020  . Erythromycin Nausea And Vomiting and Rash    STOMACH PAIN  . Levofloxacin     Other reaction(s): Other MUSCLE ACHES  . Lidocaine Rash  . Clindamycin     Recurrent C diff  . Levothyroxine Other (See Comments)    Vaginal bleeding Patient used BRAND ONLY SYNTHROID  . Tiotropium Other (See Comments)    Coughing up blood  . Doxycycline Nausea And Vomiting  . Sulfa Antibiotics Rash     Physical Exam:   Vitals  Blood pressure 127/71, pulse (!) 32, temperature 99.6 F (37.6 C), temperature source Oral, resp. rate 20, height 5' 5" (1.651 m), weight 95.7 kg, SpO2 91 %.  1.  General: axoxo3  2. Psychiatric: euthymic  3.  Neurologic: cn2-12 intact, reflexes 2+ symmetric, diffuse, no clonus, motor 5/5 in all 4 ext  4. HEENMT:  Anicteric, pupils 1.3m symmetric,direct, consensual, intact Neck: no jvd  5. Respiratory : Slight crackles left lung base, right lung base, no wheezing  6. Cardiovascular : rrr s1, s2,   7. Gastrointestinal:  Abd: soft, nt, nd, +bs  8. Skin:  Ext: no c/c/e, no rash  9.Musculoskeletal:  Good ROM    Data Review:    CBC Recent Labs  Lab 10/19/19 1945  WBC 6.5  HGB 15.3*  HCT 49.4*  PLT 186  MCV 83.0  MCH 25.7*  MCHC 31.0  RDW 17.6*  LYMPHSABS 0.8  MONOABS 0.5  EOSABS 0.0  BASOSABS 0.0   ------------------------------------------------------------------------------------------------------------------  Results for orders placed or performed during the hospital encounter of 10/19/19 (from the past 48 hour(s))  Lactic acid, plasma     Status: None   Collection Time: 10/19/19  7:45 PM  Result Value Ref Range   Lactic Acid, Venous 1.2 0.5 - 1.9 mmol/L    Comment: Performed at [facility] [address], [place], [place] [address]  CBC WITH DIFFERENTIAL     Status: Abnormal   Collection Time: 10/19/19  7:45 PM  Result Value Ref Range   WBC 6.5 4.0 - 10.5 K/uL   RBC 5.95 (H) 3.87 - 5.11 MIL/uL   Hemoglobin 15.3 (H) 12.0 - 15.0 g/dL   HCT 49.4 (H) 36.0 - 46.0 %   MCV 83.0 80.0 - 100.0 fL   MCH 25.7 (L) 26.0 - 34.0 pg   MCHC 31.0 30.0 - 36.0 g/dL   RDW 17.6 (H) 11.5 - 15.5 %   Platelets 186 150 - 400 K/uL   nRBC 0.0 0.0 - 0.2 %   Neutrophils Relative % 80 %   Neutro Abs 5.2 1.7 - 7.7 K/uL   Lymphocytes Relative 12 %   Lymphs Abs 0.8 0.7 - 4.0 K/uL   Monocytes Relative 8 %   Monocytes Absolute 0.5 0.1 - 1.0 K/uL   Eosinophils Relative 0 %   Eosinophils Absolute 0.0 0.0 - 0.5 K/uL   Basophils Relative 0 %   Basophils Absolute 0.0 0.0 - 0.1 K/uL   Immature Granulocytes 0 %   Abs Immature Granulocytes 0.02 0.00 - 0.07 K/uL    Comment: Performed  at [facility], [place] [address], [place], [place] [address]  Comprehensive metabolic panel     Status: Abnormal   Collection Time: 10/19/19  7:45 PM  Result Value Ref Range   Sodium 133 (L) 135 - 145 mmol/L   Potassium 4.3 3.5 - 5.1 mmol/L   Chloride 102 98 - 111 mmol/L   CO2 20 (L) 22 - 32 mmol/L   Glucose, Bld 96 70 - 99 mg/dL   BUN 14 8 - 23 mg/dL   Creatinine, Ser 0.79 0.44 - 1.00 mg/dL   Calcium 8.4 (L) 8.9 - 10.3 mg/dL   Total Protein 6.1 (L) 6.5 - 8.1 g/dL   Albumin 3.4 (L) 3.5 - 5.0 g/dL   AST 26 15 - 41 U/L   ALT 23 0 - 44 U/L   Alkaline Phosphatase 64 38 - 126 U/L   Total Bilirubin 1.1 0.3 - 1.2 mg/dL   GFR calc non Af Amer >60 >60 mL/min   GFR calc Af Amer >60 >60 mL/min   Anion gap 11 5 - 15    Comment: Performed at [facility], [place] [address] [place], [place] [address]  D-dimer, quantitative     Status: Abnormal   Collection Time: 10/19/19  7:45 PM  Result Value Ref Range   D-Dimer, Quant 0.83 (  H) 0.00 - 0.50 ug/mL-FEU    Comment: (NOTE) At the manufacturer cut-off of 0.50 ug/mL FEU, this assay has been documented to exclude PE with a sensitivity and negative predictive value of 97 to 99%.  At this time, this assay has not been approved by the FDA to exclude DVT/VTE. Results should be correlated with clinical presentation. Performed at [facility], [place] [address], [place], [place] [address]   Procalcitonin     Status: None   Collection Time: 10/19/19  7:45 PM  Result Value Ref Range   Procalcitonin 0.11 ng/mL    Comment:        Interpretation: PCT (Procalcitonin) <= 0.5 ng/mL: Systemic infection (sepsis) is not likely. Local bacterial infection is possible. (NOTE)       Sepsis PCT Algorithm           Lower Respiratory Tract                                      Infection PCT Algorithm    ----------------------------     ----------------------------         PCT < 0.25 ng/mL                PCT < 0.10 ng/mL         Strongly encourage              Strongly discourage   discontinuation of antibiotics    initiation of antibiotics    ----------------------------     -----------------------------       PCT 0.25 - 0.50 ng/mL            PCT 0.10 - 0.25 ng/mL               OR       >80% decrease in PCT            Discourage initiation of                                            antibiotics      Encourage discontinuation           of antibiotics    ----------------------------     -----------------------------         PCT >= 0.50 ng/mL              PCT 0.26 - 0.50 ng/mL               AND        <80% decrease in PCT             Encourage initiation of                                             antibiotics       Encourage continuation           of antibiotics    ----------------------------     -----------------------------        PCT >= 0.50 ng/mL                  PCT > 0.50 ng/mL  AND         increase in PCT                  Strongly encourage                                      initiation of antibiotics    Strongly encourage escalation           of antibiotics                                     -----------------------------                                           PCT <= 0.25 ng/mL                                                 OR                                        > 80% decrease in PCT                                     Discontinue / Do not initiate                                             antibiotics Performed at [facility], [address], [place], [place] [address]   Lactate dehydrogenase     Status: Abnormal   Collection Time: 10/19/19  7:45 PM  Result Value Ref Range   LDH 272 (H) 98 - 192 U/L    Comment: Performed at [facility], [place] [address] [place], [place] [address]  Ferritin     Status: None   Collection Time: 10/19/19  7:45 PM  Result Value Ref Range   Ferritin 101 11 - 307 ng/mL    Comment: Performed at [facility], [place] [address] [place], [place]  [address]  Fibrinogen     Status: Abnormal   Collection Time: 10/19/19  7:45 PM  Result Value Ref Range   Fibrinogen 497 (H) 210 - 475 mg/dL    Comment: Performed at [facility] [address] [place], [place] [address]  C-reactive protein     Status: Abnormal   Collection Time: 10/19/19  7:45 PM  Result Value Ref Range   CRP 8.3 (H) <1.0 mg/dL    Comment: Performed at [facility] [address], [place], [place] [address]  Triglycerides     Status: None   Collection Time: 10/19/19  7:45 PM  Result Value Ref Range   Triglycerides 113 <150 mg/dL    Comment: Performed at [facility] [address], [place], [place] [address]  Chemistries  Recent Labs  Lab 10/19/19 1945  NA 133*  K 4.3  CL 102  CO2 20*  GLUCOSE 96  BUN 14  CREATININE 0.79  CALCIUM 8.4*  AST 26  ALT 23  ALKPHOS 64  BILITOT 1.1   ------------------------------------------------------------------------------------------------------------------  ------------------------------------------------------------------------------------------------------------------ GFR: Estimated Creatinine Clearance: 81.3 mL/min (by C-G formula based on SCr of 0.79 mg/dL). Liver Function Tests: Recent Labs  Lab 10/19/19 1945  AST 26  ALT 23  ALKPHOS 64  BILITOT 1.1  PROT 6.1*  ALBUMIN 3.4*   No results for input(s): LIPASE, AMYLASE in the last 168 hours. No results for input(s): AMMONIA in the last 168 hours. Coagulation Profile: No results for input(s): INR, PROTIME in the last 168 hours. Cardiac Enzymes: No results for input(s): CKTOTAL, CKMB, CKMBINDEX, TROPONINI in the last 168 hours. BNP (last 3 results) No results for input(s): PROBNP in the last 8760 hours. HbA1C: No results for input(s): HGBA1C in the last 72 hours. CBG: No results for input(s): GLUCAP in the last 168 hours. Lipid Profile: Recent Labs    10/19/19 1945  TRIG 113   Thyroid Function Tests: No results for input(s):  TSH, T4TOTAL, FREET4, T3FREE, THYROIDAB in the last 72 hours. Anemia Panel: Recent Labs    10/19/19 1945  FERRITIN 101    --------------------------------------------------------------------------------------------------------------- Urine analysis:    Component Value Date/Time   COLORURINE YELLOW (A) 08/06/2019 1647   APPEARANCEUR HAZY (A) 08/06/2019 1647   LABSPEC 1.014 08/06/2019 1647   PHURINE 6.0 08/06/2019 1647   GLUCOSEU NEGATIVE 08/06/2019 1647   HGBUR NEGATIVE 08/06/2019 1647   BILIRUBINUR NEGATIVE 08/06/2019 1647   KETONESUR NEGATIVE 08/06/2019 1647   PROTEINUR NEGATIVE 08/06/2019 1647   NITRITE NEGATIVE 08/06/2019 1647   LEUKOCYTESUR MODERATE (A) 08/06/2019 1647      Imaging Results:    DG Chest Port 1 View  Result Date: 10/19/2019 CLINICAL DATA:  COVID, pneumonia EXAM: PORTABLE CHEST 1 VIEW COMPARISON:  08/05/2019 FINDINGS: Cardiomegaly. There is patchy heterogeneous airspace disease bilaterally. The visualized skeletal structures are unremarkable. IMPRESSION: 1. Patchy heterogeneous airspace disease bilaterally, consistent with multifocal infection and in keeping with reported diagnosis of COVID-19. 2. Cardiomegaly. Electronically Signed   By: Eddie Candle M.D.   On: 10/19/2019 19:31    Ekg nsr at 37, nl int,  RBBB, Q v1, v2   Assessment & Plan:    Principal Problem:   COVID-19 virus infection Active Problems:   Hypertension, benign   Hypothyroidism   Pulmonary hypertension (HCC)   Hyponatremia  Dyspnea secondary to Covid-19  Covid-19 infection Check BD rapid test Dexamethasone 21m iv qday Remdesivir consult  Hyponatremia (mild) Check cmp in am Consider further w/up with serum osm, cortisol, tsh, urine sodium, urine osm  Asthma/Copd Cont Albuterol HFA 2puff q6h  Cont Atrovent HFA 2puff q6h  Advair-> Dulera 2puff bid Cont Singulair 172mpo qday  H/o Recurrent  DVT Cont Eliquis pharmacy to dose  Hypothyroidism Cont Levothyroxine 200  micrograms po qday   Nausea Cont Reglan  Orthostasis  Cont Proamatine 1017mo tid  Gerd Cont Pepcid   DVT Prophylaxis-   Eliquis  AM Labs Ordered, also please review Full Orders  Family Communication: Admission, patients condition and plan of care including tests being ordered have been discussed with the patient  who indicate understanding and agree with the plan and Code Status.  Code Status:  FULL CODE per patient, [facility]  Admission status: Observation: Based on  patients clinical presentation and evaluation of above clinical data, I have made determination that patient meets observation criteria at this time. Depending upon response to iv steroids and remdesivir, might require inpatient stay as there is a high possibly of clinical decline with her condition  Time spent in minutes : 70 minutes   Jani Gravel M.D on 10/19/2019 at 9:11 PM

## 2019-10-19 NOTE — ED Provider Notes (Signed)
[facility] EMERGENCY DEPARTMENT Provider Note   CSN: [id number] Arrival date & time: 10/19/19  1710     History No chief complaint on file.   Regina White is a 63 y.o. female.  Presents to emergency department with chief complaint of shortness of breath.  She has been having symptoms for approximately 1 week, tested positive for Covid 6 days ago.  Initially noted cough, congestion, headache and body aches.  Over the past few days had noted steadily increasing shortness of breath.  Cough is nonproductive, nonbloody.  Headache is dull, achy, mild.  No fevers but has had chills and fatigue.  Does not wear oxygen on a chronic basis.  Does have past medical history COPD, heart failure, pulmonary hypertension per chart review.  HPI     Past Medical History:  Diagnosis Date  . Asthma   . CHF (congestive heart failure) (HCC)   . Chronic back pain   . COPD (chronic obstructive pulmonary disease) (HCC)   . Pulmonary hypertension [facility])     Patient Active Problem List   Diagnosis Date Noted  . COVID-19 virus infection 10/19/2019  . Hyponatremia 10/19/2019  . Hypovolemic shock (HCC) 07/22/2019  . Acute respiratory failure with hypoxemia (HCC) 12/04/2018  . Acute respiratory failure with hypoxia (HCC) 06/02/2018  . Diarrhea of presumed infectious origin 02/17/2016  . Right acute serous otitis media 02/17/2016  . COPD (chronic obstructive pulmonary disease) (HCC) 12/23/2015  . Heart failure with preserved ejection fraction (HCC) 12/23/2015  . DDD (degenerative disc disease) 09/25/2013  . Narrowing of intervertebral disc space 09/25/2013  . OSA (obstructive sleep apnea) 07/25/2013  . Deep vein thrombosis (DVT) (HCC) 06/25/2013  . Erythema 06/18/2013  . Left leg pain 06/18/2013  . Mediastinal lymphadenopathy 06/18/2013  . DVT, lower extremity (HCC) 06/14/2013  . Pulmonary hypertension (HCC) 06/14/2013  . Asthma 09/21/2010  . Chronic pain 09/21/2010  .  Hypertension, benign 09/21/2010  . Hypothyroidism 09/21/2010  . Venous stasis 09/21/2010    Past Surgical History:  Procedure Laterality Date  . BACK SURGERY       OB History   No obstetric history on file.     Family History  Problem Relation Age of Onset  . Hypertension Mother   . Liver cancer Father     Social History   Tobacco Use  . Smoking status: Former Smoker    Packs/day: 1.00    Years: 30.00    Pack years: 30.00    Types: Cigarettes    Quit date: 06/09/1999    Years since quitting: 20.3  . Smokeless tobacco: Never Used  Substance Use Topics  . Alcohol use: No  . Drug use: Never    Home Medications Prior to Admission medications   Medication Sig Start Date End Date Taking? Authorizing Provider  acetaminophen (TYLENOL) 325 MG tablet Take 650 mg by mouth every 4 (four) hours as needed (temp >99.5).   Yes [provider]  acidophilus (RISAQUAD) CAPS capsule Take 2 capsules by mouth 3 (three) times daily before meals. 08/02/19  Yes Gouru, Deanna Artis, MD  albuterol (VENTOLIN HFA) 108 (90 Base) MCG/ACT inhaler Inhale 2 puffs into the lungs every 6 (six) hours as needed for wheezing or shortness of breath.   Yes [provider]  Amino Acids-Protein Hydrolys (FEEDING SUPPLEMENT, PRO-STAT SUGAR FREE 64,) LIQD Take 30 mLs by mouth every evening.   Yes [provider]  apixaban (ELIQUIS) 2.5 MG TABS tablet Take 2.5 mg by mouth 2 (two)  times daily. 07/18/19  Yes [provider]  cyclobenzaprine (FLEXERIL) 5 MG tablet Take 5 mg by mouth See admin instructions. Take one tablet (5 mg) by mouth daily at bedtime, may also take one tablet (5 mg) daily as needed for muscle spasms   Yes [provider]  dexamethasone (DECADRON) 6 MG tablet Take 6 mg by mouth every evening.   Yes [provider]  famotidine (PEPCID) 20 MG tablet Take 20 mg by mouth 2 (two) times daily. 06/19/18  Yes [provider]  Fluticasone-Salmeterol  (ADVAIR) 500-50 MCG/DOSE AEPB Inhale 1 puff into the lungs 2 (two) times daily. 10/11/18  Yes [provider]  gabapentin (NEURONTIN) 300 MG capsule Take 300 mg by mouth 3 (three) times daily.  07/18/19  Yes [provider]  ipratropium (ATROVENT HFA) 17 MCG/ACT inhaler Inhale 2 puffs into the lungs 4 (four) times daily.  07/17/18  Yes [provider]  levothyroxine (SYNTHROID) 200 MCG tablet Take 200 mcg by mouth daily at 6 (six) AM.  11/17/18  Yes [provider]  metoCLOPramide (REGLAN) 10 MG tablet Take 10 mg by mouth daily as needed for nausea or vomiting.  09/19/18  Yes [provider]  midodrine (PROAMATINE) 10 MG tablet Take 1 tablet (10 mg total) by mouth 3 (three) times daily with meals. 08/02/19  Yes Gouru, Aruna, MD  montelukast (SINGULAIR) 10 MG tablet Take 10 mg by mouth at bedtime.    Yes [provider]  Multiple Vitamin (MULTIVITAMIN WITH MINERALS) TABS tablet Take 1 tablet by mouth daily. 08/03/19  Yes Gouru, Deanna ArtisAruna, MD  Multiple Vitamins-Minerals (DECUBI-VITE) CAPS Take 1 capsule by mouth every evening.   Yes [provider]  Nutritional Supplements (NUTRITIONAL SUPPLEMENT PO) Take 90 mLs by mouth 2 (two) times daily.   Yes [provider]  oxyCODONE (ROXICODONE) 5 MG immediate release tablet Take 1 tablet (5 mg total) by mouth every 6 (six) hours as needed. Patient taking differently: Take 5 mg by mouth See admin instructions. Take one tablet (5 mg) by mouth every 6 hours, scheduled; may also take one tablet (5 mg) every 6 hours as needed for pain 08/02/19 08/01/20 Yes Gouru, Aruna, MD  OXYGEN Inhale 2 L into the lungs continuous.   Yes [provider]  polyethylene glycol (MIRALAX / GLYCOLAX) 17 g packet Take 17 g by mouth daily as needed (constipation).   Yes [provider]  Vitamin D, Cholecalciferol, 25 MCG (1000 UT) TABS Take 2,000 Units by mouth daily.   Yes [provider]     Allergies    Penicillins, Erythromycin, Levofloxacin, Lidocaine, Clindamycin, Levothyroxine, Tiotropium, Doxycycline, and Sulfa antibiotics  Review of Systems   Review of Systems  Constitutional: Positive for chills. Negative for fever.  HENT: Negative for ear pain and sore throat.   Eyes: Negative for pain and visual disturbance.  Respiratory: Positive for cough and shortness of breath.   Cardiovascular: Negative for chest pain and palpitations.  Gastrointestinal: Negative for abdominal pain and vomiting.  Genitourinary: Negative for dysuria and hematuria.  Musculoskeletal: Positive for back pain and myalgias. Negative for arthralgias.  Skin: Negative for color change and rash.  Neurological: Positive for headaches. Negative for seizures and syncope.  All other systems reviewed and are negative.   Physical Exam Updated Vital Signs BP 127/86   Pulse (!) 51   Temp 99.6 F (37.6 C) (Oral)   Resp 20   Ht 5\' 5"  (1.651 m)   Wt 95.7 kg  LMP  (LMP Unknown)   SpO2 (!) 89%   BMI 35.11 kg/m   Physical Exam Vitals and nursing note reviewed.  Constitutional:      General: She is not in acute distress.    Appearance: She is well-developed.  HENT:     Head: Normocephalic and atraumatic.  Eyes:     Conjunctiva/sclera: Conjunctivae normal.  Cardiovascular:     Rate and Rhythm: Regular rhythm. Tachycardia present.     Heart sounds: No murmur.  Pulmonary:     Comments: B/l coarse breath sounds, no wheeze, mild tachypnea, mild increased WOB Abdominal:     Palpations: Abdomen is soft.     Tenderness: There is no abdominal tenderness.  Musculoskeletal:        General: No swelling or tenderness.     Cervical back: Neck supple.  Skin:    General: Skin is warm and dry.  Neurological:     General: No focal deficit present.     Mental Status: She is alert.  Psychiatric:        Mood and Affect: Mood normal.        Behavior: Behavior normal.     ED Results / Procedures /  Treatments   Labs (all labs ordered are listed, but only abnormal results are displayed) Labs Reviewed  CBC WITH DIFFERENTIAL/PLATELET - Abnormal; Notable for the following components:      Result Value   RBC 5.95 (*)    Hemoglobin 15.3 (*)    HCT 49.4 (*)    MCH 25.7 (*)    RDW 17.6 (*)    All other components within normal limits  COMPREHENSIVE METABOLIC PANEL - Abnormal; Notable for the following components:   Sodium 133 (*)    CO2 20 (*)    Calcium 8.4 (*)    Total Protein 6.1 (*)    Albumin 3.4 (*)    All other components within normal limits  D-DIMER, QUANTITATIVE (NOT AT [facility]) - Abnormal; Notable for the following components:   D-Dimer, Quant 0.83 (*)    All other components within normal limits  LACTATE DEHYDROGENASE - Abnormal; Notable for the following components:   LDH 272 (*)    All other components within normal limits  FIBRINOGEN - Abnormal; Notable for the following components:   Fibrinogen 497 (*)    All other components within normal limits  C-REACTIVE PROTEIN - Abnormal; Notable for the following components:   CRP 8.3 (*)    All other components within normal limits  POC SARS CORONAVIRUS 2 AG -  ED - Abnormal; Notable for the following components:   SARS Coronavirus 2 Ag POSITIVE (*)    All other components within normal limits  CULTURE, BLOOD (ROUTINE X 2)  CULTURE, BLOOD (ROUTINE X 2)  LACTIC ACID, PLASMA  PROCALCITONIN  FERRITIN  TRIGLYCERIDES  LACTIC ACID, PLASMA  CBC WITH DIFFERENTIAL/PLATELET  COMPREHENSIVE METABOLIC PANEL  ABO/RH    EKG EKG Interpretation  Date/Time:  Friday October 19 2019 17:18:29 EST Ventricular Rate:  85 PR Interval:    QRS Duration: 158 QT Interval:  416 QTC Calculation: 495 R Axis:   77 Text Interpretation: Sinus rhythm Multiform ventricular premature complexes Right bundle branch block Anteroseptal infarct, age indeterminate Lateral leads are also involved no acute STEMI Confirmed by Madalyn Rob [phone]) on  10/19/2019 11:31:17 PM   Radiology DG Chest Port 1 View  Result Date: 10/19/2019 CLINICAL DATA:  COVID, pneumonia EXAM: PORTABLE CHEST 1 VIEW COMPARISON:  08/05/2019 FINDINGS: Cardiomegaly. There  is patchy heterogeneous airspace disease bilaterally. The visualized skeletal structures are unremarkable. IMPRESSION: 1. Patchy heterogeneous airspace disease bilaterally, consistent with multifocal infection and in keeping with reported diagnosis of COVID-19. 2. Cardiomegaly. Electronically Signed   By: Lauralyn Primes M.D.   On: 10/19/2019 19:31    Procedures .Critical Care Performed by: Milagros Loll, MD Authorized by: Milagros Loll, MD   Critical care provider statement:    Critical care time (minutes):  45   Critical care was necessary to treat or prevent imminent or life-threatening deterioration of the following conditions:  Respiratory failure   Critical care was time spent personally by me on the following activities:  Discussions with consultants, evaluation of patient's response to treatment, examination of patient, ordering and performing treatments and interventions, ordering and review of laboratory studies, ordering and review of radiographic studies, pulse oximetry, re-evaluation of patient's condition, obtaining history from patient or surrogate and review of old charts   (including critical care time)  Medications Ordered in ED Medications  dexamethasone (DECADRON) injection 6 mg (6 mg Intravenous Given 10/19/19 2119)  sodium chloride flush (NS) 0.9 % injection 3 mL (3 mLs Intravenous Given 10/19/19 2120)  sodium chloride flush (NS) 0.9 % injection 3 mL (has no administration in time range)  0.9 %  sodium chloride infusion (has no administration in time range)  oxyCODONE (Oxy IR/ROXICODONE) immediate release tablet 5 mg (5 mg Oral Given 10/19/19 2313)  midodrine (PROAMATINE) tablet 10 mg (has no administration in time range)  levothyroxine (SYNTHROID) tablet 200 mcg  (has no administration in time range)  acidophilus (RISAQUAD) capsule 2 capsule (has no administration in time range)  famotidine (PEPCID) tablet 20 mg (has no administration in time range)  metoCLOPramide (REGLAN) tablet 10 mg (has no administration in time range)  gabapentin (NEURONTIN) capsule 300-600 mg (has no administration in time range)  multivitamin with minerals tablet 1 tablet (has no administration in time range)  Vitamin D (Cholecalciferol) TABS 2,000 Units (has no administration in time range)  albuterol (VENTOLIN HFA) 108 (90 Base) MCG/ACT inhaler 2 puff (has no administration in time range)  mometasone-formoterol (DULERA) 200-5 MCG/ACT inhaler 2 puff (has no administration in time range)  ipratropium (ATROVENT HFA) inhaler 2 puff (has no administration in time range)  montelukast (SINGULAIR) tablet 10 mg (has no administration in time range)  remdesivir 200 mg in sodium chloride 0.9% 250 mL IVPB (0 mg Intravenous Stopped 10/19/19 2311)    Followed by  remdesivir 100 mg in sodium chloride 0.9 % 100 mL IVPB (has no administration in time range)  apixaban (ELIQUIS) tablet 2.5 mg (2.5 mg Oral Given 10/19/19 2241)  polyethylene glycol (MIRALAX / GLYCOLAX) packet 17 g (has no administration in time range)  cyclobenzaprine (FLEXERIL) tablet 5 mg (has no administration in time range)  feeding supplement (PRO-STAT SUGAR FREE 64) liquid 30 mL (has no administration in time range)  Decubi-Vite CAPS 1 capsule (has no administration in time range)  Nutritional Supplement LIQD (has no administration in time range)    ED Course  I have reviewed the triage vital signs and the nursing notes.  Pertinent labs & imaging results that were available during my care of the patient were reviewed by me and considered in my medical decision making (see chart for details).  Clinical Course as of Oct 19 2335  Fri Oct 19, 2019  1804 Complete initial assessment, not in distress but noted mild  tachypnea, mild increased work of breathing, hypoxic to 88% on  room air, placed on 3 L with improvement in oxygen saturation   [RD]    Clinical Course User Index [RD] Milagros Loll, MD   MDM Rules/Calculators/A&P                        63 year old lady presented to the ER with worsening shortness of breath in setting of recent COVID-19 diagnosis.  Chest x-ray concerning for Covid pneumonia.  Patient not in respiratory failure but did have hypoxia on room air, doing well on 3 L nasal cannula.  Believe patient would benefit from admission for further management.  Started on IV Decadron.  Consulted hospitalist, Dr. Selena Batten will admit.  Final Clinical Impression(s) / ED Diagnoses Final diagnoses:  COVID-19  Pneumonia, viral  Acute respiratory failure with hypoxia [facility])    Rx / DC Orders ED Discharge Orders    None       Milagros Loll, MD 10/19/19 2336

## 2019-10-19 NOTE — ED Triage Notes (Signed)
BIB EMS from [facility] for worsening shortness of breath over the past 4 days. Tested positive for covid 6 days ago. Pt is A&O x4. Afebrile.

## 2019-10-19 NOTE — ED Notes (Signed)
Pt 88% on room air, placed on 4L nasal cannula. sats came up to 93%

## 2019-10-19 NOTE — Progress Notes (Signed)
ANTICOAGULATION CONSULT NOTE - Initial Consult  Pharmacy Consult for Apixaban Indication: Hx of DVT  Allergies  Allergen Reactions  . Penicillins Anaphylaxis    BREATHING PROBLEMS  Patient tolerated Ceftriaxone September 2020  . Erythromycin Nausea And Vomiting and Rash    STOMACH PAIN  . Levofloxacin     Other reaction(s): Other MUSCLE ACHES  . Lidocaine Rash  . Clindamycin     Recurrent C diff  . Levothyroxine Other (See Comments)    Vaginal bleeding Patient used BRAND ONLY SYNTHROID  . Tiotropium Other (See Comments)    Coughing up blood  . Doxycycline Nausea And Vomiting  . Sulfa Antibiotics Rash    Patient Measurements: Height: 5\' 5"  (165.1 cm) Weight: 210 lb 15.7 oz (95.7 kg) IBW/kg (Calculated) : 57 Heparin Dosing Weight: 95.7 kg  Vital Signs: Temp: 99.6 F (37.6 C) (12/11 1718) Temp Source: Oral (12/11 1718) BP: 139/85 (12/11 2115) Pulse Rate: 104 (12/11 2115)  Labs: Recent Labs    10/19/19 1945  HGB 15.3*  HCT 49.4*  PLT 186  CREATININE 0.79    Estimated Creatinine Clearance: 81.3 mL/min (by C-G formula based on SCr of 0.79 mg/dL).   Medical History: Past Medical History:  Diagnosis Date  . Asthma   . CHF (congestive heart failure) ([place])   . Chronic back pain   . COPD (chronic obstructive pulmonary disease) ([place])   . Pulmonary hypertension (HCC)     Medications:  Scheduled:  . apixaban  2.5 mg Oral BID  . dexamethasone (DECADRON) injection  6 mg Intravenous Q24H  . sodium chloride flush  3 mL Intravenous Q12H    Assessment: Patient is a 4 yof that presented to the ED with SOB and was positive for COVID ~ 6 days ago. I spoke with the patient and she she has been on Apixaban for a long period of time and takes 2.5 mg PO BID.   Goal of Therapy:  Monitor platelets by anticoagulation protocol: Yes   Plan:  - Will continue current home regimen for Apixaban as patient does not currently have a new clot and D-Dimer is 0.83  - continue  home Apixaban 2.5 mg PO BID - Pharmacy will sign off at this time for Apixaban dosing and is still following for Remdesivir dosing.   Thank you for including pharmacy in this patient's care.  Duanne Limerick PharmD. BCPS  10/19/2019,9:30 PM

## 2019-10-19 NOTE — ED Notes (Signed)
Regina White [phone] son in law looking for an update on patient

## 2019-10-19 NOTE — ED Notes (Signed)
Informed MD Dykstra of pos results

## 2019-10-20 ENCOUNTER — Encounter (HOSPITAL_COMMUNITY): Payer: Self-pay | Admitting: Internal Medicine

## 2019-10-20 DIAGNOSIS — U071 COVID-19: Secondary | ICD-10-CM

## 2019-10-20 DIAGNOSIS — K219 Gastro-esophageal reflux disease without esophagitis: Secondary | ICD-10-CM | POA: Diagnosis present

## 2019-10-20 DIAGNOSIS — A4189 Other specified sepsis: Secondary | ICD-10-CM | POA: Diagnosis present

## 2019-10-20 DIAGNOSIS — I110 Hypertensive heart disease with heart failure: Secondary | ICD-10-CM | POA: Diagnosis present

## 2019-10-20 DIAGNOSIS — J9601 Acute respiratory failure with hypoxia: Secondary | ICD-10-CM | POA: Diagnosis present

## 2019-10-20 DIAGNOSIS — J1289 Other viral pneumonia: Secondary | ICD-10-CM | POA: Diagnosis present

## 2019-10-20 DIAGNOSIS — I272 Pulmonary hypertension, unspecified: Secondary | ICD-10-CM | POA: Diagnosis present

## 2019-10-20 DIAGNOSIS — Z881 Allergy status to other antibiotic agents status: Secondary | ICD-10-CM | POA: Diagnosis not present

## 2019-10-20 DIAGNOSIS — R110 Nausea: Secondary | ICD-10-CM | POA: Diagnosis not present

## 2019-10-20 DIAGNOSIS — I5022 Chronic systolic (congestive) heart failure: Secondary | ICD-10-CM | POA: Diagnosis present

## 2019-10-20 DIAGNOSIS — J129 Viral pneumonia, unspecified: Secondary | ICD-10-CM | POA: Diagnosis present

## 2019-10-20 DIAGNOSIS — I472 Ventricular tachycardia: Secondary | ICD-10-CM | POA: Diagnosis not present

## 2019-10-20 DIAGNOSIS — I10 Essential (primary) hypertension: Secondary | ICD-10-CM | POA: Diagnosis not present

## 2019-10-20 DIAGNOSIS — Z20828 Contact with and (suspected) exposure to other viral communicable diseases: Secondary | ICD-10-CM | POA: Diagnosis present

## 2019-10-20 DIAGNOSIS — L89152 Pressure ulcer of sacral region, stage 2: Secondary | ICD-10-CM | POA: Diagnosis present

## 2019-10-20 DIAGNOSIS — M160 Bilateral primary osteoarthritis of hip: Secondary | ICD-10-CM | POA: Diagnosis present

## 2019-10-20 DIAGNOSIS — E039 Hypothyroidism, unspecified: Secondary | ICD-10-CM | POA: Diagnosis present

## 2019-10-20 DIAGNOSIS — G4733 Obstructive sleep apnea (adult) (pediatric): Secondary | ICD-10-CM | POA: Diagnosis present

## 2019-10-20 DIAGNOSIS — E669 Obesity, unspecified: Secondary | ICD-10-CM | POA: Diagnosis present

## 2019-10-20 DIAGNOSIS — R6520 Severe sepsis without septic shock: Secondary | ICD-10-CM | POA: Diagnosis present

## 2019-10-20 DIAGNOSIS — E871 Hypo-osmolality and hyponatremia: Secondary | ICD-10-CM

## 2019-10-20 DIAGNOSIS — I951 Orthostatic hypotension: Secondary | ICD-10-CM | POA: Diagnosis present

## 2019-10-20 DIAGNOSIS — Z6835 Body mass index (BMI) 35.0-35.9, adult: Secondary | ICD-10-CM | POA: Diagnosis not present

## 2019-10-20 DIAGNOSIS — J440 Chronic obstructive pulmonary disease with acute lower respiratory infection: Secondary | ICD-10-CM | POA: Diagnosis present

## 2019-10-20 DIAGNOSIS — G5631 Lesion of radial nerve, right upper limb: Secondary | ICD-10-CM | POA: Diagnosis present

## 2019-10-20 DIAGNOSIS — I440 Atrioventricular block, first degree: Secondary | ICD-10-CM | POA: Diagnosis present

## 2019-10-20 DIAGNOSIS — N179 Acute kidney failure, unspecified: Secondary | ICD-10-CM | POA: Diagnosis present

## 2019-10-20 LAB — COMPREHENSIVE METABOLIC PANEL
ALT: 22 U/L (ref 0–44)
AST: 23 U/L (ref 15–41)
Albumin: 3.1 g/dL — ABNORMAL LOW (ref 3.5–5.0)
Alkaline Phosphatase: 61 U/L (ref 38–126)
Anion gap: 11 (ref 5–15)
BUN: 16 mg/dL (ref 8–23)
CO2: 25 mmol/L (ref 22–32)
Calcium: 8.6 mg/dL — ABNORMAL LOW (ref 8.9–10.3)
Chloride: 103 mmol/L (ref 98–111)
Creatinine, Ser: 1.07 mg/dL — ABNORMAL HIGH (ref 0.44–1.00)
GFR calc Af Amer: >60 mL/min (ref >60)
GFR calc non Af Amer: 55 mL/min — ABNORMAL LOW (ref >60)
Glucose, Bld: 162 mg/dL — ABNORMAL HIGH (ref 70–99)
Potassium: 4.6 mmol/L (ref 3.5–5.1)
Sodium: 139 mmol/L (ref 135–145)
Total Bilirubin: 0.9 mg/dL (ref 0.3–1.2)
Total Protein: 5.8 g/dL — ABNORMAL LOW (ref 6.5–8.1)

## 2019-10-20 LAB — CBC WITH DIFFERENTIAL/PLATELET
Abs Immature Granulocytes: 0.01 10*3/uL (ref 0.00–0.07)
Basophils Absolute: 0 10*3/uL (ref 0.0–0.1)
Basophils Relative: 0 %
Eosinophils Absolute: 0 10*3/uL (ref 0.0–0.5)
Eosinophils Relative: 0 %
HCT: 47.5 % — ABNORMAL HIGH (ref 36.0–46.0)
Hemoglobin: 14.8 g/dL (ref 12.0–15.0)
Immature Granulocytes: 0 %
Lymphocytes Relative: 7 %
Lymphs Abs: 0.3 10*3/uL — ABNORMAL LOW (ref 0.7–4.0)
MCH: 26.2 pg (ref 26.0–34.0)
MCHC: 31.2 g/dL (ref 30.0–36.0)
MCV: 84.1 fL (ref 80.0–100.0)
Monocytes Absolute: 0.1 10*3/uL (ref 0.1–1.0)
Monocytes Relative: 3 %
Neutro Abs: 3.3 10*3/uL (ref 1.7–7.7)
Neutrophils Relative %: 90 %
Platelets: 172 10*3/uL (ref 150–400)
RBC: 5.65 MIL/uL — ABNORMAL HIGH (ref 3.87–5.11)
RDW: 17.2 % — ABNORMAL HIGH (ref 11.5–15.5)
WBC: 3.6 10*3/uL — ABNORMAL LOW (ref 4.0–10.5)
nRBC: 0 % (ref 0.0–0.2)

## 2019-10-20 LAB — CBG MONITORING, ED: Glucose-Capillary: 148 mg/dL — ABNORMAL HIGH (ref 70–99)

## 2019-10-20 MED ORDER — CYCLOBENZAPRINE HCL 5 MG PO TABS
5.0000 mg | ORAL_TABLET | Freq: Every day | ORAL | Status: DC | PRN
  Filled 2019-10-20: qty 1

## 2019-10-20 MED ORDER — LEVOTHYROXINE SODIUM 75 MCG PO TABS
200.0000 ug | ORAL_TABLET | Freq: Every day | ORAL | Status: DC
  Administered 2019-10-20 – 2019-10-25 (×6): 200 ug via ORAL
  Filled 2019-10-20 (×7): qty 2

## 2019-10-20 MED ORDER — DEXAMETHASONE SODIUM PHOSPHATE 10 MG/ML IJ SOLN
6.0000 mg | Freq: Two times a day (BID) | INTRAMUSCULAR | Status: DC
  Administered 2019-10-20 – 2019-10-22 (×5): 6 mg via INTRAVENOUS
  Filled 2019-10-20 (×5): qty 1

## 2019-10-20 MED ORDER — CYCLOBENZAPRINE HCL 5 MG PO TABS
5.0000 mg | ORAL_TABLET | Freq: Every day | ORAL | Status: DC
  Administered 2019-10-20 – 2019-10-24 (×5): 5 mg via ORAL
  Filled 2019-10-20 (×7): qty 1

## 2019-10-20 MED ORDER — SODIUM CHLORIDE 0.9% IV SOLUTION
Freq: Once | INTRAVENOUS | Status: AC
  Administered 2019-10-20: 12:00:00 via INTRAVENOUS

## 2019-10-20 MED ORDER — GABAPENTIN 300 MG PO CAPS
300.0000 mg | ORAL_CAPSULE | Freq: Three times a day (TID) | ORAL | Status: DC
  Administered 2019-10-20 – 2019-10-25 (×16): 300 mg via ORAL
  Filled 2019-10-20 (×16): qty 1

## 2019-10-20 MED ORDER — MIDODRINE HCL 5 MG PO TABS
10.0000 mg | ORAL_TABLET | Freq: Three times a day (TID) | ORAL | Status: DC
  Administered 2019-10-20 – 2019-10-25 (×15): 10 mg via ORAL
  Filled 2019-10-20 (×21): qty 2

## 2019-10-20 NOTE — ED Notes (Signed)
Heart healthy breakfast tray 

## 2019-10-20 NOTE — Discharge Instructions (Signed)
° °  Information on my medicine - ELIQUIS® (apixaban) ° °This medication education was reviewed with me or my healthcare representative as part of my discharge preparation.  The pharmacist that spoke with me during my hospital stay was:  Annalysa Mohammad, RPH-CPP ° °Why was Eliquis® prescribed for you? °Eliquis® was prescribed for you to reduce the risk of forming blood clots that can cause a stroke if you have a medical condition called atrial fibrillation (a type of irregular heartbeat) OR to reduce the risk of a blood clots forming after orthopedic surgery. ° °What do You need to know about Eliquis® ? °Take your Eliquis® TWICE DAILY - one tablet in the morning and one tablet in the evening with or without food.  It would be best to take the doses about the same time each day. ° °If you have difficulty swallowing the tablet whole please discuss with your pharmacist how to take the medication safely. ° °Take Eliquis® exactly as prescribed by your doctor and DO NOT stop taking Eliquis® without talking to the doctor who prescribed the medication.  Stopping may increase your risk of developing a new clot or stroke.  Refill your prescription before you run out. ° °After discharge, you should have regular check-up appointments with your healthcare provider that is prescribing your Eliquis®.  In the future your dose may need to be changed if your kidney function or weight changes by a significant amount or as you get older. ° °What do you do if you miss a dose? °If you miss a dose, take it as soon as you remember on the same day and resume taking twice daily.  Do not take more than one dose of ELIQUIS at the same time. ° °Important Safety Information °A possible side effect of Eliquis® is bleeding. You should call your healthcare provider right away if you experience any of the following: °? Bleeding from an injury or your nose that does not stop. °? Unusual colored urine (red or dark brown) or unusual colored stools (red or  black). °? Unusual bruising for unknown reasons. °? A serious fall or if you hit your head (even if there is no bleeding). ° °Some medicines may interact with Eliquis® and might increase your risk of bleeding or clotting while on Eliquis®. To help avoid this, consult your healthcare provider or pharmacist prior to using any new prescription or non-prescription medications, including herbals, vitamins, non-steroidal anti-inflammatory drugs (NSAIDs) and supplements. ° °This website has more information on Eliquis® (apixaban): [email or web address].com. ° ° ° ° ° ° ° ° °

## 2019-10-20 NOTE — Progress Notes (Addendum)
PROGRESS NOTE  Brief Narrative: Regina White is a 64 y.o. female with a history of COPD, asthma, pulmonary HTN, HFrEF, recurrent DVT, hypotension on midodrine, C. 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 She has been rehabilitating at [facility] where she began feeling tired with cough about 1 week ago and subsequently tested positive for covid-19 on 12/5. Due to progressing dyspnea, she was taken to the ED 12/11 where she was tachypneic, hypoxic requiring 3L O2, with patchy infiltrates bilaterally, CRP elevated to 8.3. Remdesivir and steroids were given, and the patient admitted, subsequently transferred to [facility] 12/12. In the interim, hypoxia has progressed to requiring 15L NRB  Subjective: No more short of breath, about the same this morning as from yesterday. Denies pain anywhere, including chest pain. No leg swelling. Has not been getting out of bed or eating much for the past several days. Called husband at home and mobile numbers listed without answer.  Objective: BP (!) 86/61 (BP Location: Right Arm)   Pulse 90   Temp (!) 96.8 F (36 C) (Axillary)   Resp (!) 25   Ht 5\' 5"  (1.651 m)   Wt 95.7 kg   LMP  (LMP Unknown)   SpO2 98%   BMI 35.11 kg/m   Gen: Chronically ill-appearing female in no distress Pulm: Tachypneic without accessory muscle use, speaking in normal sentences with HFNC + NRB. Crackles diffusely.   CV: RRR, no murmur, no JVD, no edema GI: Soft, NT, ND, +BS  Neuro: Alert and oriented. Too weak to raise up for posterior pulmonary exam. RUE with decreased movement and sensation with flexion contracture right wrist.  Assessment & Plan: Principal Problem:   COVID-19 virus infection Active Problems:   Hypertension, benign   Hypothyroidism   Pulmonary hypertension (HCC)   Hyponatremia  Acute hypoxic respiratory failure due to covid-19 pneumonia in patient with  significant pulmonary and cardiac comorbidities including COPD, asthma, OSA, pulmonary HTN, HFrEF.  - Now with progressive hypoxia since admission. Discussed convalescent plasma's rationale for use under EUA by FDA, it's potential, but unproven, benefits, and it's potential risks. She opts to proceed, signed consent.  - Continue remdesivir (12/11 - 12/15) - Continue steroids, give additional dose right now.  - Continue home sinulair, dulera (for advair), atrovent and albuterol MDI's.  - Discussed very high risk of decompensation with the patient who confirms full code, would want trial of intubation if felt to be life-saving.  - PCT 0.11, would not benefit from antibiotics. Will not start abx especially with history of C. diff.   AKI: Suspected to be prerenal, possibly complicated by hypotension. Does not exhibit physiology of sepsis.   Recurrent DVT s/p thrombectomy and RUE intramuscular hematoma in setting of lovenox: Pt wishes to avoid lovenox.  - Reorder pt's home apixaban, confirmed dose is 2.5mg  po BID due to bleeding history.   Pulmonary HTN: RHC 2018 with PASP ~75. Thought to be overlap of Groups I, II, and III. - Follow up outpatient with Dr. 2019.   Combined HFrEF: Appear volume down-to-euvolemic. Echo July 2019 w/EF 35-40%, G1DD. LVEF improved since that time based on echo Jan 2020.  - Does not appear to be on regular guideline-directed therapy, likely due to hypotension. Will not start at this time.   Orthostatic hypotension:  - Continue midodrine  Hypothyroidism: Recent TSH at goal 0.567  - Continue synthroid   Nausea, GERD: Note normal EGD Feb 2020. 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 Erlinda Hong. Last intra-articular steroid injection left hip 11/13. - Continue gabapentin, prn oxycodone - PT/OT, continue splint.  Obesity: BMI 35.  PAD: s/p left femoral vein angioplasty with iliac  stenting, 06/26/19.  - Eliquis as above  Time spent: 35 minutes.  Patrecia Pour, MD Pager on [facility] 10/20/2019, 2:08 PM

## 2019-10-20 NOTE — Progress Notes (Addendum)
Marland Kitchen  PROGRESS NOTE    Regina White  JFH:[id number] DOB: 04-06-1956 DOA: 10/19/2019 PCP: Cain Sieve, MD   Brief Narrative:   Regina White  is a 63 y.o. 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 Diff 12/13/2018, h/o intramuscular hematoma R triceps muscle  , covid -19 positive 6 days ago per patient, presents with dyspnea x 4 days. Slight cough with white / clear sputum. Slight loose stool.  Pt denies alteration in sense of taste or smell. Pt denies fever, chills, cp, palp, n/v, abd pain, brbpr, black stool. Pt was brought for evaluation of dyspnea.   10/20/19: Currently on NRB. Will get to [facility].   Assessment & Plan:   Principal Problem:   COVID-19 virus infection Active Problems:   Hypertension, benign   Hypothyroidism   Pulmonary hypertension (HCC)   Hyponatremia  COVID 19 PNA Sepsis secondary to COVID     - COVID ag positive     - currently on decadron, remdesivir, dulera inhaler, atrovent inhaler     - PRN albuterol     - trend inflammatory markers     - sepsis criteria: WBC < 4, tachypnea, elevated CRP, Temp 96.6      - required NRB ON, will transfer to [facility]  Hyponatremia (mild)     - resolved, monitor  Asthma/Copd     - dulera, atrovent, PRN albuterol, singular     - currently on NRB for O2 support  H/o Recurrent  DVT     - continue eliquis  Hypothyroidism     - continue levothyroxine  Nausea     - PRN reglan  Orthostasis      - continue midodrine  GERD     - continue pepcid  DVT prophylaxis: eliquis Code Status: FULL   Disposition Plan: TBD  Antimicrobials:  . remdesivir   Subjective: "Thank you."  Objective: Vitals:   10/20/19 0946 10/20/19 0947 10/20/19 0948 10/20/19 0949  BP:      Pulse: 79 78 78 77  Resp: (!) 27 (!) 28 (!) 30 (!) 29  Temp:      TempSrc:      SpO2: 95% 95% 97% 96%  Weight:      Height:        Intake/Output Summary (Last 24 hours) at  10/20/2019 1051 Last data filed at 10/19/2019 2311 Gross per 24 hour  Intake 250 ml  Output --  Net 250 ml   Filed Weights   10/19/19 1720  Weight: 95.7 kg    Examination:  General: 63 y.o. ill appearing female resting in bed in NAD Cardiovascular: RRR, +S1, S2, no m/g/r Respiratory: shallow, tachypnic, b/l rhonchi, increased WOB GI: BS+, NDNT, no masses noted, no organomegaly noted MSK: No e/c/c Neuro: alert to name, follows commands Psyc: Appropriate interaction and affect, calm/cooperative   Data Reviewed: I have personally reviewed following labs and imaging studies.  CBC: Recent Labs  Lab 10/19/19 1945 10/20/19 0546  WBC 6.5 3.6*  NEUTROABS 5.2 3.3  HGB 15.3* 14.8  HCT 49.4* 47.5*  MCV 83.0 84.1  PLT 186 172   Basic Metabolic Panel: Recent Labs  Lab 10/19/19 1945 10/20/19 0546  NA 133* 139  K 4.3 4.6  CL 102 103  CO2 20* 25  GLUCOSE 96 162*  BUN 14 16  CREATININE 0.79 1.07*  CALCIUM 8.4* 8.6*   GFR: Estimated Creatinine Clearance: 60.8 mL/min (A) (by C-G formula based on SCr of 1.07  mg/dL (H)). Liver Function Tests: Recent Labs  Lab 10/19/19 1945 10/20/19 0546  AST 26 23  ALT 23 22  ALKPHOS 64 61  BILITOT 1.1 0.9  PROT 6.1* 5.8*  ALBUMIN 3.4* 3.1*   No results for input(s): LIPASE, AMYLASE in the last 168 hours. No results for input(s): AMMONIA in the last 168 hours. Coagulation Profile: No results for input(s): INR, PROTIME in the last 168 hours. Cardiac Enzymes: No results for input(s): CKTOTAL, CKMB, CKMBINDEX, TROPONINI in the last 168 hours. BNP (last 3 results) No results for input(s): PROBNP in the last 8760 hours. HbA1C: No results for input(s): HGBA1C in the last 72 hours. CBG: Recent Labs  Lab 10/20/19 0825  GLUCAP 148*   Lipid Profile: Recent Labs    10/19/19 1945  TRIG 113   Thyroid Function Tests: No results for input(s): TSH, T4TOTAL, FREET4, T3FREE, THYROIDAB in the last 72 hours. Anemia Panel: Recent Labs     10/19/19 1945  FERRITIN 101   Sepsis Labs: Recent Labs  Lab 10/19/19 1945  PROCALCITON 0.11  LATICACIDVEN 1.2    Recent Results (from the past 240 hour(s))  Blood Culture (routine x 2)     Status: None (Preliminary result)   Collection Time: 10/19/19  7:46 PM   Specimen: BLOOD LEFT HAND  Result Value Ref Range Status   Specimen Description BLOOD LEFT HAND  Final   Special Requests   Final    BOTTLES DRAWN AEROBIC AND ANAEROBIC Blood Culture results may not be optimal due to an inadequate volume of blood received in culture bottles   Culture   Final    NO GROWTH < 12 HOURS Performed at [facility], [place] [address] [place], [place] [address]    Report Status PENDING  Incomplete  Blood Culture (routine x 2)     Status: None (Preliminary result)   Collection Time: 10/19/19  8:59 PM   Specimen: BLOOD LEFT ARM  Result Value Ref Range Status   Specimen Description BLOOD LEFT ARM  Final   Special Requests   Final    BOTTLES DRAWN AEROBIC AND ANAEROBIC Blood Culture adequate volume   Culture   Final    NO GROWTH < 12 HOURS Performed at [facility], [place] [address], [place], [place] [address]    Report Status PENDING  Incomplete      Radiology Studies: DG Chest Port 1 View  Result Date: 10/19/2019 CLINICAL DATA:  COVID, pneumonia EXAM: PORTABLE CHEST 1 VIEW COMPARISON:  08/05/2019 FINDINGS: Cardiomegaly. There is patchy heterogeneous airspace disease bilaterally. The visualized skeletal structures are unremarkable. IMPRESSION: 1. Patchy heterogeneous airspace disease bilaterally, consistent with multifocal infection and in keeping with reported diagnosis of COVID-19. 2. Cardiomegaly. Electronically Signed   By: Eddie Candle M.D.   On: 10/19/2019 19:31     Scheduled Meds: . sodium chloride   Intravenous Once  . acidophilus  2 capsule Oral TID AC  . apixaban  2.5 mg Oral BID  . cholecalciferol  2,000 Units Oral Daily  . cyclobenzaprine  5 mg Oral See admin  instructions  . dexamethasone (DECADRON) injection  6 mg Intravenous Q12H  . famotidine  20 mg Oral BID  . feeding supplement (PRO-STAT SUGAR FREE 64)  30 mL Oral QPM  . gabapentin  300-600 mg Oral TID  . ipratropium  2 puff Inhalation Q6H  . levothyroxine  200 mcg Oral QAC breakfast  . midodrine  10 mg Oral TID WC  . mometasone-formoterol  2 puff Inhalation BID  . montelukast  10 mg Oral Daily  . multivitamin with minerals  1 tablet Oral Daily  . sodium chloride flush  3 mL Intravenous Q12H   Continuous Infusions: . sodium chloride    . remdesivir 100 mg in NS 100 mL       LOS: 0 days    Time spent: 25 minutes spent in the coordination of care today.    Teddy Spikeyrone A Sana Tessmer, DO [facility] Pager [phone]  If 7PM-7AM, please contact night-coverage [email or web address] Password [email or web address] 10/20/2019, 10:51 AM

## 2019-10-20 NOTE — ED Notes (Signed)
RN attempted to call report x1. Told I needed to call back because it's shift change.

## 2019-10-20 NOTE — ED Notes (Signed)
Dr. Maudie Mercury aware pt was switched to NRB at 15L. Md stated that he would change level of care for admission.

## 2019-10-20 NOTE — ED Notes (Signed)
ED TO INPATIENT HANDOFF REPORT  ED Nurse Name and Phone #: [phone] Lauree Chandler., RN  S Name/Age/Gender Regina White 63 y.o. female Room/Bed: 024C/024C  Code Status   Code Status: Full Code  Home/SNF/Other Skilled nursing facility Patient oriented to: self Is this baseline? Yes   Triage Complete: Triage complete  Chief Complaint COVID-19 virus infection [U07.1]  Triage Note BIB EMS from [facility] for worsening shortness of breath over the past 4 days. Tested positive for covid 6 days ago. Pt is A&O x4. Afebrile.     Allergies Allergies  Allergen Reactions  . Penicillins Anaphylaxis    Breathing problems Did it involve swelling of the face/tongue/throat, SOB, or low BP? Yes Did it involve sudden or severe rash/hives, skin peeling, or any reaction on the inside of your mouth or nose? Unknown Did you need to seek medical attention at a hospital or doctor's office? Unknown When did it last happen?unknown If all above answers are "NO", may proceed with cephalosporin use. Patient tolerated Ceftriaxone September 2020  . Erythromycin Nausea And Vomiting and Rash    STOMACH PAIN  . Levofloxacin Other (See Comments)    Muscle aches  . Lidocaine Rash  . Clindamycin Other (See Comments)    Recurrent C diff  . Levothyroxine Other (See Comments)    Vaginal bleeding Reaction to generic (pt currently takes name brand Synthroid)  . Tiotropium Other (See Comments)    Coughing up blood  . Doxycycline Nausea And Vomiting  . Sulfa Antibiotics Rash    Level of Care/Admitting Diagnosis ED Disposition    ED Disposition Condition [facility] Area: [place] [[id number]]  Level of Care: Telemetry [5]  Covid Evaluation: Confirmed COVID Positive  Diagnosis: COVID-19 virus infection [[id number]]  Admitting Physician: Jani Gravel [place]  Attending Physician: Jani Gravel [phone]  Estimated length of stay: past midnight tomorrow  Certification:: I  certify this patient will need inpatient services for at least 2 midnights       B Medical/Surgery History Past Medical History:  Diagnosis Date  . Asthma   . CHF (congestive heart failure) ([place])   . Chronic back pain   . COPD (chronic obstructive pulmonary disease) ([place])   . Pulmonary hypertension ([place])    Past Surgical History:  Procedure Laterality Date  . BACK SURGERY       A IV Location/Drains/Wounds Patient Lines/Drains/Airways Status   Active Line/Drains/Airways    Name:   Placement date:   Placement time:   Site:   Days:   Peripheral IV 10/19/19 Left;Anterior Forearm   10/19/19    2117    Forearm   1   External Urinary Catheter   10/19/19    2021    --   1   Pressure Injury 08/05/19 Buttocks Right Stage II -  Partial thickness loss of dermis presenting as a shallow open ulcer with a red, pink wound bed without slough. quarter sized open wound, patient states area has been hurting since before she left the ho   08/05/19    1754     76   Pressure Injury 08/05/19 Buttocks Left Stage II -  Partial thickness loss of dermis presenting as a shallow open ulcer with a red, pink wound bed without slough. quarter sized open wound, patient states area has been hurting since before she left the hos   08/05/19    1758     76   Pressure Injury 08/05/19 Ankle  Left Stage I -  Intact skin with non-blanchable redness of a localized area usually over a bony prominence.   08/05/19    1806     76          Intake/Output Last 24 hours  Intake/Output Summary (Last 24 hours) at 10/20/2019 4496 Last data filed at 10/19/2019 2311 Gross per 24 hour  Intake 250 ml  Output --  Net 250 ml    Labs/Imaging Results for orders placed or performed during the hospital encounter of 10/19/19 (from the past 48 hour(s))  Lactic acid, plasma     Status: None   Collection Time: 10/19/19  7:45 PM  Result Value Ref Range   Lactic Acid, Venous 1.2 0.5 - 1.9 mmol/L    Comment: Performed at [facility], [address] [place], [place] [address]  CBC WITH DIFFERENTIAL     Status: Abnormal   Collection Time: 10/19/19  7:45 PM  Result Value Ref Range   WBC 6.5 4.0 - 10.5 K/uL   RBC 5.95 (H) 3.87 - 5.11 MIL/uL   Hemoglobin 15.3 (H) 12.0 - 15.0 g/dL   HCT 49.4 (H) 36.0 - 46.0 %   MCV 83.0 80.0 - 100.0 fL   MCH 25.7 (L) 26.0 - 34.0 pg   MCHC 31.0 30.0 - 36.0 g/dL   RDW 17.6 (H) 11.5 - 15.5 %   Platelets 186 150 - 400 K/uL   nRBC 0.0 0.0 - 0.2 %   Neutrophils Relative % 80 %   Neutro Abs 5.2 1.7 - 7.7 K/uL   Lymphocytes Relative 12 %   Lymphs Abs 0.8 0.7 - 4.0 K/uL   Monocytes Relative 8 %   Monocytes Absolute 0.5 0.1 - 1.0 K/uL   Eosinophils Relative 0 %   Eosinophils Absolute 0.0 0.0 - 0.5 K/uL   Basophils Relative 0 %   Basophils Absolute 0.0 0.0 - 0.1 K/uL   Immature Granulocytes 0 %   Abs Immature Granulocytes 0.02 0.00 - 0.07 K/uL    Comment: Performed at [facility], [place] [address] [place], [place] [address]  Comprehensive metabolic panel     Status: Abnormal   Collection Time: 10/19/19  7:45 PM  Result Value Ref Range   Sodium 133 (L) 135 - 145 mmol/L   Potassium 4.3 3.5 - 5.1 mmol/L   Chloride 102 98 - 111 mmol/L   CO2 20 (L) 22 - 32 mmol/L   Glucose, Bld 96 70 - 99 mg/dL   BUN 14 8 - 23 mg/dL   Creatinine, Ser 0.79 0.44 - 1.00 mg/dL   Calcium 8.4 (L) 8.9 - 10.3 mg/dL   Total Protein 6.1 (L) 6.5 - 8.1 g/dL   Albumin 3.4 (L) 3.5 - 5.0 g/dL   AST 26 15 - 41 U/L   ALT 23 0 - 44 U/L   Alkaline Phosphatase 64 38 - 126 U/L   Total Bilirubin 1.1 0.3 - 1.2 mg/dL   GFR calc non Af Amer >60 >60 mL/min   GFR calc Af Amer >60 >60 mL/min   Anion gap 11 5 - 15    Comment: Performed at [facility], [place] [address], [place], [place] [address]  D-dimer, quantitative     Status: Abnormal   Collection Time: 10/19/19  7:45 PM  Result Value Ref Range   D-Dimer, Quant 0.83 (H) 0.00 - 0.50 ug/mL-FEU    Comment: (NOTE) At the manufacturer cut-off of 0.50 ug/mL  FEU, this assay  has been documented to exclude PE with a sensitivity and negative predictive value of 97 to 99%.  At this time, this assay has not been approved by the FDA to exclude DVT/VTE. Results should be correlated with clinical presentation. Performed at [facility], [place] [address] [place], [place] [address]   Procalcitonin     Status: None   Collection Time: 10/19/19  7:45 PM  Result Value Ref Range   Procalcitonin 0.11 ng/mL    Comment:        Interpretation: PCT (Procalcitonin) <= 0.5 ng/mL: Systemic infection (sepsis) is not likely. Local bacterial infection is possible. (NOTE)       Sepsis PCT Algorithm           Lower Respiratory Tract                                      Infection PCT Algorithm    ----------------------------     ----------------------------         PCT < 0.25 ng/mL                PCT < 0.10 ng/mL         Strongly encourage             Strongly discourage   discontinuation of antibiotics    initiation of antibiotics    ----------------------------     -----------------------------       PCT 0.25 - 0.50 ng/mL            PCT 0.10 - 0.25 ng/mL               OR       >80% decrease in PCT            Discourage initiation of                                            antibiotics      Encourage discontinuation           of antibiotics    ----------------------------     -----------------------------         PCT >= 0.50 ng/mL              PCT 0.26 - 0.50 ng/mL               AND        <80% decrease in PCT             Encourage initiation of                                             antibiotics       Encourage continuation           of antibiotics    ----------------------------     -----------------------------        PCT >= 0.50 ng/mL                  PCT > 0.50 ng/mL               AND         increase in PCT  Strongly encourage                                      initiation of antibiotics    Strongly encourage escalation            of antibiotics                                     -----------------------------                                           PCT <= 0.25 ng/mL                                                 OR                                        > 80% decrease in PCT                                     Discontinue / Do not initiate                                             antibiotics Performed at [facility], [place] [address] [place], [place] [address]   Lactate dehydrogenase     Status: Abnormal   Collection Time: 10/19/19  7:45 PM  Result Value Ref Range   LDH 272 (H) 98 - 192 U/L    Comment: Performed at [facility], [place] [address], [place], [place] [address]  Ferritin     Status: None   Collection Time: 10/19/19  7:45 PM  Result Value Ref Range   Ferritin 101 11 - 307 ng/mL    Comment: Performed at [facility], [place] [address] [place], [place] [address]  Fibrinogen     Status: Abnormal   Collection Time: 10/19/19  7:45 PM  Result Value Ref Range   Fibrinogen 497 (H) 210 - 475 mg/dL    Comment: Performed at [facility] [address], [place], [place] [address]  C-reactive protein     Status: Abnormal   Collection Time: 10/19/19  7:45 PM  Result Value Ref Range   CRP 8.3 (H) <1.0 mg/dL    Comment: Performed at [facility] [address], [place], [place] [address]  Triglycerides     Status: None   Collection Time: 10/19/19  7:45 PM  Result Value Ref Range   Triglycerides 113 <150 mg/dL    Comment: Performed at [facility] [address] [place], [place] [address]  POC SARS Coronavirus 2 Ag-ED - Nasal Swab (BD Veritor Kit)     Status: Abnormal   Collection Time: 10/19/19 10:18 PM  Result Value Ref Range  SARS Coronavirus 2 Ag POSITIVE (A) NEGATIVE    Comment: (NOTE) SARS-CoV-2 antigen PRESENT. Positive results indicate the presence of viral antigens, but clinical correlation with patient history and other diagnostic information  is necessary to determine patient infection status.  Positive results do not rule out bacterial infection or co-infection  with other viruses. False positive results are rare but can occur, and confirmatory RT-PCR testing may be appropriate in some circumstances. The expected result is Negative. Fact Sheet for Patients: PodPark.tn Fact Sheet for Providers: GiftContent.is  This test is not yet approved or cleared by the [place] FDA and  has been authorized for detection and/or diagnosis of SARS-CoV-2 by FDA under an Emergency Use Authorization (EUA).  This EUA will remain in effect (meaning this test can be used) for the duration of  the COVID-19 declaration under Section 564(b)(1) of the Act, 21 U.S.C. section 360bbb-3(b)(1), unless the a uthorization is terminated or revoked sooner.   ABO/Rh     Status: None   Collection Time: 10/19/19 11:09 PM  Result Value Ref Range   ABO/RH(D)      AB POS Performed at [facility] [address], [place], [place] [address]   CBC with Differential/Platelet     Status: Abnormal   Collection Time: 10/20/19  5:46 AM  Result Value Ref Range   WBC 3.6 (L) 4.0 - 10.5 K/uL   RBC 5.65 (H) 3.87 - 5.11 MIL/uL   Hemoglobin 14.8 12.0 - 15.0 g/dL   HCT 47.5 (H) 36.0 - 46.0 %   MCV 84.1 80.0 - 100.0 fL   MCH 26.2 26.0 - 34.0 pg   MCHC 31.2 30.0 - 36.0 g/dL   RDW 17.2 (H) 11.5 - 15.5 %   Platelets 172 150 - 400 K/uL   nRBC 0.0 0.0 - 0.2 %   Neutrophils Relative % 90 %   Neutro Abs 3.3 1.7 - 7.7 K/uL   Lymphocytes Relative 7 %   Lymphs Abs 0.3 (L) 0.7 - 4.0 K/uL   Monocytes Relative 3 %   Monocytes Absolute 0.1 0.1 - 1.0 K/uL   Eosinophils Relative 0 %   Eosinophils Absolute 0.0 0.0 - 0.5 K/uL   Basophils Relative 0 %   Basophils Absolute 0.0 0.0 - 0.1 K/uL   Immature Granulocytes 0 %   Abs Immature Granulocytes 0.01 0.00 - 0.07 K/uL    Comment: Performed at [facility], [place] [address], [place], [place] [address]  Comprehensive metabolic panel     Status: Abnormal   Collection Time: 10/20/19  5:46 AM  Result Value Ref Range   Sodium 139 135 - 145 mmol/L   Potassium 4.6 3.5 - 5.1 mmol/L   Chloride 103 98 - 111 mmol/L   CO2 25 22 - 32 mmol/L   Glucose, Bld 162 (H) 70 - 99 mg/dL   BUN 16 8 - 23 mg/dL   Creatinine, Ser 1.07 (H) 0.44 - 1.00 mg/dL   Calcium 8.6 (L) 8.9 - 10.3 mg/dL   Total Protein 5.8 (L) 6.5 - 8.1 g/dL   Albumin 3.1 (L) 3.5 - 5.0 g/dL   AST 23 15 - 41 U/L   ALT 22 0 - 44 U/L   Alkaline Phosphatase 61 38 - 126 U/L   Total Bilirubin 0.9 0.3 - 1.2 mg/dL   GFR calc non Af Amer 55 (L) >60 mL/min   GFR calc Af Amer >60 >60 mL/min   Anion gap 11 5 - 15  Comment: Performed at [facility], [place] [address], [place], [place] [address]   DG Chest Port 1 View  Result Date: 10/19/2019 CLINICAL DATA:  COVID, pneumonia EXAM: PORTABLE CHEST 1 VIEW COMPARISON:  08/05/2019 FINDINGS: Cardiomegaly. There is patchy heterogeneous airspace disease bilaterally. The visualized skeletal structures are unremarkable. IMPRESSION: 1. Patchy heterogeneous airspace disease bilaterally, consistent with multifocal infection and in keeping with reported diagnosis of COVID-19. 2. Cardiomegaly. Electronically Signed   By: Eddie Candle M.D.   On: 10/19/2019 19:31    Pending Labs Unresulted Labs (From admission, onward)    Start     Ordered   10/20/19 0500  CBC with Differential/Platelet  Daily,   R     10/19/19 2110   10/20/19 0500  Comprehensive metabolic panel  Daily,   R     10/19/19 2110   10/19/19 1806  Lactic acid, plasma  Now then every 2 hours,   STAT     10/19/19 1806   10/19/19 1806  Blood Culture (routine x 2)  BLOOD CULTURE X 2,   STAT     10/19/19 1806          Vitals/Pain Today's Vitals   10/20/19 0415 10/20/19 0500 10/20/19 0600 10/20/19 0615  BP:  94/61 105/74   Pulse: 81 71  (!) 59  Resp: (!) 23 (!) 25 (!) 26 (!) 22  Temp:       TempSrc:      SpO2: 94% 96%  97%  Weight:      Height:      PainSc:        Isolation Precautions Airborne and Contact precautions  Medications Medications  dexamethasone (DECADRON) injection 6 mg (6 mg Intravenous Given 10/19/19 2119)  sodium chloride flush (NS) 0.9 % injection 3 mL (3 mLs Intravenous Given 10/19/19 2120)  sodium chloride flush (NS) 0.9 % injection 3 mL (has no administration in time range)  0.9 %  sodium chloride infusion (has no administration in time range)  oxyCODONE (Oxy IR/ROXICODONE) immediate release tablet 5 mg (5 mg Oral Given 10/19/19 2313)  midodrine (PROAMATINE) tablet 10 mg (has no administration in time range)  levothyroxine (SYNTHROID) tablet 200 mcg (has no administration in time range)  acidophilus (RISAQUAD) capsule 2 capsule (has no administration in time range)  famotidine (PEPCID) tablet 20 mg (20 mg Oral Given 10/20/19 0111)  metoCLOPramide (REGLAN) tablet 10 mg (has no administration in time range)  gabapentin (NEURONTIN) capsule 300-600 mg (300 mg Oral Given 10/20/19 0111)  multivitamin with minerals tablet 1 tablet (has no administration in time range)  cholecalciferol (VITAMIN D3) tablet 2,000 Units (has no administration in time range)  albuterol (VENTOLIN HFA) 108 (90 Base) MCG/ACT inhaler 2 puff (has no administration in time range)  mometasone-formoterol (DULERA) 200-5 MCG/ACT inhaler 2 puff (2 puffs Inhalation Given 10/20/19 0111)  ipratropium (ATROVENT HFA) inhaler 2 puff (2 puffs Inhalation Given 10/20/19 0112)  montelukast (SINGULAIR) tablet 10 mg (has no administration in time range)  remdesivir 200 mg in sodium chloride 0.9% 250 mL IVPB (0 mg Intravenous Stopped 10/19/19 2311)    Followed by  remdesivir 100 mg in sodium chloride 0.9 % 100 mL IVPB (has no administration in time range)  apixaban (ELIQUIS) tablet 2.5 mg (2.5 mg Oral Given 10/19/19 2241)  polyethylene glycol (MIRALAX / GLYCOLAX) packet 17 g (has no administration in  time range)  cyclobenzaprine (FLEXERIL) tablet 5 mg (has no administration in time range)  feeding supplement (PRO-STAT SUGAR FREE 64) liquid  30 mL (has no administration in time range)    Mobility walks with device High fall risk   Focused Assessments Pulmonary Assessment Handoff:  Lung sounds: L Breath Sounds: Inspiratory wheezes R Breath Sounds: Inspiratory wheezes O2 Device: Nasal Cannula O2 Flow Rate (L/min): 3 L/min      R Recommendations: See Admitting Provider Note  Report given to:   Additional Notes:

## 2019-10-20 NOTE — ED Notes (Signed)
[facility] here to [employer]

## 2019-10-21 LAB — ABO/RH: ABO/RH(D): AB POS

## 2019-10-21 LAB — TYPE AND SCREEN
ABO/RH(D): AB POS
Antibody Screen: NEGATIVE

## 2019-10-21 LAB — CBC WITH DIFFERENTIAL/PLATELET
Abs Immature Granulocytes: 0.03 10*3/uL (ref 0.00–0.07)
Basophils Absolute: 0 10*3/uL (ref 0.0–0.1)
Basophils Relative: 0 %
Eosinophils Absolute: 0 10*3/uL (ref 0.0–0.5)
Eosinophils Relative: 0 %
HCT: 47.5 % — ABNORMAL HIGH (ref 36.0–46.0)
Hemoglobin: 14.9 g/dL (ref 12.0–15.0)
Immature Granulocytes: 1 %
Lymphocytes Relative: 5 %
Lymphs Abs: 0.3 10*3/uL — ABNORMAL LOW (ref 0.7–4.0)
MCH: 26.3 pg (ref 26.0–34.0)
MCHC: 31.4 g/dL (ref 30.0–36.0)
MCV: 83.9 fL (ref 80.0–100.0)
Monocytes Absolute: 0.3 10*3/uL (ref 0.1–1.0)
Monocytes Relative: 6 %
Neutro Abs: 4.9 10*3/uL (ref 1.7–7.7)
Neutrophils Relative %: 88 %
Platelets: 204 10*3/uL (ref 150–400)
RBC: 5.66 MIL/uL — ABNORMAL HIGH (ref 3.87–5.11)
RDW: 17.2 % — ABNORMAL HIGH (ref 11.5–15.5)
WBC: 5.5 10*3/uL (ref 4.0–10.5)
nRBC: 0 % (ref 0.0–0.2)

## 2019-10-21 LAB — COMPREHENSIVE METABOLIC PANEL
ALT: 20 U/L (ref 0–44)
AST: 21 U/L (ref 15–41)
Albumin: 3.2 g/dL — ABNORMAL LOW (ref 3.5–5.0)
Alkaline Phosphatase: 58 U/L (ref 38–126)
Anion gap: 12 (ref 5–15)
BUN: 34 mg/dL — ABNORMAL HIGH (ref 8–23)
CO2: 22 mmol/L (ref 22–32)
Calcium: 8.7 mg/dL — ABNORMAL LOW (ref 8.9–10.3)
Chloride: 106 mmol/L (ref 98–111)
Creatinine, Ser: 0.75 mg/dL (ref 0.44–1.00)
GFR calc Af Amer: >60 mL/min (ref >60)
GFR calc non Af Amer: >60 mL/min (ref >60)
Glucose, Bld: 187 mg/dL — ABNORMAL HIGH (ref 70–99)
Potassium: 4.4 mmol/L (ref 3.5–5.1)
Sodium: 140 mmol/L (ref 135–145)
Total Bilirubin: 0.5 mg/dL (ref 0.3–1.2)
Total Protein: 6.2 g/dL — ABNORMAL LOW (ref 6.5–8.1)

## 2019-10-21 LAB — FERRITIN: Ferritin: 105 ng/mL (ref 11–307)

## 2019-10-21 LAB — C-REACTIVE PROTEIN: CRP: 12.5 mg/dL — ABNORMAL HIGH (ref <1.0)

## 2019-10-21 LAB — MAGNESIUM: Magnesium: 1.9 mg/dL (ref 1.7–2.4)

## 2019-10-21 LAB — D-DIMER, QUANTITATIVE: D-Dimer, Quant: 0.62 ug/mL-FEU — ABNORMAL HIGH (ref 0.00–0.50)

## 2019-10-21 NOTE — Progress Notes (Signed)
PROGRESS NOTE  Regina White  ZOX:[id number] DOB: 1956-10-13 DOA: 10/19/2019 PCP: Cain Sieve, MD   Brief Narrative: Regina White is a 63 y.o. female with a history of COPD, asthma, pulmonary HTN, HFrEF, recurrent DVT, hypotension on midodrine, C. 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 She has been rehabilitating at [facility] where she began feeling tired with cough about 1 week ago and subsequently tested positive for covid-19 on 12/5. Due to progressing dyspnea, she was taken to the ED 12/11 where she was tachypneic, hypoxic requiring 3L O2, with patchy infiltrates bilaterally, CRP elevated to 8.3. Remdesivir and steroids were given, and the patient admitted, subsequently transferred to [facility] 12/12 requiring 15L NRB. CRP also rising. Convalescent plasma ordered.  Assessment & Plan: Principal Problem:   COVID-19 virus infection Active Problems:   Hypertension, benign   Hypothyroidism   Pulmonary hypertension (HCC)   Hyponatremia  Acute hypoxic respiratory failure due to covid-19 pneumonia in patient with significant pulmonary and cardiac comorbidities including COPD, asthma, OSA, pulmonary HTN, HFrEF.  - Fortunately hypoxia has improved over past 24 hrs. Continue PCU level of care. - CCP ordered 12/12.  - Continue remdesivir (12/11 - 12/15) - Continue steroids, augmented dosing due to decompensation and continued upward trend of inflammatory marker.  - Continue home sinulair, dulera (for advair), atrovent and albuterol MDI's.  - Discussed very high risk of decompensation with the patient who confirms full code, would want trial of intubation if felt to be life-saving.  - PCT 0.11, would not benefit from antibiotics. Will not start abx especially with history of C. diff.   Severe sepsis: Leukopenia, tachypnea, hypothermia with AKI due to covid infection. Improving  parameters. No further GDT warranted.  AKI: Suspected to be prerenal, possibly complicated by hypotension. No longer exhibits physiology of sepsis.  - Improved, continue to avoid nephrotoxins and monitor.  Recurrent DVT s/p thrombectomy and RUE intramuscular hematoma in setting of lovenox: Pt wishes to avoid lovenox.  - Reorder pt's home apixaban, confirmed dose is 2.5mg  po BID due to bleeding history.   Pulmonary HTN: RHC 2018 with PASP ~75. Thought to be overlap of Groups I, II, and III. - Follow up outpatient with Dr. Elza Rafter.   Combined HFrEF: Appear volume down-to-euvolemic. Echo July 2019 w/EF 35-40%, G1DD. LVEF improved since that time based on echo Jan 2020.  - Does not appear to be on regular guideline-directed therapy, likely due to hypotension. Will not start at this time.   Orthostatic hypotension:  - Continue midodrine  Hypothyroidism: Recent TSH at goal 0.567  - Continue synthroid   Nausea, GERD: Note normal EGD Feb 2020. 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 Roda Shutters. Last intra-articular steroid injection left hip 11/13. - Continue gabapentin, prn oxycodone - PT/OT, continue splint.  Obesity: BMI 35.  PAD: s/p left femoral vein angioplasty with iliac stenting, 06/26/19.  - Eliquis as above  RN Pressure Injury Documentation: Pressure Injury 08/05/19 Buttocks Right Stage II -  Partial thickness loss of dermis presenting as a shallow open ulcer with a red, pink wound bed without slough. quarter sized open wound, patient states area has been hurting since before she left the ho (Active)  08/05/19 1754  Location: Buttocks  Location Orientation: Right  Staging: Stage II -  Partial thickness loss of dermis presenting as a shallow open ulcer with a  red, pink wound bed without slough.  Wound Description (Comments): quarter sized open wound, patient states area has been hurting  since before she left the hospital on Thursday.  Patient states she was told by hospital staff at that time that she had a "blister" on her bottom.    Present on Admission:      Pressure Injury 08/05/19 Buttocks Left Stage II -  Partial thickness loss of dermis presenting as a shallow open ulcer with a red, pink wound bed without slough. quarter sized open wound, patient states area has been hurting since before she left the hos (Active)  08/05/19 1758  Location: Buttocks  Location Orientation: Left  Staging: Stage II -  Partial thickness loss of dermis presenting as a shallow open ulcer with a red, pink wound bed without slough.  Wound Description (Comments): quarter sized open wound, patient states area has been hurting since before she left the hospital on Thursday.  Patient states she was told by hospital staff at that time that she had a "blister" on her bottom.    Present on Admission:      Pressure Injury 08/05/19 Ankle Left Stage I -  Intact skin with non-blanchable redness of a localized area usually over a bony prominence. (Active)  08/05/19 1806  Location: Ankle  Location Orientation: Left  Staging: Stage I -  Intact skin with non-blanchable redness of a localized area usually over a bony prominence.  Wound Description (Comments):   Present on Admission:    DVT prophylaxis: Eliquis 2.5mg  po BID per hematology at previous hospitalization Code Status: Full Family Communication: None at bedside, will reattempt call today. Disposition Plan: Uncertain, remain in PCU  Consultants:   None  Procedures:   None  Antimicrobials:  Remdesivir   Subjective: Feels much better. Shortness of breath decreased from yesterday, still constant, improved with oxygen. Doesn't think she got plasma yet. No chest pain or other complaints.  Objective: Vitals:   10/20/19 2336 10/21/19 0000 10/21/19 0537 10/21/19 0715  BP:  (!) 102/56 115/80 113/73  Pulse:  72 (!) 59 74  Resp:    20  Temp:  97.8 F (36.6 C)  97.7 F (36.5 C) (!) 97.4 F (36.3 C)  TempSrc: Oral  Axillary Oral  SpO2:  91% 91% 92%  Weight:      Height:        Intake/Output Summary (Last 24 hours) at 10/21/2019 0946 Last data filed at 10/21/2019 0500 Gross per 24 hour  Intake 660 ml  Output 300 ml  Net 360 ml   Filed Weights   10/19/19 1720  Weight: 95.7 kg    Gen: 63 y.o. female in no distress, smiling, eating breakfast.  Pulm: Non-labored breathing HFNC, no NRB. Crackles bilaterally.  CV: Regular rate and rhythm. No murmur, rub, or gallop. No JVD, no pedal edema. GI: Abdomen soft, non-tender, non-distended, with normoactive bowel sounds. No organomegaly or masses felt. Ext: Warm, decreased muscle bulk, Left great toe amputation, no ulcer. Right wrist contracture stable. In splint. Skin: No rashes, lesions or ulcers on visualized skin today. Neuro: Alert and oriented. Right radial nerve palsy. No new focal neurological deficits. Psych: Judgement and insight appear normal. Mood & affect appropriate.   Data Reviewed: I have personally reviewed following labs and imaging studies  CBC: Recent Labs  Lab 10/19/19 1945 10/20/19 0546 10/21/19 0040  WBC 6.5 3.6* 5.5  NEUTROABS 5.2 3.3 4.9  HGB 15.3* 14.8 14.9  HCT 49.4* 47.5* 47.5*  MCV 83.0  84.1 83.9  PLT 186 172 485   Basic Metabolic Panel: Recent Labs  Lab 10/19/19 1945 10/20/19 0546 10/21/19 0040  NA 133* 139 140  K 4.3 4.6 4.4  CL 102 103 106  CO2 20* 25 22  GLUCOSE 96 162* 187*  BUN 14 16 34*  CREATININE 0.79 1.07* 0.75  CALCIUM 8.4* 8.6* 8.7*  MG  --   --  1.9   GFR: Estimated Creatinine Clearance: 81.3 mL/min (by C-G formula based on SCr of 0.75 mg/dL). Liver Function Tests: Recent Labs  Lab 10/19/19 1945 10/20/19 0546 10/21/19 0040  AST 26 23 21   ALT 23 22 20   ALKPHOS 64 61 58  BILITOT 1.1 0.9 0.5  PROT 6.1* 5.8* 6.2*  ALBUMIN 3.4* 3.1* 3.2*   No results for input(s): LIPASE, AMYLASE in the last 168 hours. No  results for input(s): AMMONIA in the last 168 hours. Coagulation Profile: No results for input(s): INR, PROTIME in the last 168 hours. Cardiac Enzymes: No results for input(s): CKTOTAL, CKMB, CKMBINDEX, TROPONINI in the last 168 hours. BNP (last 3 results) No results for input(s): PROBNP in the last 8760 hours. HbA1C: No results for input(s): HGBA1C in the last 72 hours. CBG: Recent Labs  Lab 10/20/19 0825  GLUCAP 148*   Lipid Profile: Recent Labs    10/19/19 1945  TRIG 113   Thyroid Function Tests: No results for input(s): TSH, T4TOTAL, FREET4, T3FREE, THYROIDAB in the last 72 hours. Anemia Panel: Recent Labs    10/19/19 1945 10/21/19 0040  FERRITIN 101 105   Urine analysis:    Component Value Date/Time   COLORURINE YELLOW (A) 08/06/2019 1647   APPEARANCEUR HAZY (A) 08/06/2019 1647   LABSPEC 1.014 08/06/2019 1647   PHURINE 6.0 08/06/2019 1647   GLUCOSEU NEGATIVE 08/06/2019 1647   HGBUR NEGATIVE 08/06/2019 1647   BILIRUBINUR NEGATIVE 08/06/2019 1647   KETONESUR NEGATIVE 08/06/2019 1647   PROTEINUR NEGATIVE 08/06/2019 1647   NITRITE NEGATIVE 08/06/2019 1647   LEUKOCYTESUR MODERATE (A) 08/06/2019 1647   Recent Results (from the past 240 hour(s))  Blood Culture (routine x 2)     Status: None (Preliminary result)   Collection Time: 10/19/19  7:46 PM   Specimen: BLOOD LEFT HAND  Result Value Ref Range Status   Specimen Description BLOOD LEFT HAND  Final   Special Requests   Final    BOTTLES DRAWN AEROBIC AND ANAEROBIC Blood Culture results may not be optimal due to an inadequate volume of blood received in culture bottles   Culture   Final    NO GROWTH 2 DAYS Performed at [facility], [place] [address], [place], [place] [address]    Report Status PENDING  Incomplete  Blood Culture (routine x 2)     Status: None (Preliminary result)   Collection Time: 10/19/19  8:59 PM   Specimen: BLOOD LEFT ARM  Result Value Ref Range Status   Specimen Description BLOOD  LEFT ARM  Final   Special Requests   Final    BOTTLES DRAWN AEROBIC AND ANAEROBIC Blood Culture adequate volume   Culture   Final    NO GROWTH 2 DAYS Performed at [facility], [place] [address], [place], [place] [address]    Report Status PENDING  Incomplete      Radiology Studies: DG Chest Port 1 View  Result Date: 10/19/2019 CLINICAL DATA:  COVID, pneumonia EXAM: PORTABLE CHEST 1 VIEW COMPARISON:  08/05/2019 FINDINGS: Cardiomegaly. There is patchy heterogeneous airspace disease bilaterally. The  visualized skeletal structures are unremarkable. IMPRESSION: 1. Patchy heterogeneous airspace disease bilaterally, consistent with multifocal infection and in keeping with reported diagnosis of COVID-19. 2. Cardiomegaly. Electronically Signed   By: Lauralyn PrimesAlex  Bibbey M.D.   On: 10/19/2019 19:31    Scheduled Meds: . acidophilus  2 capsule Oral TID AC  . apixaban  2.5 mg Oral BID  . cholecalciferol  2,000 Units Oral Daily  . cyclobenzaprine  5 mg Oral QHS  . dexamethasone (DECADRON) injection  6 mg Intravenous Q12H  . famotidine  20 mg Oral BID  . feeding supplement (PRO-STAT SUGAR FREE 64)  30 mL Oral QPM  . gabapentin  300 mg Oral TID  . ipratropium  2 puff Inhalation Q6H  . levothyroxine  200 mcg Oral QAC breakfast  . midodrine  10 mg Oral TID WC  . mometasone-formoterol  2 puff Inhalation BID  . montelukast  10 mg Oral Daily  . multivitamin with minerals  1 tablet Oral Daily  . sodium chloride flush  3 mL Intravenous Q12H   Continuous Infusions: . sodium chloride    . remdesivir 100 mg in NS 100 mL 100 mg (10/20/19 1210)     LOS: 1 day   Time spent: 35 minutes.  Tyrone Nineyan B Estes Lehner, MD [facility] [email or web address] 10/21/2019, 9:46 AM

## 2019-10-21 NOTE — TOC Initial Note (Signed)
Transition of Care [facility]) - Initial/Assessment Note    Patient Details  Name: Regina White MRN: [id number] Date of Birth: 16-Nov-1955  Transition of [facility]) CM/SW Contact:    Shade Flood, LCSW Phone Number: 10/21/2019, 11:29 AM  Clinical Narrative:                  Pt admitted from [facility] rehab. Reviewed pt's record today. It appears pt admitted to [facility] back in September. Spoke with pt's husband by phone today to update. Pt's insurance is not listed on her demographic sheet but pt's husband states that she has Medicare and a supplement with [employer]. Pt's husband confirms that pt has been at [facility] but that she has been wanting to come home from there if possible.  TOC will follow and work with pt/family on dc planning needs as pt's stay progresses and PT/OT recommendations are in place.   Expected Discharge Plan: Skilled Nursing Facility Barriers to Discharge: Continued Medical Work up   Patient Goals and CMS Choice        Expected Discharge Plan and Services Expected Discharge Plan: [place] arrangements for the past 2 months: [place]                                      Prior Living Arrangements/Services Living arrangements for the past 2 months: [place] Lives with:: Spouse Patient language and need for interpreter reviewed:: Yes        Need for Family Participation in Patient Care: No (Comment) Care giver support system in place?: Yes (comment)   Criminal Activity/Legal Involvement Pertinent to Current Situation/Hospitalization: No - Comment as needed  Activities of Daily Living Home Assistive Devices/Equipment: Eyeglasses, Wheelchair, Transport planner, Grab bars in shower, Long-handled shoehorn, Raised toilet seat with rails, Hand-held shower hose, Long-handled sponge, Reacher, Other (Comment)(Implanted Device - Morphine Pump (left hip)) ADL Screening  (condition at time of admission) Patient's cognitive ability adequate to safely complete daily activities?: Yes Is the patient deaf or have difficulty hearing?: No Does the patient have difficulty seeing, even when wearing glasses/contacts?: No Does the patient have difficulty concentrating, remembering, or making decisions?: No Patient able to express need for assistance with ADLs?: Yes Does the patient have difficulty dressing or bathing?: Yes Independently performs ADLs?: Yes (appropriate for developmental age) Does the patient have difficulty walking or climbing stairs?: Yes Weakness of Legs: Both Weakness of Arms/Hands: Both(Left hand has better grip strength)  Permission Sought/Granted                  Emotional Assessment       Orientation: : Oriented to Self, Oriented to Place, Oriented to  Time, Oriented to Situation Alcohol / Substance Use: Not Applicable Psych Involvement: No (comment)  Admission diagnosis:  Pneumonia, viral [J12.9] Acute respiratory failure with hypoxia ([place]) [J96.01] COVID-19 virus infection [U07.1] COVID-19 [U07.1] Patient Active Problem List   Diagnosis Date Noted  . COVID-19 virus infection 10/19/2019  . Hyponatremia 10/19/2019  . Hypovolemic shock ([place]) 07/22/2019  . Acute respiratory failure with hypoxemia ([place]) 12/04/2018  . Acute respiratory failure with hypoxia ([place]) 06/02/2018  . Diarrhea of presumed infectious origin 02/17/2016  . Right acute serous otitis media 02/17/2016  . COPD (chronic obstructive pulmonary disease) ([place]) 12/23/2015  . Heart failure with preserved ejection fraction ([place])  12/23/2015  . DDD (degenerative disc disease) 09/25/2013  . Narrowing of intervertebral disc space 09/25/2013  . OSA (obstructive sleep apnea) 07/25/2013  . Deep vein thrombosis (DVT) (HCC) 06/25/2013  . Erythema 06/18/2013  . Left leg pain 06/18/2013  . Mediastinal lymphadenopathy 06/18/2013  . DVT, lower extremity (HCC) 06/14/2013  .  Pulmonary hypertension (HCC) 06/14/2013  . Asthma 09/21/2010  . Chronic pain 09/21/2010  . Hypertension, benign 09/21/2010  . Hypothyroidism 09/21/2010  . Venous stasis 09/21/2010   PCP:  Cain Sieve, MD Pharmacy:   [address] [place], [place] - [address] [place] [address] Phone: [phone] Fax: [phone]     Social Determinants of Health (SDOH) Interventions    Readmission Risk Interventions Readmission Risk Prevention Plan 10/21/2019  Transportation Screening Complete  Palliative Care Screening Not Applicable  Medication Review (RN Care Manager) Complete  Some recent data might be hidden

## 2019-10-22 DIAGNOSIS — R110 Nausea: Secondary | ICD-10-CM

## 2019-10-22 DIAGNOSIS — J9601 Acute respiratory failure with hypoxia: Secondary | ICD-10-CM

## 2019-10-22 LAB — COMPREHENSIVE METABOLIC PANEL
ALT: 22 U/L (ref 0–44)
AST: 22 U/L (ref 15–41)
Albumin: 3.2 g/dL — ABNORMAL LOW (ref 3.5–5.0)
Alkaline Phosphatase: 54 U/L (ref 38–126)
Anion gap: 10 (ref 5–15)
BUN: 43 mg/dL — ABNORMAL HIGH (ref 8–23)
CO2: 24 mmol/L (ref 22–32)
Calcium: 8.9 mg/dL (ref 8.9–10.3)
Chloride: 109 mmol/L (ref 98–111)
Creatinine, Ser: 0.76 mg/dL (ref 0.44–1.00)
GFR calc Af Amer: >60 mL/min (ref >60)
GFR calc non Af Amer: >60 mL/min (ref >60)
Glucose, Bld: 160 mg/dL — ABNORMAL HIGH (ref 70–99)
Potassium: 5 mmol/L (ref 3.5–5.1)
Sodium: 143 mmol/L (ref 135–145)
Total Bilirubin: 0.8 mg/dL (ref 0.3–1.2)
Total Protein: 6.1 g/dL — ABNORMAL LOW (ref 6.5–8.1)

## 2019-10-22 LAB — BPAM FFP
Blood Product Expiration Date: 202012140701
ISSUE DATE / TIME: 202012130706
Unit Type and Rh: 6200

## 2019-10-22 LAB — PREPARE FRESH FROZEN PLASMA

## 2019-10-22 LAB — C-REACTIVE PROTEIN: CRP: 5.3 mg/dL — ABNORMAL HIGH

## 2019-10-22 MED ORDER — DEXAMETHASONE SODIUM PHOSPHATE 10 MG/ML IJ SOLN
6.0000 mg | INTRAMUSCULAR | Status: DC
  Administered 2019-10-23 – 2019-10-25 (×3): 6 mg via INTRAVENOUS
  Filled 2019-10-22 (×3): qty 1

## 2019-10-22 NOTE — Evaluation (Addendum)
Physical Therapy Evaluation Patient Details Name: Regina White MRN: [id number] DOB: [birth year] Today's Date: 10/22/2019   History of Present Illness  Pt is a 63 y.o. female admitted from [facility] SNF on 10/19/19 with fatigue and cough; pt tested (+) COVID-19 on 12/5. Pt with AKI and severe sepsis. 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    Clinical Impression  Pt presents with an overall decrease in functional mobility secondary to above. PTA, pt has been receiving rehab at SNF (since initial hospital admission 07/2019), requiring assist to stand with lifting equipment. Today, pt required mod-maxA+2 for mobility, able to stand pivot to recliner; limited by RUE/LLE pain, generalized weakness and decreased activity tolerance. Pt would benefit from continued acute PT services to maximize functional mobility and independence prior to d/c with continued SNF-level therapies; pt agreeable for continued rehab at SNF, but does not want to return to [facility].     Follow Up Recommendations SNF;Supervision for mobility/OOB (pt does not want [facility])    Equipment Recommendations  (defer)    Recommendations for Other Services       Precautions / Restrictions Precautions Precautions: Fall;Other (comment) Precaution Comments: R radial nerve palsy (splint); L hip DJD very painful Restrictions Weight Bearing Restrictions: No      Mobility  Bed Mobility Overal bed mobility: Needs Assistance Bed Mobility: Supine to Sit     Supine to sit: Max assist;+2 for physical assistance     General bed mobility comments: Assist to hook RLE under L foot to assist to EOB, cues to reach with LUE, pt anxious about movement and pain; maxA+2 to assist trunk elevation and scoot hips to  EOB  Transfers Overall transfer level: Needs assistance Equipment used: 2 person hand held assist Transfers: Sit to/from [facility] Sit to Stand: Mod assist;+2 physical assistance         General transfer comment: ModA+2 to elevate trunk, pt with difficulty moving LLE to take complete step, difficulty accepting weight through painful LLE in order to step with R foot; ultimately requiring modA+2 to pivot from bed to recliner  Ambulation/Gait                Stairs            Wheelchair Mobility    Modified Rankin (Stroke Patients Only)       Balance Overall balance assessment: Needs assistance   Sitting balance-Leahy Scale: Poor Sitting balance - Comments: Initially maxA to maintain sitting balance with R-side lateral lean, able to progress to sitting with LUE support and min guard Postural control: Posterior lean;Right lateral lean   Standing balance-Leahy Scale: Poor                               Pertinent Vitals/Pain Pain Assessment: Faces Faces Pain Scale: Hurts even more Pain Location: RUE > L anterior thigh Pain Descriptors / Indicators: Grimacing;Guarding;Moaning Pain Intervention(s): Monitored during session;Repositioned    Home Living Family/patient expects to be discharged to:: Skilled nursing facility Living Arrangements: Spouse/significant other Available Help at Discharge: Family Type of Home: House Home Access: Stairs to enter   [employer] of Steps: (P) 2-3(Plans to rent a ramp for front entrance) Home Layout: (P) Two level(plans on staying on main level) Home Equipment: (P)  Toilet riser;Bedside commode;Walker - 4 wheels;Walker - standard Additional Comments: (P) [facility]    Prior Function Level of Independence: Needs assistance   Gait / Transfers Assistance Needed: Per chart, was ambulatory with RW/rollator prior to initial hospitalizations in 07/2019; has been at SNF for rehab,  use of standing frame for OOB with assist from staff  ADL's / Homemaking Assistance Needed: Assist for ADLs, likely being performed bed-level  Comments: (P) Prior to last admission, performs all ADLs and light IADLs. Husband does IADLs. DC to SNF and has been using standing frame for mobility and staff with with ADLs     Hand Dominance        Extremity/Trunk Assessment   Upper Extremity Assessment Upper Extremity Assessment: Generalized weakness;RUE deficits/detail;Defer to OT evaluation    Lower Extremity Assessment Lower Extremity Assessment: Generalized weakness;LLE deficits/detail LLE Deficits / Details: Limited by L anterior thigh/hip pain; hip flex <3/5, knee ext 3/5 almost full ROM LLE: Unable to fully assess due to pain LLE Sensation: decreased light touch LLE Coordination: decreased gross motor       Communication   Communication: (P) No difficulties  Cognition Arousal/Alertness: Awake/alert Behavior During Therapy: WFL for tasks assessed/performed;Anxious Overall Cognitive Status: Within Functional Limits for tasks assessed                                 General Comments: WFL for simple tasks, not formally assessed; anxious regarding movement, seems fearful of falling, but responded well to encouragement and redirection      General Comments      Exercises     Assessment/Plan    PT Assessment Patient needs continued PT services  PT Problem List Decreased strength;Decreased range of motion;Decreased activity tolerance;Decreased balance;Decreased mobility;Decreased knowledge of use of DME;Cardiopulmonary status limiting activity;Obesity       PT Treatment Interventions DME instruction;Gait training;Functional mobility training;Therapeutic activities;Therapeutic exercise;Balance training;Patient/family education    PT Goals (Current goals can be found in the Care Plan section)  Acute Rehab PT Goals Patient Stated Goal: Agreeable to continued  rehab at SNF, but does not want to [facility] to [facility] PT Goal Formulation: With patient Time For Goal Achievement: 11/05/19 Potential to Achieve Goals: Good    Frequency Min 2X/week   Barriers to discharge        Co-evaluation PT/OT/SLP Co-Evaluation/Treatment: Yes Reason for Co-Treatment: For patient/therapist safety;To address functional/ADL transfers PT goals addressed during session: Mobility/safety with mobility;Balance         AM-PAC PT "6 Clicks" Mobility  Outcome Measure Help needed turning from your back to your side while in a flat bed without using bedrails?: A Lot Help needed moving from lying on your back to sitting on the side of a flat bed without using bedrails?: A Lot Help needed moving to and from a bed to a chair (including a wheelchair)?: A Lot Help needed standing up from a chair using your arms (e.g., wheelchair or bedside chair)?: A Lot Help needed to walk in hospital room?: Total Help needed climbing 3-5 steps with a railing? : Total 6 Click Score: 10    End of Session Equipment Utilized During Treatment: Gait belt;Oxygen Activity Tolerance: Patient tolerated treatment well;Patient limited by pain Patient left: in chair;with call bell/phone within reach;with chair alarm set(no wall cord, RN aware) Nurse Communication: Mobility status;Need for lift equipment PT Visit Diagnosis: Other abnormalities of gait and mobility (R26.89);Muscle weakness (  generalized) (M62.81);Pain    Time: 2831-5176 PT Time Calculation (min) (ACUTE ONLY): 28 min   Charges:   PT Evaluation $PT Eval Moderate Complexity: 1 Mod     Ina Homes, PT, DPT Acute Rehabilitation Services  Pager [phone] Office [phone]  Malachy Chamber 10/22/2019, 11:48 AM

## 2019-10-22 NOTE — Progress Notes (Signed)
Patient suffers from COVID-19 resulting in generalized weakness, decreased activity tolerance and impaired cardiorespiratory status limiting her mobility and ability to perform ADL tasks independently. Patient requires a hospital bed to maximize functional mobility and positioning while at home. Pt will have assist from family to perform necessary functions with bed.   Mabeline Caras, PT, DPT Acute Rehabilitation Services  Pager [phone] Office [phone]

## 2019-10-22 NOTE — TOC Progression Note (Signed)
Transition of Care [facility]) - Progression Note    Patient Details  Name: Regina White MRN: [id number] Date of Birth: [birth year]  Transition of Care [facility]) CM/SW Contact  Joaquin Courts, RN Phone Number: 10/22/2019, 4:02 PM  Clinical Narrative:    CM spoke with spouse regarding dc planning. Spouse expresses that he will speak with his wife and daughter because they would ideally like for patient to return home with [facility] services. Spouse states he believes his daughter has a room set up for the patient and can help care for her. However, he wishes to speak with both of them before making final decision. CM did discuss that should they elect to return home CM can assist with arranging a [place] agency. Should they decide to go to SNF for additional rehab, CM can assist with that and can fax patient out to other area facilities.   1612: Cm received call from spouse stating they wish for patient to discharge to her daughters home with [facility] services. Spouse expresses they will need a hospital bed in the home. Patient already has RW, 3-in-1, and wheelchair.  Expected Discharge Plan: [place] Barriers to Discharge: Continued Medical Work up  Expected Discharge Plan and Services Expected Discharge Plan: [place] arrangements for the past 2 months: [place]                                       Social Determinants of Health (SDOH) Interventions    Readmission Risk Interventions Readmission Risk Prevention Plan 10/21/2019  Transportation Screening Complete  Palliative Care Screening Not Applicable  Medication Review (RN Care Manager) Complete  Some recent data might be hidden

## 2019-10-22 NOTE — Progress Notes (Addendum)
PROGRESS NOTE Regina White  FIE:[id number] DOB: August 24, 1956 DOA: 10/19/2019 PCP: Cain Sieve, MD   Brief Narrative: Regina White is a 63 y.o. female with a history of COPD, asthma, pulmonary HTN, HFrEF, recurrent DVT, hypotension on midodrine, C. 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 She has been rehabilitating at [facility] where she began feeling tired with cough about 1 week ago and subsequently tested positive for covid-19 on 12/5. Due to progressing dyspnea, she was taken to the ED 12/11 where she was tachypneic, hypoxic requiring 3L O2, with patchy infiltrates bilaterally, CRP elevated to 8.3. Remdesivir and steroids were given, and the patient admitted, subsequently transferred to [facility] 12/12 requiring 15L NRB. CRP also rising. Convalescent plasma ordered.  Assessment & Plan: Principal Problem:   COVID-19 virus infection Active Problems:   Hypertension, benign   Hypothyroidism   Pulmonary hypertension (HCC)   Hyponatremia  Acute hypoxic respiratory failure due to covid-19 pneumonia in patient with significant pulmonary and cardiac comorbidities including COPD, asthma, OSA, pulmonary HTN, HFrEF.  - Hypoxia has stabilized - CCP ordered 12/12.  - Continue remdesivir (12/11 - 12/15) - Continue steroids, return to standard dosing. CRP down today.  - Continue home singulair, dulera (for advair), atrovent and albuterol MDI's.   Severe sepsis: Leukopenia, tachypnea, hypothermia with AKI due to covid infection. Improving parameters. No further GDT warranted.  AKI: Suspected to be prerenal, possibly complicated by hypotension. No longer exhibits physiology of sepsis.  - Improved, continue to avoid nephrotoxins and monitor.  Recurrent DVT s/p thrombectomy and RUE intramuscular hematoma in setting of lovenox: Pt wishes to avoid lovenox.  - Continue apixaban 2.5mg  po BID  due to bleeding history.   Pulmonary HTN: RHC 2018 with PASP ~75. Thought to be overlap of Groups I, II, and III. - Follow up outpatient with Dr. Elza Rafter.   Chronic combined HFrEF: Appear volume down-to-euvolemic. Echo July 2019 w/EF 35-40%, G1DD. LVEF improved since that time based on echo Jan 2020.  - Does not appear to be on regular guideline-directed therapy, likely due to hypotension. Will not start at this time.   1st degree AV block: Noted on telemetry.  - Avoid negative chronotropes.  - PVCs occasionally with 2 beats NSVT also noted on my personal review today. Will continue telemetry monitoring x24 hrs.   Orthostatic hypotension:  - Continue midodrine  Hypothyroidism: Recent TSH at goal 0.567  - Continue synthroid   Nausea, GERD: Note normal EGD Feb 2020. 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 Roda Shutters. Last intra-articular steroid injection left hip 11/13. - Continue gabapentin, prn oxycodone - PT/OT, continue splint.  Obesity: BMI 35.  PAD: s/p left femoral vein angioplasty with iliac stenting, 06/26/19.  - Eliquis as above  RN Pressure Injury Documentation: Pressure Injury 08/05/19 Buttocks Right Stage II -  Partial thickness loss of dermis presenting as a shallow open ulcer with a red, pink wound bed without slough. quarter sized open wound, patient states area has been hurting since before she left the ho (Active)  08/05/19 1754  Location: Buttocks  Location Orientation: Right  Staging: Stage II -  Partial thickness loss of dermis presenting as a shallow open ulcer with a red, pink wound bed without slough.  Wound Description (Comments): quarter sized open wound, patient states area has been hurting since before she left the hospital on Thursday.  Patient states she was told by hospital staff at that time that she had a "blister" on her bottom.    Present on Admission:        Pressure Injury 08/05/19 Buttocks Left Stage II -  Partial thickness loss of dermis presenting as a shallow open ulcer with a red, pink wound bed without slough. quarter sized open wound, patient states area has been hurting since before she left the hos (Active)  08/05/19 1758  Location: Buttocks  Location Orientation: Left  Staging: Stage II -  Partial thickness loss of dermis presenting as a shallow open ulcer with a red, pink wound bed without slough.  Wound Description (Comments): quarter sized open wound, patient states area has been hurting since before she left the hospital on Thursday.  Patient states she was told by hospital staff at that time that she had a "blister" on her bottom.    Present on Admission:      Pressure Injury 08/05/19 Ankle Left Stage I -  Intact skin with non-blanchable redness of a localized area usually over a bony prominence. (Active)  08/05/19 1806  Location: Ankle  Location Orientation: Left  Staging: Stage I -  Intact skin with non-blanchable redness of a localized area usually over a bony prominence.  Wound Description (Comments):   Present on Admission:    DVT prophylaxis: Eliquis 2.5mg  po BID per hematology at previous hospitalization Code Status: Full Family Communication: Spoke with husband 12/13, will call again today. Disposition Plan: SNF once hypoxia improved and completes remdesivir.  Consultants:   None  Procedures:   None  Antimicrobials:  Remdesivir 12/11 - 12/15  Subjective: Continues to note improvement in shortness of breath. Pain is controlled. [facility].   Objective: Vitals:   10/22/19 0400 10/22/19 0415 10/22/19 0600 10/22/19 0800  BP: 116/74  125/81 125/80  Pulse: (!) 55 61 (!) 55 (!) 49  Resp:    (!) 23  Temp: 98.1 F (36.7 C)   97.8 F (36.6 C)  TempSrc: Oral   Oral  SpO2: 92% 94% 93% 96%  Weight:      Height:        Intake/Output Summary (Last 24 hours) at 10/22/2019  0955 Last data filed at 10/22/2019 0400 Gross per 24 hour  Intake 1114 ml  Output 1100 ml  Net 14 ml   Filed Weights   10/19/19 1720  Weight: 95.7 kg    Gen: 63 y.o. female in no distress Pulm: Nonlabored breathing supplemental oxygen. Crackles stable. CV: Regular rate and rhythm. No murmur, rub, or gallop. No JVD, trace dependent edema. GI: Abdomen soft, non-tender, non-distended, with normoactive bowel sounds.  Ext: Warm, RUE w/wrist flexion, diminished strength and sensation. Left great toe s/p amputation, healed. Skin: No new rashes, lesions or ulcers on visualized skin noted. Neuro: Alert and oriented. No new focal neurological deficits. Psych: Judgement and insight appear fair. Mood depressed & affect broad. Behavior is appropriate.     Data Reviewed: I have personally reviewed following labs and imaging studies  CBC: Recent Labs  Lab 10/19/19 1945 10/20/19 0546 10/21/19 0040  WBC 6.5 3.6* 5.5  NEUTROABS 5.2 3.3 4.9  HGB 15.3* 14.8 14.9  HCT 49.4* 47.5* 47.5*  MCV 83.0 84.1 83.9  PLT 186 172 204   Basic Metabolic Panel: Recent Labs  Lab 10/19/19 1945 10/20/19 0546 10/21/19 0040 10/22/19 0445  NA 133* 139 140 143  K 4.3 4.6 4.4 5.0  CL 102  103 106 109  CO2 20* 25 22 24   GLUCOSE 96 162* 187* 160*  BUN 14 16 34* 43*  CREATININE 0.79 1.07* 0.75 0.76  CALCIUM 8.4* 8.6* 8.7* 8.9  MG  --   --  1.9  --    GFR: Estimated Creatinine Clearance: 81.3 mL/min (by C-G formula based on SCr of 0.76 mg/dL). Liver Function Tests: Recent Labs  Lab 10/19/19 1945 10/20/19 0546 10/21/19 0040 10/22/19 0445  AST 26 23 21 22   ALT 23 22 20 22   ALKPHOS 64 61 58 54  BILITOT 1.1 0.9 0.5 0.8  PROT 6.1* 5.8* 6.2* 6.1*  ALBUMIN 3.4* 3.1* 3.2* 3.2*   No results for input(s): LIPASE, AMYLASE in the last 168 hours. No results for input(s): AMMONIA in the last 168 hours. Coagulation Profile: No results for input(s): INR, PROTIME in the last 168 hours. Cardiac Enzymes: No  results for input(s): CKTOTAL, CKMB, CKMBINDEX, TROPONINI in the last 168 hours. BNP (last 3 results) No results for input(s): PROBNP in the last 8760 hours. HbA1C: No results for input(s): HGBA1C in the last 72 hours. CBG: Recent Labs  Lab 10/20/19 0825  GLUCAP 148*   Lipid Profile: Recent Labs    10/19/19 1945  TRIG 113   Thyroid Function Tests: No results for input(s): TSH, T4TOTAL, FREET4, T3FREE, THYROIDAB in the last 72 hours. Anemia Panel: Recent Labs    10/19/19 1945 10/21/19 0040  FERRITIN 101 105   Urine analysis:    Component Value Date/Time   COLORURINE YELLOW (A) 08/06/2019 1647   APPEARANCEUR HAZY (A) 08/06/2019 1647   LABSPEC 1.014 08/06/2019 1647   PHURINE 6.0 08/06/2019 1647   GLUCOSEU NEGATIVE 08/06/2019 1647   HGBUR NEGATIVE 08/06/2019 1647   BILIRUBINUR NEGATIVE 08/06/2019 1647   KETONESUR NEGATIVE 08/06/2019 1647   PROTEINUR NEGATIVE 08/06/2019 1647   NITRITE NEGATIVE 08/06/2019 1647   LEUKOCYTESUR MODERATE (A) 08/06/2019 1647   Recent Results (from the past 240 hour(s))  Blood Culture (routine x 2)     Status: None (Preliminary result)   Collection Time: 10/19/19  7:46 PM   Specimen: BLOOD LEFT HAND  Result Value Ref Range Status   Specimen Description BLOOD LEFT HAND  Final   Special Requests   Final    BOTTLES DRAWN AEROBIC AND ANAEROBIC Blood Culture results may not be optimal due to an inadequate volume of blood received in culture bottles   Culture   Final    NO GROWTH 3 DAYS Performed at [facility], [place] [address], [place], [place] [address]    Report Status PENDING  Incomplete  Blood Culture (routine x 2)     Status: None (Preliminary result)   Collection Time: 10/19/19  8:59 PM   Specimen: BLOOD LEFT ARM  Result Value Ref Range Status   Specimen Description BLOOD LEFT ARM  Final   Special Requests   Final    BOTTLES DRAWN AEROBIC AND ANAEROBIC Blood Culture adequate volume   Culture   Final    NO GROWTH 3  DAYS Performed at [facility], [place] [address] [place], [place] [address]    Report Status PENDING  Incomplete      Radiology Studies: No results found.  Scheduled Meds: . acidophilus  2 capsule Oral TID AC  . apixaban  2.5 mg Oral BID  . cholecalciferol  2,000 Units Oral Daily  . cyclobenzaprine  5 mg Oral QHS  . dexamethasone (DECADRON) injection  6 mg Intravenous Q12H  .  famotidine  20 mg Oral BID  . feeding supplement (PRO-STAT SUGAR FREE 64)  30 mL Oral QPM  . gabapentin  300 mg Oral TID  . ipratropium  2 puff Inhalation Q6H  . levothyroxine  200 mcg Oral QAC breakfast  . midodrine  10 mg Oral TID WC  . mometasone-formoterol  2 puff Inhalation BID  . montelukast  10 mg Oral Daily  . multivitamin with minerals  1 tablet Oral Daily  . sodium chloride flush  3 mL Intravenous Q12H   Continuous Infusions: . sodium chloride    . remdesivir 100 mg in NS 100 mL 100 mg (10/21/19 1157)     LOS: 2 days   Time spent: 25 minutes.  Tyrone Nine, MD [facility] [email or web address] 10/22/2019, 9:55 AM

## 2019-10-22 NOTE — Evaluation (Addendum)
Occupational Therapy Evaluation Patient Details Name: Regina White MRN: [id number] DOB: 1956/05/25 Today's Date: 10/22/2019    History of Present Illness Pt is a 63 y.o. female admitted from [facility] SNF on 10/19/19 with fatigue and cough; pt tested (+) COVID-19 on 12/5. Pt with AKI and severe sepsis. 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   Clinical Impression   PTA, pt was at [facility] for rehab (since initial hospital admission 07/2019) and required assistance for BADLs and mobility with lift equipment. Pt currently requiring Mod-Max A for bathing, dressing, and toileting and Mod-Max A +2 stand pivot to recliner. Pt presenting with weakness, decreased functional use of RUE (with wrist in pre-fab splint), and poor activity tolerance. Despite fatigue and fear of falling, pt motivated to participate in therapy and perform OOB activity. Pt would benefit from further acute OT to facilitate safe dc. Recommend dc to SNF for further OT to optimize safety, independence with ADLs, and return to PLOF.      Follow Up Recommendations  SNF;Supervision/Assistance - 24 hour ; agreeable to SNF but does not want to return to [employer]  Other (comment)(Defer to next venue)    Recommendations for Other Services PT consult     Precautions / Restrictions Precautions Precautions: Fall;Other (comment) Precaution Comments: R radial nerve palsy (splint); L hip DJD very painful Restrictions Weight Bearing Restrictions: No      Mobility Bed Mobility Overal bed mobility: Needs Assistance Bed Mobility: Supine to Sit     Supine to sit: Max assist;+2 for physical assistance     General bed mobility comments: Assist to hook RLE under L foot to assist to EOB, cues to  reach with LUE, pt anxious about movement and pain; maxA+2 to assist trunk elevation and scoot hips to EOB  Transfers Overall transfer level: Needs assistance Equipment used: 2 person hand held assist Transfers: Sit to/from [facility] Sit to Stand: Mod assist;+2 physical assistance Stand pivot transfers: Mod assist;+2 physical assistance       General transfer comment: ModA+2 to elevate trunk, pt with difficulty moving LLE to take complete step, difficulty accepting weight through painful LLE in order to step with R foot; ultimately requiring modA+2 to pivot from bed to recliner    Balance Overall balance assessment: Needs assistance   Sitting balance-Leahy Scale: Poor Sitting balance - Comments: Initially maxA to maintain sitting balance with R-side lateral lean, able to progress to sitting with LUE support and min guard Postural control: Posterior lean;Right lateral lean   Standing balance-Leahy Scale: Poor                             ADL either performed or assessed with clinical judgement   ADL Overall ADL's : Needs assistance/impaired Eating/Feeding: Minimal assistance;Sitting Eating/Feeding Details (indicate cue type and reason): Min A for opening containers  Grooming: Minimal assistance;Sitting(Max support for sitting balance)   Upper Body Bathing: Moderate assistance;Sitting   Lower Body Bathing: Maximal assistance;+2 for physical assistance;Sit to/from stand;Bed level   Upper Body Dressing : Moderate assistance;Sitting   Lower Body Dressing: Maximal assistance;+2 for physical assistance;Sit to/from stand;Bed level   Toilet Transfer: Maximal assistance;Stand-pivot;+2 for physical assistance(simulated to recliner) Toilet Transfer Details (indicate cue type and reason): Max A +2 for  stand pivot with bilateral knees blocked         Functional mobility during ADLs: Maximal assistance;+2 for physical assistance(stand pivot only) General ADL  Comments: Pt presenting with poor balance, strength, and activity tolerance.      Vision         Perception     Praxis      Pertinent Vitals/Pain Pain Assessment: Faces Faces Pain Scale: Hurts even more Pain Location: RUE > L anterior thigh Pain Descriptors / Indicators: Grimacing;Guarding;Moaning Pain Intervention(s): Monitored during session;Limited activity within patient's tolerance;Repositioned     Hand Dominance Right   Extremity/Trunk Assessment Upper Extremity Assessment Upper Extremity Assessment: RUE deficits/detail RUE Deficits / Details: R wrist in splint s/p radial nerve palsy. Pt with minimal active wrist movement and composite flexion/extension. Pt able to perform finger opposition from thumb to index. Pt only tolerating PROM of wrist to neutral from short periods due to significant pain. Pt able to perform active elbow ROM to ~130. Limited shoulder ROM due to pain and fatigue. also noted bandage under splint due to pressure sore. RUE Coordination: decreased gross motor;decreased fine motor   Lower Extremity Assessment Lower Extremity Assessment: Defer to PT evaluation LLE Deficits / Details: Limited by L anterior thigh/hip pain; hip flex <3/5, knee ext 3/5 almost full ROM LLE: Unable to fully assess due to pain LLE Sensation: decreased light touch LLE Coordination: decreased gross motor   Cervical / Trunk Assessment Cervical / Trunk Assessment: Other exceptions   Communication Communication Communication: No difficulties   Cognition Arousal/Alertness: Awake/alert Behavior During Therapy: WFL for tasks assessed/performed;Anxious Overall Cognitive Status: Within Functional Limits for tasks assessed                                 General Comments: WFL for simple tasks, not formally assessed; anxious regarding movement, seems fearful of falling, but responded well to encouragement and redirection   General Comments  SpO2 >87% on 3L O2.      Exercises     Shoulder Instructions      Home Living Family/patient expects to be discharged to:: Skilled nursing facility Living Arrangements: Spouse/significant other Available Help at Discharge: Family Type of Home: House Home Access: Stairs to enter Entergy CorporationEntrance Stairs-Number of Steps: Bi-level: 6 steps with B railings (not able to reach both) plus 6 steps with B railings (able to reach both) to main level   Home Layout: (Bi-level--able to live on one level)     Bathroom Shower/Tub: Producer, television/film/videoWalk-in shower   Bathroom Toilet: Handicapped height     Home Equipment: Scientist, forensicWalker - standard;Walker - 4 wheels;Shower seat;Grab bars - toilet;Grab bars - tub/shower          Prior Functioning/Environment Level of Independence: Needs assistance  Gait / Transfers Assistance Needed: Per chart, was ambulatory with RW/rollator prior to initial hospitalizations in 07/2019; has been at SNF for rehab, use of standing frame for OOB with assist from staff ADL's / Homemaking Assistance Needed: Assist for ADLs, likely being performed bed-level            OT Problem List: Decreased strength;Decreased range of motion;Decreased activity tolerance;Impaired balance (sitting and/or standing);Decreased cognition;Decreased safety awareness;Decreased knowledge of use of DME or AE;Decreased knowledge of precautions;Impaired UE functional use;Pain      OT Treatment/Interventions: Self-care/ADL training;Therapeutic exercise;Energy conservation;DME and/or AE instruction;Therapeutic activities;Patient/family education    OT Goals(Current goals can be found in the care plan section) Acute Rehab OT  Goals Patient Stated Goal: Agreeable to continued rehab at SNF, but does not want to return to [facility] OT Goal Formulation: With patient Time For Goal Achievement: 11/05/19 Potential to Achieve Goals: Good  OT Frequency: Min 2X/week   Barriers to D/C:            Co-evaluation PT/OT/SLP Co-Evaluation/Treatment:  Yes Reason for Co-Treatment: For patient/therapist safety;To address functional/ADL transfers   OT goals addressed during session: ADL's and self-care      AM-PAC OT "6 Clicks" Daily Activity     Outcome Measure Help from another person eating meals?: A Little Help from another person taking care of personal grooming?: A Little Help from another person toileting, which includes using toliet, bedpan, or urinal?: A Lot Help from another person bathing (including washing, rinsing, drying)?: A Lot Help from another person to put on and taking off regular upper body clothing?: A Lot Help from another person to put on and taking off regular lower body clothing?: Total 6 Click Score: 13   End of Session Equipment Utilized During Treatment: Oxygen Nurse Communication: Mobility status  Activity Tolerance: Patient tolerated treatment well;Patient limited by fatigue;Patient limited by pain Patient left: in chair;with call bell/phone within reach  OT Visit Diagnosis: Unsteadiness on feet (R26.81);Other abnormalities of gait and mobility (R26.89);Muscle weakness (generalized) (M62.81);Pain Pain - Right/Left: Right Pain - part of body: Hand                Time: 7253-6644 OT Time Calculation (min): 36 min Charges:  OT General Charges $OT Visit: 1 Visit OT Evaluation $OT Eval Moderate Complexity: 1 Mod  Kerin Cecchi MSOT, OTR/L Acute Rehab Pager: [phone] Office: [phone]  Theodoro Grist Solan Vosler 10/22/2019, 3:15 PM

## 2019-10-23 DIAGNOSIS — L8990 Pressure ulcer of unspecified site, unspecified stage: Secondary | ICD-10-CM | POA: Insufficient documentation

## 2019-10-23 LAB — COMPREHENSIVE METABOLIC PANEL
ALT: 20 U/L (ref 0–44)
AST: 14 U/L — ABNORMAL LOW (ref 15–41)
Albumin: 3.2 g/dL — ABNORMAL LOW (ref 3.5–5.0)
Alkaline Phosphatase: 51 U/L (ref 38–126)
Anion gap: 8 (ref 5–15)
BUN: 47 mg/dL — ABNORMAL HIGH (ref 8–23)
CO2: 24 mmol/L (ref 22–32)
Calcium: 8.7 mg/dL — ABNORMAL LOW (ref 8.9–10.3)
Chloride: 107 mmol/L (ref 98–111)
Creatinine, Ser: 0.71 mg/dL (ref 0.44–1.00)
GFR calc Af Amer: >60 mL/min (ref >60)
GFR calc non Af Amer: >60 mL/min (ref >60)
Glucose, Bld: 143 mg/dL — ABNORMAL HIGH (ref 70–99)
Potassium: 4.5 mmol/L (ref 3.5–5.1)
Sodium: 139 mmol/L (ref 135–145)
Total Bilirubin: 0.6 mg/dL (ref 0.3–1.2)
Total Protein: 5.7 g/dL — ABNORMAL LOW (ref 6.5–8.1)

## 2019-10-23 LAB — C-REACTIVE PROTEIN: CRP: 2.1 mg/dL — ABNORMAL HIGH (ref <1.0)

## 2019-10-23 MED ORDER — GUAIFENESIN-DM 100-10 MG/5ML PO SYRP
5.0000 mL | ORAL_SOLUTION | ORAL | Status: DC | PRN
  Administered 2019-10-23 – 2019-10-24 (×2): 5 mL via ORAL
  Filled 2019-10-23 (×2): qty 10

## 2019-10-23 MED ORDER — HYDROCOD POLST-CPM POLST ER 10-8 MG/5ML PO SUER
5.0000 mL | Freq: Two times a day (BID) | ORAL | Status: DC | PRN
  Administered 2019-10-24: 5 mL via ORAL
  Filled 2019-10-23: qty 5

## 2019-10-23 NOTE — TOC Progression Note (Addendum)
Transition of Care [facility]) - Progression Note    Patient Details  Name: ADRIEN DIETZMAN MRN: [id number] Date of Birth: 1956/05/18  [facility] of [facility]) CM/SW Contact  Joaquin Courts, RN Phone Number: 10/23/2019, 2:46 PM  Clinical Narrative:    Patient set up with HHPT, OT, RN, and aide with [facility], rep cory given referral. [employer] to arrange delivery of hospital bed and hoyer lift to home. Of note patient plans to dc to daughter's home at [facility] in [place]. Daughter is working until 530 pm but has reported she can accept equipment delivery after work, [employer] was made aware of this.   Patient inquired about standing pole or standing frame. [employer] reports the equipment is available for purchase in the [employer] store located on [address] in [place]. This equipment is not covered by insurance and will be an out of pocket cost ranging from 179-250$.  CM informed patient of this fact. Patient reports she will communicate this to her spouse and if she still wishes for the equipment she will have spouse go to the store to purchase.   Expected Discharge Plan: Skilled Nursing Facility Barriers to Discharge: Continued Medical Work up  Expected Discharge Plan and Services Expected Discharge Plan: [place] Choice: [place] arrangements for the past 2 months: [place]                 DME Arranged: Hospital bed(hoyer lift) DME Agency: [employer] Date DME Agency Contacted: 10/23/19 Time DME Agency Contacted: [phone] Representative spoke with at DME Agency: [place]: PT, OT, RN, Nurse's Aide [place] Agency: [place] Date [place]: 10/23/19 Time [place]: 1446 Representative spoke with at [place]: [place]) Interventions    Readmission Risk Interventions Readmission Risk Prevention Plan 10/21/2019  Transportation Screening Complete   Palliative Care Screening Not Applicable  Medication Review (RN Care Manager) Complete  Some recent data might be hidden

## 2019-10-23 NOTE — Progress Notes (Signed)
PROGRESS NOTE Regina BareCathleen N Sheek  ZOX:[id number] DOB: 1956/01/29 DOA: 10/19/2019 PCP: Cain SieveKlipstein, Christopher, MD   Brief Narrative: Regina White is a 63 y.o. female with a history of COPD, asthma, pulmonary HTN, HFrEF, recurrent DVT, hypotension on midodrine, C. 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 She has been rehabil[facility] where she began feeling tired with cough about 1 week ago and subsequently tested positive for covid-19 on 12/5. Due to progressing dyspnea, she was taken to the ED 12/11 where she was tachypneic, hypoxic requiring 3L O2, with patchy infiltrates bilaterally, CRP elevated to 8.3. Remdesivir and steroids were given, and the patient admitted, [facility] requiring 15L NRB. CRP also rising. Convalescent plasma ordered. With treatment, pulmonary status has improved though she remains very weak. She will require significant home health and DME including hoyer lift and hospital bed at discharge.   Assessment & Plan: Principal Problem:   COVID-19 virus infection Active Problems:   Hypertension, benign   Hypothyroidism   Pulmonary hypertension (HCC)   Hyponatremia   Pressure injury of skin  Acute hypoxic respiratory failure due to covid-19 pneumonia in patient with significant pulmonary and cardiac comorbidities including COPD, asthma, OSA, pulmonary HTN, HFrEF.  - Hypoxia has stabilized, continue to wean as able - CCP ordered 12/12.  - Complete remdesivir today (12/11 - 12/15) - Continue steroids, return to standard dosing. CRP down today.  - Continue home singulair, dulera (for advair), atrovent and albuterol MDI's.   Severe sepsis: Leukopenia, tachypnea, hypothermia with AKI and respiratory failure due to covid infection. Improving parameters. No further GDT warranted.  AKI: Suspected to be prerenal, possibly complicated by  hypotension. No longer exhibits physiology of sepsis.  - Improved, continue to avoid nephrotoxins and monitor.  Recurrent DVT s/p thrombectomy and RUE intramuscular hematoma in setting of lovenox: Pt wishes to avoid lovenox.  - Continue apixaban 2.5mg  po BID due to bleeding history.   Pulmonary HTN: RHC 2018 with PASP ~75. Thought to be overlap of Groups I, II, and III. - Follow up outpatient with Dr. Elza Rafterose-Jones.   Chronic combined HFrEF: Appear volume down-to-euvolemic. Echo July 2019 w/EF 35-40%, G1DD. LVEF improved since that time based on echo Jan 2020.  - Does not appear to be on regular guideline-directed therapy, likely due to hypotension. Will not start at this time.   1st degree AV block: Noted on telemetry.  - Avoid negative chronotropes.  - PVCs occasionally with 2 beats NSVT also noted on my personal review today. Will continue telemetry monitoring x24 hrs.   Orthostatic hypotension:  - Continue midodrine  Hypothyroidism: Recent TSH at goal 0.567  - Continue synthroid   Nausea, GERD: Note normal EGD Feb 2020. 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 Roda ShuttersXu. Last intra-articular steroid injection left hip 11/13. - Continue gabapentin, prn oxycodone - PT/OT, continue splint.  Obesity: BMI 35.  PAD: s/p left femoral vein angioplasty with iliac stenting, 06/26/19.  - Eliquis as above  RN Pressure Injury Documentation: Pressure Injury 08/05/19 Buttocks Left Stage II -  Partial thickness loss of dermis presenting as a shallow open ulcer with a red, pink wound bed without slough. quarter sized open wound, patient states area has been hurting since before she left the hos (Active)  08/05/19 1758  Location: Buttocks  Location Orientation: Left  Staging: Stage II -  Partial thickness loss of dermis presenting as a shallow open ulcer with a red, pink wound bed without slough.  Wound  Description (Comments): quarter sized open wound, patient states area has been hurting since before she left the hospital on Thursday.  Patient states she was told by hospital staff at that time that she had a "blister" on her bottom.    Present on Admission:      Pressure Injury 08/05/19 Ankle Left Stage I -  Intact skin with non-blanchable redness of a localized area usually over a bony prominence. (Active)  08/05/19 1806  Location: Ankle  Location Orientation: Left  Staging: Stage I -  Intact skin with non-blanchable redness of a localized area usually over a bony prominence.  Wound Description (Comments):   Present on Admission:      Pressure Injury 10/22/19 Sacrum Medial Stage II -  Partial thickness loss of dermis presenting as a shallow open ulcer with a red, pink wound bed without slough. (Active)  10/22/19 1957  Location: Sacrum  Location Orientation: Medial  Staging: Stage II -  Partial thickness loss of dermis presenting as a shallow open ulcer with a red, pink wound bed without slough.  Wound Description (Comments):   Present on Admission:    DVT prophylaxis: Eliquis 2.5mg  po BID per hematology at previous hospitalization Code Status: Full Family Communication: Husband updated daily Disposition Plan: SNF initially recommended, declined by patient. Will discharge to daughter's home where she will have significant family assistance, maximized home health and DME as well. Will hope to liberate from oxygen prior to discharge, possibly 12/16.   Consultants:   None  Procedures:   None  Antimicrobials:  Remdesivir 12/11 - 12/15  Subjective: Has hacking cough but no shortness of breath at rest. Denies chest pain. No bleeding reported.   Objective: Vitals:   10/22/19 2011 10/23/19 0056 10/23/19 0400 10/23/19 0753  BP: 132/74 128/71 132/89 (!) 136/92  Pulse: 79 (!) 56 67 (!) 55  Resp: (!) 22 18 (!) 22 19  Temp: 97.7 F (36.5 C) (!) 97.5 F (36.4 C) (!) 97.5 F (36.4  C) 97.6 F (36.4 C)  TempSrc: Oral Oral Oral Axillary  SpO2: 92% 93% 94% 92%  Weight:      Height:        Intake/Output Summary (Last 24 hours) at 10/23/2019 1400 Last data filed at 10/22/2019 2217 Gross per 24 hour  Intake 3 ml  Output --  Net 3 ml   Filed Weights   10/19/19 1720  Weight: 95.7 kg   Gen: 63 y.o. female in no distress Pulm: Nonlabored 2LPM, crackles improved. CV: Regular rate and rhythm. No murmur, rub, or gallop. No JVD, trace dependent edema. GI: Abdomen soft, non-tender, non-distended, with normoactive bowel sounds.  Ext: Warm, RUE contracture, limited strength stable. Skin: No new rashes, lesions or ulcers on visualized skin. Neuro: Alert and oriented. No new focal neurological deficits. Psych: Judgement and insight appear fair. Mood euthymic & affect congruent. Behavior is appropriate.    Data Reviewed: I have personally reviewed following labs and imaging studies  CBC: Recent Labs  Lab 10/19/19 1945 10/20/19 0546 10/21/19 0040  WBC 6.5 3.6* 5.5  NEUTROABS 5.2 3.3 4.9  HGB 15.3* 14.8 14.9  HCT 49.4* 47.5* 47.5*  MCV 83.0 84.1 83.9  PLT 186 172 732   Basic Metabolic Panel: Recent Labs  Lab 10/19/19 1945 10/20/19 0546 10/21/19 0040 10/22/19 0445 10/23/19 0405  NA 133* 139 140 143 139  K 4.3  4.6 4.4 5.0 4.5  CL 102 103 106 109 107  CO2 20* 25 22 24 24   GLUCOSE 96 162* 187* 160* 143*  BUN 14 16 34* 43* 47*  CREATININE 0.79 1.07* 0.75 0.76 0.71  CALCIUM 8.4* 8.6* 8.7* 8.9 8.7*  MG  --   --  1.9  --   --    GFR: Estimated Creatinine Clearance: 81.3 mL/min (by C-G formula based on SCr of 0.71 mg/dL). Liver Function Tests: Recent Labs  Lab 10/19/19 1945 10/20/19 0546 10/21/19 0040 10/22/19 0445 10/23/19 0405  AST 26 23 21 22  14*  ALT 23 22 20 22 20   ALKPHOS 64 61 58 54 51  BILITOT 1.1 0.9 0.5 0.8 0.6  PROT 6.1* 5.8* 6.2* 6.1* 5.7*  ALBUMIN 3.4* 3.1* 3.2* 3.2* 3.2*   No results for input(s): LIPASE, AMYLASE in the last 168  hours. No results for input(s): AMMONIA in the last 168 hours. Coagulation Profile: No results for input(s): INR, PROTIME in the last 168 hours. Cardiac Enzymes: No results for input(s): CKTOTAL, CKMB, CKMBINDEX, TROPONINI in the last 168 hours. BNP (last 3 results) No results for input(s): PROBNP in the last 8760 hours. HbA1C: No results for input(s): HGBA1C in the last 72 hours. CBG: Recent Labs  Lab 10/20/19 0825  GLUCAP 148*   Lipid Profile: No results for input(s): CHOL, HDL, LDLCALC, TRIG, CHOLHDL, LDLDIRECT in the last 72 hours. Thyroid Function Tests: No results for input(s): TSH, T4TOTAL, FREET4, T3FREE, THYROIDAB in the last 72 hours. Anemia Panel: Recent Labs    10/21/19 0040  FERRITIN 105   Urine analysis:    Component Value Date/Time   COLORURINE YELLOW (A) 08/06/2019 1647   APPEARANCEUR HAZY (A) 08/06/2019 1647   LABSPEC 1.014 08/06/2019 1647   PHURINE 6.0 08/06/2019 1647   GLUCOSEU NEGATIVE 08/06/2019 1647   HGBUR NEGATIVE 08/06/2019 1647   BILIRUBINUR NEGATIVE 08/06/2019 1647   KETONESUR NEGATIVE 08/06/2019 1647   PROTEINUR NEGATIVE 08/06/2019 1647   NITRITE NEGATIVE 08/06/2019 1647   LEUKOCYTESUR MODERATE (A) 08/06/2019 1647   Recent Results (from the past 240 hour(s))  Blood Culture (routine x 2)     Status: None (Preliminary result)   Collection Time: 10/19/19  7:46 PM   Specimen: BLOOD LEFT HAND  Result Value Ref Range Status   Specimen Description BLOOD LEFT HAND  Final   Special Requests   Final    BOTTLES DRAWN AEROBIC AND ANAEROBIC Blood Culture results may not be optimal due to an inadequate volume of blood received in culture bottles   Culture   Final    NO GROWTH 3 DAYS Performed at [facility], [address] [place], [facility] [address]    Report Status PENDING  Incomplete  Blood Culture (routine x 2)     Status: None (Preliminary result)   Collection Time: 10/19/19  8:59 PM   Specimen: BLOOD LEFT ARM  Result Value Ref Range  Status   Specimen Description BLOOD LEFT ARM  Final   Special Requests   Final    BOTTLES DRAWN AEROBIC AND ANAEROBIC Blood Culture adequate volume   Culture   Final    NO GROWTH 3 DAYS Performed at [facility], [address], [place], [facility] [address]    Report Status PENDING  Incomplete      Radiology Studies: No results found.  Scheduled Meds: . acidophilus  2 capsule Oral TID AC  . apixaban  2.5 mg Oral BID  . cholecalciferol  2,000 Units Oral Daily  . cyclobenzaprine  5 mg Oral QHS  . dexamethasone (DECADRON) injection  6 mg Intravenous Q24H  . famotidine  20 mg Oral BID  . feeding supplement (PRO-STAT SUGAR FREE 64)  30 mL Oral QPM  . gabapentin  300 mg Oral TID  . ipratropium  2 puff Inhalation Q6H  . levothyroxine  200 mcg Oral QAC breakfast  . midodrine  10 mg Oral TID WC  . mometasone-formoterol  2 puff Inhalation BID  . montelukast  10 mg Oral Daily  . multivitamin with minerals  1 tablet Oral Daily  . sodium chloride flush  3 mL Intravenous Q12H   Continuous Infusions: . sodium chloride       LOS: 3 days   Time spent: 25 minutes.  Tyrone Nine, MD [facility] [email or web address] 10/23/2019, 2:00 PM

## 2019-10-23 NOTE — Progress Notes (Signed)
Occupational Therapy Treatment Patient Details Name: Regina White MRN: [id number] DOB: 07-20-1956 Today's Date: 10/23/2019    History of present illness Pt is a 63 y.o. female admitted from [facility] on 10/19/19 with fatigue and cough; pt tested (+) COVID-19 on 12/5. Pt with AKI and severe sepsis. 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   OT comments  Pt seen to further assess R hand/wrist contracture. Will call vendor in am regarding available orthotics and establish a plan of care. Recommend HHOT  - pt needs aggressive management of R hand with splinting and use of therapeutic activities/modalities in addition to follow up with a hand specialist to improve functional use R hand.   Follow Up Recommendations  Home health OT;Supervision/Assistance - 24 hour    Equipment Recommendations  3 in 1 bedside commode;Wheelchair (measurements OT);Wheelchair cushion (measurements OT);Hospital bed;Other (comment)(hoyer)    Recommendations for Other Services      Precautions / Restrictions Precautions Precautions: Fall;Other (comment) Precaution Comments: R radial nerve palsy (splint); L hip DJD very painful       Mobility Bed Mobility Overal bed mobility: Needs Assistance Bed Mobility: Rolling Rolling: Max assist         General bed mobility comments: heavy use of rail  Transfers                 General transfer comment: NA    Balance                                           ADL either performed or assessed with clinical judgement   ADL Overall ADL's : Needs assistance/impaired Eating/Feeding: Set up Eating/Feeding Details (indicate cue type and reason): issued red tubing to use with R hand                         Toileting- Clothing  Manipulation and Hygiene: Maximal assistance Toileting - Clothing Manipulation Details (indicate cue type and reason): Pt's [place] not connected once transferred to room. Assisted nurse to clean pt. Pt assisted with rolling with Max A             Vision       Perception     Praxis      Cognition Arousal/Alertness: Awake/alert Behavior During Therapy: WFL for tasks assessed/performed;Anxious Overall Cognitive Status: Within Functional Limits for tasks assessed                                          Exercises Exercises: Other exercises Other Exercises Other Exercises: encouraged use of incentive spirometer and flutter valve   Shoulder Instructions       General Comments      Pertinent Vitals/ Pain       Pain Assessment: Faces Faces Pain Scale: Hurts even more Pain Location: L hip Pain Descriptors / Indicators: Grimacing;Guarding Pain Intervention(s): Limited activity within patient's tolerance  Home Living  Prior Functioning/Environment              Frequency  Min 2X/week        Progress Toward Goals  OT Goals(current goals can now be found in the care plan section)  Progress towards OT goals: Progressing toward goals  Acute Rehab OT Goals Patient Stated Goal: To go home OT Goal Formulation: With patient Time For Goal Achievement: 11/05/19 Potential to Achieve Goals: Good ADL Goals Pt Will Perform Grooming: with set-up;with supervision;sitting;with adaptive equipment Pt Will Perform Upper Body Dressing: with min guard assist;sitting Pt Will Perform Lower Body Dressing: with mod assist;sit to/from stand Pt Will Transfer to Toilet: with min assist;stand pivot transfer;bedside commode Additional ADL Goal #1: Pt will perform bed mobility with Min A in preparation for ADLs  Plan Discharge plan needs to be updated    Co-evaluation                 AM-PAC OT "6  Clicks" Daily Activity     Outcome Measure   Help from another person eating meals?: A Little Help from another person taking care of personal grooming?: A Little Help from another person toileting, which includes using toliet, bedpan, or urinal?: A Lot Help from another person bathing (including washing, rinsing, drying)?: A Lot Help from another person to put on and taking off regular upper body clothing?: A Lot Help from another person to put on and taking off regular lower body clothing?: Total 6 Click Score: 13    End of Session Equipment Utilized During Treatment: Oxygen(2L)  OT Visit Diagnosis: Unsteadiness on feet (R26.81);Other abnormalities of gait and mobility (R26.89);Muscle weakness (generalized) (M62.81);Pain Pain - Right/Left: Right Pain - part of body: Hand   Activity Tolerance Patient tolerated treatment well;Patient limited by fatigue;Patient limited by pain   Patient Left in bed;with call bell/phone within reach;Other (comment)(chair position)   Nurse Communication Mobility status;Other (comment)(R hand positioning)        Time: 1730-1755 OT Time Calculation (min): 25 min  Charges: OT General Charges $OT Visit: 1 Visit OT Treatments $Self Care/Home Management : 8-22 mins $Therapeutic Activity: 8-22 mins  Luisa Dago, OT/L   Acute OT Clinical Specialist Acute Rehabilitation Services Pager [phone] Office [phone]    [facility] 10/23/2019, 6:24 PM

## 2019-10-24 LAB — COMPREHENSIVE METABOLIC PANEL
ALT: 19 U/L (ref 0–44)
AST: 12 U/L — ABNORMAL LOW (ref 15–41)
Albumin: 3.3 g/dL — ABNORMAL LOW (ref 3.5–5.0)
Alkaline Phosphatase: 52 U/L (ref 38–126)
Anion gap: 9 (ref 5–15)
BUN: 46 mg/dL — ABNORMAL HIGH (ref 8–23)
CO2: 24 mmol/L (ref 22–32)
Calcium: 8.8 mg/dL — ABNORMAL LOW (ref 8.9–10.3)
Chloride: 107 mmol/L (ref 98–111)
Creatinine, Ser: 0.82 mg/dL (ref 0.44–1.00)
GFR calc Af Amer: >60 mL/min (ref >60)
GFR calc non Af Amer: >60 mL/min (ref >60)
Glucose, Bld: 150 mg/dL — ABNORMAL HIGH (ref 70–99)
Potassium: 4.6 mmol/L (ref 3.5–5.1)
Sodium: 140 mmol/L (ref 135–145)
Total Bilirubin: 0.6 mg/dL (ref 0.3–1.2)
Total Protein: 5.8 g/dL — ABNORMAL LOW (ref 6.5–8.1)

## 2019-10-24 LAB — CULTURE, BLOOD (ROUTINE X 2)
Culture: NO GROWTH
Culture: NO GROWTH
Special Requests: ADEQUATE

## 2019-10-24 LAB — C-REACTIVE PROTEIN: CRP: 1.3 mg/dL — ABNORMAL HIGH (ref <1.0)

## 2019-10-24 NOTE — Progress Notes (Signed)
OT Treatment Note    10/24/19 1319  OT Visit Information  Last OT Received On 10/24/19  Assistance Needed +2  History of Present Illness Pt is a 63 y.o. female admitted from [facility] on 10/19/19 with fatigue and cough; pt tested (+) COVID-19 on 12/5. Pt with AKI and severe sepsis. 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  Precautions  Precautions Fall;Other (comment)  Precaution Comments R radial nerve palsy (splint); L hip DJD very painful  Pain Assessment  Pain Assessment Faces  Faces Pain Scale 6  Pain Location R hand/wrist with ROM  Pain Descriptors / Indicators Grimacing;Guarding  Pain Intervention(s) Limited activity within patient's tolerance;Repositioned  Cognition  Arousal/Alertness Awake/alert  Behavior During Therapy WFL for tasks assessed/performed;Anxious  Overall Cognitive Status Within Functional Limits for tasks assessed  General Comments  General comments (skin integrity, edema, etc.) increase in IP and MP ROM with tips of digits @ 1 inch from palm as compared to tips @ 2 in from palm prior to sustained stretch. Pt fitted with wrist cock up splint and educated on donning and wearing scheudle - to be worn during the day but removed every 2 hours for skin checks. When splint is removed, she should complete the HEP program that was designed. Medbridge code given to pt to access online.   Other Exercises  Other Exercises Medbridge HEP reviewed  OT - End of Session  Equipment Utilized During Treatment Oxygen  Patient left in bed;with call bell/phone within reach  Nurse Communication Other (comment) (splinting needs)  OT Assessment/Plan  OT Plan Discharge plan remains appropriate;Frequency remains appropriate  OT Visit Diagnosis Unsteadiness on feet (R26.81);Other  abnormalities of gait and mobility (R26.89);Muscle weakness (generalized) (M62.81);Pain  Pain - Right/Left Right  Pain - part of body Hand  OT Frequency (ACUTE ONLY) Min 3X/week  Follow Up Recommendations Home health OT;Supervision/Assistance - 24 hour  OT Equipment 3 in 1 bedside commode;Wheelchair (measurements OT);Wheelchair cushion (measurements OT);Hospital bed  AM-PAC OT "6 Clicks" Daily Activity Outcome Measure (Version 2)  Help from another person eating meals? 3  Help from another person taking care of personal grooming? 3  Help from another person toileting, which includes using toliet, bedpan, or urinal? 2  Help from another person bathing (including washing, rinsing, drying)? 2  Help from another person to put on and taking off regular upper body clothing? 2  Help from another person to put on and taking off regular lower body clothing? 1  6 Click Score 13  OT Goal Progression  Progress towards OT goals Progressing toward goals  Acute Rehab OT Goals  Patient Stated Goal to be able to use my hand again  OT Goal Formulation With patient  Time For Goal Achievement 11/05/19  Potential to Achieve Goals Good  ADL Goals  Pt Will Perform Grooming with set-up;with supervision;sitting;with adaptive equipment  Pt Will Perform Upper Body Dressing with min guard assist;sitting  Pt Will Perform Lower Body Dressing with mod assist;sit to/from stand  Pt Will Transfer to Toilet with min assist;stand pivot transfer;bedside commode  Additional ADL Goal #1 Pt will perform bed mobility with Min A in preparation for ADLs  Additional ADL Goal #2 Pt will independently verbalize understanding regarding wearing time and precuations with splint wear  Additional ADL Goal #3 Pt will berbalize understanding of HEP for R hand/wrist  to increase funcitonal use of hand  OT Time Calculation  OT Start Time (ACUTE ONLY) 1150  OT Stop Time (ACUTE ONLY) 1225  OT Time Calculation (min) 35 min  OT General  Charges  $OT Visit 1 Visit  OT Treatments  $Therapeutic Activity 8-22 mins  $Therapeutic Exercise 8-22 mins  Maurie Boettcher, OT/L   Acute OT Clinical Specialist [place] Pager [phone] Office [phone]

## 2019-10-24 NOTE — Progress Notes (Signed)
OT Treatment Note  Pt seen for splint check. Wrist cock-up splint removed. No reddened areas noted. Pt completed stretches for wrist and finger extension. Resting hand splint donned. Educated NT on splint wear schedule. Wrist cock-up splint and resting hand splint to be alternated every 2 hours during the day. Pt to wear resting hand splint during the night. Skin checks should be performed when changing splints. If any concerns, please remove splint and contact OT. Will follow up in am and educate patient's husband over the phone in the am.  Pt able to log on successfully with her phone to her [place] program.     10/24/19 1700  OT Visit Information  Last OT Received On 10/24/19  Assistance Needed +2  History of Present Illness Pt is a 63 y.o. female admitted from [facility] on 10/19/19 with fatigue and cough; pt tested (+) COVID-19 on 12/5. Pt with AKI and severe sepsis. 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  Precautions  Precautions Fall;Other (comment)  Precaution Comments R radial nerve palsy (splint); L hip DJD very painful  Pain Assessment  Pain Assessment Faces  Faces Pain Scale 4  Pain Location R hand/wrist with ROM  Pain Descriptors / Indicators Grimacing;Guarding  Pain Intervention(s) Limited activity within patient's tolerance  Cognition  Arousal/Alertness Awake/alert  Behavior During Therapy WFL for tasks assessed/performed;Anxious  Overall Cognitive Status Within Functional Limits for tasks assessed  General Comments  General comments (skin integrity, edema, etc.) Pt seen for splint check. No redness noted. REviewed splint wearing schedule with pt and NT.  OT - End of Session  Equipment Utilized During Treatment Oxygen  Patient left in bed;with call bell/phone within  reach  Nurse Communication Other (comment) (splint care)  OT Assessment/Plan  OT Plan Discharge plan remains appropriate;Frequency remains appropriate  OT Visit Diagnosis Unsteadiness on feet (R26.81);Other abnormalities of gait and mobility (R26.89);Muscle weakness (generalized) (M62.81);Pain  Pain - Right/Left Right  Pain - part of body Hand  OT Frequency (ACUTE ONLY) Min 3X/week  Follow Up Recommendations Home health OT;Supervision/Assistance - 24 hour  OT Equipment 3 in 1 bedside commode;Wheelchair (measurements OT);Wheelchair cushion (measurements OT);Hospital bed  AM-PAC OT "6 Clicks" Daily Activity Outcome Measure (Version 2)  Help from another person eating meals? 3  Help from another person taking care of personal grooming? 3  Help from another person toileting, which includes using toliet, bedpan, or urinal? 2  Help from another person bathing (including washing, rinsing, drying)? 2  Help from another person to put on and taking off regular upper body clothing? 2  Help from another person to put on and taking off regular lower body clothing? 1  6 Click Score 13  OT Goal Progression  Progress towards OT goals Progressing toward goals  Acute Rehab OT Goals  Patient Stated Goal to be able to use my hand again  OT Goal Formulation With patient  Time For Goal Achievement 11/05/19  Potential to Achieve Goals Good  ADL Goals  Pt Will Perform Grooming with set-up;with supervision;sitting;with adaptive equipment  Pt Will Perform Upper Body Dressing with min guard assist;sitting  Pt Will Perform Lower Body Dressing with mod assist;sit to/from stand  Pt Will Transfer to Toilet with min assist;stand pivot transfer;bedside commode  Additional ADL Goal #1 Pt will perform bed mobility with Min A in preparation for  ADLs  Additional ADL Goal #2 Pt will independently verbalize understanding regarding wearing time and precuations with splint wear  Additional ADL Goal #3 Pt will berbalize  understanding of HEP for R hand/wrist to increase funcitonal use of hand  OT Time Calculation  OT Start Time (ACUTE ONLY) 1350  OT Stop Time (ACUTE ONLY) 1405  OT Time Calculation (min) 15 min  OT General Charges  $OT Visit 1 Visit  OT Treatments  $Orthotics/Prosthetics Check 8-22 mins  Luisa Dago, OT/L   Acute OT Clinical Specialist Acute Rehabilitation Services Pager [phone] Office [phone]

## 2019-10-24 NOTE — Care Management (Signed)
Patient's daughter called complaining DME had not be delivered last night, Case manager spoke with Bethanne Ginger, [employer] liaison concerning delivery of DME. Per Zack patient's daughter did not respond to calls from [employer] to confirm delivery on 10/23/19. Patient's daughter has insisted that no one can be at the home until 9pm or after tonight and she will be home in the morning 10/25/19. Edwyna Ready will arrange for delivery at 11am on tomorrow.    Ricki Miller, RN BSN Case Manager [phone]

## 2019-10-24 NOTE — Progress Notes (Signed)
Occupational Therapy Treatment Patient Details Name: Regina White MRN: [id number] DOB: July 29, 1956 Today's Date: 10/24/2019    History of present illness Pt is a 63 y.o. female admitted from [facility] on 10/19/19 with fatigue and cough; pt tested (+) COVID-19 on 12/5. Pt with AKI and severe sepsis. 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   OT comments  Pt fit with a R wrist cock-up splint to increase functional position of wrist and a R resting hand splint to use during the day as needed to provide a static stretch in the neutral position and for use during night time wear. Pt wrapped in composite flexion to increase functional ROM IP and MP joints. Educated on passive sustained stretch for R wrist. Will return to further assess response to sustained stretch and further address splinting. Pt verbalized understanding. Communicated with nsg.   Follow Up Recommendations  Home health OT;Supervision/Assistance - 24 hour    Equipment Recommendations  3 in 1 bedside commode;Wheelchair (measurements OT);Wheelchair cushion (measurements OT);Hospital bed    Recommendations for Other Services      Precautions / Restrictions Precautions Precautions: Fall;Other (comment) Precaution Comments: R radial nerve palsy (splint); L hip DJD very painful       Mobility Bed Mobility               General bed mobility comments: Pt able to help scoot up in bed with reverse trendelenberg adn use of hand rails and pushing with RLE  Transfers                      Balance                                           ADL either performed or assessed with clinical judgement   ADL     Eating/Feeding Details (indicate cue type and reason): encouraged use of R hand to feed  self using wrist cock up splint and built up tubing                                         Vision       Perception     Praxis      Cognition Arousal/Alertness: Awake/alert Behavior During Therapy: WFL for tasks assessed/performed;Anxious Overall Cognitive Status: Within Functional Limits for tasks assessed                                          Exercises Other Exercises Other Exercises: gross composite flexion using ace wrap to sustain stretch x 25 min Other Exercises: prayer stretch Other Exercises: sustained passive stretch in wrist extension after gentle wrist mobilization   Shoulder Instructions       General Comments fitted with R wrist cock-up splint and resting hand splint. Pt instructed on purpose and wearing schedule of splints    Pertinent Vitals/ Pain       Pain Assessment: Faces Faces Pain Scale: Hurts even more Pain Location: R hand/wrist with ROM  Home Living  Prior Functioning/Environment              Frequency  Min 3X/week        Progress Toward Goals  OT Goals(current goals can now be found in the care plan section)  Progress towards OT goals: Progressing toward goals(new goals established)  Acute Rehab OT Goals Patient Stated Goal: to be able to use my hand again OT Goal Formulation: With patient Time For Goal Achievement: 11/05/19 Potential to Achieve Goals: Good ADL Goals Pt Will Perform Grooming: with set-up;with supervision;sitting;with adaptive equipment Pt Will Perform Upper Body Dressing: with min guard assist;sitting Pt Will Perform Lower Body Dressing: with mod assist;sit to/from stand Pt Will Transfer to Toilet: with min assist;stand pivot transfer;bedside commode Additional ADL Goal #1: Pt will perform bed mobility with Min A in preparation for ADLs Additional ADL Goal #2: Pt will independently verbalize understanding regarding  wearing time and precuations with splint wear Additional ADL Goal #3: Pt will berbalize understanding of HEP for R hand/wrist to increase funcitonal use of hand  Plan Discharge plan remains appropriate;Frequency needs to be updated    Co-evaluation                 AM-PAC OT "6 Clicks" Daily Activity     Outcome Measure   Help from another person eating meals?: A Little Help from another person taking care of personal grooming?: A Little Help from another person toileting, which includes using toliet, bedpan, or urinal?: A Lot Help from another person bathing (including washing, rinsing, drying)?: A Lot Help from another person to put on and taking off regular upper body clothing?: A Lot Help from another person to put on and taking off regular lower body clothing?: Total 6 Click Score: 13    End of Session Equipment Utilized During Treatment: Oxygen  OT Visit Diagnosis: Unsteadiness on feet (R26.81);Other abnormalities of gait and mobility (R26.89);Muscle weakness (generalized) (M62.81);Pain Pain - Right/Left: Right Pain - part of body: Hand   Activity Tolerance     Patient Left in bed;with call bell/phone within reach   Nurse Communication Mobility status        Time: 3893-7342 OT Time Calculation (min): 40 min  Charges: OT General Charges $OT Visit: 1 Visit OT Treatments $Therapeutic Activity: 8-22 mins $Therapeutic Exercise: 8-22 mins $Orthotics Fit/Training: 8-22 mins  Luisa Dago, OT/L   Acute OT Clinical Specialist Acute Rehabilitation Services Pager [phone] Office [phone]    [facility] 10/24/2019, 1:25 PM

## 2019-10-24 NOTE — Plan of Care (Signed)
  Problem: Education: Goal: Knowledge of risk factors and measures for prevention of condition will improve 10/24/2019 1104 by Orvan Falconer, RN Outcome: Progressing 10/24/2019 1104 by Orvan Falconer, RN Outcome: Progressing   Problem: Coping: Goal: Psychosocial and spiritual needs will be supported 10/24/2019 1104 by Orvan Falconer, RN Outcome: Progressing 10/24/2019 1104 by Orvan Falconer, RN Outcome: Progressing   Problem: Respiratory: Goal: Will maintain a patent airway 10/24/2019 1104 by Orvan Falconer, RN Outcome: Progressing 10/24/2019 1104 by Orvan Falconer, RN Outcome: Progressing Goal: Complications related to the disease process, condition or treatment will be avoided or minimized 10/24/2019 1104 by Orvan Falconer, RN Outcome: Progressing 10/24/2019 1104 by Orvan Falconer, RN Outcome: Progressing

## 2019-10-24 NOTE — Social Work (Signed)
Spoke to dtr this AM re dc. Dtr reports DME was not delivered and she has requested a 24 hour notice prior to dc. Spoke to [place] with [employer] who reports he and their logistics attempted to deliver DME last night after 530pm but dtr did not answer calls. Discussed with dtr who says she was in a closing when called and was unable to answer. [employer] able to deliver DME today, but dtr will not be home until after 9pm.   Plan is to deliver DME to dtr's home tomorrow morning. [employer] liaison and their logistics team has spoken to dtr and confirmed she will be home to receive equipment. Dtr aware plan for d/c tomorrow. MD updated re barrier to dc and current plan.   Wandra Feinstein, MSW, LCSW [phone] (GV coverage)

## 2019-10-24 NOTE — Progress Notes (Signed)
PROGRESS NOTE Ticara Waner White  WUJ:[id number] DOB: [birth year] DOA: 10/19/2019 PCP: Cain Sieve, MD   Brief Narrative: Regina White is a 63 y.o. female with a history of COPD, asthma, pulmonary HTN, HFrEF, recurrent DVT, hypotension on midodrine, C. 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 She has been rehabilitating at [facility] where she began feeling tired with cough about 1 week ago and subsequently tested positive for covid-19 on 12/5. Due to progressing dyspnea, she was taken to the ED 12/11 where she was tachypneic, hypoxic requiring 3L O2, with patchy infiltrates bilaterally, CRP elevated to 8.3. Remdesivir and steroids were given, and the patient admitted, subsequently transferred to [facility] 12/12 requiring 15L NRB. CRP also rising. Convalescent plasma ordered. With treatment, pulmonary status has improved though she remains very weak. She will require significant home health and DME including hoyer lift and hospital bed at discharge.   Assessment & Plan: Principal Problem:   COVID-19 virus infection Active Problems:   Hypertension, benign   Hypothyroidism   Pulmonary hypertension (HCC)   Hyponatremia   Pressure injury of skin  Acute hypoxic respiratory failure due to covid-19 pneumonia in patient with significant pulmonary and cardiac comorbidities including COPD, asthma, OSA, pulmonary HTN, HFrEF.  - Hypoxia has stabilized, continue to wean as able. May very well need 2L during protracted recovery given her preexisting pulmonary pathology. - CCP ordered 12/12.  - Complete remdesivir today (12/11 - 12/15) - Continue steroids, returned to standard dosing Plan to continue x10 days (. CRP down today.  - Continue home singulair, dulera (for advair), atrovent and albuterol MDI's.   Severe sepsis: Leukopenia, tachypnea, hypothermia with AKI and respiratory failure due to  covid infection. Improving parameters. No further GDT warranted.  AKI: Suspected to be prerenal, possibly complicated by hypotension. No longer exhibits physiology of sepsis.  - Improved, continue to avoid nephrotoxins and monitor.  Recurrent DVT s/p thrombectomy and RUE intramuscular hematoma in setting of lovenox: Avoid lovenox.  - Continue apixaban 2.5mg  po BID due to bleeding history.   Pulmonary HTN: RHC 2018 with PASP ~75. Thought to be overlap of Groups I, II, and III. - Follow up outpatient with Dr. Elza Rafter.   Chronic combined HFrEF: Appear volume down-to-euvolemic. Echo July 2019 w/EF 35-40%, G1DD. LVEF improved since that time based on echo Jan 2020.  - Does not appear to be on regular guideline-directed therapy, likely due to hypotension. Will not start at this time.   1st degree AV block: Noted on telemetry.  - Avoid negative chronotropes.  - PVCs occasionally with 2 beats NSVT also noted. None in past 24 hrs, none symptomatic. DC telemetry.   Orthostatic hypotension:  - Continue midodrine  Hypothyroidism: Recent TSH at goal 0.567  - Continue synthroid   Nausea, GERD: Note normal EGD Feb 2020. 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 Roda Shutters. Last intra-articular steroid injection left hip 11/13. - Continue gabapentin, prn oxycodone - PT/OT, continue splint.  Obesity: BMI 35.  PAD: s/p left femoral vein angioplasty with iliac stenting, 06/26/19.  - Eliquis as above  RN Pressure Injury Documentation: Pressure Injury 08/05/19 Buttocks Left Stage II -  Partial thickness loss of dermis presenting as a shallow open ulcer with a red, pink wound bed without slough. quarter sized open wound, patient states area has been hurting since before she left the hos (Active)  08/05/19 1758  Location: Buttocks  Location Orientation: Left  Staging: Stage II -  Partial thickness loss of  dermis presenting as a shallow open ulcer with a red, pink wound bed without slough.  Wound Description (Comments): quarter sized open wound, patient states area has been hurting since before she left the hospital on Thursday.  Patient states she was told by hospital staff at that time that she had a "blister" on her bottom.    Present on Admission:      Pressure Injury 08/05/19 Ankle Left Stage I -  Intact skin with non-blanchable redness of a localized area usually over a bony prominence. (Active)  08/05/19 1806  Location: Ankle  Location Orientation: Left  Staging: Stage I -  Intact skin with non-blanchable redness of a localized area usually over a bony prominence.  Wound Description (Comments):   Present on Admission:      Pressure Injury 10/22/19 Sacrum Medial Stage II -  Partial thickness loss of dermis presenting as a shallow open ulcer with a red, pink wound bed without slough. (Active)  10/22/19 1957  Location: Sacrum  Location Orientation: Medial  Staging: Stage II -  Partial thickness loss of dermis presenting as a shallow open ulcer with a red, pink wound bed without slough.  Wound Description (Comments):   Present on Admission:    DVT prophylaxis: Eliquis 2.5mg  po BID per hematology at previous hospitalization Code Status: Full Family Communication: Husband updated daily Disposition Plan: SNF initially recommended, declined by patient. Will discharge to daughter's home where she will have significant family assistance, maximized home health and DME as well. She has stabilized enough for discharge, though will require significant assistance and DME at home which ave not been able to be arranged. Will hold DC until safe DC plan with all necessary equipment is secured, hopeful for 12/17.   Consultants:   None  Procedures:   None  Antimicrobials:  Remdesivir 12/11 - 12/15  Subjective: Cough is stable, constant, associated with shortness of breath at rest. No other  complaints this AM.  Objective: Vitals:   10/23/19 0400 10/23/19 0753 10/23/19 1449 10/24/19 0806  BP: 132/89 (!) 136/92 (!) 143/93 (!) 153/95  Pulse: 67 (!) 55 74 73  Resp: (!) 22 19 (!) 22 16  Temp: (!) 97.5 F (36.4 C) 97.6 F (36.4 C) 98 F (36.7 C) 98.6 F (37 C)  TempSrc: Oral Axillary Oral Oral  SpO2: 94% 92% 91% 96%  Weight:      Height:        Intake/Output Summary (Last 24 hours) at 10/24/2019 1136 Last data filed at 10/24/2019 0600 Gross per 24 hour  Intake 200 ml  Output 525 ml  Net -325 ml   Filed Weights   10/19/19 1720  Weight: 95.7 kg   Gen: 63 y.o. female in no distress Pulm: Nonlabored breathing 2LPM, still with bilateral crackles anteriorly. CV: Regular rate and rhythm. No murmur, rub, or gallop. No JVD, no pitting dependent edema. GI: Abdomen soft, non-tender, non-distended, with normoactive bowel sounds.  Ext: Warm, RUE in splint.  Skin: No new rashes, lesions or ulcers on visualized skin. Neuro: Alert and oriented. No new focal neurological deficits. Psych: Judgement and insight appear fair. Mood euthymic & affect congruent. Behavior is appropriate.    Data Reviewed: I have personally reviewed following labs and imaging studies  CBC: Recent Labs  Lab 10/19/19 1945 10/20/19 0546 10/21/19 0040  WBC 6.5 3.6* 5.5  NEUTROABS 5.2 3.3 4.9  HGB  15.3* 14.8 14.9  HCT 49.4* 47.5* 47.5*  MCV 83.0 84.1 83.9  PLT 186 172 204   Basic Metabolic Panel: Recent Labs  Lab 10/20/19 0546 10/21/19 0040 10/22/19 0445 10/23/19 0405 10/24/19 0425  NA 139 140 143 139 140  K 4.6 4.4 5.0 4.5 4.6  CL 103 106 109 107 107  CO2 25 22 24 24 24   GLUCOSE 162* 187* 160* 143* 150*  BUN 16 34* 43* 47* 46*  CREATININE 1.07* 0.75 0.76 0.71 0.82  CALCIUM 8.6* 8.7* 8.9 8.7* 8.8*  MG  --  1.9  --   --   --    GFR: Estimated Creatinine Clearance: 79.3 mL/min (by C-G formula based on SCr of 0.82 mg/dL). Liver Function Tests: Recent Labs  Lab 10/20/19 0546  10/21/19 0040 10/22/19 0445 10/23/19 0405 10/24/19 0425  AST 23 21 22  14* 12*  ALT 22 20 22 20 19   ALKPHOS 61 58 54 51 52  BILITOT 0.9 0.5 0.8 0.6 0.6  PROT 5.8* 6.2* 6.1* 5.7* 5.8*  ALBUMIN 3.1* 3.2* 3.2* 3.2* 3.3*   No results for input(s): LIPASE, AMYLASE in the last 168 hours. No results for input(s): AMMONIA in the last 168 hours. Coagulation Profile: No results for input(s): INR, PROTIME in the last 168 hours. Cardiac Enzymes: No results for input(s): CKTOTAL, CKMB, CKMBINDEX, TROPONINI in the last 168 hours. BNP (last 3 results) No results for input(s): PROBNP in the last 8760 hours. HbA1C: No results for input(s): HGBA1C in the last 72 hours. CBG: Recent Labs  Lab 10/20/19 0825  GLUCAP 148*   Lipid Profile: No results for input(s): CHOL, HDL, LDLCALC, TRIG, CHOLHDL, LDLDIRECT in the last 72 hours. Thyroid Function Tests: No results for input(s): TSH, T4TOTAL, FREET4, T3FREE, THYROIDAB in the last 72 hours. Anemia Panel: No results for input(s): VITAMINB12, FOLATE, FERRITIN, TIBC, IRON, RETICCTPCT in the last 72 hours. Urine analysis:    Component Value Date/Time   COLORURINE YELLOW (A) 08/06/2019 1647   APPEARANCEUR HAZY (A) 08/06/2019 1647   LABSPEC 1.014 08/06/2019 1647   PHURINE 6.0 08/06/2019 1647   GLUCOSEU NEGATIVE 08/06/2019 1647   HGBUR NEGATIVE 08/06/2019 1647   BILIRUBINUR NEGATIVE 08/06/2019 1647   KETONESUR NEGATIVE 08/06/2019 1647   PROTEINUR NEGATIVE 08/06/2019 1647   NITRITE NEGATIVE 08/06/2019 1647   LEUKOCYTESUR MODERATE (A) 08/06/2019 1647   Recent Results (from the past 240 hour(s))  Blood Culture (routine x 2)     Status: None (Preliminary result)   Collection Time: 10/19/19  7:46 PM   Specimen: BLOOD LEFT HAND  Result Value Ref Range Status   Specimen Description BLOOD LEFT HAND  Final   Special Requests   Final    BOTTLES DRAWN AEROBIC AND ANAEROBIC Blood Culture results may not be optimal due to an inadequate volume of blood  received in culture bottles   Culture   Final    NO GROWTH 4 DAYS Performed at [facility], [address] [place], [place] [address]    Report Status PENDING  Incomplete  Blood Culture (routine x 2)     Status: None (Preliminary result)   Collection Time: 10/19/19  8:59 PM   Specimen: BLOOD LEFT ARM  Result Value Ref Range Status   Specimen Description BLOOD LEFT ARM  Final   Special Requests   Final    BOTTLES DRAWN AEROBIC AND ANAEROBIC Blood Culture adequate volume   Culture   Final    NO GROWTH 4 DAYS Performed at [facility], [place] [address] [place], [place] [address]    Report Status PENDING  Incomplete      Radiology Studies: No results found.  Scheduled Meds: . acidophilus  2 capsule Oral TID AC  . apixaban  2.5 mg Oral BID  . cholecalciferol  2,000 Units Oral Daily  . cyclobenzaprine  5 mg Oral QHS  . dexamethasone (DECADRON) injection  6 mg Intravenous Q24H  . famotidine  20 mg Oral BID  . feeding supplement (PRO-STAT SUGAR FREE 64)  30 mL Oral QPM  . gabapentin  300 mg Oral TID  . ipratropium  2 puff Inhalation Q6H  . levothyroxine  200 mcg Oral QAC breakfast  . midodrine  10 mg Oral TID WC  . mometasone-formoterol  2 puff Inhalation BID  . montelukast  10 mg Oral Daily  . multivitamin with minerals  1 tablet Oral Daily  . sodium chloride flush  3 mL Intravenous Q12H   Continuous Infusions: . sodium chloride       LOS: 4 days   Time spent: 25 minutes.  Patrecia Pour, MD [facility] [email or web address] 10/24/2019, 11:36 AM

## 2019-10-25 LAB — COMPREHENSIVE METABOLIC PANEL
ALT: 16 U/L (ref 0–44)
AST: 12 U/L — ABNORMAL LOW (ref 15–41)
Albumin: 3.4 g/dL — ABNORMAL LOW (ref 3.5–5.0)
Alkaline Phosphatase: 58 U/L (ref 38–126)
Anion gap: 10 (ref 5–15)
BUN: 41 mg/dL — ABNORMAL HIGH (ref 8–23)
CO2: 24 mmol/L (ref 22–32)
Calcium: 8.9 mg/dL (ref 8.9–10.3)
Chloride: 104 mmol/L (ref 98–111)
Creatinine, Ser: 0.68 mg/dL (ref 0.44–1.00)
GFR calc Af Amer: >60 mL/min (ref >60)
GFR calc non Af Amer: >60 mL/min (ref >60)
Glucose, Bld: 144 mg/dL — ABNORMAL HIGH (ref 70–99)
Potassium: 4.9 mmol/L (ref 3.5–5.1)
Sodium: 138 mmol/L (ref 135–145)
Total Bilirubin: 0.7 mg/dL (ref 0.3–1.2)
Total Protein: 6.1 g/dL — ABNORMAL LOW (ref 6.5–8.1)

## 2019-10-25 LAB — C-REACTIVE PROTEIN: CRP: 0.7 mg/dL (ref <1.0)

## 2019-10-25 MED ORDER — GUAIFENESIN-DM 100-10 MG/5ML PO SYRP: 5.0000 mL | ORAL_SOLUTION | ORAL | 0 refills | Status: DC | PRN

## 2019-10-25 MED ORDER — LEVOTHYROXINE SODIUM 200 MCG PO TABS: 200.0000 ug | ORAL_TABLET | Freq: Every day | ORAL | 0 refills | Status: AC

## 2019-10-25 MED ORDER — DEXAMETHASONE 6 MG PO TABS: 6.0000 mg | ORAL_TABLET | Freq: Every evening | ORAL | 0 refills | Status: DC

## 2019-10-25 MED ORDER — APIXABAN 2.5 MG PO TABS: 2.5000 mg | ORAL_TABLET | Freq: Two times a day (BID) | ORAL | 0 refills | Status: AC

## 2019-10-25 MED ORDER — OXYCODONE HCL 5 MG PO TABS: 5.0000 mg | ORAL_TABLET | Freq: Four times a day (QID) | ORAL | 0 refills | Status: AC | PRN

## 2019-10-25 MED ORDER — FLUTICASONE-SALMETEROL 500-50 MCG/DOSE IN AEPB: 1.0000 | INHALATION_SPRAY | Freq: Two times a day (BID) | RESPIRATORY_TRACT | 0 refills | Status: AC

## 2019-10-25 MED ORDER — MIDODRINE HCL 10 MG PO TABS: 10.0000 mg | ORAL_TABLET | Freq: Three times a day (TID) | ORAL | 0 refills | Status: DC

## 2019-10-25 MED ORDER — ATROVENT HFA 17 MCG/ACT IN AERS: 2.0000 | INHALATION_SPRAY | Freq: Four times a day (QID) | RESPIRATORY_TRACT | 0 refills | Status: DC

## 2019-10-25 MED ORDER — FAMOTIDINE 20 MG PO TABS: 20.0000 mg | ORAL_TABLET | Freq: Two times a day (BID) | ORAL | 0 refills | Status: DC

## 2019-10-25 MED ORDER — MONTELUKAST SODIUM 10 MG PO TABS: 10.0000 mg | ORAL_TABLET | Freq: Every day | ORAL | 0 refills | Status: AC

## 2019-10-25 MED ORDER — ALBUTEROL SULFATE HFA 108 (90 BASE) MCG/ACT IN AERS: 2.0000 | INHALATION_SPRAY | Freq: Four times a day (QID) | RESPIRATORY_TRACT | 0 refills | Status: AC | PRN

## 2019-10-25 MED ORDER — RISAQUAD PO CAPS: 2.0000 | ORAL_CAPSULE | Freq: Three times a day (TID) | ORAL | 0 refills | Status: DC

## 2019-10-25 MED ORDER — VITAMIN D (CHOLECALCIFEROL) 25 MCG (1000 UT) PO TABS: 2000.0000 [IU] | ORAL_TABLET | Freq: Every day | ORAL | 0 refills | Status: DC

## 2019-10-25 MED ORDER — GABAPENTIN 300 MG PO CAPS: 300.0000 mg | ORAL_CAPSULE | Freq: Three times a day (TID) | ORAL | 0 refills | Status: DC

## 2019-10-25 NOTE — Progress Notes (Signed)
Occupational Therapy Treatment Patient Details Name: Regina White MRN: [id number] DOB: [birth year] Today's Date: 10/25/2019    History of present illness Pt is a 63 y.o. female admitted from [facility] on 10/19/19 with fatigue and cough; pt tested (+) COVID-19 on 12/5. Pt with AKI and severe sepsis. 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   OT comments  Pt seen for splint check. Tolerating splints without difficulty. Pt to alternate wrist cock-up and resting hand splint every 2 hours during the day at this time. Resting hand splint is to be worn during nighttime hours when sleeping. Written instructions provided and educated daughter over the phone. Recommend pt follow up with local hand specialist. Daughter asking about ramp rental - called SW and asked her to give daughter a call. SpO2 89 at rest on RA; 95 on 2L. Discussed case with MD. Plan is to DC home with HHOT.   Follow Up Recommendations  Home health OT;Supervision/Assistance - 24 hour    Equipment Recommendations  3 in 1 bedside commode;Wheelchair (measurements OT);Wheelchair cushion (measurements OT);Hospital bed(Hoyer)    Recommendations for Other Services PT consult    Precautions / Restrictions Precautions Precautions: Fall Precaution Comments: at risk for skin breakdown Required Braces or Orthoses: Splint/Cast(wrist cock-up and resting hand splint) Restrictions Weight Bearing Restrictions: Yes RLE Weight Bearing: Weight bearing as tolerated LLE Weight Bearing: Weight bearing as tolerated       Mobility Bed Mobility                  Transfers                      Balance                                           ADL either performed or assessed with clinical judgement    ADL                                               Vision       Perception     Praxis      Cognition Arousal/Alertness: Awake/alert Behavior During Therapy: WFL for tasks assessed/performed Overall Cognitive Status: Within Functional Limits for tasks assessed                                 General Comments: although decreased awareness        Exercises Other Exercises Other Exercises: [employer] HEP reviewed   Shoulder Instructions       General Comments discussed splint care, wearing schedule and importance of alternating splints every 2 hours and completing skin checks at that time. ; pt desats on RA at rest to 89; 95 with 2L    Pertinent Vitals/ Pain       Pain Assessment: Faces Faces Pain Scale: Hurts little more Pain Location: R hand/wrist with ROM Pain Descriptors / Indicators: Grimacing;Guarding Pain Intervention(s): Limited activity within patient's tolerance  Home Living  Prior Functioning/Environment              Frequency  Min 3X/week        Progress Toward Goals  OT Goals(current goals can now be found in the care plan section)  Progress towards OT goals: Progressing toward goals  Acute Rehab OT Goals Patient Stated Goal: to be able to use my hand again OT Goal Formulation: With patient Time For Goal Achievement: 11/05/19 Potential to Achieve Goals: Good ADL Goals Pt Will Perform Grooming: with set-up;with supervision;sitting;with adaptive equipment Pt Will Perform Upper Body Dressing: with min guard assist;sitting Pt Will Perform Lower Body Dressing: with mod assist;sit to/from stand Pt Will Transfer to Toilet: with min assist;stand pivot transfer;bedside commode Additional ADL Goal #1: Pt will perform bed mobility with Min A in preparation for ADLs Additional ADL Goal #2: Pt will independently verbalize understanding regarding wearing time  and precuations with splint wear Additional ADL Goal #3: Pt will berbalize understanding of HEP for R hand/wrist to increase funcitonal use of hand  Plan Discharge plan remains appropriate;Frequency remains appropriate    Co-evaluation                 AM-PAC OT "6 Clicks" Daily Activity     Outcome Measure   Help from another person eating meals?: A Little Help from another person taking care of personal grooming?: A Little Help from another person toileting, which includes using toliet, bedpan, or urinal?: A Lot Help from another person bathing (including washing, rinsing, drying)?: A Lot Help from another person to put on and taking off regular upper body clothing?: A Lot Help from another person to put on and taking off regular lower body clothing?: Total 6 Click Score: 13    End of Session Equipment Utilized During Treatment: Oxygen(2L)  Pain - Right/Left: Right Pain - part of body: Hand   Activity Tolerance     Patient Left     Nurse Communication          Time: 9024-0973 OT Time Calculation (min): 35 min  Charges: OT General Charges $OT Visit: 1 Visit OT Treatments $Therapeutic Activity: 8-22 mins $Orthotics/Prosthetics Check: 8-22 mins  Maurie Boettcher, OT/L   Acute OT Clinical Specialist Acute Rehabilitation Services Pager [phone] Office [phone]    [facility] 10/25/2019, 10:14 AM

## 2019-10-25 NOTE — Plan of Care (Signed)
  Problem: Education: Goal: Knowledge of risk factors and measures for prevention of condition will improve Outcome: Progressing   Problem: Coping: Goal: Psychosocial and spiritual needs will be supported Outcome: Progressing   Problem: Respiratory: Goal: Will maintain a patent airway Outcome: Progressing Goal: Complications related to the disease process, condition or treatment will be avoided or minimized Outcome: Progressing   

## 2019-10-25 NOTE — Care Management Important Message (Signed)
Important Message  Patient Details  Name: SHAKORA NORDQUIST MRN: [id number] Date of Birth: 1955-12-21   Medicare Important Message Given:  Yes - Important Message mailed due to current National Emergency  Verbal consent obtained due to current National Emergency  Relationship to patient: Spouse/Significant Other Contact Name: Lesleyanne Politte Call Date: 10/25/19  Time: 1553 Phone: [phone] Outcome: No Answer/Busy Important Message mailed to: Patient address on file    Delorse Lek 10/25/2019, 3:53 PM

## 2019-10-25 NOTE — Progress Notes (Deleted)
SPO2 at rest on 2L [place]: 96% SPO2 while ambulating on 2L [place]: 92% SPO2 at rest on room air: 88%

## 2019-10-25 NOTE — Progress Notes (Signed)
CSW called and spoke to pt's daughter Anderson Malta who stated pt's oxygen has arrived at her home and the supplier is going over with the pt's daughter Anderson Malta, how to use it now.  CSW will continue to follow for D/C needs.  Alphonse Guild. Joselle Deeds, LCSW, LCAS, CSI Transitions of Care Clinical Social Worker Care Coordination Department Ph: [phone]

## 2019-10-25 NOTE — Progress Notes (Signed)
SPO2 at rest on room air: 88% SPO2 at rest on 2L [place]: 96% Patient does not ambulate

## 2019-10-25 NOTE — Progress Notes (Signed)
Patient set to discharge home todayAlvis Lemmings [facility] follow patient and all orders placed by MD. All DME delivered today- expect O2. O2 currently in route to daughter address- delivery time between 4-6:30PM. Once delivered patient is set to discharge home.   CSW has already scheduled transportation home for 6:30PM. Will request 2nd shift CSW/CM to follow up around 6PM to make sure o2 has been delivered before transport.   Kingsley Spittle, LCSW Transitions of [place]  [phone]

## 2019-10-25 NOTE — Progress Notes (Signed)
Patient discharged home with family via [place], PIV removed, paperwork sent home with patient.

## 2019-10-25 NOTE — Discharge Summary (Signed)
Physician Discharge Summary  Regina White ZOX:[id number] DOB: [birth year] DOA: 10/19/2019  PCP: Cain Sieve, MD  Admit date: 10/19/2019 Discharge date: 10/25/2019  Admitted From: SNF Disposition: Home   Recommendations for Outpatient Follow-up:  1. Follow up with PCP in 1-2 weeks 2. Please obtain CMP/CBC in one week 3. Needs orthopedics follow up with Dr. Roda Shutters for end-stage DJD of hips and with Dr. Amanda Pea for right radial nerve palsy  Home Health: PT, OT, RN, aide Equipment/Devices: Hospital bed, hoyer lift, wheelchair, splints, 2L O2 Discharge Condition: Stable CODE STATUS: Full Diet recommendation: Heart healthy  Brief/Interim Summary: Miette N Rensingis a62 y.o.femalewith a history of COPD, asthma, pulmonary HTN, HFrEF, recurrent DVT, hypotension on midodrine, C. 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 She has been rehabilitating at [facility] where she began feeling tired with cough about 1 week ago and subsequently tested positive for covid-19 on 12/5. Due to progressing dyspnea, she was taken to the ED 12/11 where she was tachypneic, hypoxic requiring 3L O2, with patchy infiltrates bilaterally, CRP elevated to 8.3. Remdesivir and steroids were given, and the patient admitted, subsequently transferred to [facility] 12/12 requiring 15L NRB. CRP also rising. Convalescent plasma was given. With treatment, pulmonary status has improved though she remains very weak. Hypoxia has improved dramatically and stabilized to 2L O2 requirement. CRP has normalized. Return to SNF was recommended but declined by patient and family. She will require significant home health and DME including hoyer lift and hospital bed at discharge.   Discharge Diagnoses:  Principal Problem:   COVID-19 virus infection Active Problems:   Hypertension, benign   Hypothyroidism   Pulmonary hypertension (HCC)    Hyponatremia   Pressure injury of skin  Acute hypoxic respiratory failure due to covid-19 pneumonia in patient with significant pulmonary and cardiac comorbidities including COPD, asthma, OSA, pulmonary HTN, HFrEF.  - Hypoxia has stabilized, may very well need 2L during protracted recovery given her preexisting pulmonary pathology. - CCP ordered 12/12.  - Completed remdesivir (12/11 - 12/15) - Continue steroids, returned to standard dosing Plan to continue x10 days (12/11 - 12/20). CRP returned to normal. - Continue home singulair, dulera (for advair), atrovent and albuterol MDI's.   Severe sepsis: Leukopenia, tachypnea, hypothermia with AKI and respiratory failure due to covid infection. Improving parameters. No further GDT warranted.  AKI: Suspected to be prerenal, possibly complicated by hypotension. No longer exhibits physiology of sepsis.  - Improved, continue to avoid nephrotoxins and monitor.  Recurrent DVT s/p thrombectomy and RUE intramuscular hematoma in setting of lovenox: Avoid lovenox.  - Continue apixaban 2.5mg  po BID due to bleeding history.   Pulmonary HTN:RHC 2018 with PASP~75. Thought to be overlap of Groups I, II, and III. -Followupoutpatient with Dr. Elza Rafter.   Chronic combined HFrEF: Appear volume down-to-euvolemic. Echo July 2019 w/EF 35-40%, G1DD. LVEF improved since that time based on echo Jan 2020.  - Does not appear to be on regular guideline-directed therapy, likely due to hypotension. Will not start at this time.   1st degree AV block: Noted on telemetry.  - Avoid negative chronotropes.   Orthostatic hypotension:  - Continue midodrine  Hypothyroidism: Recent TSH at goal 0.567  - Continue synthroid   Nausea, GERD: Note normal EGD Feb 2020. 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 Roda Shutters for hips. Last intra-articular steroid injection left  hip  11/13. - Continue gabapentin, prn oxycodone. PDMP reviewed, last prescription was filled Sept 24, 2020. Morphine powder consistently prescribed by Romeo RabonKamal Ajam last filled 09/07/2019.  - Establish care in clinic with Dr. Amanda PeaGramig per OT suggestion for RUE debility/right radial nerve palsy. Continue PT/OT, continue splint.  Obesity: BMI 35.  PAD: s/p leftfemoral vein angioplasty with iliac stenting, 06/26/19.  - Eliquis as above  RN Pressure Injury Documentation: Pressure Injury 08/05/19 Buttocks Left Stage II -  Partial thickness loss of dermis presenting as a shallow open ulcer with a red, pink wound bed without slough. quarter sized open wound, patient states area has been hurting since before she left the hos (Active)  08/05/19 1758  Location: Buttocks  Location Orientation: Left  Staging: Stage II -  Partial thickness loss of dermis presenting as a shallow open ulcer with a red, pink wound bed without slough.  Wound Description (Comments): quarter sized open wound, patient states area has been hurting since before she left the hospital on Thursday.  Patient states she was told by hospital staff at that time that she had a "blister" on her bottom.    Present on Admission:      Pressure Injury 08/05/19 Ankle Left Stage I -  Intact skin with non-blanchable redness of a localized area usually over a bony prominence. (Active)  08/05/19 1806  Location: Ankle  Location Orientation: Left  Staging: Stage I -  Intact skin with non-blanchable redness of a localized area usually over a bony prominence.  Wound Description (Comments):   Present on Admission:      Pressure Injury 10/22/19 Sacrum Medial Stage II -  Partial thickness loss of dermis presenting as a shallow open ulcer with a red, pink wound bed without slough. (Active)  10/22/19 1957  Location: Sacrum  Location Orientation: Medial  Staging: Stage II -  Partial thickness loss of dermis presenting as a shallow open ulcer with a red, pink  wound bed without slough.  Wound Description (Comments):   Present on Admission:    Discharge Instructions Discharge Instructions    Diet - low sodium heart healthy   Complete by: As directed    Discharge instructions   Complete by: As directed    You are being discharged from the hospital after treatment for covid-19 infection. You are felt to be stable enough to no longer require inpatient monitoring, testing, and treatment, though you will need to follow the recommendations below: - Continue taking decadron 6mg  daily for 3 more doses starting 12/18. - Remain in self-isolation for 21 days following your first positive test to reduce risk of transmission of the virus.  - Please follow up with orthopedics and your primary doctor per routine. The occupational therapist has recommended that you follow up with another orthopedic physician, Dr. Amanda PeaGramig, whose clinic contact is included with this paperwork. Please call to schedule an appointment for the right radial nerve palsy.  - Do not take NSAID medications (including, but not limited to, ibuprofen, advil, motrin, naproxen, aleve, goody's powder, etc.) - Follow up with your doctor in the next week via telehealth or seek medical attention right away if your symptoms get WORSE.  - Consider donating plasma after you have recovered (either 14 days after a negative test or 28 days after symptoms have completely resolved) because your antibodies to this virus may be helpful to give to others with life-threatening infections. Please go to the website www.[email or web address] if you would like to consider volunteering for plasma donation.  Directions for you at home:  Wear a facemask You should wear a facemask that covers your nose and mouth when you are in the same room with other people and when you visit a healthcare provider. People who live with or visit you should also wear a facemask while they are in the same room with you.  Separate yourself from  other people in your home As much as possible, you should stay in a different room from other people in your home. Also, you should use a separate bathroom, if available.  Avoid sharing household items You should not share dishes, drinking glasses, cups, eating utensils, towels, bedding, or other items with other people in your home. After using these items, you should wash them thoroughly with soap and water.  Cover your coughs and sneezes Cover your mouth and nose with a tissue when you cough or sneeze, or you can cough or sneeze into your sleeve. Throw used tissues in a lined trash can, and immediately wash your hands with soap and water for at least 20 seconds or use an alcohol-based hand rub.  Wash your [employer] your hands often and thoroughly with soap and water for at least 20 seconds. You can use an alcohol-based hand sanitizer if soap and water are not available and if your hands are not visibly dirty. Avoid touching your eyes, nose, and mouth with unwashed hands.  Directions for those who live with, or provide care at home for you:  Limit the number of people who have contact with the patient If possible, have only one caregiver for the patient. Other household members should stay in another home or place of residence. If this is not possible, they should stay in another room, or be separated from the patient as much as possible. Use a separate bathroom, if available. Restrict visitors who do not have an essential need to be in the home.  Ensure good ventilation Make sure that shared spaces in the home have good air flow, such as from an air conditioner or an opened window, weather permitting.  Wash your hands often Wash your hands often and thoroughly with soap and water for at least 20 seconds. You can use an alcohol based hand sanitizer if soap and water are not available and if your hands are not visibly dirty. Avoid touching your eyes, nose, and mouth with unwashed  hands. Use disposable paper towels to dry your hands. If not available, use dedicated cloth towels and replace them when they become wet.  Wear a facemask and gloves Wear a disposable facemask at all times in the room and gloves when you touch or have contact with the patient's blood, body fluids, and/or secretions or excretions, such as sweat, saliva, sputum, nasal mucus, vomit, urine, or feces.  Ensure the mask fits over your nose and mouth tightly, and do not touch it during use. Throw out disposable facemasks and gloves after using them. Do not reuse. Wash your hands immediately after removing your facemask and gloves. If your personal clothing becomes contaminated, carefully remove clothing and launder. Wash your hands after handling contaminated clothing. Place all used disposable facemasks, gloves, and other waste in a lined container before disposing them with other household waste. Remove gloves and wash your hands immediately after handling these items.  Do not share dishes, glasses, or other household items with the patient Avoid sharing household items. You should not share dishes, drinking glasses, cups, eating utensils, towels, bedding, or other items with a  patient who is confirmed to have, or being evaluated for, COVID-19 infection. After the person uses these items, you should wash them thoroughly with soap and water.  Wash laundry thoroughly Immediately remove and wash clothes or bedding that have blood, body fluids, and/or secretions or excretions, such as sweat, saliva, sputum, nasal mucus, vomit, urine, or feces, on them. Wear gloves when handling laundry from the patient. Read and follow directions on labels of laundry or clothing items and detergent. In general, wash and dry with the warmest temperatures recommended on the label.  Clean all areas the individual has used often Clean all touchable surfaces, such as counters, tabletops, doorknobs, bathroom fixtures, toilets,  phones, keyboards, tablets, and bedside tables, every day. Also, clean any surfaces that may have blood, body fluids, and/or secretions or excretions on them. Wear gloves when cleaning surfaces the patient has come in contact with. Use a diluted bleach solution (e.g., dilute bleach with 1 part bleach and 10 parts water) or a household disinfectant with a label that says EPA-registered for coronaviruses. To make a bleach solution at home, add 1 tablespoon of bleach to 1 quart (4 cups) of water. For a larger supply, add  cup of bleach to 1 gallon (16 cups) of water. Read labels of cleaning products and follow recommendations provided on product labels. Labels contain instructions for safe and effective use of the cleaning product including precautions you should take when applying the product, such as wearing gloves or eye protection and making sure you have good ventilation during use of the product. Remove gloves and wash hands immediately after cleaning.  Monitor yourself for signs and symptoms of illness Caregivers and household members are considered close contacts, should monitor their health, and will be asked to limit movement outside of the home to the extent possible. Follow the monitoring steps for close contacts listed on the symptom monitoring form.  If you have additional questions, contact your local health department or call the epidemiologist on call at [phone] (available 24/7). This guidance is subject to change. For the most up-to-date guidance from [facility], please refer to their website: YouBlogs.pl   Increase activity slowly   Complete by: As directed    MyChart COVID-19 home monitoring program   Complete by: Oct 25, 2019    Is the patient willing to use the [facility] for home monitoring?: Yes     Allergies as of 10/25/2019      Reactions   Penicillins Anaphylaxis   Breathing problems Did it involve  swelling of the face/tongue/throat, SOB, or low BP? Yes Did it involve sudden or severe rash/hives, skin peeling, or any reaction on the inside of your mouth or nose? Unknown Did you need to seek medical attention at a hospital or doctor's office? Unknown When did it last happen?unknown If all above answers are "NO", may proceed with cephalosporin use. Patient tolerated Ceftriaxone September 2020   Erythromycin Nausea And Vomiting, Rash   STOMACH PAIN   Levofloxacin Other (See Comments)   Muscle aches   Lidocaine Rash   Clindamycin Other (See Comments)   Recurrent C diff   Levothyroxine Other (See Comments)   Vaginal bleeding Reaction to generic (pt currently takes name brand Synthroid)   Tiotropium Other (See Comments)   Coughing up blood   Doxycycline Nausea And Vomiting   Sulfa Antibiotics Rash      Medication List    TAKE these medications   acetaminophen 325 MG tablet Commonly known as: TYLENOL Take 650  mg by mouth every 4 (four) hours as needed (temp >99.5).   acidophilus Caps capsule Take 2 capsules by mouth 3 (three) times daily before meals.   albuterol 108 (90 Base) MCG/ACT inhaler Commonly known as: VENTOLIN HFA Inhale 2 puffs into the lungs every 6 (six) hours as needed for wheezing or shortness of breath.   apixaban 2.5 MG Tabs tablet Commonly known as: ELIQUIS Take 2.5 mg by mouth 2 (two) times daily.   Atrovent HFA 17 MCG/ACT inhaler Generic drug: ipratropium Inhale 2 puffs into the lungs 4 (four) times daily.   cyclobenzaprine 5 MG tablet Commonly known as: FLEXERIL Take 5 mg by mouth See admin instructions. Take one tablet (5 mg) by mouth daily at bedtime, may also take one tablet (5 mg) daily as needed for muscle spasms   Decubi-Vite Caps Take 1 capsule by mouth every evening.   dexamethasone 6 MG tablet Commonly known as: DECADRON Take 1 tablet (6 mg total) by mouth every evening.   famotidine 20 MG tablet Commonly known as: PEPCID Take  20 mg by mouth 2 (two) times daily.   feeding supplement (PRO-STAT SUGAR FREE 64) Liqd Take 30 mLs by mouth every evening.   Fluticasone-Salmeterol 500-50 MCG/DOSE Aepb Commonly known as: ADVAIR Inhale 1 puff into the lungs 2 (two) times daily.   gabapentin 300 MG capsule Commonly known as: NEURONTIN Take 300 mg by mouth 3 (three) times daily.   metoCLOPramide 10 MG tablet Commonly known as: REGLAN Take 10 mg by mouth daily as needed for nausea or vomiting.   midodrine 10 MG tablet Commonly known as: PROAMATINE Take 1 tablet (10 mg total) by mouth 3 (three) times daily with meals.   montelukast 10 MG tablet Commonly known as: SINGULAIR Take 10 mg by mouth at bedtime.   multivitamin with minerals Tabs tablet Take 1 tablet by mouth daily.   NUTRITIONAL SUPPLEMENT PO Take 90 mLs by mouth 2 (two) times daily.   oxyCODONE 5 MG immediate release tablet Commonly known as: Roxicodone Take 1 tablet (5 mg total) by mouth every 6 (six) hours as needed for up to 5 days (severe pain). What changed: reasons to take this   OXYGEN Inhale 2 L into the lungs continuous.   polyethylene glycol 17 g packet Commonly known as: MIRALAX / GLYCOLAX Take 17 g by mouth daily as needed (constipation).   Synthroid 200 MCG tablet Generic drug: levothyroxine Take 200 mcg by mouth daily at 6 (six) AM.   Vitamin D (Cholecalciferol) 25 MCG (1000 UT) Tabs Take 2,000 Units by mouth daily.            Durable Medical Equipment  (From admission, onward)         Start     Ordered   10/24/19 1136  For home use only DME standard manual wheelchair with seat cushion  Once    Comments: Patient suffers from covid-19, muscular deconditioning which impairs their ability to perform daily activities like bathing, dressing, feeding, grooming and toileting in the home.  A cane or walker will not resolve issue with performing activities of daily living. A wheelchair will allow patient to safely perform daily  activities. Patient can safely propel the wheelchair in the home or has a caregiver who can provide assistance. Length of need 12 months . Accessories: elevating leg rests (ELRs), wheel locks, extensions and anti-tippers.   10/24/19 1135   10/24/19 1135  For home use only DME 3 n 1  Once  10/24/19 1135   10/23/19 1402  For home use only DME Hospital bed  Once    Question Answer Comment  Length of Need 12 Months   The above medical condition requires: Patient requires the ability to reposition frequently   Bed type Semi-electric   Hoyer Lift Yes      10/23/19 1403         Follow-up Information    [facility], [facility] Follow up.   Specialty: Home Health Services Why: agency will provide home health physical therapy, occupational therapy, nurse, and aide. agency will call you to schedule first visit. Contact information: [address] [place] [address] [phone]        Cain Sieve, MD. Schedule an appointment as soon as possible for a visit in 1 week(s).   Specialty: Internal Medicine Contact information: [address] [facility] [id number] [facility] [place] [address] [phone]        Dominica Severin, MD. Call.   Specialty: Orthopedic Surgery Why: to schedule an appointment for right radial nervy palsy Contact information: [address] [place] [address] [phone]          Allergies  Allergen Reactions  . Penicillins Anaphylaxis    Breathing problems Did it involve swelling of the face/tongue/throat, SOB, or low BP? Yes Did it involve sudden or severe rash/hives, skin peeling, or any reaction on the inside of your mouth or nose? Unknown Did you need to seek medical attention at a hospital or doctor's office? Unknown When did it last happen?unknown If all above answers are "NO", may proceed with cephalosporin use. Patient tolerated Ceftriaxone September 2020  . Erythromycin Nausea  And Vomiting and Rash    STOMACH PAIN  . Levofloxacin Other (See Comments)    Muscle aches  . Lidocaine Rash  . Clindamycin Other (See Comments)    Recurrent C diff  . Levothyroxine Other (See Comments)    Vaginal bleeding Reaction to generic (pt currently takes name brand Synthroid)  . Tiotropium Other (See Comments)    Coughing up blood  . Doxycycline Nausea And Vomiting  . Sulfa Antibiotics Rash    Consultations:  None  Procedures/Studies: DG Chest Port 1 View  Result Date: 10/19/2019 CLINICAL DATA:  COVID, pneumonia EXAM: PORTABLE CHEST 1 VIEW COMPARISON:  08/05/2019 FINDINGS: Cardiomegaly. There is patchy heterogeneous airspace disease bilaterally. The visualized skeletal structures are unremarkable. IMPRESSION: 1. Patchy heterogeneous airspace disease bilaterally, consistent with multifocal infection and in keeping with reported diagnosis of COVID-19. 2. Cardiomegaly. Electronically Signed   By: Lauralyn Primes M.D.   On: 10/19/2019 19:31    Subjective: Pain is controlled. Shortness of breath is improved significantly. Ready to go home.   Discharge Exam: Vitals:   10/25/19 0736 10/25/19 0832  BP: (!) 144/81 (!) 128/46  Pulse: 96   Resp:  20  Temp: 98.3 F (36.8 C) 98.2 F (36.8 C)  SpO2: 97%    General: Pleasant chronically ill-appearing in no distress Cardiovascular: RRR, S1/S2 +, no rubs, no gallops Respiratory: Nonlabored on 2L O2, crackles bilaterally improved but remain without wheezing. Abdominal: Soft, NT, ND, bowel sounds + Extremities: No LE edema, no cyanosis. RUE with radial nerve palsy.  Labs: BNP (last 3 results) Recent Labs    12/04/18 1439 07/22/19 0916 08/05/19 1132  BNP 1,737.0* 276.0* 168.0*   Basic Metabolic Panel: Recent Labs  Lab 10/21/19 0040 10/22/19 0445 10/23/19 0405 10/24/19 0425 10/25/19 0500  NA 140 143 139 140  138  K 4.4 5.0 4.5 4.6 4.9  CL 106 109 107 107 104  CO2 GLUCOSE 187* 160* 143* 150* 144*   BUN 34* 43* 47* 46* 41*  CREATININE 0.75 0.76 0.71 0.82 0.68  CALCIUM 8.7* 8.9 8.7* 8.8* 8.9  MG 1.9  --   --   --   --    Liver Function Tests: Recent Labs  Lab 10/21/19 0040 10/22/19 0445 10/23/19 0405 10/24/19 0425 10/25/19 0500  AST 21 22 14* 12* 12*  ALT ALKPHOS 58 54 51 52 58  BILITOT 0.5 0.8 0.6 0.6 0.7  PROT 6.2* 6.1* 5.7* 5.8* 6.1*  ALBUMIN 3.2* 3.2* 3.2* 3.3* 3.4*   No results for input(s): LIPASE, AMYLASE in the last 168 hours. No results for input(s): AMMONIA in the last 168 hours. CBC: Recent Labs  Lab 10/19/19 1945 10/20/19 0546 10/21/19 0040  WBC 6.5 3.6* 5.5  NEUTROABS 5.2 3.3 4.9  HGB 15.3* 14.8 14.9  HCT 49.4* 47.5* 47.5*  MCV 83.0 84.1 83.9  PLT 186 172 204   Cardiac Enzymes: No results for input(s): CKTOTAL, CKMB, CKMBINDEX, TROPONINI in the last 168 hours. BNP: Invalid input(s): POCBNP CBG: Recent Labs  Lab 10/20/19 0825  GLUCAP 148*   D-Dimer No results for input(s): DDIMER in the last 72 hours. Hgb A1c No results for input(s): HGBA1C in the last 72 hours. Lipid Profile No results for input(s): CHOL, HDL, LDLCALC, TRIG, CHOLHDL, LDLDIRECT in the last 72 hours. Thyroid function studies No results for input(s): TSH, T4TOTAL, T3FREE, THYROIDAB in the last 72 hours.  Invalid input(s): FREET3 Anemia work up No results for input(s): VITAMINB12, FOLATE, FERRITIN, TIBC, IRON, RETICCTPCT in the last 72 hours. Urinalysis    Component Value Date/Time   COLORURINE YELLOW (A) 08/06/2019 1647   APPEARANCEUR HAZY (A) 08/06/2019 1647   LABSPEC 1.014 08/06/2019 1647   PHURINE 6.0 08/06/2019 1647   GLUCOSEU NEGATIVE 08/06/2019 1647   HGBUR NEGATIVE 08/06/2019 1647   BILIRUBINUR NEGATIVE 08/06/2019 1647   KETONESUR NEGATIVE 08/06/2019 1647   PROTEINUR NEGATIVE 08/06/2019 1647   NITRITE NEGATIVE 08/06/2019 1647   LEUKOCYTESUR MODERATE (A) 08/06/2019 1647    Microbiology Recent Results (from the past 240 hour(s))  Blood  Culture (routine x 2)     Status: None   Collection Time: 10/19/19  7:46 PM   Specimen: BLOOD LEFT HAND  Result Value Ref Range Status   Specimen Description BLOOD LEFT HAND  Final   Special Requests   Final    BOTTLES DRAWN AEROBIC AND ANAEROBIC Blood Culture results may not be optimal due to an inadequate volume of blood received in culture bottles   Culture   Final    NO GROWTH 5 DAYS Performed at [facility], [address], [place], [place] [address]    Report Status 10/24/2019 FINAL  Final  Blood Culture (routine x 2)     Status: None   Collection Time: 10/19/19  8:59 PM   Specimen: BLOOD LEFT ARM  Result Value Ref Range Status   Specimen Description BLOOD LEFT ARM  Final   Special Requests   Final    BOTTLES DRAWN AEROBIC AND ANAEROBIC Blood Culture adequate volume   Culture   Final    NO GROWTH 5 DAYS Performed at [facility], [address] [place], [place] [address]    Report Status 10/24/2019 FINAL  Final    Time coordinating discharge:  Approximately 40 minutes  Tyrone Nine, MD  [facility] [facility]ists 10/25/2019, 10:23 AM

## 2019-10-25 NOTE — Progress Notes (Signed)
Patient received and understood all discharge instructions. Waiting for confirmation that all medical equipment has been delivered to home; once done, secretary/charge nurse will arrange transport home. Will discontinue patient's iv once confirmed transport is here and ready to take patient. All questions/concerns addressed with patient. Patient has all prescriptions printed and in hand, and all patient belongings accounted for.

## 2019-11-26 ENCOUNTER — Other Ambulatory Visit: Payer: Self-pay

## 2019-12-26 ENCOUNTER — Encounter: Payer: Self-pay | Admitting: Radiology

## 2019-12-26 LAB — HIV ANTIBODY (ROUTINE TESTING W REFLEX): HIV Screen 4th Generation wRfx: NONREACTIVE

## 2020-04-24 ENCOUNTER — Inpatient Hospital Stay
Admission: EM | Admit: 2020-04-24 | Discharge: 2020-04-29 | DRG: 871 | Disposition: A | Discharge status: Home Health | Payer: Medicare Other | Attending: Internal Medicine | Admitting: Internal Medicine

## 2020-04-24 ENCOUNTER — Emergency Department: Payer: Medicare Other

## 2020-04-24 ENCOUNTER — Other Ambulatory Visit: Payer: Self-pay

## 2020-04-24 DIAGNOSIS — I420 Dilated cardiomyopathy: Secondary | ICD-10-CM | POA: Diagnosis present

## 2020-04-24 DIAGNOSIS — Z9981 Dependence on supplemental oxygen: Secondary | ICD-10-CM | POA: Diagnosis not present

## 2020-04-24 DIAGNOSIS — E871 Hypo-osmolality and hyponatremia: Secondary | ICD-10-CM | POA: Diagnosis present

## 2020-04-24 DIAGNOSIS — A401 Sepsis due to streptococcus, group B: Secondary | ICD-10-CM | POA: Diagnosis present

## 2020-04-24 DIAGNOSIS — L89142 Pressure ulcer of left lower back, stage 2: Secondary | ICD-10-CM | POA: Diagnosis present

## 2020-04-24 DIAGNOSIS — Z7189 Other specified counseling: Secondary | ICD-10-CM | POA: Diagnosis not present

## 2020-04-24 DIAGNOSIS — E039 Hypothyroidism, unspecified: Secondary | ICD-10-CM | POA: Diagnosis present

## 2020-04-24 DIAGNOSIS — N179 Acute kidney failure, unspecified: Secondary | ICD-10-CM | POA: Diagnosis present

## 2020-04-24 DIAGNOSIS — L89152 Pressure ulcer of sacral region, stage 2: Secondary | ICD-10-CM | POA: Diagnosis present

## 2020-04-24 DIAGNOSIS — I272 Pulmonary hypertension, unspecified: Secondary | ICD-10-CM | POA: Diagnosis not present

## 2020-04-24 DIAGNOSIS — A419 Sepsis, unspecified organism: Secondary | ICD-10-CM | POA: Diagnosis present

## 2020-04-24 DIAGNOSIS — I959 Hypotension, unspecified: Secondary | ICD-10-CM | POA: Diagnosis not present

## 2020-04-24 DIAGNOSIS — L981 Factitial dermatitis: Secondary | ICD-10-CM | POA: Diagnosis not present

## 2020-04-24 DIAGNOSIS — L89132 Pressure ulcer of right lower back, stage 2: Secondary | ICD-10-CM | POA: Diagnosis present

## 2020-04-24 DIAGNOSIS — Z452 Encounter for adjustment and management of vascular access device: Secondary | ICD-10-CM

## 2020-04-24 DIAGNOSIS — Z87891 Personal history of nicotine dependence: Secondary | ICD-10-CM | POA: Diagnosis not present

## 2020-04-24 DIAGNOSIS — Z79890 Hormone replacement therapy (postmenopausal): Secondary | ICD-10-CM | POA: Diagnosis not present

## 2020-04-24 DIAGNOSIS — J9601 Acute respiratory failure with hypoxia: Secondary | ICD-10-CM | POA: Diagnosis present

## 2020-04-24 DIAGNOSIS — M1612 Unilateral primary osteoarthritis, left hip: Secondary | ICD-10-CM | POA: Diagnosis present

## 2020-04-24 DIAGNOSIS — L03317 Cellulitis of buttock: Secondary | ICD-10-CM | POA: Diagnosis not present

## 2020-04-24 DIAGNOSIS — I5030 Unspecified diastolic (congestive) heart failure: Secondary | ICD-10-CM | POA: Diagnosis present

## 2020-04-24 DIAGNOSIS — R7881 Bacteremia: Secondary | ICD-10-CM | POA: Diagnosis not present

## 2020-04-24 DIAGNOSIS — R6521 Severe sepsis with septic shock: Secondary | ICD-10-CM | POA: Diagnosis present

## 2020-04-24 DIAGNOSIS — N390 Urinary tract infection, site not specified: Secondary | ICD-10-CM | POA: Diagnosis present

## 2020-04-24 DIAGNOSIS — Z7901 Long term (current) use of anticoagulants: Secondary | ICD-10-CM | POA: Diagnosis not present

## 2020-04-24 DIAGNOSIS — L89322 Pressure ulcer of left buttock, stage 2: Secondary | ICD-10-CM | POA: Diagnosis present

## 2020-04-24 DIAGNOSIS — G8929 Other chronic pain: Secondary | ICD-10-CM | POA: Diagnosis present

## 2020-04-24 DIAGNOSIS — Z981 Arthrodesis status: Secondary | ICD-10-CM

## 2020-04-24 DIAGNOSIS — G894 Chronic pain syndrome: Secondary | ICD-10-CM | POA: Diagnosis present

## 2020-04-24 DIAGNOSIS — I87022 Postthrombotic syndrome with inflammation of left lower extremity: Secondary | ICD-10-CM | POA: Diagnosis not present

## 2020-04-24 DIAGNOSIS — B951 Streptococcus, group B, as the cause of diseases classified elsewhere: Secondary | ICD-10-CM | POA: Diagnosis not present

## 2020-04-24 DIAGNOSIS — J441 Chronic obstructive pulmonary disease with (acute) exacerbation: Secondary | ICD-10-CM | POA: Diagnosis present

## 2020-04-24 DIAGNOSIS — A409 Streptococcal sepsis, unspecified: Secondary | ICD-10-CM | POA: Diagnosis not present

## 2020-04-24 DIAGNOSIS — I110 Hypertensive heart disease with heart failure: Secondary | ICD-10-CM | POA: Diagnosis present

## 2020-04-24 DIAGNOSIS — Z86718 Personal history of other venous thrombosis and embolism: Secondary | ICD-10-CM | POA: Diagnosis not present

## 2020-04-24 DIAGNOSIS — I5043 Acute on chronic combined systolic (congestive) and diastolic (congestive) heart failure: Secondary | ICD-10-CM | POA: Diagnosis present

## 2020-04-24 DIAGNOSIS — Z7951 Long term (current) use of inhaled steroids: Secondary | ICD-10-CM

## 2020-04-24 DIAGNOSIS — N170 Acute kidney failure with tubular necrosis: Secondary | ICD-10-CM | POA: Diagnosis not present

## 2020-04-24 DIAGNOSIS — J9621 Acute and chronic respiratory failure with hypoxia: Secondary | ICD-10-CM | POA: Diagnosis present

## 2020-04-24 DIAGNOSIS — Z6838 Body mass index (BMI) 38.0-38.9, adult: Secondary | ICD-10-CM

## 2020-04-24 DIAGNOSIS — Z8616 Personal history of COVID-19: Secondary | ICD-10-CM

## 2020-04-24 DIAGNOSIS — I5023 Acute on chronic systolic (congestive) heart failure: Secondary | ICD-10-CM | POA: Diagnosis not present

## 2020-04-24 DIAGNOSIS — Z7401 Bed confinement status: Secondary | ICD-10-CM | POA: Diagnosis not present

## 2020-04-24 DIAGNOSIS — I5021 Acute systolic (congestive) heart failure: Secondary | ICD-10-CM | POA: Diagnosis not present

## 2020-04-24 DIAGNOSIS — Z515 Encounter for palliative care: Secondary | ICD-10-CM

## 2020-04-24 DIAGNOSIS — I10 Essential (primary) hypertension: Secondary | ICD-10-CM | POA: Diagnosis present

## 2020-04-24 DIAGNOSIS — Z79899 Other long term (current) drug therapy: Secondary | ICD-10-CM

## 2020-04-24 LAB — COMPREHENSIVE METABOLIC PANEL
ALT: 12 U/L (ref 0–44)
ALT: 12 U/L (ref 0–44)
AST: 12 U/L — ABNORMAL LOW (ref 15–41)
AST: 14 U/L — ABNORMAL LOW (ref 15–41)
Albumin: 3.3 g/dL — ABNORMAL LOW (ref 3.5–5.0)
Albumin: 3.1 g/dL — ABNORMAL LOW (ref 3.5–5.0)
Alkaline Phosphatase: 58 U/L (ref 38–126)
Alkaline Phosphatase: 51 U/L (ref 38–126)
Anion gap: 9 (ref 5–15)
Anion gap: 7 (ref 5–15)
BUN: 40 mg/dL — ABNORMAL HIGH (ref 8–23)
BUN: 40 mg/dL — ABNORMAL HIGH (ref 8–23)
CO2: 22 mmol/L (ref 22–32)
CO2: 25 mmol/L (ref 22–32)
Calcium: 8.8 mg/dL — ABNORMAL LOW (ref 8.9–10.3)
Calcium: 8.4 mg/dL — ABNORMAL LOW (ref 8.9–10.3)
Chloride: 112 mmol/L — ABNORMAL HIGH (ref 98–111)
Chloride: 108 mmol/L (ref 98–111)
Creatinine, Ser: 0.97 mg/dL (ref 0.44–1.00)
Creatinine, Ser: 1.04 mg/dL — ABNORMAL HIGH (ref 0.44–1.00)
GFR calc Af Amer: >60 mL/min (ref >60)
GFR calc Af Amer: >60 mL/min (ref >60)
GFR calc non Af Amer: >60 mL/min (ref >60)
GFR calc non Af Amer: 56 mL/min — ABNORMAL LOW (ref >60)
Glucose, Bld: 146 mg/dL — ABNORMAL HIGH (ref 70–99)
Glucose, Bld: 127 mg/dL — ABNORMAL HIGH (ref 70–99)
Potassium: 4.5 mmol/L (ref 3.5–5.1)
Potassium: 4.3 mmol/L (ref 3.5–5.1)
Sodium: 143 mmol/L (ref 135–145)
Sodium: 140 mmol/L (ref 135–145)
Total Bilirubin: 0.7 mg/dL (ref 0.3–1.2)
Total Bilirubin: 0.9 mg/dL (ref 0.3–1.2)
Total Protein: 6 g/dL — ABNORMAL LOW (ref 6.5–8.1)
Total Protein: 5.8 g/dL — ABNORMAL LOW (ref 6.5–8.1)

## 2020-04-24 LAB — CBC WITH DIFFERENTIAL/PLATELET
Abs Immature Granulocytes: 0.04 10*3/uL (ref 0.00–0.07)
Basophils Absolute: 0.1 10*3/uL (ref 0.0–0.1)
Basophils Relative: 1 %
Eosinophils Absolute: 0.1 10*3/uL (ref 0.0–0.5)
Eosinophils Relative: 1 %
HCT: 46.1 % — ABNORMAL HIGH (ref 36.0–46.0)
Hemoglobin: 14.6 g/dL (ref 12.0–15.0)
Immature Granulocytes: 0 %
Lymphocytes Relative: 4 %
Lymphs Abs: 0.5 10*3/uL — ABNORMAL LOW (ref 0.7–4.0)
MCH: 28.5 pg (ref 26.0–34.0)
MCHC: 31.7 g/dL (ref 30.0–36.0)
MCV: 89.9 fL (ref 80.0–100.0)
Monocytes Absolute: 0.5 10*3/uL (ref 0.1–1.0)
Monocytes Relative: 4 %
Neutro Abs: 11.6 10*3/uL — ABNORMAL HIGH (ref 1.7–7.7)
Neutrophils Relative %: 90 %
Platelets: 160 10*3/uL (ref 150–400)
RBC: 5.13 MIL/uL — ABNORMAL HIGH (ref 3.87–5.11)
RDW: 18.5 % — ABNORMAL HIGH (ref 11.5–15.5)
WBC: 12.7 10*3/uL — ABNORMAL HIGH (ref 4.0–10.5)
nRBC: 0 % (ref 0.0–0.2)

## 2020-04-24 LAB — URINALYSIS, COMPLETE (UACMP) WITH MICROSCOPIC
Bacteria, UA: NONE SEEN
Bilirubin Urine: NEGATIVE
Glucose, UA: NEGATIVE mg/dL
Hgb urine dipstick: NEGATIVE
Ketones, ur: NEGATIVE mg/dL
Nitrite: NEGATIVE
Protein, ur: 30 mg/dL — AB
Specific Gravity, Urine: 1.025 (ref 1.005–1.030)
WBC, UA: >50 WBC/hpf — ABNORMAL HIGH (ref 0–5)
pH: 5 (ref 5.0–8.0)

## 2020-04-24 LAB — CBC
HCT: 43.1 % (ref 36.0–46.0)
Hemoglobin: 13.5 g/dL (ref 12.0–15.0)
MCH: 28.2 pg (ref 26.0–34.0)
MCHC: 31.3 g/dL (ref 30.0–36.0)
MCV: 90.2 fL (ref 80.0–100.0)
Platelets: 170 10*3/uL (ref 150–400)
RBC: 4.78 MIL/uL (ref 3.87–5.11)
RDW: 18.2 % — ABNORMAL HIGH (ref 11.5–15.5)
WBC: 16.7 10*3/uL — ABNORMAL HIGH (ref 4.0–10.5)
nRBC: 0 % (ref 0.0–0.2)

## 2020-04-24 LAB — MAGNESIUM: Magnesium: 1.8 mg/dL (ref 1.7–2.4)

## 2020-04-24 LAB — LACTIC ACID, PLASMA
Lactic Acid, Venous: 1.5 mmol/L (ref 0.5–1.9)
Lactic Acid, Venous: 2.6 mmol/L (ref 0.5–1.9)
Lactic Acid, Venous: 2.5 mmol/L (ref 0.5–1.9)

## 2020-04-24 LAB — PROTIME-INR
INR: 1 (ref 0.8–1.2)
INR: 1.2 (ref 0.8–1.2)
Prothrombin Time: 13 seconds (ref 11.4–15.2)
Prothrombin Time: 14.9 seconds (ref 11.4–15.2)

## 2020-04-24 LAB — SEDIMENTATION RATE: Sed Rate: 2 mm/hr (ref 0–30)

## 2020-04-24 LAB — CK: Total CK: 25 U/L — ABNORMAL LOW (ref 38–234)

## 2020-04-24 LAB — C-REACTIVE PROTEIN: CRP: NEGATIVE mg/dL (ref <1.0)

## 2020-04-24 LAB — MRSA PCR SCREENING: MRSA by PCR: NEGATIVE

## 2020-04-24 LAB — PROCALCITONIN: Procalcitonin: 7.17 ng/mL

## 2020-04-24 LAB — SARS CORONAVIRUS 2 BY RT PCR (HOSPITAL ORDER, PERFORMED IN ~~LOC~~ HOSPITAL LAB): SARS Coronavirus 2: NEGATIVE

## 2020-04-24 MED ORDER — BISOPROLOL FUMARATE 5 MG PO TABS
5.0000 mg | ORAL_TABLET | Freq: Every day | ORAL | Status: DC
  Administered 2020-04-24: 5 mg via ORAL
  Filled 2020-04-24 (×2): qty 1

## 2020-04-24 MED ORDER — APIXABAN 2.5 MG PO TABS
2.5000 mg | ORAL_TABLET | Freq: Two times a day (BID) | ORAL | Status: DC
  Administered 2020-04-24: 2.5 mg via ORAL
  Filled 2020-04-24: qty 1

## 2020-04-24 MED ORDER — METRONIDAZOLE IN NACL 5-0.79 MG/ML-% IV SOLN
500.0000 mg | Freq: Three times a day (TID) | INTRAVENOUS | Status: DC
  Administered 2020-04-24 – 2020-04-25 (×2): 500 mg via INTRAVENOUS
  Filled 2020-04-24 (×4): qty 100

## 2020-04-24 MED ORDER — LEVOTHYROXINE SODIUM 100 MCG PO TABS
200.0000 ug | ORAL_TABLET | Freq: Every day | ORAL | Status: DC
  Administered 2020-04-26 – 2020-04-29 (×4): 200 ug via ORAL
  Filled 2020-04-24 (×4): qty 2

## 2020-04-24 MED ORDER — IPRATROPIUM BROMIDE 0.02 % IN SOLN: 0.2500 mg | Freq: Four times a day (QID) | RESPIRATORY_TRACT | Status: DC | PRN

## 2020-04-24 MED ORDER — MOMETASONE FURO-FORMOTEROL FUM 200-5 MCG/ACT IN AERO
2.0000 | INHALATION_SPRAY | Freq: Two times a day (BID) | RESPIRATORY_TRACT | Status: DC
  Administered 2020-04-24 – 2020-04-29 (×10): 2 via RESPIRATORY_TRACT
  Filled 2020-04-24: qty 8.8

## 2020-04-24 MED ORDER — ACETAMINOPHEN 650 MG RE SUPP: 650.0000 mg | Freq: Four times a day (QID) | RECTAL | Status: DC | PRN

## 2020-04-24 MED ORDER — TRAZODONE HCL 50 MG PO TABS
150.0000 mg | ORAL_TABLET | Freq: Every day | ORAL | Status: DC
  Administered 2020-04-24 – 2020-04-27 (×4): 150 mg via ORAL
  Filled 2020-04-24 (×3): qty 1
  Filled 2020-04-24: qty 3

## 2020-04-24 MED ORDER — CHLORHEXIDINE GLUCONATE CLOTH 2 % EX PADS
6.0000 | MEDICATED_PAD | Freq: Every day | CUTANEOUS | Status: DC
  Administered 2020-04-24 – 2020-04-29 (×5): 6 via TOPICAL

## 2020-04-24 MED ORDER — POLYETHYLENE GLYCOL 3350 17 G PO PACK: 17.0000 g | PACK | Freq: Every day | ORAL | Status: DC | PRN

## 2020-04-24 MED ORDER — LACTATED RINGERS IV BOLUS: 500.0000 mL | Freq: Once | INTRAVENOUS | Status: DC

## 2020-04-24 MED ORDER — VANCOMYCIN HCL IN DEXTROSE 1-5 GM/200ML-% IV SOLN: 1000.0000 mg | Freq: Once | INTRAVENOUS | Status: DC

## 2020-04-24 MED ORDER — MORPHINE SULFATE (PF) 2 MG/ML IV SOLN
2.0000 mg | INTRAVENOUS | Status: DC | PRN
  Administered 2020-04-24 – 2020-04-28 (×11): 2 mg via INTRAVENOUS
  Filled 2020-04-24 (×12): qty 1

## 2020-04-24 MED ORDER — ACETAMINOPHEN 325 MG PO TABS
650.0000 mg | ORAL_TABLET | Freq: Four times a day (QID) | ORAL | Status: DC | PRN
  Administered 2020-04-25: 650 mg via ORAL
  Filled 2020-04-24: qty 2

## 2020-04-24 MED ORDER — ONDANSETRON HCL 4 MG/2ML IJ SOLN
4.0000 mg | Freq: Four times a day (QID) | INTRAMUSCULAR | Status: DC | PRN
  Administered 2020-04-29: 10:00:00 4 mg via INTRAVENOUS
  Filled 2020-04-24: qty 2

## 2020-04-24 MED ORDER — VITAMIN D 25 MCG (1000 UNIT) PO TABS
2000.0000 [IU] | ORAL_TABLET | Freq: Every day | ORAL | Status: DC
  Administered 2020-04-25 – 2020-04-29 (×5): 2000 [IU] via ORAL
  Filled 2020-04-24 (×5): qty 2

## 2020-04-24 MED ORDER — LACTATED RINGERS IV BOLUS
1000.0000 mL | Freq: Once | INTRAVENOUS | Status: AC
  Administered 2020-04-24: 1000 mL via INTRAVENOUS

## 2020-04-24 MED ORDER — SACUBITRIL-VALSARTAN 24-26 MG PO TABS
1.0000 | ORAL_TABLET | Freq: Two times a day (BID) | ORAL | Status: DC
  Administered 2020-04-24: 1 via ORAL
  Filled 2020-04-24 (×3): qty 1

## 2020-04-24 MED ORDER — FENTANYL CITRATE (PF) 100 MCG/2ML IJ SOLN
50.0000 ug | Freq: Once | INTRAMUSCULAR | Status: AC
  Administered 2020-04-24: 50 ug via INTRAVENOUS
  Filled 2020-04-24: qty 2

## 2020-04-24 MED ORDER — MELATONIN 5 MG PO TABS
5.0000 mg | ORAL_TABLET | Freq: Every day | ORAL | Status: DC
  Administered 2020-04-24 – 2020-04-28 (×5): 5 mg via ORAL
  Filled 2020-04-24 (×6): qty 1

## 2020-04-24 MED ORDER — SODIUM CHLORIDE 0.9 % IV SOLN
1.0000 g | Freq: Three times a day (TID) | INTRAVENOUS | Status: DC
  Administered 2020-04-24 – 2020-04-25 (×2): 1 g via INTRAVENOUS
  Filled 2020-04-24 (×4): qty 1

## 2020-04-24 MED ORDER — LACTATED RINGERS IV BOLUS: 1000.0000 mL | Freq: Once | INTRAVENOUS | Status: DC

## 2020-04-24 MED ORDER — LACTATED RINGERS IV BOLUS
500.0000 mL | Freq: Once | INTRAVENOUS | Status: AC
  Administered 2020-04-24: 500 mL via INTRAVENOUS

## 2020-04-24 MED ORDER — MONTELUKAST SODIUM 10 MG PO TABS
10.0000 mg | ORAL_TABLET | Freq: Every day | ORAL | Status: DC
  Administered 2020-04-24 – 2020-04-28 (×5): 10 mg via ORAL
  Filled 2020-04-24 (×5): qty 1

## 2020-04-24 MED ORDER — FAMOTIDINE 20 MG PO TABS
20.0000 mg | ORAL_TABLET | Freq: Every day | ORAL | Status: DC
  Administered 2020-04-25 – 2020-04-29 (×5): 20 mg via ORAL
  Filled 2020-04-24 (×5): qty 1

## 2020-04-24 MED ORDER — LACTATED RINGERS IV BOLUS: 2000.0000 mL | Freq: Once | INTRAVENOUS | Status: DC

## 2020-04-24 MED ORDER — GUAIFENESIN-DM 100-10 MG/5ML PO SYRP: 5.0000 mL | ORAL_SOLUTION | ORAL | Status: DC | PRN

## 2020-04-24 MED ORDER — ONDANSETRON HCL 4 MG PO TABS: 4.0000 mg | ORAL_TABLET | Freq: Four times a day (QID) | ORAL | Status: DC | PRN

## 2020-04-24 MED ORDER — SODIUM CHLORIDE 0.9 % IV SOLN
1.0000 g | Freq: Once | INTRAVENOUS | Status: AC
  Administered 2020-04-24: 1 g via INTRAVENOUS
  Filled 2020-04-24: qty 1

## 2020-04-24 MED ORDER — ALBUTEROL SULFATE (2.5 MG/3ML) 0.083% IN NEBU: 2.5000 mL | INHALATION_SOLUTION | Freq: Four times a day (QID) | RESPIRATORY_TRACT | Status: DC | PRN

## 2020-04-24 MED ORDER — VANCOMYCIN HCL 1250 MG/250ML IV SOLN
1250.0000 mg | Freq: Two times a day (BID) | INTRAVENOUS | Status: DC
  Administered 2020-04-25: 1250 mg via INTRAVENOUS
  Filled 2020-04-24 (×4): qty 250

## 2020-04-24 MED ORDER — VANCOMYCIN HCL IN DEXTROSE 1-5 GM/200ML-% IV SOLN
1000.0000 mg | Freq: Once | INTRAVENOUS | Status: AC
  Administered 2020-04-24: 1000 mg via INTRAVENOUS
  Filled 2020-04-24: qty 200

## 2020-04-24 MED ORDER — IOHEXOL 350 MG/ML SOLN
100.0000 mL | Freq: Once | INTRAVENOUS | Status: AC | PRN
  Administered 2020-04-24: 100 mL via INTRAVENOUS

## 2020-04-24 NOTE — Progress Notes (Signed)
PHARMACY -  BRIEF ANTIBIOTIC NOTE   Pharmacy has received consult(s) for vancomycin and meropenem from an ED provider.  The patient's profile has been reviewed for ht/wt/allergies/indication/available labs.    One time order(s) placed for vanc 1 g + meropenem 1 g  Further antibiotics/pharmacy consults should be ordered by admitting physician if indicated.                       Thank you,  Pricilla Riffle, PharmD 04/24/2020  2:01 PM

## 2020-04-24 NOTE — ED Notes (Signed)
Pt's husband back at bedside 

## 2020-04-24 NOTE — ED Notes (Signed)
Pt difficult stick, unable to collect 2nd set of Blood cultures. Lab called.

## 2020-04-24 NOTE — Progress Notes (Signed)
At 1155, this RN arrived at the patient's home to provide COVID19 vaccine (2nd dose) as a home visit as patient is bedbound. Upon arrival, the patient was pale, cyanotic around the mouth, shivering and repeating that she felt cold. She was nauseous and vomited bile several times during assessment.Temp 97.6, pulse 92, O2 sats per home pulse oximeter 82%. Wheezing noted in lungs. Pt states her baseline was 89%. On 3L of O2, down from her normal 4L as she "does not want to go to her pulmonologist anymore."   Patient and caregiver (husband) consented to Health Department staff for call to EMS to evaluate (called at 1202). Very emotional, crying and repeatedly explaining that she did not want to go to the hospital. Provided emotional support and remained at patient bedside until [facility] EMS personnel arrived. Of note, patient told this RN that her wound care nurse had visited her an hour before we arrived and said that she would send a message to her doctor about her pain. No mention of concern for patient's wellbeing at that time.   This RN and [facility] Department staff member, Diona Foley, assisted EMS with loading patient on stretcher. Pt departed in ambulance while her husband followed in his car. Health Department emergency documentation form completed.   Vernelle Emerald, RN [facility] Department

## 2020-04-24 NOTE — ED Notes (Signed)
Pt trx to CT.  

## 2020-04-24 NOTE — ED Triage Notes (Signed)
Pt comes EMS from home as a sepsis alert. Pt has been increasingly weak. Pt has decub ulcer and wound nurses noticed a "rotten smell" this morning. Resp 40. Pt had covid in December and sats in low 80s on RA. 6L [place] 91%.

## 2020-04-24 NOTE — ED Notes (Signed)
Placed pt on 8L Non-rebreather to get pt's O2 sats stable in the low 90's

## 2020-04-24 NOTE — Progress Notes (Signed)
Pharmacy Antibiotic Note  Regina White is a 64 y.o. female admitted on 04/24/2020. Pharmacy has been consulted for vancomycin and aztreonam dosing.  Plan: Patient has received vanc 1 g. Will start 1250 mg IV q12h at 2300  Aztreonam 1 g IV q8h  Height: 5\' 5"  (165.1 cm) Weight: 93.4 kg (206 lb) IBW/kg (Calculated) : 57  Temp (24hrs), Avg:98.5 F (36.9 C), Min:98.5 F (36.9 C), Max:98.5 F (36.9 C)  Recent Labs  Lab 04/24/20 1330 04/24/20 1509  WBC 12.7*  --   CREATININE 0.97  --   LATICACIDVEN 1.5 2.6*    Estimated Creatinine Clearance: 65.4 mL/min (by C-G formula based on SCr of 0.97 mg/dL).    Allergies  Allergen Reactions  . Penicillins Anaphylaxis    Breathing problems Did it involve swelling of the face/tongue/throat, SOB, or low BP? Yes Did it involve sudden or severe rash/hives, skin peeling, or any reaction on the inside of your mouth or nose? Unknown Did you need to seek medical attention at a hospital or doctor's office? Unknown When did it last happen?unknown If all above answers are "NO", may proceed with cephalosporin use. Patient tolerated Ceftriaxone September 2020  . Erythromycin Nausea And Vomiting and Rash    STOMACH PAIN  . Levofloxacin Other (See Comments)    Muscle aches  . Lidocaine Rash  . Clindamycin Other (See Comments)    Recurrent C diff  . Levothyroxine Other (See Comments)    Vaginal bleeding Reaction to generic (pt currently takes name brand Synthroid)  . Tiotropium Other (See Comments)    Coughing up blood  . Doxycycline Nausea And Vomiting  . Sulfa Antibiotics Rash    Antimicrobials this admission: Vancomycin 6/17 >> Aztreonam 6/17 >>   Microbiology results: 6/17 BCx: pending 6/17 UCx: pending    Thank you for allowing pharmacy to be a part of this patient's care.  7/17, PharmD 04/24/2020 6:50 PM

## 2020-04-24 NOTE — ED Notes (Signed)
Pt was seen by health department nurse today for covid vaccine and pt was found to be weak and hypotensive. Nurse called EMS.

## 2020-04-24 NOTE — Sepsis Progress Note (Signed)
Dr Erma Heritage aware of fluid resuscitation requirement. Pt will be bolused gently due to CHF and tenuous resp status

## 2020-04-24 NOTE — Progress Notes (Signed)
CODE SEPSIS - PHARMACY COMMUNICATION  **Broad Spectrum Antibiotics should be administered within 1 hour of Sepsis diagnosis**  Time Code Sepsis Called/Page Received: 1355  Antibiotics Ordered: vanc/meropenem  Time of 1st antibiotic administration: 1512  Additional action taken by pharmacy: NA (delay in obtaining blood cultures)  If necessary, Name of Provider/Nurse Contacted: NA    Pricilla Riffle ,PharmD Clinical Pharmacist  04/24/2020  3:22 PM

## 2020-04-24 NOTE — ED Notes (Signed)
Provided pt w/ meal per request w/ EDP permission.

## 2020-04-24 NOTE — H&P (Signed)
History and Physical   Regina White WUG:[id number] DOB: 1955-12-05 DOA: 04/24/2020  Referring MD/NP/PA: Dr. Derrill Kay  PCP: Cain Sieve, MD   Outpatient Specialists: None  Patient coming from: Home  Chief Complaint: Generalized weakness  HPI: Regina White is a 64 y.o. female with medical history significant of COPD on 3 L oxygen at home, diastolic CHF, pulmonary hypertension, asthma, chronic pain is syndrome, pulmonary hypertension, who came to the ER with significant pain weakness and was found to be septic.  She has a wound on the left side of her buttocks.  She was also short of breath.  Pain was at 9 out of 10 with any slight movement.  Patient was noted to have complete redness of her back from the buttocks all the way to her shoulder blade especially on the left side.  Also stage I-II decubitus ulcers.  She also was found to be profoundly hypotensive with sepsis.  Urinalysis suggested UTI.  Patient being admitted therefore with sepsis due to UTI and probably infected decubitus ulcer.  She was getting wound care prior to all this.  Patient has had shortness of breath recently over and above her COPD and currently on nonrebreather back.  No fever no chills no nausea vomiting or diarrhea.  She is being admitted with sepsis therefore..  ED Course: Temperature 98.5, blood pressure 67/45 pulse 78 respiratory rate of 48 oxygen sat 89% on room air and 95% on nonrebreather bag.  White count 16.7.  Hemoglobin and platelets within normal.  BUN 127 creatinine 1.04.  LFTs all within normal.  Lactic acid 2.5 procalcitonin was 7.17.  CT abdomen pelvis showed cholelithiasis without cholecystitis.  Also a large fibroid uterus and moderate fecal retention.  CT angiogram of the chest showed no PE.  Stable dilatation of the main pulmonary artery due to pulmonary arterial hypertension is.  And COPD.  Patient will be admitted for further evaluation and treatment.  Review of Systems: As per HPI  otherwise 10 point review of systems negative.    Past Medical History:  Diagnosis Date  . Asthma   . CHF (congestive heart failure) (HCC)   . Chronic back pain   . COPD (chronic obstructive pulmonary disease) (HCC)   . Pulmonary hypertension (HCC)     Past Surgical History:  Procedure Laterality Date  . BACK SURGERY       reports that she quit smoking about 20 years ago. Her smoking use included cigarettes. She has a 30.00 pack-year smoking history. She has never used smokeless tobacco. She reports that she does not drink alcohol and does not use drugs.  Allergies  Allergen Reactions  . Penicillins Anaphylaxis    Breathing problems Did it involve swelling of the face/tongue/throat, SOB, or low BP? Yes Did it involve sudden or severe rash/hives, skin peeling, or any reaction on the inside of your mouth or nose? Unknown Did you need to seek medical attention at a hospital or doctor's office? Unknown When did it last happen?unknown If all above answers are "NO", may proceed with cephalosporin use. Patient tolerated Ceftriaxone September 2020  . Erythromycin Nausea And Vomiting and Rash    STOMACH PAIN  . Levofloxacin Other (See Comments)    Muscle aches  . Lidocaine Rash  . Clindamycin Other (See Comments)    Recurrent C diff  . Levothyroxine Other (See Comments)    Vaginal bleeding Reaction to generic (pt currently takes name brand Synthroid)  . Tiotropium Other (See Comments)    Coughing  up blood  . Doxycycline Nausea And Vomiting  . Sulfa Antibiotics Rash    Family History  Problem Relation Age of Onset  . Hypertension Mother   . Liver cancer Father      Prior to Admission medications   Medication Sig Start Date End Date Taking? Authorizing Provider  albuterol (VENTOLIN HFA) 108 (90 Base) MCG/ACT inhaler Inhale 2 puffs into the lungs every 6 (six) hours as needed for wheezing or shortness of breath. 10/25/19  Yes Tyrone NineGrunz, Ryan B, MD  apixaban (ELIQUIS) 2.5  MG TABS tablet Take 1 tablet (2.5 mg total) by mouth 2 (two) times daily. 10/25/19  Yes Tyrone NineGrunz, Ryan B, MD  bisoprolol (ZEBETA) 5 MG tablet Take 5 mg by mouth daily. 03/12/20  Yes [provider]  Cholecalciferol (VITAMIN D) 50 MCG (2000 UT) tablet Take 2,000 Units by mouth daily.   Yes [provider]  ENTRESTO 24-26 MG Take 1 tablet by mouth 2 (two) times daily. 04/13/20  Yes [provider]  famotidine (PEPCID) 20 MG tablet Take 1 tablet (20 mg total) by mouth 2 (two) times daily. Patient taking differently: Take 20 mg by mouth daily.  10/25/19  Yes Tyrone NineGrunz, Ryan B, MD  Fluticasone-Salmeterol (ADVAIR) 500-50 MCG/DOSE AEPB Inhale 1 puff into the lungs 2 (two) times daily. 10/25/19  Yes Tyrone NineGrunz, Ryan B, MD  guaiFENesin-dextromethorphan (ROBITUSSIN DM) 100-10 MG/5ML syrup Take 5 mLs by mouth every 4 (four) hours as needed for cough. 10/25/19  Yes Tyrone NineGrunz, Ryan B, MD  ipratropium (ATROVENT) 0.02 % nebulizer solution Inhale into the lungs. 11/23/19 11/22/20 Yes [provider]  levothyroxine (SYNTHROID) 200 MCG tablet Take 1 tablet (200 mcg total) by mouth daily at 6 (six) AM. 10/25/19  Yes Tyrone NineGrunz, Ryan B, MD  montelukast (SINGULAIR) 10 MG tablet Take 1 tablet (10 mg total) by mouth at bedtime. 10/25/19  Yes Tyrone NineGrunz, Ryan B, MD  NON FORMULARY by Intrathecal Infusion route continuous. MORPHINE / BUPIVACAINE INTRATHECAL PUMP    Yes [provider]  polyethylene glycol (MIRALAX / GLYCOLAX) 17 g packet Take 17 g by mouth daily as needed (constipation).   Yes [provider]  traZODone (DESYREL) 50 MG tablet Take 150 mg by mouth at bedtime.  04/17/20  Yes [provider]    Physical Exam: Vitals:   04/24/20 1830 04/24/20 1900 04/24/20 1930 04/24/20 2000  BP: (!) 110/91 (!) 107/49 (!) 119/56 (!) 123/39  Pulse: (!) 32 75 75 75  Resp: (!) 27 17 (!) 36 (!) 38  Temp:      TempSrc:      SpO2: (!) 89% 93% 91% 95%  Weight:      Height:           Constitutional: Acutely ill looking, in mild distress due to pain Vitals:   04/24/20 1830 04/24/20 1900 04/24/20 1930 04/24/20 2000  BP: (!) 110/91 (!) 107/49 (!) 119/56 (!) 123/39  Pulse: (!) 32 75 75 75  Resp: (!) 27 17 (!) 36 (!) 38  Temp:      TempSrc:      SpO2: (!) 89% 93% 91% 95%  Weight:      Height:       Eyes: PERRL, lids and conjunctivae normal ENMT: Mucous membranes are dry. Posterior pharynx clear of any exudate or lesions.Normal dentition.  Neck: normal, supple, no masses, no thyromegaly Respiratory: clear to auscultation bilaterally, no wheezing, no crackles.  Rhonchi, normal respiratory effort. No accessory muscle use.  Cardiovascular: Bradycardia, no murmurs / rubs /  gallops. No extremity edema. 2+ pedal pulses. No carotid bruits.  Abdomen: no tenderness, no masses palpated. No hepatosplenomegaly. Bowel sounds positive.  Musculoskeletal: no clubbing / cyanosis. No joint deformity upper and lower extremities. Good ROM, no contractures. Normal muscle tone.  Skin: Infected decubitus ulcer of the sacral region, redness of the left side of the buttocks and proximal as well as mid back.  Stage I-II decubitus ulcers in the lower back Neurologic: CN 2-12 grossly intact. Sensation intact, DTR normal. Strength 5/5 in all 4.  Psychiatric: Normal judgment and insight. Alert and oriented x 3. Normal mood.     Labs on Admission: I have personally reviewed following labs and imaging studies  CBC: Recent Labs  Lab 04/24/20 1330 04/24/20 2219  WBC 12.7* 16.7*  NEUTROABS 11.6*  --   HGB 14.6 13.5  HCT 46.1* 43.1  MCV 89.9 90.2  PLT 160 170   Basic Metabolic Panel: Recent Labs  Lab 04/24/20 1330 04/24/20 2219  NA 143 140  K 4.5 4.3  CL 112* 108  CO2 22 25  GLUCOSE 146* 127*  BUN 40* 40*  CREATININE 0.97 1.04*  CALCIUM 8.8* 8.4*   GFR: Estimated Creatinine Clearance: 61 mL/min (A) (by C-G formula based on SCr of 1.04 mg/dL (H)). Liver Function  Tests: Recent Labs  Lab 04/24/20 1330 04/24/20 2219  AST 12* 14*  ALT 12 12  ALKPHOS 58 51  BILITOT 0.7 0.9  PROT 6.0* 5.8*  ALBUMIN 3.3* 3.1*   No results for input(s): LIPASE, AMYLASE in the last 168 hours. No results for input(s): AMMONIA in the last 168 hours. Coagulation Profile: Recent Labs  Lab 04/24/20 1330 04/24/20 2219  INR 1.0 1.2   Cardiac Enzymes: Recent Labs  Lab 04/24/20 1330  CKTOTAL 25*   BNP (last 3 results) No results for input(s): PROBNP in the last 8760 hours. HbA1C: No results for input(s): HGBA1C in the last 72 hours. CBG: No results for input(s): GLUCAP in the last 168 hours. Lipid Profile: No results for input(s): CHOL, HDL, LDLCALC, TRIG, CHOLHDL, LDLDIRECT in the last 72 hours. Thyroid Function Tests: No results for input(s): TSH, T4TOTAL, FREET4, T3FREE, THYROIDAB in the last 72 hours. Anemia Panel: No results for input(s): VITAMINB12, FOLATE, FERRITIN, TIBC, IRON, RETICCTPCT in the last 72 hours. Urine analysis:    Component Value Date/Time   COLORURINE YELLOW (A) 04/24/2020 1538   APPEARANCEUR HAZY (A) 04/24/2020 1538   LABSPEC 1.025 04/24/2020 1538   PHURINE 5.0 04/24/2020 1538   GLUCOSEU NEGATIVE 04/24/2020 1538   HGBUR NEGATIVE 04/24/2020 1538   BILIRUBINUR NEGATIVE 04/24/2020 1538   KETONESUR NEGATIVE 04/24/2020 1538   PROTEINUR 30 (A) 04/24/2020 1538   NITRITE NEGATIVE 04/24/2020 1538   LEUKOCYTESUR LARGE (A) 04/24/2020 1538   Sepsis Labs: @LABRCNTIP (procalcitonin:4,lacticidven:4) ) Recent Results (from the past 240 hour(s))  SARS Coronavirus 2 by RT PCR (hospital order, performed in [facility] hospital lab) Nasopharyngeal Nasopharyngeal Swab     Status: None   Collection Time: 04/24/20  3:38 PM   Specimen: Nasopharyngeal Swab  Result Value Ref Range Status   SARS Coronavirus 2 NEGATIVE NEGATIVE Final    Comment: (NOTE) SARS-CoV-2 target nucleic acids are NOT DETECTED.  The SARS-CoV-2 RNA is generally detectable in  upper and lower respiratory specimens during the acute phase of infection. The lowest concentration of SARS-CoV-2 viral copies this assay can detect is 250 copies / mL. A negative result does not preclude SARS-CoV-2 infection and should not be used as the sole basis  for treatment or other patient management decisions.  A negative result may occur with improper specimen collection / handling, submission of specimen other than nasopharyngeal swab, presence of viral mutation(s) within the areas targeted by this assay, and inadequate number of viral copies (<250 copies / mL). A negative result must be combined with clinical observations, patient history, and epidemiological information.  Fact Sheet for Patients:   StrictlyIdeas.no  Fact Sheet for Healthcare Providers: BankingDealers.co.za  This test is not yet approved or  cleared by the [place] FDA and has been authorized for detection and/or diagnosis of SARS-CoV-2 by FDA under an Emergency Use Authorization (EUA).  This EUA will remain in effect (meaning this test can be used) for the duration of the COVID-19 declaration under Section 564(b)(1) of the Act, 21 U.S.C. section 360bbb-3(b)(1), unless the authorization is terminated or revoked sooner.  Performed at [facility], [address] [place], [place] [address]   MRSA PCR Screening     Status: None   Collection Time: 04/24/20  8:27 PM   Specimen: Nasopharyngeal  Result Value Ref Range Status   MRSA by PCR NEGATIVE NEGATIVE Final    Comment:        The GeneXpert MRSA Assay (FDA approved for NASAL specimens only), is one component of a comprehensive MRSA colonization surveillance program. It is not intended to diagnose MRSA infection nor to guide or monitor treatment for MRSA infections. Performed at [facility], [address] [place], [place] [address]      Radiological Exams on  Admission: CT Angio Chest PE W and/or Wo Contrast  Result Date: 04/24/2020 CLINICAL DATA:  Sepsis, weakness, shortness of breath, hypoxia EXAM: CT ANGIOGRAPHY CHEST WITH CONTRAST TECHNIQUE: Multidetector CT imaging of the chest was performed using the standard protocol during bolus administration of intravenous contrast. Multiplanar CT image reconstructions and MIPs were obtained to evaluate the vascular anatomy. CONTRAST:  142mL OMNIPAQUE IOHEXOL 350 MG/ML SOLN COMPARISON:  06/02/2018 FINDINGS: Cardiovascular: This is a technically adequate evaluation of the pulmonary vasculature. There are no filling defects or pulmonary emboli. Marked enlargement of the main pulmonary artery is unchanged, consistent with pulmonary arterial hypertension. Stable cardiomegaly, with prominent right ventricular dilatation. Normal caliber of the thoracic aorta. Mediastinum/Nodes: No enlarged mediastinal, hilar, or axillary lymph nodes. Thyroid gland, trachea, and esophagus demonstrate no significant findings. Lungs/Pleura: Diffuse background emphysema unchanged. Scattered hypoventilatory changes are seen within the dependent lower lobes. No airspace disease, effusion, or pneumothorax. Central airways are patent. Upper Abdomen: Reflux of contrast into the hepatic veins consistent with cardiac dysfunction. Please refer to dedicated CT abdomen and pelvis report. Musculoskeletal: There are no acute or destructive bony lesions. Spinal stimulator is seen within the thoracic central canal, lead at the T9 level. Reconstructed images demonstrate no additional findings. Review of the MIP images confirms the above findings. IMPRESSION: 1. No evidence of pulmonary embolus. 2. Stable dilatation of the main pulmonary artery consistent with pulmonary arterial hypertension. 3. Marked cardiomegaly and right ventricular dilatation, with reflux of contrast into the hepatic veins compatible with cardiac dysfunction. 4.  Emphysema (ICD10-J43.9).  Electronically Signed   By: Randa Ngo M.D.   On: 04/24/2020 17:40   CT ABDOMEN PELVIS W CONTRAST  Result Date: 04/24/2020 CLINICAL DATA:  Nausea and vomiting, weakness, decubitus ulcer EXAM: CT ABDOMEN AND PELVIS WITH CONTRAST TECHNIQUE: Multidetector CT imaging of the abdomen and pelvis was performed using the standard protocol following bolus administration of intravenous contrast. CONTRAST:  148mL OMNIPAQUE IOHEXOL 350 MG/ML  SOLN COMPARISON:  None. FINDINGS: Lower chest: Hypoventilatory changes are seen at the lung bases. The heart is enlarged. Please refer to dedicated chest CT report. Hepatobiliary: Dependent calcified gallstone without cholecystitis. The liver is unremarkable. Pancreas: Unremarkable. No pancreatic ductal dilatation or surrounding inflammatory changes. Spleen: Normal in size without focal abnormality. Adrenals/Urinary Tract: Evaluation of the kidneys limited due to streak artifact from orthopedic hardware. There are multiple areas of bilateral renal cortical scarring. No evidence of hydronephrosis. The adrenals are grossly unremarkable. Bladder is normal. Stomach/Bowel: No bowel obstruction or ileus. Moderate retained stool throughout the colon. Normal appendix right lower quadrant. No bowel wall thickening or inflammatory change. Vascular/Lymphatic: Venous stent identified in the left external iliac and common iliac veins. No significant atherosclerosis. No pathologic adenopathy. Reproductive: Uterus is enlarged, with a dominant intramural fibroid measuring 7.2 x 6.7 by 6.0 cm. No adnexal masses. Other: No free fluid or free gas. No abdominal wall hernia. Musculoskeletal: Extensive postsurgical changes are seen from multilevel lumbar fusion. Spinal stimulator is seen entering the central canal at approximately L2, lead extending superiorly in the thoracic central canal. There are no acute bony abnormalities. Severe bilateral hip osteoarthritis is noted, left greater than right.  Reconstructed images demonstrate no additional findings. IMPRESSION: 1. Cholelithiasis without cholecystitis. 2. Enlarged fibroid uterus. 3. Moderate fecal retention. Electronically Signed   By: Sharlet Salina M.D.   On: 04/24/2020 17:45   DG Chest Port 1 View  Result Date: 04/24/2020 CLINICAL DATA:  Sepsis. EXAM: PORTABLE CHEST 1 VIEW COMPARISON:  October 19, 2019. FINDINGS: Stable cardiomegaly. No pneumothorax or pleural effusion is noted. Stable small interstitial densities are noted throughout both lungs which may represent scarring, although superimposed acute inflammation or edema cannot be excluded. Bony thorax is unremarkable. IMPRESSION: Stable small interstitial densities are noted throughout both lungs which may represent scarring, although superimposed acute inflammation or edema cannot be excluded. Electronically Signed   By: Lupita Raider M.D.   On: 04/24/2020 14:36    EKG: Independently reviewed.  It shows sinus rhythm with multiple RBB and LBBB.  Assessment/Plan Principal Problem:   Sepsis secondary to UTI [facility]) Active Problems:   Chronic pain   Heart failure with preserved ejection fraction (HCC)   Hypertension, benign   Hypothyroidism   Acute respiratory failure with hypoxia (HCC)   Hyponatremia   Sepsis (HCC)     #1 sepsis secondary to UTI: Also possible infected decubitus ulcer.  Patient will be admitted and treated with the sepsis protocol.  Aggressive fluids resuscitation.  IV vancomycin cefepime and metronidazole.  Continue treatment as such and follow blood cultures and other cultures.  Also urine cultures.  #2 acute on chronic respiratory failure with hypoxia: Continue oxygenation antibiotics and breathing treatment.  #3 chronic pain syndrome: Resume home regimen and continue pain medicine.  #4 diastolic dysfunction CHF: Continue home regimen when stable.  #5 hypothyroidism: Continue levothyroxine  #6 hypertension: Continue home regimen of blood pressure  medicine when tolerated.  She was hypotensive so we will hold for now.  #7 hyponatremia: Probably dehydration.  Hydrate and follow sodium level.   DVT prophylaxis: Lovenox Code Status: Full code Family Communication: No family at bedside Disposition Plan: Home Consults called: None Admission status: Inpatient to stepdown  Severity of Illness: The appropriate patient status for this patient is INPATIENT. Inpatient status is judged to be reasonable and necessary in order to provide the required intensity of service to ensure the patient's safety. The patient's presenting symptoms, physical exam findings, and  initial radiographic and laboratory data in the context of their chronic comorbidities is felt to place them at high risk for further clinical deterioration. Furthermore, it is not anticipated that the patient will be medically stable for discharge from the hospital within 2 midnights of admission. The following factors support the patient status of inpatient.   " The patient's presenting symptoms include generalized weakness and confusion. " The worrisome physical exam findings include decubitus ulcers with redness of the back. " The initial radiographic and laboratory data are worrisome because of leukocytosis. " The chronic co-morbidities include decubitus ulcers.   * I certify that at the point of admission it is my clinical judgment that the patient will require inpatient hospital care spanning beyond 2 midnights from the point of admission due to high intensity of service, high risk for further deterioration and high frequency of surveillance required.Lonia Blood MD [facility] Pager [phone]  If 7PM-7AM, please contact night-coverage [email or web address] Password [facility]  04/24/2020, 11:14 PM

## 2020-04-24 NOTE — Sepsis Progress Note (Signed)
Notified bedside nurse of need to draw a repeat lactic acid after fluid boluses in, to see if interventions are helping.

## 2020-04-24 NOTE — ED Notes (Signed)
Updated pt's daughter, Victorino Dike.

## 2020-04-24 NOTE — ED Provider Notes (Signed)
[facility] Emergency Department Provider Note  ____________________________________________   First MD Initiated Contact with Patient 04/24/20 1350     (approximate)  I have reviewed the triage vital signs and the nursing notes.   HISTORY  Chief Complaint Weakness    HPI Regina White is a 64 y.o. female  with extensive PMHx of COPD on chronic 3L [place], CHF, chronic pain, pulmonary HTN, asthma, here with weakness, pain from her left side wound, and shortness of breath. Pt was sent here after her home health nurse came out to see her today, noticed that she was more SOB and ill appearing. She reporteldy has been getting home wound care for a decub ulcer although according to the patient, she has not had recent care and has been essentially immobile for the past month 2/2 hip pain. She states she has daily but worsening right back and leg pain, along with severe pain with any movement. She has had worsening fatigue, chills, and malaise over the past 1-[redacted] weeks along with this, as well as decreased appetite. More recently, she has also felt more SOB, though she states she always feels SOB 2/2 her COPD. No increased cough or sputum production.        Past Medical History:  Diagnosis Date  . Asthma   . CHF (congestive heart failure) ([place])   . Chronic back pain   . COPD (chronic obstructive pulmonary disease) ([place])   . Pulmonary hypertension [facility])     Patient Active Problem List   Diagnosis Date Noted  . Sepsis secondary to UTI ([place]) 04/24/2020  . Pressure injury of skin 10/23/2019  . COVID-19 virus infection 10/19/2019  . Hyponatremia 10/19/2019  . Hypovolemic shock ([place]) 07/22/2019  . Acute respiratory failure with hypoxemia ([place]) 12/04/2018  . Acute respiratory failure with hypoxia ([place]) 06/02/2018  . Diarrhea of presumed infectious origin 02/17/2016  . Right acute serous otitis media 02/17/2016  . COPD (chronic obstructive pulmonary disease) ([place])  12/23/2015  . Heart failure with preserved ejection fraction ([place]) 12/23/2015  . DDD (degenerative disc disease) 09/25/2013  . Narrowing of intervertebral disc space 09/25/2013  . OSA (obstructive sleep apnea) 07/25/2013  . Deep vein thrombosis (DVT) ([place]) 06/25/2013  . Erythema 06/18/2013  . Left leg pain 06/18/2013  . Mediastinal lymphadenopathy 06/18/2013  . DVT, lower extremity ([place]) 06/14/2013  . Pulmonary hypertension ([place]) 06/14/2013  . Asthma 09/21/2010  . Chronic pain 09/21/2010  . Hypertension, benign 09/21/2010  . Hypothyroidism 09/21/2010  . Venous stasis 09/21/2010    Past Surgical History:  Procedure Laterality Date  . BACK SURGERY      Prior to Admission medications   Medication Sig Start Date End Date Taking? Authorizing Provider  albuterol (VENTOLIN HFA) 108 (90 Base) MCG/ACT inhaler Inhale 2 puffs into the lungs every 6 (six) hours as needed for wheezing or shortness of breath. 10/25/19  Yes Patrecia Pour, MD  apixaban (ELIQUIS) 2.5 MG TABS tablet Take 1 tablet (2.5 mg total) by mouth 2 (two) times daily. 10/25/19  Yes Patrecia Pour, MD  bisoprolol (ZEBETA) 5 MG tablet Take 5 mg by mouth daily. 03/12/20  Yes [provider]  Cholecalciferol (VITAMIN D) 50 MCG (2000 UT) tablet Take 2,000 Units by mouth daily.   Yes [provider]  ENTRESTO 24-26 MG Take 1 tablet by mouth 2 (two) times daily. 04/13/20  Yes [provider]  famotidine (PEPCID) 20 MG tablet Take 1 tablet (20 mg total) by mouth 2 (  two) times daily. Patient taking differently: Take 20 mg by mouth daily.  10/25/19  Yes Tyrone Nine, MD  Fluticasone-Salmeterol (ADVAIR) 500-50 MCG/DOSE AEPB Inhale 1 puff into the lungs 2 (two) times daily. 10/25/19  Yes Tyrone Nine, MD  guaiFENesin-dextromethorphan (ROBITUSSIN DM) 100-10 MG/5ML syrup Take 5 mLs by mouth every 4 (four) hours as needed for cough. 10/25/19  Yes Tyrone Nine, MD  ipratropium (ATROVENT) 0.02 % nebulizer solution  Inhale into the lungs. 11/23/19 11/22/20 Yes [provider]  levothyroxine (SYNTHROID) 200 MCG tablet Take 1 tablet (200 mcg total) by mouth daily at 6 (six) AM. 10/25/19  Yes Tyrone Nine, MD  montelukast (SINGULAIR) 10 MG tablet Take 1 tablet (10 mg total) by mouth at bedtime. 10/25/19  Yes Tyrone Nine, MD  NON FORMULARY by Intrathecal Infusion route continuous. MORPHINE / BUPIVACAINE INTRATHECAL PUMP    Yes [provider]  polyethylene glycol (MIRALAX / GLYCOLAX) 17 g packet Take 17 g by mouth daily as needed (constipation).   Yes [provider]  traZODone (DESYREL) 50 MG tablet Take 150 mg by mouth at bedtime.  04/17/20  Yes [provider]    Allergies Penicillins, Erythromycin, Levofloxacin, Lidocaine, Clindamycin, Levothyroxine, Tiotropium, Doxycycline, and Sulfa antibiotics  Family History  Problem Relation Age of Onset  . Hypertension Mother   . Liver cancer Father     Social History Social History   Tobacco Use  . Smoking status: Former Smoker    Packs/day: 1.00    Years: 30.00    Pack years: 30.00    Types: Cigarettes    Quit date: 06/09/1999    Years since quitting: 20.8  . Smokeless tobacco: Never Used  Vaping Use  . Vaping Use: Never used  Substance Use Topics  . Alcohol use: No  . Drug use: Never    Review of Systems  Review of Systems  Constitutional: Positive for fatigue. Negative for fever.  HENT: Negative for congestion and sore throat.   Eyes: Negative for visual disturbance.  Respiratory: Positive for cough and shortness of breath.   Cardiovascular: Negative for chest pain.  Gastrointestinal: Positive for nausea. Negative for abdominal pain, diarrhea and vomiting.  Genitourinary: Negative for flank pain.  Musculoskeletal: Negative for back pain and neck pain.  Skin: Positive for rash and wound.  Neurological: Positive for weakness.  All other systems reviewed and are negative.     ____________________________________________  PHYSICAL EXAM:      VITAL SIGNS: ED Triage Vitals  Enc Vitals Group     BP 04/24/20 1320 (!) 67/45     Pulse Rate 04/24/20 1320 71     Resp 04/24/20 1320 (!) 40     Temp 04/24/20 1330 98.5 F (36.9 C)     Temp Source 04/24/20 1330 Oral     SpO2 04/24/20 1320 95 %     Weight 04/24/20 1328 206 lb (93.4 kg)     Height 04/24/20 1328 5\' 5"  (1.651 m)     Head Circumference --      Peak Flow --      Pain Score --      Pain Loc --      Pain Edu? --      Excl. in GC? --      Physical Exam Vitals and nursing note reviewed.  Constitutional:      General: She is not in acute distress.    Appearance: She is well-developed. She is ill-appearing.  HENT:  Head: Normocephalic and atraumatic.     Mouth/Throat:     Mouth: Mucous membranes are dry.  Eyes:     Conjunctiva/sclera: Conjunctivae normal.  Cardiovascular:     Rate and Rhythm: Regular rhythm. Tachycardia present.     Heart sounds: Normal heart sounds. No murmur heard.  No friction rub.  Pulmonary:     Breath sounds: Wheezing and rales present.  Abdominal:     General: There is no distension.     Palpations: Abdomen is soft.     Tenderness: There is no abdominal tenderness.  Musculoskeletal:     Cervical back: Neck supple.  Skin:    General: Skin is warm.     Capillary Refill: Capillary refill takes less than 2 seconds.     Comments: Diffuse, severe pressure injury/wounds to left buttocks and left flank/back, with superficial areas of ulceration and tenderness with warmth. Slight odor. No deep or tunneling wounds.  Neurological:     Mental Status: She is alert and oriented to person, place, and time.     Motor: No abnormal muscle tone.         ____________________________________________   LABS (all labs ordered are listed, but only abnormal results are displayed)  Labs Reviewed  COMPREHENSIVE METABOLIC PANEL - Abnormal; Notable for the following components:       Result Value   Chloride 112 (*)    Glucose, Bld 146 (*)    BUN 40 (*)    Calcium 8.8 (*)    Total Protein 6.0 (*)    Albumin 3.3 (*)    AST 12 (*)    All other components within normal limits  LACTIC ACID, PLASMA - Abnormal; Notable for the following components:   Lactic Acid, Venous 2.6 (*)    All other components within normal limits  CBC WITH DIFFERENTIAL/PLATELET - Abnormal; Notable for the following components:   WBC 12.7 (*)    RBC 5.13 (*)    HCT 46.1 (*)    RDW 18.5 (*)    Neutro Abs 11.6 (*)    Lymphs Abs 0.5 (*)    All other components within normal limits  URINALYSIS, COMPLETE (UACMP) WITH MICROSCOPIC - Abnormal; Notable for the following components:   Color, Urine YELLOW (*)    APPearance HAZY (*)    Protein, ur 30 (*)    Leukocytes,Ua LARGE (*)    WBC, UA >50 (*)    Non Squamous Epithelial PRESENT (*)    All other components within normal limits  SARS CORONAVIRUS 2 BY RT PCR (HOSPITAL ORDER, PERFORMED IN [facility])  CULTURE, BLOOD (ROUTINE X 2)  CULTURE, BLOOD (ROUTINE X 2)  URINE CULTURE  LACTIC ACID, PLASMA  PROTIME-INR  SEDIMENTATION RATE  C-REACTIVE PROTEIN  CREATININE, SERUM  POC SARS CORONAVIRUS 2 AG -  ED    ____________________________________________  EKG:  ________________________________________  RADIOLOGY All imaging, including plain films, CT scans, and ultrasounds, independently reviewed by me, and interpretations confirmed via formal radiology reads.  ED MD interpretation:   CXR: Stable small interstitial densities  Official radiology report(s): CT Angio Chest PE W and/or Wo Contrast  Result Date: 04/24/2020 CLINICAL DATA:  Sepsis, weakness, shortness of breath, hypoxia EXAM: CT ANGIOGRAPHY CHEST WITH CONTRAST TECHNIQUE: Multidetector CT imaging of the chest was performed using the standard protocol during bolus administration of intravenous contrast. Multiplanar CT image reconstructions and MIPs were obtained to  evaluate the vascular anatomy. CONTRAST:  OMNIPAQUE IOHEXOL 350 MG/ML SOLN COMPARISON:  06/02/2018  FINDINGS: Cardiovascular: This is a technically adequate evaluation of the pulmonary vasculature. There are no filling defects or pulmonary emboli. Marked enlargement of the main pulmonary artery is unchanged, consistent with pulmonary arterial hypertension. Stable cardiomegaly, with prominent right ventricular dilatation. Normal caliber of the thoracic aorta. Mediastinum/Nodes: No enlarged mediastinal, hilar, or axillary lymph nodes. Thyroid gland, trachea, and esophagus demonstrate no significant findings. Lungs/Pleura: Diffuse background emphysema unchanged. Scattered hypoventilatory changes are seen within the dependent lower lobes. No airspace disease, effusion, or pneumothorax. Central airways are patent. Upper Abdomen: Reflux of contrast into the hepatic veins consistent with cardiac dysfunction. Please refer to dedicated CT abdomen and pelvis report. Musculoskeletal: There are no acute or destructive bony lesions. Spinal stimulator is seen within the thoracic central canal, lead at the T9 level. Reconstructed images demonstrate no additional findings. Review of the MIP images confirms the above findings. IMPRESSION: 1. No evidence of pulmonary embolus. 2. Stable dilatation of the main pulmonary artery consistent with pulmonary arterial hypertension. 3. Marked cardiomegaly and right ventricular dilatation, with reflux of contrast into the hepatic veins compatible with cardiac dysfunction. 4.  Emphysema (ICD10-J43.9). Electronically Signed   By: Sharlet Salina M.D.   On: 04/24/2020 17:40   CT ABDOMEN PELVIS W CONTRAST  Result Date: 04/24/2020 CLINICAL DATA:  Nausea and vomiting, weakness, decubitus ulcer EXAM: CT ABDOMEN AND PELVIS WITH CONTRAST TECHNIQUE: Multidetector CT imaging of the abdomen and pelvis was performed using the standard protocol following bolus administration of intravenous contrast.  CONTRAST:  OMNIPAQUE IOHEXOL 350 MG/ML SOLN COMPARISON:  None. FINDINGS: Lower chest: Hypoventilatory changes are seen at the lung bases. The heart is enlarged. Please refer to dedicated chest CT report. Hepatobiliary: Dependent calcified gallstone without cholecystitis. The liver is unremarkable. Pancreas: Unremarkable. No pancreatic ductal dilatation or surrounding inflammatory changes. Spleen: Normal in size without focal abnormality. Adrenals/Urinary Tract: Evaluation of the kidneys limited due to streak artifact from orthopedic hardware. There are multiple areas of bilateral renal cortical scarring. No evidence of hydronephrosis. The adrenals are grossly unremarkable. Bladder is normal. Stomach/Bowel: No bowel obstruction or ileus. Moderate retained stool throughout the colon. Normal appendix right lower quadrant. No bowel wall thickening or inflammatory change. Vascular/Lymphatic: Venous stent identified in the left external iliac and common iliac veins. No significant atherosclerosis. No pathologic adenopathy. Reproductive: Uterus is enlarged, with a dominant intramural fibroid measuring 7.2 x 6.7 by 6.0 cm. No adnexal masses. Other: No free fluid or free gas. No abdominal wall hernia. Musculoskeletal: Extensive postsurgical changes are seen from multilevel lumbar fusion. Spinal stimulator is seen entering the central canal at approximately L2, lead extending superiorly in the thoracic central canal. There are no acute bony abnormalities. Severe bilateral hip osteoarthritis is noted, left greater than right. Reconstructed images demonstrate no additional findings. IMPRESSION: 1. Cholelithiasis without cholecystitis. 2. Enlarged fibroid uterus. 3. Moderate fecal retention. Electronically Signed   By: Sharlet Salina M.D.   On: 04/24/2020 17:45   DG Chest Port 1 View  Result Date: 04/24/2020 CLINICAL DATA:  Sepsis. EXAM: PORTABLE CHEST 1 VIEW COMPARISON:  October 19, 2019. FINDINGS: Stable  cardiomegaly. No pneumothorax or pleural effusion is noted. Stable small interstitial densities are noted throughout both lungs which may represent scarring, although superimposed acute inflammation or edema cannot be excluded. Bony thorax is unremarkable. IMPRESSION: Stable small interstitial densities are noted throughout both lungs which may represent scarring, although superimposed acute inflammation or edema cannot be excluded. Electronically Signed   By: Lupita Raider M.D.   On:  04/24/2020 14:36    ____________________________________________  PROCEDURES   Procedure(s) performed (including Critical Care):  .Critical Care Performed by: Shaune Pollack, MD Authorized by: Shaune Pollack, MD   Critical care provider statement:    Critical care time (minutes):  35   Critical care time was exclusive of:  Separately billable procedures and treating other patients and teaching time   Critical care was necessary to treat or prevent imminent or life-threatening deterioration of the following conditions:  Cardiac failure, circulatory failure and sepsis   Critical care was time spent personally by me on the following activities:  Development of treatment plan with patient or surrogate, discussions with consultants, evaluation of patient's response to treatment, examination of patient, obtaining history from patient or surrogate, ordering and performing treatments and interventions, ordering and review of laboratory studies, ordering and review of radiographic studies, pulse oximetry, re-evaluation of patient's condition and review of old charts   I assumed direction of critical care for this patient from another provider in my specialty: no      ____________________________________________  INITIAL IMPRESSION / MDM / ASSESSMENT AND PLAN / ED COURSE  As part of my medical decision making, I reviewed the following data within the electronic MEDICAL RECORD NUMBER Nursing notes reviewed and  incorporated, Old chart reviewed, Notes from prior ED visits, and [place] Controlled Substance Database       *Carmela N Rico was evaluated in Emergency Department on 04/24/2020 for the symptoms described in the history of present illness. She was evaluated in the context of the global COVID-19 pandemic, which necessitated consideration that the patient might be at risk for infection with the SARS-CoV-2 virus that causes COVID-19. Institutional protocols and algorithms that pertain to the evaluation of patients at risk for COVID-19 are in a state of rapid change based on information released by regulatory bodies including the CDC and federal and state organizations. These policies and algorithms were followed during the patient's care in the ED.  Some ED evaluations and interventions may be delayed as a result of limited staffing during the pandemic.*     Medical Decision Making:  64 yo F here with tachycardia, hypotension, and rash. Labs show moderate leukocytosis, lactic acidosis, and dehydration. Imaging as above. On exam, pt has severe pressure wounds and ulcerations to her entire left body, with possible superimposed cellulitis. She also has reported cough, h/o PE, and abdominal pain. Given the severity of her presentation and illness, will check CT Angio as well as A/P. Otherwise, pt initiated as sepsis protocol with  Broad-spectrum ABX. Will be very cautious with fluids as she has a h/o CHF and already has some edema on XR, to prevent resp failure.,   ____________________________________________  FINAL CLINICAL IMPRESSION(S) / ED DIAGNOSES  Final diagnoses:  Sepsis without acute organ dysfunction, due to unspecified organism (HCC)  Acute on chronic respiratory failure with hypoxemia (HCC)     MEDICATIONS GIVEN DURING THIS VISIT:  Medications  aztreonam (AZACTAM) 1 g in sodium chloride 0.9 % 100 mL IVPB (has no administration in time range)  vancomycin (VANCOREADY) IVPB 1250 mg/250 mL  (has no administration in time range)  vancomycin (VANCOCIN) IVPB 1000 mg/200 mL premix (0 mg Intravenous Stopped 04/24/20 1647)  meropenem (MERREM) 1 g in sodium chloride 0.9 % 100 mL IVPB (0 g Intravenous Stopped 04/24/20 1542)  lactated ringers bolus 1,000 mL (0 mLs Intravenous Stopped 04/24/20 1510)  fentaNYL (SUBLIMAZE) injection 50 mcg (50 mcg Intravenous Given 04/24/20 1510)  iohexol (OMNIPAQUE) 350 MG/ML injection  100 mL (100 mLs Intravenous Contrast Given 04/24/20 1703)  lactated ringers bolus 500 mL (500 mLs Intravenous New Bag/Given 04/24/20 1633)     ED Discharge Orders    None       Note:  This document was prepared using Dragon voice recognition software and may include unintentional dictation errors.   Shaune PollackIsaacs, Roselyne Stalnaker, MD 04/24/20 [phone]

## 2020-04-25 ENCOUNTER — Inpatient Hospital Stay (HOSPITAL_COMMUNITY)
Admit: 2020-04-25 | Discharge: 2020-04-25 | Disposition: A | Discharge status: Home / Self Care | Payer: Medicare Other | Attending: Acute Care | Admitting: Acute Care

## 2020-04-25 ENCOUNTER — Encounter: Payer: Self-pay | Admitting: Internal Medicine

## 2020-04-25 ENCOUNTER — Inpatient Hospital Stay: Payer: Medicare Other

## 2020-04-25 DIAGNOSIS — J9621 Acute and chronic respiratory failure with hypoxia: Secondary | ICD-10-CM

## 2020-04-25 DIAGNOSIS — A401 Sepsis due to streptococcus, group B: Principal | ICD-10-CM

## 2020-04-25 DIAGNOSIS — I959 Hypotension, unspecified: Secondary | ICD-10-CM

## 2020-04-25 DIAGNOSIS — E871 Hypo-osmolality and hyponatremia: Secondary | ICD-10-CM

## 2020-04-25 DIAGNOSIS — I5021 Acute systolic (congestive) heart failure: Secondary | ICD-10-CM

## 2020-04-25 DIAGNOSIS — I5023 Acute on chronic systolic (congestive) heart failure: Secondary | ICD-10-CM

## 2020-04-25 DIAGNOSIS — I5043 Acute on chronic combined systolic (congestive) and diastolic (congestive) heart failure: Secondary | ICD-10-CM

## 2020-04-25 DIAGNOSIS — I272 Pulmonary hypertension, unspecified: Secondary | ICD-10-CM

## 2020-04-25 DIAGNOSIS — J9601 Acute respiratory failure with hypoxia: Secondary | ICD-10-CM

## 2020-04-25 DIAGNOSIS — G8929 Other chronic pain: Secondary | ICD-10-CM

## 2020-04-25 DIAGNOSIS — E039 Hypothyroidism, unspecified: Secondary | ICD-10-CM

## 2020-04-25 DIAGNOSIS — A419 Sepsis, unspecified organism: Secondary | ICD-10-CM

## 2020-04-25 DIAGNOSIS — R6521 Severe sepsis with septic shock: Secondary | ICD-10-CM

## 2020-04-25 DIAGNOSIS — N390 Urinary tract infection, site not specified: Secondary | ICD-10-CM

## 2020-04-25 DIAGNOSIS — Z515 Encounter for palliative care: Secondary | ICD-10-CM

## 2020-04-25 DIAGNOSIS — Z7189 Other specified counseling: Secondary | ICD-10-CM

## 2020-04-25 LAB — BLOOD GAS, ARTERIAL
Acid-base deficit: 6.6 mmol/L — ABNORMAL HIGH (ref 0.0–2.0)
Bicarbonate: 19.1 mmol/L — ABNORMAL LOW (ref 20.0–28.0)
FIO2: 0.48
O2 Saturation: 84.9 %
Patient temperature: 37
pCO2 arterial: 38 mmHg (ref 32.0–48.0)
pH, Arterial: 7.31 — ABNORMAL LOW (ref 7.350–7.450)
pO2, Arterial: 55 mmHg — ABNORMAL LOW (ref 83.0–108.0)

## 2020-04-25 LAB — PHOSPHORUS: Phosphorus: 3.5 mg/dL (ref 2.5–4.6)

## 2020-04-25 LAB — BLOOD CULTURE ID PANEL (REFLEXED)

## 2020-04-25 LAB — CORTISOL-AM, BLOOD: Cortisol - AM: 20.4 ug/dL (ref 6.7–22.6)

## 2020-04-25 LAB — CBC WITH DIFFERENTIAL/PLATELET
Abs Immature Granulocytes: 0.11 10*3/uL — ABNORMAL HIGH (ref 0.00–0.07)
Basophils Absolute: 0.1 10*3/uL (ref 0.0–0.1)
Basophils Relative: 1 %
Eosinophils Absolute: 0 10*3/uL (ref 0.0–0.5)
Eosinophils Relative: 0 %
HCT: 39.8 % (ref 36.0–46.0)
Hemoglobin: 12.4 g/dL (ref 12.0–15.0)
Immature Granulocytes: 1 %
Lymphocytes Relative: 6 %
Lymphs Abs: 1.1 10*3/uL (ref 0.7–4.0)
MCH: 27.9 pg (ref 26.0–34.0)
MCHC: 31.2 g/dL (ref 30.0–36.0)
MCV: 89.6 fL (ref 80.0–100.0)
Monocytes Absolute: 0.9 10*3/uL (ref 0.1–1.0)
Monocytes Relative: 5 %
Neutro Abs: 15.6 10*3/uL — ABNORMAL HIGH (ref 1.7–7.7)
Neutrophils Relative %: 87 %
Platelets: 187 10*3/uL (ref 150–400)
RBC: 4.44 MIL/uL (ref 3.87–5.11)
RDW: 18.4 % — ABNORMAL HIGH (ref 11.5–15.5)
WBC: 17.9 10*3/uL — ABNORMAL HIGH (ref 4.0–10.5)
nRBC: 0 % (ref 0.0–0.2)

## 2020-04-25 LAB — TROPONIN I (HIGH SENSITIVITY)
Troponin I (High Sensitivity): 42 ng/L — ABNORMAL HIGH (ref <18)
Troponin I (High Sensitivity): 48 ng/L — ABNORMAL HIGH (ref <18)
Troponin I (High Sensitivity): 19 ng/L — ABNORMAL HIGH (ref <18)

## 2020-04-25 LAB — COMPREHENSIVE METABOLIC PANEL
ALT: 11 U/L (ref 0–44)
AST: 10 U/L — ABNORMAL LOW (ref 15–41)
Albumin: 2.8 g/dL — ABNORMAL LOW (ref 3.5–5.0)
Alkaline Phosphatase: 46 U/L (ref 38–126)
Anion gap: 6 (ref 5–15)
BUN: 40 mg/dL — ABNORMAL HIGH (ref 8–23)
CO2: 20 mmol/L — ABNORMAL LOW (ref 22–32)
Calcium: 8.2 mg/dL — ABNORMAL LOW (ref 8.9–10.3)
Chloride: 112 mmol/L — ABNORMAL HIGH (ref 98–111)
Creatinine, Ser: 1.04 mg/dL — ABNORMAL HIGH (ref 0.44–1.00)
GFR calc Af Amer: >60 mL/min (ref >60)
GFR calc non Af Amer: 56 mL/min — ABNORMAL LOW (ref >60)
Glucose, Bld: 127 mg/dL — ABNORMAL HIGH (ref 70–99)
Potassium: 4.2 mmol/L (ref 3.5–5.1)
Sodium: 138 mmol/L (ref 135–145)
Total Bilirubin: 0.9 mg/dL (ref 0.3–1.2)
Total Protein: 5.2 g/dL — ABNORMAL LOW (ref 6.5–8.1)

## 2020-04-25 LAB — URINE CULTURE: Culture: 40000 — AB

## 2020-04-25 LAB — BRAIN NATRIURETIC PEPTIDE: B Natriuretic Peptide: 2348.1 pg/mL — ABNORMAL HIGH (ref 0.0–100.0)

## 2020-04-25 LAB — BASIC METABOLIC PANEL
Anion gap: 14 (ref 5–15)
BUN: 41 mg/dL — ABNORMAL HIGH (ref 8–23)
CO2: 18 mmol/L — ABNORMAL LOW (ref 22–32)
Calcium: 8.2 mg/dL — ABNORMAL LOW (ref 8.9–10.3)
Chloride: 107 mmol/L (ref 98–111)
Creatinine, Ser: 0.9 mg/dL (ref 0.44–1.00)
GFR calc Af Amer: >60 mL/min (ref >60)
GFR calc non Af Amer: >60 mL/min (ref >60)
Glucose, Bld: 138 mg/dL — ABNORMAL HIGH (ref 70–99)
Potassium: 4.3 mmol/L (ref 3.5–5.1)
Sodium: 139 mmol/L (ref 135–145)

## 2020-04-25 LAB — ECHOCARDIOGRAM COMPLETE
Height: 65 in
Weight: 3721.36 oz

## 2020-04-25 LAB — LACTIC ACID, PLASMA
Lactic Acid, Venous: 0.6 mmol/L (ref 0.5–1.9)
Lactic Acid, Venous: 0.7 mmol/L (ref 0.5–1.9)

## 2020-04-25 LAB — GLUCOSE, CAPILLARY: Glucose-Capillary: 109 mg/dL — ABNORMAL HIGH (ref 70–99)

## 2020-04-25 LAB — MAGNESIUM: Magnesium: 1.8 mg/dL (ref 1.7–2.4)

## 2020-04-25 MED ORDER — NOREPINEPHRINE 4 MG/250ML-% IV SOLN
0.0000 ug/min | INTRAVENOUS | Status: DC
  Administered 2020-04-25: 2 ug/min via INTRAVENOUS
  Filled 2020-04-25: qty 250

## 2020-04-25 MED ORDER — MAGNESIUM SULFATE 2 GM/50ML IV SOLN
2.0000 g | Freq: Once | INTRAVENOUS | Status: AC
  Administered 2020-04-25: 2 g via INTRAVENOUS
  Filled 2020-04-25: qty 50

## 2020-04-25 MED ORDER — HEPARIN SODIUM (PORCINE) 5000 UNIT/ML IJ SOLN
5000.0000 [IU] | Freq: Three times a day (TID) | INTRAMUSCULAR | Status: DC
  Administered 2020-04-25 – 2020-04-28 (×9): 5000 [IU] via SUBCUTANEOUS
  Filled 2020-04-25 (×9): qty 1

## 2020-04-25 MED ORDER — VASOPRESSIN 20 UNIT/ML IV SOLN
0.0300 [IU]/min | INTRAVENOUS | Status: DC
  Administered 2020-04-25: 0.03 [IU]/min via INTRAVENOUS
  Filled 2020-04-25: qty 2

## 2020-04-25 MED ORDER — SIMETHICONE 80 MG PO CHEW
80.0000 mg | CHEWABLE_TABLET | Freq: Four times a day (QID) | ORAL | Status: DC | PRN
  Filled 2020-04-25: qty 1

## 2020-04-25 MED ORDER — FENTANYL CITRATE (PF) 100 MCG/2ML IJ SOLN
50.0000 ug | INTRAMUSCULAR | Status: DC | PRN
  Administered 2020-04-25: 50 ug via INTRAVENOUS
  Filled 2020-04-25: qty 2

## 2020-04-25 MED ORDER — LINEZOLID 600 MG/300ML IV SOLN
600.0000 mg | Freq: Two times a day (BID) | INTRAVENOUS | Status: DC
  Administered 2020-04-25 – 2020-04-26 (×4): 600 mg via INTRAVENOUS
  Filled 2020-04-25 (×6): qty 300

## 2020-04-25 MED ORDER — SODIUM CHLORIDE 0.9 % IV SOLN
250.0000 mL | INTRAVENOUS | Status: DC
  Administered 2020-04-25: 250 mL via INTRAVENOUS

## 2020-04-25 MED ORDER — PHENYLEPHRINE CONCENTRATED 100MG/250ML (0.4 MG/ML) INFUSION SIMPLE
0.0000 ug/min | INTRAVENOUS | Status: DC
  Administered 2020-04-25: 70 ug/min via INTRAVENOUS
  Filled 2020-04-25 (×2): qty 250

## 2020-04-25 MED ORDER — ATROPINE SULFATE 1 MG/ML IJ SOLN
1.0000 mg | Freq: Every day | INTRAMUSCULAR | Status: DC | PRN
  Administered 2020-04-25: 1 mg via INTRAVENOUS
  Filled 2020-04-25: qty 1

## 2020-04-25 MED ORDER — SODIUM CHLORIDE 0.9 % IV SOLN
2.0000 g | INTRAVENOUS | Status: DC
  Administered 2020-04-25 – 2020-04-28 (×4): 2 g via INTRAVENOUS
  Filled 2020-04-25 (×2): qty 2
  Filled 2020-04-25: qty 20
  Filled 2020-04-25 (×2): qty 2

## 2020-04-25 MED ORDER — HYDROCORTISONE NA SUCCINATE PF 100 MG IJ SOLR
50.0000 mg | Freq: Four times a day (QID) | INTRAMUSCULAR | Status: DC
  Administered 2020-04-25 – 2020-04-26 (×6): 50 mg via INTRAVENOUS
  Filled 2020-04-25 (×6): qty 2

## 2020-04-25 MED ORDER — LACTATED RINGERS IV BOLUS
250.0000 mL | Freq: Once | INTRAVENOUS | Status: AC
  Administered 2020-04-25: 250 mL via INTRAVENOUS

## 2020-04-25 MED ORDER — SODIUM CHLORIDE 0.9 % IV SOLN
0.0000 ug/min | INTRAVENOUS | Status: DC
  Administered 2020-04-25: 70 ug/min via INTRAVENOUS
  Administered 2020-04-25: 100 ug/min via INTRAVENOUS
  Administered 2020-04-25 (×2): 90 ug/min via INTRAVENOUS
  Filled 2020-04-25 (×2): qty 1
  Filled 2020-04-25 (×3): qty 10

## 2020-04-25 MED ORDER — ALBUMIN HUMAN 25 % IV SOLN
25.0000 g | Freq: Once | INTRAVENOUS | Status: AC
  Administered 2020-04-25: 25 g via INTRAVENOUS
  Filled 2020-04-25: qty 100

## 2020-04-25 MED ORDER — ATROPINE SULFATE 1 MG/10ML IJ SOSY
PREFILLED_SYRINGE | INTRAMUSCULAR | Status: AC
  Filled 2020-04-25: qty 10

## 2020-04-25 NOTE — Procedures (Signed)
Central Venous Catheter Insertion Procedure Note Regina White [id number] 1956-10-25  Procedure: Insertion of Central Venous Catheter Indications: Assessment of intravascular volume, Drug and/or fluid administration and Frequent blood sampling  Procedure Details Consent: Risks of procedure as well as the alternatives and risks of each were explained to the (patient/caregiver).  Consent for procedure obtained. Time Out: Verified patient identification, verified procedure, site/side was marked, verified correct patient position, special equipment/implants available, medications/allergies/relevent history reviewed, required imaging and test results available.  Performed  Maximum sterile technique was used including antiseptics, cap, gloves, gown, hand hygiene, mask and sheet. Skin prep: Chlorhexidine; local anesthetic administered A antimicrobial bonded/coated triple lumen catheter was placed in the right internal jugular vein using the Seldinger technique.  Evaluation Blood flow good Complications: No apparent complications Patient did tolerate procedure well. Chest X-ray ordered to verify placement.  CXR: pending.    Procedure was performed using Ultrasound for direct visualization of cannulization of Right IJ.  Line was secured at the 16 cm mark.  BIOPATCH applied to the insertion site.   Harlon Ditty, AGACNP-BC [facility] Pager: [phone]  [name] 04/25/2020, 4:19 AM

## 2020-04-25 NOTE — Progress Notes (Signed)
Cross Cover Brief Note Nurse reports abnormal heart rhythm and hypotension.  BP readings likely not accurate as great variability with repeated measures.  Patient has had no change in her rhythm and no new findings on EKG. Noted EF 2017 35-40% and BNP above 20000. No additional fluids ordered, IV fluids discontinued. Critical care team consulted for possible art line and assistance in management given hypotension and sepsis

## 2020-04-25 NOTE — Progress Notes (Signed)
Patient admitted around 2100, complaints of 10/10 pain, hypotension  2251 - NP notified of lactic 2.5, coarse lung sounds, hypotension persists, irregular arrhythmias and EKG completed  2326 - hypotension persists, unable to get accurate BP, persistent 10/10 pain w/ activity, requesting provider to come to bedside for evaluation. Per NP, hold 2300 fluid bolus. Per NP if no change in mentation continue to monitor pressures  0216 - hypotension persists, NP notified, requesting clarification on parameters for BP management and request for placement of art line/pressors/ICU consult   Upon admission, this RN and patient discussed at length home life situation. Per patient her husband is her full time caregiver. Patient has diffuse raw-like red/purple MASD to entire length of backside with scattered opened wounds. Per patient, wound nurses were visiting her at home and managing care and consulted for this hospital admission. I have discussed with patient at length her goals of care during this admission and she states that she does not want to go to rehab/SNIF/assisted living when time comes from discharge. Palliative, TOC, and social work consulted to discuss goals of care/optoins. Patient is exteremly tearful and anxious and requiring frequent emotional support. Refused spiritual care/chapalin at this time.

## 2020-04-25 NOTE — Progress Notes (Addendum)
[facility]  - [place] at [facility]   PATIENT NAME: Regina White    MR#:  [id number]  DATE OF BIRTH:  Mar 20, 1956  SUBJECTIVE:   Patient complained of severe left hip pain. Screaming with pain been try to move to take a look at her skin on the buttock.         REVIEW OF SYSTEMS:   Review of Systems  Constitutional: Positive for malaise/fatigue. Negative for chills, fever and weight loss.  HENT: Negative for ear discharge, ear pain and nosebleeds.   Eyes: Negative for blurred vision, pain and discharge.  Respiratory: Negative for sputum production, shortness of breath, wheezing and stridor.   Cardiovascular: Negative for chest pain, palpitations, orthopnea and PND.  Gastrointestinal: Negative for abdominal pain, diarrhea, nausea and vomiting.  Genitourinary: Negative for frequency and urgency.  Musculoskeletal: Positive for joint pain. Negative for back pain.  Neurological: Positive for weakness. Negative for sensory change, speech change and focal weakness.  Psychiatric/Behavioral: Negative for depression and hallucinations. The patient is not nervous/anxious.    Tolerating Diet:yes Tolerating PT: bed boiund  DRUG ALLERGIES:   Allergies  Allergen Reactions  . Penicillins Anaphylaxis    Breathing problems Did it involve swelling of the face/tongue/throat, SOB, or low BP? Yes Did it involve sudden or severe rash/hives, skin peeling, or any reaction on the inside of your mouth or nose? Unknown Did you need to seek medical attention at a hospital or doctor's office? Unknown When did it last happen?unknown If all above answers are "NO", may proceed with cephalosporin use. Patient tolerated Ceftriaxone September 2020  . Erythromycin Nausea And Vomiting and Rash    STOMACH PAIN  . Levofloxacin Other (See Comments)    Muscle aches  . Lidocaine Rash  . Clindamycin Other (See Comments)    Recurrent C diff  . Levothyroxine Other (See Comments)    Vaginal  bleeding Reaction to generic (pt currently takes name brand Synthroid)  . Tiotropium Other (See Comments)    Coughing up blood  . Doxycycline Nausea And Vomiting  . Sulfa Antibiotics Rash    VITALS:  Blood pressure 121/67, pulse 61, temperature 98.2 F (36.8 C), temperature source Axillary, resp. rate (!) 21, height 5\' 5"  (1.651 m), weight 105.5 kg, SpO2 91 %.  PHYSICAL EXAMINATION:   Physical Exam  GENERAL:  64 y.o.-year-old patient lying in the bed with no acute distress.morbid obesity  EYES: Pupils equal, round, reactive to light and accommodation. No scleral icterus.   HEENT: Head atraumatic, normocephalic. Oropharynx and nasopharynx clear.  NECK:  Supple, no jugular venous distention. No thyroid enlargement, no tenderness.  LUNGS: Normal breath sounds bilaterally, no wheezing, rales, rhonchi. No use of accessory muscles of respiration.  CARDIOVASCULAR: S1, S2 normal. No murmurs, rubs, or gallops.  ABDOMEN: Soft, nontender, nondistended. Bowel sounds present. No organomegaly or mass.  Right femoral line+ (June 18th) Foley+ (6/18) EXTREMITIES: No cyanosis, clubbing  + edema b/l.    NEUROLOGIC: non faocl PSYCHIATRIC:  patient is alert and oriented x 3.  SKIN:    LABORATORY PANEL:  CBC Recent Labs  Lab 04/25/20 0432  WBC 17.9*  HGB 12.4  HCT 39.8  PLT 187    Chemistries  Recent Labs  Lab 04/24/20 2219 04/24/20 2219 04/25/20 0432  NA 140   < > 138  K 4.3   < > 4.2  CL 108   < > 112*  CO2 25   < > 20*  GLUCOSE 127*   < >  127*  BUN 40*   < > 40*  CREATININE 1.04*   < > 1.04*  CALCIUM 8.4*   < > 8.2*  MG 1.8  --   --   AST 14*   < > 10*  ALT 12   < > 11  ALKPHOS 51   < > 46  BILITOT 0.9   < > 0.9   < > = values in this interval not displayed.   Cardiac Enzymes No results for input(s): TROPONINI in the last 168 hours. RADIOLOGY:  CT Angio Chest PE W and/or Wo Contrast  Result Date: 04/24/2020 CLINICAL DATA:  Sepsis, weakness, shortness of breath,  hypoxia EXAM: CT ANGIOGRAPHY CHEST WITH CONTRAST TECHNIQUE: Multidetector CT imaging of the chest was performed using the standard protocol during bolus administration of intravenous contrast. Multiplanar CT image reconstructions and MIPs were obtained to evaluate the vascular anatomy. CONTRAST:  OMNIPAQUE IOHEXOL 350 MG/ML SOLN COMPARISON:  06/02/2018 FINDINGS: Cardiovascular: This is a technically adequate evaluation of the pulmonary vasculature. There are no filling defects or pulmonary emboli. Marked enlargement of the main pulmonary artery is unchanged, consistent with pulmonary arterial hypertension. Stable cardiomegaly, with prominent right ventricular dilatation. Normal caliber of the thoracic aorta. Mediastinum/Nodes: No enlarged mediastinal, hilar, or axillary lymph nodes. Thyroid gland, trachea, and esophagus demonstrate no significant findings. Lungs/Pleura: Diffuse background emphysema unchanged. Scattered hypoventilatory changes are seen within the dependent lower lobes. No airspace disease, effusion, or pneumothorax. Central airways are patent. Upper Abdomen: Reflux of contrast into the hepatic veins consistent with cardiac dysfunction. Please refer to dedicated CT abdomen and pelvis report. Musculoskeletal: There are no acute or destructive bony lesions. Spinal stimulator is seen within the thoracic central canal, lead at the T9 level. Reconstructed images demonstrate no additional findings. Review of the MIP images confirms the above findings. IMPRESSION: 1. No evidence of pulmonary embolus. 2. Stable dilatation of the main pulmonary artery consistent with pulmonary arterial hypertension. 3. Marked cardiomegaly and right ventricular dilatation, with reflux of contrast into the hepatic veins compatible with cardiac dysfunction. 4.  Emphysema (ICD10-J43.9). Electronically Signed   By: Sharlet Salina M.D.   On: 04/24/2020 17:40   CT ABDOMEN PELVIS W CONTRAST  Result Date: 04/24/2020 CLINICAL  DATA:  Nausea and vomiting, weakness, decubitus ulcer EXAM: CT ABDOMEN AND PELVIS WITH CONTRAST TECHNIQUE: Multidetector CT imaging of the abdomen and pelvis was performed using the standard protocol following bolus administration of intravenous contrast. CONTRAST:  OMNIPAQUE IOHEXOL 350 MG/ML SOLN COMPARISON:  None. FINDINGS: Lower chest: Hypoventilatory changes are seen at the lung bases. The heart is enlarged. Please refer to dedicated chest CT report. Hepatobiliary: Dependent calcified gallstone without cholecystitis. The liver is unremarkable. Pancreas: Unremarkable. No pancreatic ductal dilatation or surrounding inflammatory changes. Spleen: Normal in size without focal abnormality. Adrenals/Urinary Tract: Evaluation of the kidneys limited due to streak artifact from orthopedic hardware. There are multiple areas of bilateral renal cortical scarring. No evidence of hydronephrosis. The adrenals are grossly unremarkable. Bladder is normal. Stomach/Bowel: No bowel obstruction or ileus. Moderate retained stool throughout the colon. Normal appendix right lower quadrant. No bowel wall thickening or inflammatory change. Vascular/Lymphatic: Venous stent identified in the left external iliac and common iliac veins. No significant atherosclerosis. No pathologic adenopathy. Reproductive: Uterus is enlarged, with a dominant intramural fibroid measuring 7.2 x 6.7 by 6.0 cm. No adnexal masses. Other: No free fluid or free gas. No abdominal wall hernia. Musculoskeletal: Extensive postsurgical changes are seen from multilevel lumbar fusion. Spinal stimulator  is seen entering the central canal at approximately L2, lead extending superiorly in the thoracic central canal. There are no acute bony abnormalities. Severe bilateral hip osteoarthritis is noted, left greater than right. Reconstructed images demonstrate no additional findings. IMPRESSION: 1. Cholelithiasis without cholecystitis. 2. Enlarged fibroid uterus. 3.  Moderate fecal retention. Electronically Signed   By: Sharlet Salina M.D.   On: 04/24/2020 17:45   DG Chest Port 1 View  Result Date: 04/25/2020 CLINICAL DATA:  64 year old female central line placement. EXAM: PORTABLE CHEST 1 VIEW COMPARISON:  CTA chest and portable chest yesterday. FINDINGS: Portable AP supine view at 0433 hours. Right IJ central line placed, tip at the level of the carina/SVC. Mildly improved lung volumes. Stable cardiomegaly and mediastinal contours. Visualized tracheal air column is within normal limits. No pneumothorax. Regressed atelectasis. No new pulmonary opacity. IMPRESSION: 1. Right IJ central line placed, tip at the SVC level. No pneumothorax. 2. Improved lung volumes and ventilation. No new cardiopulmonary abnormality. Electronically Signed   By: Odessa Fleming M.D.   On: 04/25/2020 04:50   DG Chest Port 1 View  Result Date: 04/24/2020 CLINICAL DATA:  Sepsis. EXAM: PORTABLE CHEST 1 VIEW COMPARISON:  October 19, 2019. FINDINGS: Stable cardiomegaly. No pneumothorax or pleural effusion is noted. Stable small interstitial densities are noted throughout both lungs which may represent scarring, although superimposed acute inflammation or edema cannot be excluded. Bony thorax is unremarkable. IMPRESSION: Stable small interstitial densities are noted throughout both lungs which may represent scarring, although superimposed acute inflammation or edema cannot be excluded. Electronically Signed   By: Lupita Raider M.D.   On: 04/24/2020 14:36   ECHOCARDIOGRAM COMPLETE  Result Date: 04/25/2020    ECHOCARDIOGRAM REPORT   Patient Name:   CACEY WILLOW Lingard Date of Exam: 04/25/2020 Medical Rec #:  [id number]          Height:       65.0 in Accession #:    [id number]         Weight:       232.6 lb Date of Birth:  [birth year]         BSA:          2.109 m Patient Age:    63 years           BP:           156/77 mmHg Patient Gender: F                  HR:           50 bpm. Exam Location:  [facility]  Procedure: 2D Echo, Cardiac Doppler and Color Doppler Indications:     CHF- acute systolic 428.21  History:         Patient has prior history of Echocardiogram examinations, most                  recent 06/02/2018. CHF; COPD and Pulmonary HTN.  Sonographer:     Cristela Blue RDCS (AE) Referring Phys:  [id number] BRENDA MORRISON Diagnosing Phys: Julien Nordmann MD  Sonographer Comments: Suboptimal apical window. Image acquisition challenging due to COPD. IMPRESSIONS  1. Left ventricular ejection fraction, by estimation, is 35 to 40%. The left ventricle has moderately decreased function. The left ventricle demonstrates global hypokinesis. The left ventricular internal cavity size was mildly dilated. Left ventricular diastolic parameters are consistent with Grade I diastolic dysfunction (impaired relaxation).  2. Right ventricular systolic function is moderately depressed. The right ventricular size is  moderately dilated. There is severely elevated pulmonary artery systolic pressure.  3. Left atrial size was mildly dilated.  4. Tricuspid valve regurgitation is mild to moderate.  5. The inferior vena cava is dilated in size with >50% respiratory variability, suggesting right atrial pressure of 8 mmHg. FINDINGS  Left Ventricle: Left ventricular ejection fraction, by estimation, is 35 to 40%. The left ventricle has moderately decreased function. The left ventricle demonstrates global hypokinesis. The left ventricular internal cavity size was mildly dilated. There is no left ventricular hypertrophy. Left ventricular diastolic parameters are consistent with Grade I diastolic dysfunction (impaired relaxation). Right Ventricle: The right ventricular size is moderately enlarged. No increase in right ventricular wall thickness. Right ventricular systolic function is moderately reduced. There is severely elevated pulmonary artery systolic pressure. The tricuspid regurgitant velocity is 3.68 m/s, and with an assumed right atrial pressure  of 10 mmHg, the estimated right ventricular systolic pressure is 64.2 mmHg. Left Atrium: Left atrial size was mildly dilated. Right Atrium: Right atrial size was normal in size. Pericardium: There is no evidence of pericardial effusion. Mitral Valve: The mitral valve is normal in structure. Normal mobility of the mitral valve leaflets. No evidence of mitral valve regurgitation. No evidence of mitral valve stenosis. Tricuspid Valve: The tricuspid valve is normal in structure. Tricuspid valve regurgitation is mild to moderate. No evidence of tricuspid stenosis. Aortic Valve: The aortic valve was not well visualized. Aortic valve regurgitation is not visualized. No aortic stenosis is present. Aortic valve mean gradient measures 3.0 mmHg. Aortic valve peak gradient measures 5.8 mmHg. Aortic valve area, by VTI measures 1.83 cm. Pulmonic Valve: The pulmonic valve was normal in structure. Pulmonic valve regurgitation is not visualized. No evidence of pulmonic stenosis. Aorta: The aortic root is normal in size and structure. Venous: The inferior vena cava is dilated in size with greater than 50% respiratory variability, suggesting right atrial pressure of 8 mmHg. IAS/Shunts: No atrial level shunt detected by color flow Doppler.  LEFT VENTRICLE PLAX 2D LVIDd:         5.00 cm  Diastology LVIDs:         4.10 cm  LV e' lateral:   6.31 cm/s LV PW:         1.38 cm  LV E/e' lateral: 7.3 LV IVS:        0.84 cm LVOT diam:     2.10 cm LV SV:         41 LV SV Index:   20 LVOT Area:     3.46 cm  RIGHT VENTRICLE RV Basal diam:  5.42 cm RV S prime:     8.81 cm/s TAPSE (M-mode): 4.8 cm LEFT ATRIUM              Index       RIGHT ATRIUM           Index LA diam:        4.30 cm  2.04 cm/m  RA Area:     20.90 cm LA Vol (A2C):   117.0 ml 55.47 ml/m RA Volume:   60.90 ml  28.87 ml/m LA Vol (A4C):   58.4 ml  27.69 ml/m LA Biplane Vol: 85.5 ml  40.54 ml/m  AORTIC VALVE AV Area (Vmax):    1.32 cm AV Area (Vmean):   1.38 cm AV Area (VTI):      1.83 cm AV Vmax:           120.00 cm/s AV Vmean:  82.100 cm/s AV VTI:            0.225 m AV Peak Grad:      5.8 mmHg AV Mean Grad:      3.0 mmHg LVOT Vmax:         45.70 cm/s LVOT Vmean:        32.600 cm/s LVOT VTI:          0.119 m LVOT/AV VTI ratio: 0.53  AORTA Ao Root diam: 3.10 cm MITRAL VALVE               TRICUSPID VALVE MV Area (PHT): 7.22 cm    TR Peak grad:   54.2 mmHg MV Decel Time: 105 msec    TR Vmax:        368.00 cm/s MV E velocity: 46.30 cm/s MV A velocity: 84.00 cm/s  SHUNTS MV E/A ratio:  0.55        Systemic VTI:  0.12 m                            Systemic Diam: 2.10 cm Ida Rogue MD Electronically signed by Ida Rogue MD Signature Date/Time: 04/25/2020/11:15:50 AM    Final    ASSESSMENT AND PLAN:   Cecelia Graciano Bi is a 64 y.o. female with medical history significant of COPD on 3 L oxygen at home, diastolic CHF, pulmonary hypertension, asthma, chronic pain is syndrome, pulmonary hypertension, who came to the ER with significant pain weakness and was found to be septic.  She has a wound on the left side of her buttocks.  She was also short of breath.  *Septic shock with acute on chronic hypoxic respiratory failure in acute renal failure -source urine and decubitus -continue IV phenylephrine and vasopressin -stress those IV steroids -IV fluids -appreciate ICU attending input -blood culture positive for Streptococcus agalactaie  -IV Rocephin and linezolid-- de-escalate antibiotics when appropriate  *Acute renal failure secondary to hypotension in the setting of septic shock -continue IV fluids IV pressure -Avoid nephrotoxic agents -monitor input output at-monitor creatinine -consider nephrology consultation if needed  *Infected small decubitus -seen by surgery no surgical intervention needed. 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 - Patient is  bedbound for more than a year -she has morphine pump for many years due to her chronic back pain -complains of excruciating pain and unable to move without causing a lot of pain -palliative care consultation for goals of care -PRN pain meds as well as allow  *Incidental finding of cholelithiasis of CT scan -labs stable continue to monitor  *Dilated cardiomyopathy. Patient follows at [facility]. -Echo shows EF of 35%. -As outpatient patient is on bisoprolol and entersto-- holding currently due to low blood pressure -mild elevated BNP. Will give touch of Lasix as blood pressure levels once patient is off pressers  *Acute hypoxic respiratory failure secondary to COPD exacerbation and mild CHF systolic acute -continue BiPAP as needed -PRN Lasix blood pressure lab -inhalers/nebulizer -patient uses chronic oxygen 4 L at home--- post COVID infection in December 2020  *Hypothyroidism continue Synthroid   Procedures:arterial line Family communication : Consults :cardiology. ICU CODE STATUS: FULL DVT Prophylaxis :heparin  Status is: Inpatient  Remains inpatient appropriate because:IV treatments appropriate due to intensity of illness or inability to take PO   Dispo: The patient is from: Home  Anticipated d/c is to: TBD              Anticipated d/c date is: > 3 days              Patient currently is not medically stable to d/c.        TOTAL critical came in with generalized weakness. TIME TAKING CARE OF THIS PATIENT: 35 minutes.  >50% time spent on counselling and coordination of care  Note: This dictation was prepared with Dragon dictation along with smaller phrase technology. Any transcriptional errors that result from this process are unintentional.  Enedina FinnerSona Jennah Satchell M.D    [facility]   CC: Primary care physician; Cain SieveKlipstein, Christopher, MDPatient ID: Regina White, female   DOB: 15-Mar-1956, 64 y.o.   MRN: [id number]

## 2020-04-25 NOTE — Progress Notes (Signed)
Pt is A&OX4. Pt c/o back and L hip pain. Pt screams out in pain with any movement. Pain medication administered prn. Weaning off pressors as able. Per pt she does not want blood pressures taken due to pain. Arterial line has a good waveform and correlates. RN will use Arterial line to titrate pressors at this time. Pt agrees to taking BP with cuff every 4 hours to confirm correlation. Foley placed for incontinence with skin rash and ulcer. MD made aware of ventricular bigeminy rhythm, verbal orders for BMP and mag levels placed. Will continue to monitor.

## 2020-04-25 NOTE — Procedures (Signed)
Arterial Catheter Insertion Procedure Note Regina White [id number] 03/01/1956  Procedure: Insertion of Arterial Catheter  Indications: Blood pressure monitoring and Frequent blood sampling  Procedure Details Consent: Risks of procedure as well as the alternatives and risks of each were explained to the (patient/caregiver).  Consent for procedure obtained. Time Out: Verified patient identification, verified procedure, site/side was marked, verified correct patient position, special equipment/implants available, medications/allergies/relevent history reviewed, required imaging and test results available.  Performed  Maximum sterile technique was used including antiseptics, cap, gloves, gown, hand hygiene, mask and sheet. Skin prep: Chlorhexidine; local anesthetic administered 20 gauge catheter was inserted into right femoral artery using the Seldinger technique. ULTRASOUND GUIDANCE USED: YES Evaluation Blood flow good; BP tracing good. Complications: No apparent complications.   BIOPATCH was applied to insertion site.   Harlon Ditty, AGACNP-BC [facility] Pager: [phone]  [name] 04/25/2020

## 2020-04-25 NOTE — Consult Note (Signed)
Cardiology Consultation:   Patient ID: Regina White MRN: [id number]; DOB: 1955-12-30  Admit date: 04/24/2020 Date of Consult: 04/25/2020  Primary Care Provider: Cain Sieve, MD [facility] Cardiologist: new to [facility] Physician requesting consult: Elvina Sidle Reason for consult: CHF  Patient Profile:   Regina White is a 64 y.o. female with a hx of dilated cardiomyopathy, COPD , Covid + December 2020 on 4 L oxygen since that time , pulmonary hypertension presenting with severe sepsis secondary to UTI/cellulitis, hypotension requiring vasopressors, Streptococcus agalactiae BACTEREMIA, cardiomyopathy ejection fraction 35%  History of Present Illness:   [name] presented to the hospital with weakness, decubitus ulcer, wound smelling like rotten smell, respirations 40, saturations low 80s on room air  She was found by health department nurse who is giving Covid vaccines  Emergency room notes indicating she was short of breath and ill-appearing, Very limited at home secondary to severe hip pain, buttock pain, has sacral decub wounds  Blood pressure on arrival 67/45, respiratory rate 48 satting 89% on room air, 95% on nonrebreather  CT scan abdomen pelvis showed  cholelithiasis without cholecystitis.  Also a large fibroid uterus and moderate fecal retention.    CT angiogram of the chest showed no PE.  Stable dilatation of the main pulmonary artery due to pulmonary arterial hypertension   And COPD  This morning she is mentating better, continues to have severe hip and back pain, tearful at times, Main complaint is getting management of her hip Scheduled to see orthopedics in August at [facility] Reports difficulty voiding, daughter puts depend undergarments on her, does not like to use bedpan  Echocardiogram reviewed personally by myself showing 1. Left ventricular ejection fraction, by estimation, is 35 to 40%. The  left ventricle has moderately decreased  function. The left ventricle  demonstrates global hypokinesis. The left ventricular internal cavity size  was mildly dilated. Left ventricular  diastolic parameters are consistent with Grade I diastolic dysfunction  (impaired relaxation).  2. Right ventricular systolic function is moderately depressed. The right  ventricular size is moderately dilated. There is severely elevated  pulmonary artery systolic pressure.  3. Left atrial size was mildly dilated.  4. Tricuspid valve regurgitation is mild to moderate.  5. The inferior vena cava is dilated in size with >50% respiratory  variability, suggesting right atrial pressure of 8 mmHg.   Past Medical History:  Diagnosis Date  . Asthma   . CHF (congestive heart failure) (HCC)   . Chronic back pain   . COPD (chronic obstructive pulmonary disease) (HCC)   . Pulmonary hypertension (HCC)     Past Surgical History:  Procedure Laterality Date  . BACK SURGERY       Home Medications:  Prior to Admission medications   Medication Sig Start Date End Date Taking? Authorizing Provider  albuterol (VENTOLIN HFA) 108 (90 Base) MCG/ACT inhaler Inhale 2 puffs into the lungs every 6 (six) hours as needed for wheezing or shortness of breath. 10/25/19  Yes Tyrone Nine, MD  apixaban (ELIQUIS) 2.5 MG TABS tablet Take 1 tablet (2.5 mg total) by mouth 2 (two) times daily. 10/25/19  Yes Tyrone Nine, MD  bisoprolol (ZEBETA) 5 MG tablet Take 5 mg by mouth daily. 03/12/20  Yes [provider]  Cholecalciferol (VITAMIN D) 50 MCG (2000 UT) tablet Take 2,000 Units by mouth daily.   Yes [provider]  ENTRESTO 24-26 MG Take 1 tablet by mouth 2 (two) times daily. 04/13/20  Yes [provider]  famotidine (PEPCID) 20 MG tablet Take 1 tablet (20 mg total) by mouth 2 (two) times daily. Patient taking differently: Take 20 mg by mouth daily.  10/25/19  Yes Tyrone Nine, MD  Fluticasone-Salmeterol (ADVAIR) 500-50 MCG/DOSE AEPB Inhale 1  puff into the lungs 2 (two) times daily. 10/25/19  Yes Tyrone Nine, MD  guaiFENesin-dextromethorphan (ROBITUSSIN DM) 100-10 MG/5ML syrup Take 5 mLs by mouth every 4 (four) hours as needed for cough. 10/25/19  Yes Tyrone Nine, MD  ipratropium (ATROVENT) 0.02 % nebulizer solution Inhale into the lungs. 11/23/19 11/22/20 Yes [provider]  levothyroxine (SYNTHROID) 200 MCG tablet Take 1 tablet (200 mcg total) by mouth daily at 6 (six) AM. 10/25/19  Yes Tyrone Nine, MD  montelukast (SINGULAIR) 10 MG tablet Take 1 tablet (10 mg total) by mouth at bedtime. 10/25/19  Yes Tyrone Nine, MD  NON FORMULARY by Intrathecal Infusion route continuous. MORPHINE / BUPIVACAINE INTRATHECAL PUMP    Yes [provider]  polyethylene glycol (MIRALAX / GLYCOLAX) 17 g packet Take 17 g by mouth daily as needed (constipation).   Yes [provider]  traZODone (DESYREL) 50 MG tablet Take 150 mg by mouth at bedtime.  04/17/20  Yes [provider]    Inpatient Medications: Scheduled Meds: . Chlorhexidine Gluconate Cloth  6 each Topical Q0600  . cholecalciferol  2,000 Units Oral Daily  . famotidine  20 mg Oral Daily  . heparin injection (subcutaneous)  5,000 Units Subcutaneous Q8H  . hydrocortisone sod succinate (SOLU-CORTEF) inj  50 mg Intravenous Q6H  . levothyroxine  200 mcg Oral Q0600  . melatonin  5 mg Oral QHS  . mometasone-formoterol  2 puff Inhalation BID  . montelukast  10 mg Oral QHS  . traZODone  150 mg Oral QHS   Continuous Infusions: . sodium chloride 10 mL/hr at 04/25/20 1200  . cefTRIAXone (ROCEPHIN)  IV Stopped (04/25/20 1148)  . linezolid (ZYVOX) IV 600 mg (04/25/20 1246)  . phenylephrine (NEO-SYNEPHRINE) Adult infusion 70 mcg/min (04/25/20 1234)  . vasopressin (PITRESSIN) infusion - *FOR SHOCK* Stopped (04/25/20 1058)   PRN Meds: acetaminophen **OR** acetaminophen, albuterol, fentaNYL (SUBLIMAZE) injection, guaiFENesin-dextromethorphan, ipratropium,  morphine injection, ondansetron **OR** ondansetron (ZOFRAN) IV, polyethylene glycol, simethicone  Allergies:    Allergies  Allergen Reactions  . Penicillins Anaphylaxis    Breathing problems Did it involve swelling of the face/tongue/throat, SOB, or low BP? Yes Did it involve sudden or severe rash/hives, skin peeling, or any reaction on the inside of your mouth or nose? Unknown Did you need to seek medical attention at a hospital or doctor's office? Unknown When did it last happen?unknown If all above answers are "NO", may proceed with cephalosporin use. Patient tolerated Ceftriaxone September 2020  . Erythromycin Nausea And Vomiting and Rash    STOMACH PAIN  . Levofloxacin Other (See Comments)    Muscle aches  . Lidocaine Rash  . Clindamycin Other (See Comments)    Recurrent C diff  . Levothyroxine Other (See Comments)    Vaginal bleeding Reaction to generic (pt currently takes name brand Synthroid)  . Tiotropium Other (See Comments)    Coughing up blood  . Doxycycline Nausea And Vomiting  . Sulfa Antibiotics Rash    Social History:   Social History   Socioeconomic History  . Marital status: Married    Spouse name: Not on file  . Number of children: Not on file  . Years of education: Not on file  . Highest education level:  Not on file  Occupational History  . Not on file  Tobacco Use  . Smoking status: Former Smoker    Packs/day: 1.00    Years: 30.00    Pack years: 30.00    Types: Cigarettes    Quit date: 06/09/1999    Years since quitting: 20.8  . Smokeless tobacco: Never Used  Vaping Use  . Vaping Use: Never used  Substance and Sexual Activity  . Alcohol use: No  . Drug use: Never  . Sexual activity: Not Currently  Other Topics Concern  . Not on file  Social History Narrative   Living at [facility] at this time   Social Determinants of Health   Financial Resource Strain:   . Difficulty of Paying Living Expenses:   Food Insecurity:   . Worried  About Charity fundraiser in the Last Year:   . Arboriculturist in the Last Year:   Transportation Needs:   . Film/video editor (Medical):   Marland Kitchen Lack of Transportation (Non-Medical):   Physical Activity:   . Days of Exercise per Week:   . Minutes of Exercise per Session:   Stress:   . Feeling of Stress :   Social Connections:   . Frequency of Communication with Friends and Family:   . Frequency of Social Gatherings with Friends and Family:   . Attends Religious Services:   . Active Member of Clubs or Organizations:   . Attends Archivist Meetings:   Marland Kitchen Marital Status:   Intimate Partner Violence:   . Fear of Current or Ex-Partner:   . Emotionally Abused:   Marland Kitchen Physically Abused:   . Sexually Abused:     Family History:    Family History  Problem Relation Age of Onset  . Hypertension Mother   . Liver cancer Father      ROS:  Please see the history of present illness.  Review of Systems  Constitutional: Negative.   HENT: Negative.   Respiratory: Positive for shortness of breath.   Cardiovascular: Negative.   Gastrointestinal: Negative.   Musculoskeletal: Positive for back pain and joint pain.  Neurological: Negative.   Psychiatric/Behavioral: Negative.   All other systems reviewed and are negative.   Physical Exam/Data:   Vitals:   04/25/20 0700 04/25/20 0800 04/25/20 0900 04/25/20 1204  BP: 136/79 131/73 (!) 156/77 121/67  Pulse: 65 62 (!) 50 61  Resp: (!) 25 (!) 22 (!) 23 (!) 21  Temp:  98.4 F (36.9 C)  98.2 F (36.8 C)  TempSrc:  Axillary  Axillary  SpO2: 92% 95% 91% 91%  Weight:      Height:        Intake/Output Summary (Last 24 hours) at 04/25/2020 1354 Last data filed at 04/25/2020 1200 Gross per 24 hour  Intake 3546.28 ml  Output 550 ml  Net 2996.28 ml   Last 3 Weights 04/24/2020 04/24/2020 10/19/2019  Weight (lbs) 232 lb 9.4 oz 206 lb 210 lb 15.7 oz  Weight (kg) 105.5 kg 93.441 kg 95.7 kg     Body mass index is 38.7 kg/m.    General:  Well nourished, well developed, in no acute distress HEENT: normal Lymph: no adenopathy Neck: Unable to estimate JVD Endocrine:  No thryomegaly Vascular: No carotid bruits; FA pulses 2+ bilaterally without bruits  Cardiac:  normal S1, S2; RRR; no murmur  Lungs:  clear to auscultation bilaterally, no wheezing, rhonchi or rales  Abd: soft, nontender, no hepatomegaly  Ext: no  edema Musculoskeletal:  No deformities, BUE and BLE strength normal and equal Skin: warm and dry  Neuro:  CNs 2-12 intact, no focal abnormalities noted Psych:  Normal affect   EKG:  The EKG was personally reviewed and demonstrates:   Normal sinus rhythm with rate 88 bpm, right bundle branch block, short runs atrial tachycardia, PVCs Telemetry:  Telemetry was personally reviewed and demonstrates: Normal sinus rhythm  Relevant CV Studies: Echo as above  Laboratory Data:  High Sensitivity Troponin:   Recent Labs  Lab 04/24/20 2157 04/25/20 0053  TROPONINIHS 42* 48*     Chemistry Recent Labs  Lab 04/24/20 1330 04/24/20 2219 04/25/20 0432  NA 143 140 138  K 4.5 4.3 4.2  CL 112* 108 112*  CO2 22 25 20*  GLUCOSE 146* 127* 127*  BUN 40* 40* 40*  CREATININE 0.97 1.04* 1.04*  CALCIUM 8.8* 8.4* 8.2*  GFRNONAA >60 56* 56*  GFRAA >60 >60 >60  ANIONGAP 9 7 6     Recent Labs  Lab 04/24/20 1330 04/24/20 2219 04/25/20 0432  PROT 6.0* 5.8* 5.2*  ALBUMIN 3.3* 3.1* 2.8*  AST 12* 14* 10*  ALT 12 12 11   ALKPHOS 58 51 46  BILITOT 0.7 0.9 0.9   Hematology Recent Labs  Lab 04/24/20 1330 04/24/20 2219 04/25/20 0432  WBC 12.7* 16.7* 17.9*  RBC 5.13* 4.78 4.44  HGB 14.6 13.5 12.4  HCT 46.1* 43.1 39.8  MCV 89.9 90.2 89.6  MCH 28.5 28.2 27.9  MCHC 31.7 31.3 31.2  RDW 18.5* 18.2* 18.4*  PLT 160 170 187   BNP Recent Labs  Lab 04/25/20 0053  BNP 2,348.1*    DDimer No results for input(s): DDIMER in the last 168 hours.   Radiology/Studies:  CT Angio Chest PE W and/or Wo  Contrast  Result Date: 04/24/2020 CLINICAL DATA:  Sepsis, weakness, shortness of breath, hypoxia EXAM: CT ANGIOGRAPHY CHEST WITH CONTRAST TECHNIQUE: Multidetector CT imaging of the chest was performed using the standard protocol during bolus administration of intravenous contrast. Multiplanar CT image reconstructions and MIPs were obtained to evaluate the vascular anatomy. CONTRAST:  04/27/20 OMNIPAQUE IOHEXOL 350 MG/ML SOLN COMPARISON:  06/02/2018 FINDINGS: Cardiovascular: This is a technically adequate evaluation of the pulmonary vasculature. There are no filling defects or pulmonary emboli. Marked enlargement of the main pulmonary artery is unchanged, consistent with pulmonary arterial hypertension. Stable cardiomegaly, with prominent right ventricular dilatation. Normal caliber of the thoracic aorta. Mediastinum/Nodes: No enlarged mediastinal, hilar, or axillary lymph nodes. Thyroid gland, trachea, and esophagus demonstrate no significant findings. Lungs/Pleura: Diffuse background emphysema unchanged. Scattered hypoventilatory changes are seen within the dependent lower lobes. No airspace disease, effusion, or pneumothorax. Central airways are patent. Upper Abdomen: Reflux of contrast into the hepatic veins consistent with cardiac dysfunction. Please refer to dedicated CT abdomen and pelvis report. Musculoskeletal: There are no acute or destructive bony lesions. Spinal stimulator is seen within the thoracic central canal, lead at the T9 level. Reconstructed images demonstrate no additional findings. Review of the MIP images confirms the above findings. IMPRESSION: 1. No evidence of pulmonary embolus. 2. Stable dilatation of the main pulmonary artery consistent with pulmonary arterial hypertension. 3. Marked cardiomegaly and right ventricular dilatation, with reflux of contrast into the hepatic veins compatible with cardiac dysfunction. 4.  Emphysema (ICD10-J43.9). Electronically Signed   By: M.D.    On: 04/24/2020 17:40   CT ABDOMEN PELVIS W CONTRAST  Result Date: 04/24/2020 CLINICAL DATA:  Nausea and vomiting, weakness, decubitus ulcer EXAM: CT  ABDOMEN AND PELVIS WITH CONTRAST TECHNIQUE: Multidetector CT imaging of the abdomen and pelvis was performed using the standard protocol following bolus administration of intravenous contrast. CONTRAST:  OMNIPAQUE IOHEXOL 350 MG/ML SOLN COMPARISON:  None. FINDINGS: Lower chest: Hypoventilatory changes are seen at the lung bases. The heart is enlarged. Please refer to dedicated chest CT report. Hepatobiliary: Dependent calcified gallstone without cholecystitis. The liver is unremarkable. Pancreas: Unremarkable. No pancreatic ductal dilatation or surrounding inflammatory changes. Spleen: Normal in size without focal abnormality. Adrenals/Urinary Tract: Evaluation of the kidneys limited due to streak artifact from orthopedic hardware. There are multiple areas of bilateral renal cortical scarring. No evidence of hydronephrosis. The adrenals are grossly unremarkable. Bladder is normal. Stomach/Bowel: No bowel obstruction or ileus. Moderate retained stool throughout the colon. Normal appendix right lower quadrant. No bowel wall thickening or inflammatory change. Vascular/Lymphatic: Venous stent identified in the left external iliac and common iliac veins. No significant atherosclerosis. No pathologic adenopathy. Reproductive: Uterus is enlarged, with a dominant intramural fibroid measuring 7.2 x 6.7 by 6.0 cm. No adnexal masses. Other: No free fluid or free gas. No abdominal wall hernia. Musculoskeletal: Extensive postsurgical changes are seen from multilevel lumbar fusion. Spinal stimulator is seen entering the central canal at approximately L2, lead extending superiorly in the thoracic central canal. There are no acute bony abnormalities. Severe bilateral hip osteoarthritis is noted, left greater than right. Reconstructed images demonstrate no additional findings.  IMPRESSION: 1. Cholelithiasis without cholecystitis. 2. Enlarged fibroid uterus. 3. Moderate fecal retention. Electronically Signed   By: Sharlet Salina M.D.   On: 04/24/2020 17:45   DG Chest Port 1 View  Result Date: 04/25/2020 CLINICAL DATA:  64 year old female central line placement. EXAM: PORTABLE CHEST 1 VIEW COMPARISON:  CTA chest and portable chest yesterday. FINDINGS: Portable AP supine view at 0433 hours. Right IJ central line placed, tip at the level of the carina/SVC. Mildly improved lung volumes. Stable cardiomegaly and mediastinal contours. Visualized tracheal air column is within normal limits. No pneumothorax. Regressed atelectasis. No new pulmonary opacity. IMPRESSION: 1. Right IJ central line placed, tip at the SVC level. No pneumothorax. 2. Improved lung volumes and ventilation. No new cardiopulmonary abnormality. Electronically Signed   By: Odessa Fleming M.D.   On: 04/25/2020 04:50   DG Chest Port 1 View  Result Date: 04/24/2020 CLINICAL DATA:  Sepsis. EXAM: PORTABLE CHEST 1 VIEW COMPARISON:  October 19, 2019. FINDINGS: Stable cardiomegaly. No pneumothorax or pleural effusion is noted. Stable small interstitial densities are noted throughout both lungs which may represent scarring, although superimposed acute inflammation or edema cannot be excluded. Bony thorax is unremarkable. IMPRESSION: Stable small interstitial densities are noted throughout both lungs which may represent scarring, although superimposed acute inflammation or edema cannot be excluded. Electronically Signed   By: Lupita Raider M.D.   On: 04/24/2020 14:36   ECHOCARDIOGRAM COMPLETE  Result Date: 04/25/2020    ECHOCARDIOGRAM REPORT   Patient Name:   ONYINYECHI HUANTE Goforth Date of Exam: 04/25/2020 Medical Rec #:  [id number]          Height:       65.0 in Accession #:    [id number]         Weight:       232.6 lb Date of Birth:  April 11, 1956         BSA:          2.109 m Patient Age:    83 years  BP:           156/77 mmHg  Patient Gender: F                  HR:           50 bpm. Exam Location:  [facility] Procedure: 2D Echo, Cardiac Doppler and Color Doppler Indications:     CHF- acute systolic 428.21  History:         Patient has prior history of Echocardiogram examinations, most                  recent 06/02/2018. CHF; COPD and Pulmonary HTN.  Sonographer:     Cristela Blue RDCS (AE) Referring Phys:  [id number] BRENDA MORRISON Diagnosing Phys: Julien Nordmann MD  Sonographer Comments: Suboptimal apical window. Image acquisition challenging due to COPD. IMPRESSIONS  1. Left ventricular ejection fraction, by estimation, is 35 to 40%. The left ventricle has moderately decreased function. The left ventricle demonstrates global hypokinesis. The left ventricular internal cavity size was mildly dilated. Left ventricular diastolic parameters are consistent with Grade I diastolic dysfunction (impaired relaxation).  2. Right ventricular systolic function is moderately depressed. The right ventricular size is moderately dilated. There is severely elevated pulmonary artery systolic pressure.  3. Left atrial size was mildly dilated.  4. Tricuspid valve regurgitation is mild to moderate.  5. The inferior vena cava is dilated in size with >50% respiratory variability, suggesting right atrial pressure of 8 mmHg. FINDINGS  Left Ventricle: Left ventricular ejection fraction, by estimation, is 35 to 40%. The left ventricle has moderately decreased function. The left ventricle demonstrates global hypokinesis. The left ventricular internal cavity size was mildly dilated. There is no left ventricular hypertrophy. Left ventricular diastolic parameters are consistent with Grade I diastolic dysfunction (impaired relaxation). Right Ventricle: The right ventricular size is moderately enlarged. No increase in right ventricular wall thickness. Right ventricular systolic function is moderately reduced. There is severely elevated pulmonary artery systolic pressure. The  tricuspid regurgitant velocity is 3.68 m/s, and with an assumed right atrial pressure of 10 mmHg, the estimated right ventricular systolic pressure is 64.2 mmHg. Left Atrium: Left atrial size was mildly dilated. Right Atrium: Right atrial size was normal in size. Pericardium: There is no evidence of pericardial effusion. Mitral Valve: The mitral valve is normal in structure. Normal mobility of the mitral valve leaflets. No evidence of mitral valve regurgitation. No evidence of mitral valve stenosis. Tricuspid Valve: The tricuspid valve is normal in structure. Tricuspid valve regurgitation is mild to moderate. No evidence of tricuspid stenosis. Aortic Valve: The aortic valve was not well visualized. Aortic valve regurgitation is not visualized. No aortic stenosis is present. Aortic valve mean gradient measures 3.0 mmHg. Aortic valve peak gradient measures 5.8 mmHg. Aortic valve area, by VTI measures 1.83 cm. Pulmonic Valve: The pulmonic valve was normal in structure. Pulmonic valve regurgitation is not visualized. No evidence of pulmonic stenosis. Aorta: The aortic root is normal in size and structure. Venous: The inferior vena cava is dilated in size with greater than 50% respiratory variability, suggesting right atrial pressure of 8 mmHg. IAS/Shunts: No atrial level shunt detected by color flow Doppler.  LEFT VENTRICLE PLAX 2D LVIDd:         5.00 cm  Diastology LVIDs:         4.10 cm  LV e' lateral:   6.31 cm/s LV PW:         1.38 cm  LV E/e' lateral: 7.3 LV IVS:  0.84 cm LVOT diam:     2.10 cm LV SV:         41 LV SV Index:   20 LVOT Area:     3.46 cm  RIGHT VENTRICLE RV Basal diam:  5.42 cm RV S prime:     8.81 cm/s TAPSE (M-mode): 4.8 cm LEFT ATRIUM              Index       RIGHT ATRIUM           Index LA diam:        4.30 cm  2.04 cm/m  RA Area:     20.90 cm LA Vol (A2C):   117.0 ml 55.47 ml/m RA Volume:   60.90 ml  28.87 ml/m LA Vol (A4C):   58.4 ml  27.69 ml/m LA Biplane Vol: 85.5 ml  40.54 ml/m   AORTIC VALVE AV Area (Vmax):    1.32 cm AV Area (Vmean):   1.38 cm AV Area (VTI):     1.83 cm AV Vmax:           120.00 cm/s AV Vmean:          82.100 cm/s AV VTI:            0.225 m AV Peak Grad:      5.8 mmHg AV Mean Grad:      3.0 mmHg LVOT Vmax:         45.70 cm/s LVOT Vmean:        32.600 cm/s LVOT VTI:          0.119 m LVOT/AV VTI ratio: 0.53  AORTA Ao Root diam: 3.10 cm MITRAL VALVE               TRICUSPID VALVE MV Area (PHT): 7.22 cm    TR Peak grad:   54.2 mmHg MV Decel Time: 105 msec    TR Vmax:        368.00 cm/s MV E velocity: 46.30 cm/s MV A velocity: 84.00 cm/s  SHUNTS MV E/A ratio:  0.55        Systemic VTI:  0.12 m                            Systemic Diam: 2.10 cm Julien Nordmann MD Electronically signed by Julien Nordmann MD Signature Date/Time: 04/25/2020/11:15:50 AM    Final      Assessment and Plan:   1. Urosepsis/decubitus ulcer/bacteremia Blood pressure has stabilized, pressors being weaned -Would minimize IV fluids at this time given markedly elevated right heart pressures on echo --On broad-spectrum antibiotics -High risk of recurrent UTI given her other comorbid issues Is essentially bedbound, limited by decubitus ulcers, orthopedic issues, pain control issues  2.  Dilated cardiomyopathy Followed at [facility].  There are notes indicating prior ejection fraction in the 50% range, now down to 35% --Would look to restart her outpatient medications once blood pressure stabilizes and off pressors --- As outpatient is on bisoprolol 5 daily, Entresto 24/26 twice daily, --Pressures by A-line still 90-100 systolic Does not appear that she was on Lasix as an outpatient on her visit Mar 11, 2020 with cardiology, -IV Lasix will be needed here once blood pressure stabilizes  3.  Pulmonary hypertension With moderately depressed RV function, severely elevated right ventricular systolic pressures Pressures estimated 64 mmHg --Lasix as above once blood pressure stabilizes and  bacteremia/sepsis symptoms improve and off pressors  4.  COPD Covid infection December 2020, has been on 4 L since that time As outpatient has been seeing pulmonary Symptoms likely exacerbated by elevated right heart pressures as seen on echo.  This could explain her elevated oxygen demands since December   Total encounter time more than 110 minutes  Greater than 50% was spent in counseling and coordination of care with the patient   For questions or updates, please contact [facility] Please consult [email or web address] for contact info under    Signed, Julien Nordmannimothy Millisa Giarrusso, MD  04/25/2020 1:54 PM

## 2020-04-25 NOTE — Progress Notes (Signed)
*  PRELIMINARY RESULTS* Echocardiogram 2D Echocardiogram has been performed.  Regina White 04/25/2020, 9:55 AM

## 2020-04-25 NOTE — Consult Note (Signed)
Consultation Note Date: 04/25/2020   Patient Name: Regina White  DOB: 1956-01-25  MRN: [id number]  Age / Sex: 64 y.o., female  PCP: Jacklynn Barnacle, MD Referring Physician: Fritzi Mandes, MD  Reason for Consultation: Establishing goals of care  HPI/Patient Profile: 64 y.o. female  with past medical history of COPD on 3L oxygen at home, CHF with EF of 35%, pulmonary htn, and chronic pain admitted on 04/24/2020 with pain, shortness of breath, and weakness. Diagnosed with septic shock with acute on chronic respiratory failure. Found to have UTI and decubitus ulcer. Currently requiring vasopressor support.  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 She has an intrathecal morphine pump but is trying to wean off of it so she can be seen by a different pain management group. PMT consulted for [place] discussion.  Clinical Assessment and Goals of Care: I have reviewed medical records including EPIC notes, labs and imaging, received report from RN and Dr. Posey Pronto, assessed the patient and then met with patient and spouse Mikki Santee to discuss diagnosis prognosis, [place], EOL wishes, disposition and options.  I introduced Palliative Medicine as specialized medical care for people living with serious illness. It focuses on providing relief from the symptoms and stress of a serious illness. The goal is to improve quality of life for both the patient and the family.  They tell me patient has been bedbound for almost a year and is dependent on other for daily cares. She is able to feed herself and has an "okay" appetite.    We discussed patient's current illness and what it means in the larger context of patient's on-going co-morbidities.  Natural disease trajectory and expectations at EOL were discussed.  I attempted to elicit values and goals of care important to the patient.  Patient tells me she hopes to get better - she  wants to work with PT and is hopeful to regain some strength. She tells me "I'm only 63, I'm not ready to die".  The difference between aggressive medical intervention and comfort care was considered in light of the patient's goals of care. Patient is interested in all medical interventions offered to prolong life. She is interested in full code status.   Discussed with patient/family the importance of continued conversation with family and the medical providers regarding overall plan of care and treatment options, ensuring decisions are within the context of the patients values and GOCs.     Palliative Care services outpatient were explained and offered. She is agreeable to palliative care seeing outpatient.   Patient does NOT want to go to SNF rehab - insists on going home with spouse. Spouse agrees.   Patient very tearful and emotional throughout visit. Therapeutic listening and emotional support provided.   Questions and concerns were addressed. The family was encouraged to call with questions or concerns.   Primary Decision Maker PATIENT    SUMMARY OF RECOMMENDATIONS   - full code/full scope - agrees to palliative care seeing at home - refuses rehab - only wants to go  Home with spouse and spouse agrees - will f/u next week - with ongoing pain but being managed with IV morphine - will hold off on changes currently as patient is still on vasopressors  Code Status/Advance Care Planning:  Full code  Additional Recommendations (Limitations, Scope, Preferences):  Full Scope Treatment   Discharge Planning: Home with Home Health and palliative     Primary Diagnoses: Present  on Admission:  Sepsis secondary to UTI ([place])  Sepsis ([place])  Hyponatremia  Acute respiratory failure with hypoxia (HCC)  Hypothyroidism  Hypertension, benign  Heart failure with preserved ejection fraction (HCC)  Chronic pain   I have reviewed the medical record, interviewed the patient and  family, and examined the patient. The following aspects are pertinent.  Past Medical History:  Diagnosis Date   Asthma    CHF (congestive heart failure) (HCC)    Chronic back pain    COPD (chronic obstructive pulmonary disease) (HCC)    Pulmonary hypertension (HCC)    Social History   Socioeconomic History   Marital status: Married    Spouse name: Not on file   Number of children: Not on file   Years of education: Not on file   Highest education level: Not on file  Occupational History   Not on file  Tobacco Use   Smoking status: Former Smoker    Packs/day: 1.00    Years: 30.00    Pack years: 30.00    Types: Cigarettes    Quit date: 06/09/1999    Years since quitting: 20.8   Smokeless tobacco: Never Used  Scientific laboratory technician Use: Never used  Substance and Sexual Activity   Alcohol use: No   Drug use: Never   Sexual activity: Not Currently  Other Topics Concern   Not on file  Social History Narrative   Living at [employer] at this time   Social Determinants of Radio broadcast assistant Strain:    Difficulty of Paying Living Expenses:   Food Insecurity:    Worried About Charity fundraiser in the Last Year:    Arboriculturist in the Last Year:   Transportation Needs:    Film/video editor (Medical):    Lack of Transportation (Non-Medical):   Physical Activity:    Days of Exercise per Week:    Minutes of Exercise per Session:   Stress:    Feeling of Stress :   Social Connections:    Frequency of Communication with Friends and Family:    Frequency of Social Gatherings with Friends and Family:    Attends Religious Services:    Active Member of Clubs or Organizations:    Attends Music therapist:    Marital Status:    Family History  Problem Relation Age of Onset   Hypertension Mother    Liver cancer Father    Scheduled Meds:  Chlorhexidine Gluconate Cloth  6 each Topical Q0600   cholecalciferol   2,000 Units Oral Daily   famotidine  20 mg Oral Daily   heparin injection (subcutaneous)  5,000 Units Subcutaneous Q8H   hydrocortisone sod succinate (SOLU-CORTEF) inj  50 mg Intravenous Q6H   levothyroxine  200 mcg Oral Q0600   melatonin  5 mg Oral QHS   mometasone-formoterol  2 puff Inhalation BID   montelukast  10 mg Oral QHS   traZODone  150 mg Oral QHS   Continuous Infusions:  sodium chloride 10 mL/hr at 04/25/20 1600   cefTRIAXone (ROCEPHIN)  IV Stopped (04/25/20 1148)   linezolid (ZYVOX) IV Stopped (04/25/20 1346)   phenylephrine (NEO-SYNEPHRINE) Adult infusion 10 mcg/min (04/25/20 1600)   vasopressin (PITRESSIN) infusion - *FOR SHOCK* Stopped (04/25/20 1058)   PRN Meds:.acetaminophen **OR** acetaminophen, albuterol, fentaNYL (SUBLIMAZE) injection, guaiFENesin-dextromethorphan, ipratropium, morphine injection, ondansetron **OR** ondansetron (ZOFRAN) IV, polyethylene glycol, simethicone Allergies  Allergen Reactions   Penicillins Anaphylaxis    Breathing problems  Did it involve swelling of the face/tongue/throat, SOB, or low BP? Yes Did it involve sudden or severe rash/hives, skin peeling, or any reaction on the inside of your mouth or nose? Unknown Did you need to seek medical attention at a hospital or doctor's office? Unknown When did it last happen?unknown If all above answers are NO, may proceed with cephalosporin use. Patient tolerated Ceftriaxone September 2020   Erythromycin Nausea And Vomiting and Rash    STOMACH PAIN   Levofloxacin Other (See Comments)    Muscle aches   Lidocaine Rash   Clindamycin Other (See Comments)    Recurrent C diff   Levothyroxine Other (See Comments)    Vaginal bleeding Reaction to generic (pt currently takes name brand Synthroid)   Tiotropium Other (See Comments)    Coughing up blood   Doxycycline Nausea And Vomiting   Sulfa Antibiotics Rash   Review of Systems  Constitutional: Positive for fatigue.    Respiratory: Negative for shortness of breath.   Musculoskeletal: Positive for arthralgias.    Physical Exam Constitutional:      General: She is not in acute distress. Pulmonary:     Effort: Pulmonary effort is normal. No respiratory distress.  Skin:    General: Skin is warm and dry.  Neurological:     Mental Status: She is alert and oriented to person, place, and time.     Vital Signs: BP (!) 148/85    Pulse 68    Temp 97.7 F (36.5 C) (Oral)    Resp (!) 21    Ht 5' 5"  (1.651 m)    Wt 105.5 kg    LMP  (LMP Unknown)    SpO2 94%    BMI 38.70 kg/m  Pain Scale: 0-10 POSS *See Group Information*: 1-Acceptable,Awake and alert Pain Score: Asleep   SpO2: SpO2: 94 % O2 Device:SpO2: 94 % O2 Flow Rate: .O2 Flow Rate (L/min): 3 L/min  IO: Intake/output summary:   Intake/Output Summary (Last 24 hours) at 04/25/2020 1712 Last data filed at 04/25/2020 1600 Gross per 24 hour  Intake 2670.38 ml  Output 550 ml  Net 2120.38 ml    LBM: Last BM Date: 04/23/20 Baseline Weight: Weight: 93.4 kg Most recent weight: Weight: 105.5 kg     Palliative Assessment/Data: PPS 30%    Time Total: 50 minutes Greater than 50%  of this time was spent counseling and coordinating care related to the above assessment and plan.  Juel Burrow, DNP, AGNP-C Palliative Medicine Team [phone] Pager: [phone]

## 2020-04-25 NOTE — Consult Note (Signed)
WOC Nurse Consult Note: Reason for Consult:moisture associated skin damage to back, buttocks and perineum.  Stage 2 pressure injury to left buttocks.   Wound type:pressure and moisture Pressure Injury POA: Yes Measurement: left buttock  2 cm x 0.3 cm x 0.2 cm  Generalized partial thickness skin loss to back and perineal skin from exposure to incontinence.  Wound PZP:SUGA and moist Drainage (amount, consistency, odor) weeping Periwound:blanchable erythema Dressing procedure/placement/frequency: Keep skin clean and dry and incontinence cleaned promptly.  Is on low air loss bed.  No disposable briefs or underpads..  Barrier cream twice daily and PRn soilage.  Turn and reposition every two hours.  Will not follow at this time.  Please re-consult if needed.  [place]le [place] MSN, RN, FNP-BC CWON Wound, Ostomy, Continence Nurse Pager [phone]

## 2020-04-25 NOTE — Progress Notes (Signed)
PHARMACY - PHYSICIAN COMMUNICATION CRITICAL VALUE ALERT - BLOOD CULTURE IDENTIFICATION (BCID)  Regina White is an 64 y.o. female who presented to [facility] on 04/24/2020 with a chief complaint of generalized weakness  Assessment:  Patient in septic shock on vasopressin/phenylephrine s/p CVC. 1/4 GPC Strep agalactiae. Patient has severe allx to [facility].  Name of physician (or Provider) Contacted: Harlon Ditty  Current antibiotics: vanc/aztreonam/flagyl  Changes to prescribed antibiotics recommended:  Recommended to d/c aztreonam/flagyl and continue vanc given severe PCN allx; however, NP felt strongly about continuing broad spectrum abx for now given multiple possible sources of infx (decubitus ulcer and UTI). Will continue broad spectrum coverage and continue to monitor.  Results for orders placed or performed during the hospital encounter of 04/24/20  Blood Culture ID Panel (Reflexed) (Collected: 04/24/2020  1:30 PM)  Result Value Ref Range   Enterococcus species NOT DETECTED NOT DETECTED   Listeria monocytogenes NOT DETECTED NOT DETECTED   Staphylococcus species NOT DETECTED NOT DETECTED   Staphylococcus aureus (BCID) NOT DETECTED NOT DETECTED   Streptococcus species DETECTED (A) NOT DETECTED   Streptococcus agalactiae DETECTED (A) NOT DETECTED   Streptococcus pneumoniae NOT DETECTED NOT DETECTED   Streptococcus pyogenes NOT DETECTED NOT DETECTED   Acinetobacter baumannii NOT DETECTED NOT DETECTED   Enterobacteriaceae species NOT DETECTED NOT DETECTED   Enterobacter cloacae complex NOT DETECTED NOT DETECTED   Escherichia coli NOT DETECTED NOT DETECTED   Klebsiella oxytoca NOT DETECTED NOT DETECTED   Klebsiella pneumoniae NOT DETECTED NOT DETECTED   Proteus species NOT DETECTED NOT DETECTED   Serratia marcescens NOT DETECTED NOT DETECTED   Haemophilus influenzae NOT DETECTED NOT DETECTED   Neisseria meningitidis NOT DETECTED NOT DETECTED   Pseudomonas aeruginosa NOT DETECTED  NOT DETECTED   Candida albicans NOT DETECTED NOT DETECTED   Candida glabrata NOT DETECTED NOT DETECTED   Candida krusei NOT DETECTED NOT DETECTED   Candida parapsilosis NOT DETECTED NOT DETECTED   Candida tropicalis NOT DETECTED NOT DETECTED   Thomasene Ripple, PharmD, BCPS Clinical Pharmacist 04/25/2020  4:44 AM

## 2020-04-25 NOTE — Consult Note (Signed)
[facility] SURGICAL CONSULTATION NOTE (initial) - cpt: 99254   HISTORY OF PRESENT ILLNESS (HPI):  64 y.o. female presented to [facility] ED yesterday for evaluation of weakness. Patient was sent to the ED after evaluation from her home health nurse was concerning forincreasing SOB and worsening appearance. She has chronic SOB secondary to COPD but she feels this was worse. Additionally, she had been reporting failure to thrive symptoms including worsening pain with movement, fatigue, chills, and malaise over the last few weeks. She had been seen reportedly of a sacral decubitus ulceration but reports she has ben immobile over the last month or so secondary to chronic hip pain. She did not have any fever, nausea, emesis, or abdominal pain. Presentation to the ED was concerning for hypotension, bradycardia, and did require supplemental O2. Initial leukocytosis was 16k (which 17K this morning), lactic acidosis to 2.5 (now resolved), renal function is her baseline, BNP is 2348. She is currently requiring phenylephrine at 70 mcg/min and vasopressin at 0.03. She did have a CT Abdomen/Pelvis which was not concerning for any infection in her sacral region. BCx were positive for streptococcus agalactiae.  Surgery is consulted by hospitliast physician Dr. Enedina Finner, MD in this context for evaluation and management of evaluation of potential sacral decubitus infection/ulceration/abscess.  PAST MEDICAL HISTORY (PMH):  Past Medical History:  Diagnosis Date  . Asthma   . CHF (congestive heart failure) (HCC)   . Chronic back pain   . COPD (chronic obstructive pulmonary disease) (HCC)   . Pulmonary hypertension (HCC)      PAST SURGICAL HISTORY (PSH):  Past Surgical History:  Procedure Laterality Date  . BACK SURGERY       MEDICATIONS:  Prior to Admission medications   Medication Sig Start Date End Date Taking? Authorizing Provider  albuterol (VENTOLIN HFA) 108 (90 Base) MCG/ACT inhaler  Inhale 2 puffs into the lungs every 6 (six) hours as needed for wheezing or shortness of breath. 10/25/19  Yes Tyrone Nine, MD  apixaban (ELIQUIS) 2.5 MG TABS tablet Take 1 tablet (2.5 mg total) by mouth 2 (two) times daily. 10/25/19  Yes Tyrone Nine, MD  bisoprolol (ZEBETA) 5 MG tablet Take 5 mg by mouth daily. 03/12/20  Yes [provider]  Cholecalciferol (VITAMIN D) 50 MCG (2000 UT) tablet Take 2,000 Units by mouth daily.   Yes [provider]  ENTRESTO 24-26 MG Take 1 tablet by mouth 2 (two) times daily. 04/13/20  Yes [provider]  famotidine (PEPCID) 20 MG tablet Take 1 tablet (20 mg total) by mouth 2 (two) times daily. Patient taking differently: Take 20 mg by mouth daily.  10/25/19  Yes Tyrone Nine, MD  Fluticasone-Salmeterol (ADVAIR) 500-50 MCG/DOSE AEPB Inhale 1 puff into the lungs 2 (two) times daily. 10/25/19  Yes Tyrone Nine, MD  guaiFENesin-dextromethorphan (ROBITUSSIN DM) 100-10 MG/5ML syrup Take 5 mLs by mouth every 4 (four) hours as needed for cough. 10/25/19  Yes Tyrone Nine, MD  ipratropium (ATROVENT) 0.02 % nebulizer solution Inhale into the lungs. 11/23/19 11/22/20 Yes [provider]  levothyroxine (SYNTHROID) 200 MCG tablet Take 1 tablet (200 mcg total) by mouth daily at 6 (six) AM. 10/25/19  Yes Tyrone Nine, MD  montelukast (SINGULAIR) 10 MG tablet Take 1 tablet (10 mg total) by mouth at bedtime. 10/25/19  Yes Tyrone Nine, MD  NON FORMULARY by Intrathecal Infusion route continuous. MORPHINE / BUPIVACAINE INTRATHECAL PUMP    Yes [provider]  polyethylene  glycol (MIRALAX / GLYCOLAX) 17 g packet Take 17 g by mouth daily as needed (constipation).   Yes [provider]  traZODone (DESYREL) 50 MG tablet Take 150 mg by mouth at bedtime.  04/17/20  Yes [provider]     ALLERGIES:  Allergies  Allergen Reactions  . Penicillins Anaphylaxis    Breathing problems Did it involve swelling of the  face/tongue/throat, SOB, or low BP? Yes Did it involve sudden or severe rash/hives, skin peeling, or any reaction on the inside of your mouth or nose? Unknown Did you need to seek medical attention at a hospital or doctor's office? Unknown When did it last happen?unknown If all above answers are "NO", may proceed with cephalosporin use. Patient tolerated Ceftriaxone September 2020  . Erythromycin Nausea And Vomiting and Rash    STOMACH PAIN  . Levofloxacin Other (See Comments)    Muscle aches  . Lidocaine Rash  . Clindamycin Other (See Comments)    Recurrent C diff  . Levothyroxine Other (See Comments)    Vaginal bleeding Reaction to generic (pt currently takes name brand Synthroid)  . Tiotropium Other (See Comments)    Coughing up blood  . Doxycycline Nausea And Vomiting  . Sulfa Antibiotics Rash     SOCIAL HISTORY:  Social History   Socioeconomic History  . Marital status: Married    Spouse name: Not on file  . Number of children: Not on file  . Years of education: Not on file  . Highest education level: Not on file  Occupational History  . Not on file  Tobacco Use  . Smoking status: Former Smoker    Packs/day: 1.00    Years: 30.00    Pack years: 30.00    Types: Cigarettes    Quit date: 06/09/1999    Years since quitting: 20.8  . Smokeless tobacco: Never Used  Vaping Use  . Vaping Use: Never used  Substance and Sexual Activity  . Alcohol use: No  . Drug use: Never  . Sexual activity: Not Currently  Other Topics Concern  . Not on file  Social History Narrative   Living at [facility] at this time   Social Determinants of Health   Financial Resource Strain:   . Difficulty of Paying Living Expenses:   Food Insecurity:   . Worried About Programme researcher, broadcasting/film/video in the Last Year:   . Barista in the Last Year:   Transportation Needs:   . Freight forwarder (Medical):   Marland Kitchen Lack of Transportation (Non-Medical):   Physical Activity:   . Days of  Exercise per Week:   . Minutes of Exercise per Session:   Stress:   . Feeling of Stress :   Social Connections:   . Frequency of Communication with Friends and Family:   . Frequency of Social Gatherings with Friends and Family:   . Attends Religious Services:   . Active Member of Clubs or Organizations:   . Attends Banker Meetings:   Marland Kitchen Marital Status:   Intimate Partner Violence:   . Fear of Current or Ex-Partner:   . Emotionally Abused:   Marland Kitchen Physically Abused:   . Sexually Abused:      FAMILY HISTORY:  Family History  Problem Relation Age of Onset  . Hypertension Mother   . Liver cancer Father       REVIEW OF SYSTEMS:  Review of Systems  Constitutional: Positive for chills and malaise/fatigue. Negative for fever and weight  loss.  HENT: Negative for congestion and sore throat.   Respiratory: Positive for shortness of breath. Negative for cough.   Cardiovascular: Negative for chest pain and palpitations.  Gastrointestinal: Negative for abdominal pain, nausea and vomiting.  All other systems reviewed and are negative.   VITAL SIGNS:  Temp:  [98.5 F (36.9 C)-98.9 F (37.2 C)] 98.9 F (37.2 C) (06/18 0400) Pulse Rate:  [32-78] 65 (06/18 0700) Resp:  [17-40] 25 (06/18 0700) BP: (67-148)/(32-120) 136/79 (06/18 0700) SpO2:  [86 %-95 %] 92 % (06/18 0700) Arterial Line BP: (69-295)/(26-285) 114/59 (06/18 0700) Weight:  [93.4 kg-105.5 kg] 105.5 kg (06/17 2100)     Height: 5\' 5"  (165.1 cm) Weight: 105.5 kg BMI (Calculated): 38.7   INTAKE/OUTPUT:  06/17 0701 - 06/18 0700 In: 2374.4 [I.V.:291.2; IV Piggyback:2083.1] Out: 125 [Urine:125]  PHYSICAL EXAM:  Physical Exam Vitals and nursing note reviewed. Exam conducted with a chaperone present.  Constitutional:      General: She is not in acute distress.    Appearance: Normal appearance. She is obese. She is not ill-appearing.  HENT:     Head: Normocephalic and atraumatic.  Eyes:     Conjunctiva/sclera:  Conjunctivae normal.     Pupils: Pupils are equal, round, and reactive to light.  Cardiovascular:     Rate and Rhythm: Normal rate and regular rhythm.     Pulses:          Dorsalis pedis pulses are 1+ on the right side and 1+ on the left side.       Posterior tibial pulses are 1+ on the right side and 1+ on the left side.  Pulmonary:     Effort: Pulmonary effort is normal. No respiratory distress.     Comments: On nasal cannula Genitourinary:    Comments: Deferred Skin:    General: Skin is warm and dry.     Coloration: Skin is not pale.     Findings: Erythema present.       Neurological:     General: No focal deficit present.     Mental Status: She is alert and oriented to person, place, and time.  Psychiatric:        Mood and Affect: Mood normal.        Behavior: Behavior normal.    Back (04/25/2020):      Labs:  CBC Latest Ref Rng & Units 04/25/2020 04/24/2020 04/24/2020  WBC 4.0 - 10.5 K/uL 17.9(H) 16.7(H) 12.7(H)  Hemoglobin 12.0 - 15.0 g/dL 04/26/2020 25.3 66.4  Hematocrit 36 - 46 % 39.8 43.1 46.1(H)  Platelets 150 - 400 K/uL 187 170 160   CMP Latest Ref Rng & Units 04/25/2020 04/24/2020 04/24/2020  Glucose 70 - 99 mg/dL 04/26/2020) 474(Q) 595(G)  BUN 8 - 23 mg/dL 387(F) 64(P) 32(R)  Creatinine 0.44 - 1.00 mg/dL 51(O) 8.41(Y) 6.06(T  Sodium 135 - 145 mmol/L 138 140 143  Potassium 3.5 - 5.1 mmol/L 4.2 4.3 4.5  Chloride 98 - 111 mmol/L 112(H) 108 112(H)  CO2 22 - 32 mmol/L 20(L) 25 22  Calcium 8.9 - 10.3 mg/dL 8.2(L) 8.4(L) 8.8(L)  Total Protein 6.5 - 8.1 g/dL 5.2(L) 5.8(L) 6.0(L)  Total Bilirubin 0.3 - 1.2 mg/dL 0.9 0.9 0.7  Alkaline Phos 38 - 126 U/L 46 51 58  AST 15 - 41 U/L 10(L) 14(L) 12(L)  ALT 0 - 44 U/L 11 12 12      Imaging studies:   CT Abdomen/Pelvis (04/24/2020) personally reviewed without evidence of soft tissue infection, abscess,  or subcutaneous emphysema, and radiologist report reviewed:  IMPRESSION: 1. Cholelithiasis without cholecystitis. 2. Enlarged  fibroid uterus. 3. Moderate fecal retention.   Assessment/Plan: (ICD-10's: A41.9) 64 y.o. extremely debilitated female with what I suspect is superficial cellulitis to the posterior thigh, buttock, and lower back without any clinical or radiographic imaging of abscess or necrotizing infection. Additionally, her degree of immobility I suspect some of this may be a stage 1 pressure injury and she may have been laying in her own urine at home based on pictures reviewed from admission. We will continue to closely monitor her and I do recommend daily examination of her back as well as trending her leukocytosis, fever curve, and hemodynamics. Should see worsen in these aspects we could consider repeat imaging to re-evaluate for signs of abscess or necrotizing infection. If she did progress to this level I suspect this would be a terminal event given her degree of debilitation at baseline. Regardless, I do think palliative consultation would be reasonable again given her baseline status. Continue IV Abx (Rocephin), follow up blood cultures. Further management per PCCM and primary service. Will we continue to follow along closely.   All of the above findings and recommendations were discussed with the patient and her family (Husband), and all of patient's and her family's questions were answered to their expressed satisfaction.  Thank you for the opportunity to participate in this patient's care.   -- Edison Simon, PA-C [facility] 04/25/2020, 9:28 AM [phone] M-F: 7am - 4pm

## 2020-04-25 NOTE — Consult Note (Signed)
Name: Regina White MRN: [id number] DOB: 1956-02-14    ADMISSION DATE:  04/24/2020 CONSULTATION DATE:  04/25/2020  REFERRING MD :  Sharion Settler, NP  CHIEF COMPLAINT:  Septic Shock  BRIEF PATIENT DESCRIPTION:  64 y.o. Female admitted 04/24/20 with severe sepsis secondary to UTI & Cellulitis (posterior thigh, buttock, lower back).  On 6/18 she became hypotensive requiring vasopressors.  Found to have Streptococcus agalactiae BACTEREMIA.  SIGNIFICANT EVENTS  6/17: Admission to [facility]  6/18: Hypotensive; PCCM consulted for vasopressors, CVC and Arterial line placed 6/18: Streptococcus agalactiae BACTEREMIA.  STUDIES:  6/17: CXR>>Stable cardiomegaly. No pneumothorax or pleural effusion is noted. Stable small interstitial densities are noted throughout both lungs which may represent scarring, although superimposed acute inflammation or edema cannot be excluded. Bony thorax is Unremarkable. 6/17: CTA Chest>>1. No evidence of pulmonary embolus. 2. Stable dilatation of the main pulmonary artery consistent with pulmonary arterial hypertension. 3. Marked cardiomegaly and right ventricular dilatation, with reflux of contrast into the hepatic veins compatible with cardiac dysfunction. 4.  Emphysema 6/17: CT Abdomen/Pelvis>>1. Cholelithiasis without cholecystitis. 2. Enlarged fibroid uterus. 3. Moderate fecal retention.  CULTURES: SARS-CoV-2 PCR 6/17>> negative Blood culture x2 6/17>>Streptococcus agalactiae BACTEREMIA. Urine 6/17>> MRSA PCR 6/7>>  ANTIBIOTICS: Azteonam 6/17>>6/18 Flagyl 6/17>>6/18 Vancomycin 6/17>>6/18 Ceftriaxone 6/18>>  HISTORY OF PRESENT ILLNESS:   Regina White is a 64 year old female with a past medical history significant for COPD, pulmonary hypertension,HFrEF (EF 35-40%), chronic back pain who presents to [facility] ED on 04/24/2020 with complaints of generalized weakness.  Upon presentation to the ED she was found to be hypotensive with blood  pressure 67/45, HR 78, respiratory rate 48, and O2 saturations 89% on room air.  Initial work-up revealed WBC 16.7, BUN 127, creatinine 1.04, lactic acid 2.5, procalcitonin 7.17.  CT abdomen and pelvis showed Cholithiasis without cholecystitis.  CTA chest was negative for PE.  She was found to have superficial cellulitis to her back, along with a stage II decubitus ulcer.  Urinalysis consistent with UTI.  She met sepsis criteria therefore she received IV fluid resuscitation and broad-spectrum antibiotics.  Her blood pressure improved following IV fluids.  She would is admitted to the stepdown unit by the hospitalist for further work-up and treatment of severe sepsis secondary to UTI and cellulitis, and Acute on Chronic Hypoxic respiratory failure secondary to Acute Decompensated HFrEF.  Early in the morning on 04/25/2020 she became hypotensive, and was found to have BNP 2348.  PCCM was consulted for initiation of vasopressors, and placement of central line and arterial line.  Blood cultures came back positive for Streptococcus agalactiae BACTEREMIA.  General surgery is also been consulted.   PAST MEDICAL HISTORY :   has a past medical history of Asthma, CHF (congestive heart failure) ([place]), Chronic back pain, COPD (chronic obstructive pulmonary disease) ([place]), and Pulmonary hypertension ([place]).  has a past surgical history that includes Back surgery. Prior to Admission medications   Medication Sig Start Date End Date Taking? Authorizing Provider  albuterol (VENTOLIN HFA) 108 (90 Base) MCG/ACT inhaler Inhale 2 puffs into the lungs every 6 (six) hours as needed for wheezing or shortness of breath. 10/25/19  Yes Patrecia Pour, MD  apixaban (ELIQUIS) 2.5 MG TABS tablet Take 1 tablet (2.5 mg total) by mouth 2 (two) times daily. 10/25/19  Yes Patrecia Pour, MD  bisoprolol (ZEBETA) 5 MG tablet Take 5 mg by mouth daily. 03/12/20  Yes [provider]  Cholecalciferol (VITAMIN D) 50 MCG (2000 UT) tablet Take  2,000 Units  by mouth daily.   Yes [provider]  ENTRESTO 24-26 MG Take 1 tablet by mouth 2 (two) times daily. 04/13/20  Yes [provider]  famotidine (PEPCID) 20 MG tablet Take 1 tablet (20 mg total) by mouth 2 (two) times daily. Patient taking differently: Take 20 mg by mouth daily.  10/25/19  Yes Patrecia Pour, MD  Fluticasone-Salmeterol (ADVAIR) 500-50 MCG/DOSE AEPB Inhale 1 puff into the lungs 2 (two) times daily. 10/25/19  Yes Patrecia Pour, MD  guaiFENesin-dextromethorphan (ROBITUSSIN DM) 100-10 MG/5ML syrup Take 5 mLs by mouth every 4 (four) hours as needed for cough. 10/25/19  Yes Patrecia Pour, MD  ipratropium (ATROVENT) 0.02 % nebulizer solution Inhale into the lungs. 11/23/19 11/22/20 Yes [provider]  levothyroxine (SYNTHROID) 200 MCG tablet Take 1 tablet (200 mcg total) by mouth daily at 6 (six) AM. 10/25/19  Yes Patrecia Pour, MD  montelukast (SINGULAIR) 10 MG tablet Take 1 tablet (10 mg total) by mouth at bedtime. 10/25/19  Yes Patrecia Pour, MD  NON FORMULARY by Intrathecal Infusion route continuous. MORPHINE / BUPIVACAINE INTRATHECAL PUMP    Yes [provider]  polyethylene glycol (MIRALAX / GLYCOLAX) 17 g packet Take 17 g by mouth daily as needed (constipation).   Yes [provider]  traZODone (DESYREL) 50 MG tablet Take 150 mg by mouth at bedtime.  04/17/20  Yes [provider]   Allergies  Allergen Reactions   Penicillins Anaphylaxis    Breathing problems Did it involve swelling of the face/tongue/throat, SOB, or low BP? Yes Did it involve sudden or severe rash/hives, skin peeling, or any reaction on the inside of your mouth or nose? Unknown Did you need to seek medical attention at a hospital or doctor's office? Unknown When did it last happen?unknown If all above answers are NO, may proceed with cephalosporin use. Patient tolerated Ceftriaxone September 2020   Erythromycin Nausea And Vomiting and Rash     STOMACH PAIN   Levofloxacin Other (See Comments)    Muscle aches   Lidocaine Rash   Clindamycin Other (See Comments)    Recurrent C diff   Levothyroxine Other (See Comments)    Vaginal bleeding Reaction to generic (pt currently takes name brand Synthroid)   Tiotropium Other (See Comments)    Coughing up blood   Doxycycline Nausea And Vomiting   Sulfa Antibiotics Rash    FAMILY HISTORY:  family history includes Hypertension in her mother; Liver cancer in her father. SOCIAL HISTORY:  reports that she quit smoking about 20 years ago. Her smoking use included cigarettes. She has a 30.00 pack-year smoking history. She has never used smokeless tobacco. She reports that she does not drink alcohol and does not use drugs.   COVID-19 DISASTER DECLARATION:  FULL CONTACT PHYSICAL EXAMINATION WAS NOT POSSIBLE DUE TO TREATMENT OF COVID-19 AND  CONSERVATION OF PERSONAL PROTECTIVE EQUIPMENT, LIMITED EXAM FINDINGS INCLUDE-  Patient assessed or the symptoms described in the history of present illness.  In the context of the Global COVID-19 pandemic, which necessitated consideration that the patient might be at risk for infection with the SARS-CoV-2 virus that causes COVID-19, Institutional protocols and algorithms that pertain to the evaluation of patients at risk for COVID-19 are in a state of rapid change based on information released by regulatory bodies including the CDC and federal and state organizations. These policies and algorithms were followed during the patient's care while in hospital.  REVIEW OF SYSTEMS:   Constitutional: Negative for  fever, chills, weight loss, +malaise/fatigue and diaphoresis.  HENT: Negative for hearing loss, ear pain, nosebleeds, congestion, sore throat, neck pain, tinnitus and ear discharge.   Eyes: Negative for blurred vision, double vision, photophobia, pain, discharge and redness.  Respiratory: Negative for cough, hemoptysis, sputum production, shortness  of breath, wheezing and stridor.   Cardiovascular: Negative for chest pain, palpitations, orthopnea, claudication, leg swelling and PND.  Gastrointestinal: Negative for heartburn, nausea, vomiting, abdominal pain, diarrhea, constipation, blood in stool and melena.  Genitourinary: Negative for dysuria, urgency, frequency, hematuria and flank pain.  Musculoskeletal: Negative for myalgias, +back pain, joint pain and falls.  Skin: Negative for itching and rash.  Neurological: Negative for dizziness, tingling, tremors, sensory change, speech change, focal weakness, seizures, loss of consciousness, weakness and headaches.  Endo/Heme/Allergies: Negative for environmental allergies and polydipsia. Does not bruise/bleed easily.  SUBJECTIVE:  Pt reports back pain  VITAL SIGNS: Temp:  [98.5 F (36.9 C)-98.9 F (37.2 C)] 98.6 F (37 C) (06/18 0300) Pulse Rate:  [32-78] 61 (06/18 0200) Resp:  [17-40] 31 (06/18 0300) BP: (67-145)/(39-120) 70/49 (06/18 0300) SpO2:  [87 %-95 %] 91 % (06/18 0200) Weight:  [93.4 kg-105.5 kg] 105.5 kg (06/17 2100)  PHYSICAL EXAMINATION: General: Acute on chronically ill-appearing female, laying in bed, on nasal cannula, in no acute distress Neuro: Awake, alert and oriented, follows commands, no focal deficits, speech clear HEENT: Atraumatic, normocephalic, neck supple, no JVD Cardiovascular: Regular rate and rhythm, S1-S2, no murmurs, rubs, gallops Lungs:  Clear to auscultation bilaterally, no wheezing or rhonchi Abdomen:  Obese, soft , nontender, no guarding or rebound tenderness, BS+x4 Musculoskeletal:  No deformities, no edema Skin:  Warm and dry.  Superficial cellulitis to posterior thigh, buttock, lower back  Recent Labs  Lab 04/24/20 1330 04/24/20 2219  NA 143 140  K 4.5 4.3  CL 112* 108  CO2 22 25  BUN 40* 40*  CREATININE 0.97 1.04*  GLUCOSE 146* 127*   Recent Labs  Lab 04/24/20 1330 04/24/20 2219  HGB 14.6 13.5  HCT 46.1* 43.1  WBC 12.7* 16.7*   PLT 160 170   CT Angio Chest PE W and/or Wo Contrast  Result Date: 04/24/2020 CLINICAL DATA:  Sepsis, weakness, shortness of breath, hypoxia EXAM: CT ANGIOGRAPHY CHEST WITH CONTRAST TECHNIQUE: Multidetector CT imaging of the chest was performed using the standard protocol during bolus administration of intravenous contrast. Multiplanar CT image reconstructions and MIPs were obtained to evaluate the vascular anatomy. CONTRAST:  172m OMNIPAQUE IOHEXOL 350 MG/ML SOLN COMPARISON:  06/02/2018 FINDINGS: Cardiovascular: This is a technically adequate evaluation of the pulmonary vasculature. There are no filling defects or pulmonary emboli. Marked enlargement of the main pulmonary artery is unchanged, consistent with pulmonary arterial hypertension. Stable cardiomegaly, with prominent right ventricular dilatation. Normal caliber of the thoracic aorta. Mediastinum/Nodes: No enlarged mediastinal, hilar, or axillary lymph nodes. Thyroid gland, trachea, and esophagus demonstrate no significant findings. Lungs/Pleura: Diffuse background emphysema unchanged. Scattered hypoventilatory changes are seen within the dependent lower lobes. No airspace disease, effusion, or pneumothorax. Central airways are patent. Upper Abdomen: Reflux of contrast into the hepatic veins consistent with cardiac dysfunction. Please refer to dedicated CT abdomen and pelvis report. Musculoskeletal: There are no acute or destructive bony lesions. Spinal stimulator is seen within the thoracic central canal, lead at the T9 level. Reconstructed images demonstrate no additional findings. Review of the MIP images confirms the above findings. IMPRESSION: 1. No evidence of pulmonary embolus. 2. Stable dilatation of the main pulmonary artery consistent with pulmonary  arterial hypertension. 3. Marked cardiomegaly and right ventricular dilatation, with reflux of contrast into the hepatic veins compatible with cardiac dysfunction. 4.  Emphysema (ICD10-J43.9).  Electronically Signed   By: Randa Ngo M.D.   On: 04/24/2020 17:40   CT ABDOMEN PELVIS W CONTRAST  Result Date: 04/24/2020 CLINICAL DATA:  Nausea and vomiting, weakness, decubitus ulcer EXAM: CT ABDOMEN AND PELVIS WITH CONTRAST TECHNIQUE: Multidetector CT imaging of the abdomen and pelvis was performed using the standard protocol following bolus administration of intravenous contrast. CONTRAST:  145m OMNIPAQUE IOHEXOL 350 MG/ML SOLN COMPARISON:  None. FINDINGS: Lower chest: Hypoventilatory changes are seen at the lung bases. The heart is enlarged. Please refer to dedicated chest CT report. Hepatobiliary: Dependent calcified gallstone without cholecystitis. The liver is unremarkable. Pancreas: Unremarkable. No pancreatic ductal dilatation or surrounding inflammatory changes. Spleen: Normal in size without focal abnormality. Adrenals/Urinary Tract: Evaluation of the kidneys limited due to streak artifact from orthopedic hardware. There are multiple areas of bilateral renal cortical scarring. No evidence of hydronephrosis. The adrenals are grossly unremarkable. Bladder is normal. Stomach/Bowel: No bowel obstruction or ileus. Moderate retained stool throughout the colon. Normal appendix right lower quadrant. No bowel wall thickening or inflammatory change. Vascular/Lymphatic: Venous stent identified in the left external iliac and common iliac veins. No significant atherosclerosis. No pathologic adenopathy. Reproductive: Uterus is enlarged, with a dominant intramural fibroid measuring 7.2 x 6.7 by 6.0 cm. No adnexal masses. Other: No free fluid or free gas. No abdominal wall hernia. Musculoskeletal: Extensive postsurgical changes are seen from multilevel lumbar fusion. Spinal stimulator is seen entering the central canal at approximately L2, lead extending superiorly in the thoracic central canal. There are no acute bony abnormalities. Severe bilateral hip osteoarthritis is noted, left greater than right.  Reconstructed images demonstrate no additional findings. IMPRESSION: 1. Cholelithiasis without cholecystitis. 2. Enlarged fibroid uterus. 3. Moderate fecal retention. Electronically Signed   By: MRanda NgoM.D.   On: 04/24/2020 17:45   DG Chest Port 1 View  Result Date: 04/24/2020 CLINICAL DATA:  Sepsis. EXAM: PORTABLE CHEST 1 VIEW COMPARISON:  October 19, 2019. FINDINGS: Stable cardiomegaly. No pneumothorax or pleural effusion is noted. Stable small interstitial densities are noted throughout both lungs which may represent scarring, although superimposed acute inflammation or edema cannot be excluded. Bony thorax is unremarkable. IMPRESSION: Stable small interstitial densities are noted throughout both lungs which may represent scarring, although superimposed acute inflammation or edema cannot be excluded. Electronically Signed   By: JMarijo ConceptionM.D.   On: 04/24/2020 14:36    ASSESSMENT / PLAN:  Septic shock Acute Decompensated HFrEF (EF 35-40% on 06/02/18) -Continuous cardiac monitoring -Maintain MAP greater than 65 -Cautious IV fluids given CHF history -Vasopressors as needed to maintain MAP goal -Stress dose steroids -Trend lactic acid -Unable to diurese at this time due to Hypotension -Obtain 2D Echocardiogram  Severe Sepsis and Streptococcus agalactiae BACTEREMIA secondary to UTI & Cellulitis -Monitor fever curve Trend WBCs and procalcitonin -Follow cultures as above -Initially on aztreonam, Flagyl, vancomycin ~transitioned to ceftriaxone -General Surgery consulted, appreciate input -Consider ID consult  Acute on Chronic Hypoxic Respiratory Failure secondary to Acute Decompensated HFrEF (EF 35-40% on 06/02/18) Hx: COPD, Pulmonary HTN, Asthma -Supplemental O2 as needed to maintain O2 saturations 88 to 92% -BiPAP, wean as tolerated -Follow intermittent chest x-ray and ABG as needed -Unable to diurese at this time due to hypotension -As needed  bronchodilators  Hypothyroidism -Continue home synthroid      Pt is critically ill, prognosis  is guarded, She is high risk for cardiac arrest and death   BEST PRACTICES DISPOSITION: ICU GOALS OF CARE: Full code VTE PROPHYLAXIS: Heparin SQ CONSULTS: General Surgery UPDATES: Updated pt at bedside 04/25/20  Darel Hong, [facility] [place] Pager: [phone]  04/25/2020, 4:21 AM

## 2020-04-26 LAB — CBC WITH DIFFERENTIAL/PLATELET
Abs Immature Granulocytes: 0.05 10*3/uL (ref 0.00–0.07)
Basophils Absolute: 0 10*3/uL (ref 0.0–0.1)
Basophils Relative: 0 %
Eosinophils Absolute: 0 10*3/uL (ref 0.0–0.5)
Eosinophils Relative: 0 %
HCT: 38.8 % (ref 36.0–46.0)
Hemoglobin: 12.4 g/dL (ref 12.0–15.0)
Immature Granulocytes: 1 %
Lymphocytes Relative: 8 %
Lymphs Abs: 0.6 10*3/uL — ABNORMAL LOW (ref 0.7–4.0)
MCH: 28.2 pg (ref 26.0–34.0)
MCHC: 32 g/dL (ref 30.0–36.0)
MCV: 88.4 fL (ref 80.0–100.0)
Monocytes Absolute: 0.1 10*3/uL (ref 0.1–1.0)
Monocytes Relative: 2 %
Neutro Abs: 7.2 10*3/uL (ref 1.7–7.7)
Neutrophils Relative %: 89 %
Platelets: 159 10*3/uL (ref 150–400)
RBC: 4.39 MIL/uL (ref 3.87–5.11)
RDW: 17.7 % — ABNORMAL HIGH (ref 11.5–15.5)
WBC: 8 10*3/uL (ref 4.0–10.5)
nRBC: 0 % (ref 0.0–0.2)

## 2020-04-26 LAB — COMPREHENSIVE METABOLIC PANEL
ALT: 18 U/L (ref 0–44)
AST: 12 U/L — ABNORMAL LOW (ref 15–41)
Albumin: 2.9 g/dL — ABNORMAL LOW (ref 3.5–5.0)
Alkaline Phosphatase: 50 U/L (ref 38–126)
Anion gap: 5 (ref 5–15)
BUN: 36 mg/dL — ABNORMAL HIGH (ref 8–23)
CO2: 22 mmol/L (ref 22–32)
Calcium: 8.3 mg/dL — ABNORMAL LOW (ref 8.9–10.3)
Chloride: 112 mmol/L — ABNORMAL HIGH (ref 98–111)
Creatinine, Ser: 0.81 mg/dL (ref 0.44–1.00)
GFR calc Af Amer: >60 mL/min (ref >60)
GFR calc non Af Amer: >60 mL/min (ref >60)
Glucose, Bld: 137 mg/dL — ABNORMAL HIGH (ref 70–99)
Potassium: 4.1 mmol/L (ref 3.5–5.1)
Sodium: 139 mmol/L (ref 135–145)
Total Bilirubin: 0.7 mg/dL (ref 0.3–1.2)
Total Protein: 5.4 g/dL — ABNORMAL LOW (ref 6.5–8.1)

## 2020-04-26 LAB — PROCALCITONIN: Procalcitonin: 5.73 ng/mL

## 2020-04-26 LAB — MAGNESIUM: Magnesium: 2.1 mg/dL (ref 1.7–2.4)

## 2020-04-26 MED ORDER — TRAMADOL HCL 50 MG PO TABS
50.0000 mg | ORAL_TABLET | Freq: Four times a day (QID) | ORAL | Status: DC | PRN
  Administered 2020-04-27 – 2020-04-29 (×4): 50 mg via ORAL
  Filled 2020-04-26 (×4): qty 1

## 2020-04-26 MED ORDER — ATROPINE SULFATE 1 MG/10ML IJ SOSY
1.0000 mg | PREFILLED_SYRINGE | INTRAMUSCULAR | Status: DC | PRN
  Filled 2020-04-26: qty 10

## 2020-04-26 MED ORDER — HYDROCODONE-ACETAMINOPHEN 5-325 MG PO TABS
1.0000 | ORAL_TABLET | Freq: Four times a day (QID) | ORAL | Status: DC | PRN
  Administered 2020-04-26 – 2020-04-29 (×4): 2 via ORAL
  Filled 2020-04-26 (×4): qty 2

## 2020-04-26 NOTE — Plan of Care (Signed)
Patient has been weaned off of pressors this morning.  She feels markedly better.  Will discontinue A-line.  She is getting excellent care from hospitalist service and cardiology.  Remain available as needed.  Gailen Shelter, MD [facility]

## 2020-04-26 NOTE — Progress Notes (Signed)
Progress Note  Patient Name: Regina White Date of Encounter: 04/26/2020  Primary Cardiologist:   No primary care provider on file.   Subjective   Feels much better.  Pain with movement but no chest pain.  No SOB.   Inpatient Medications    Scheduled Meds: . atropine      . Chlorhexidine Gluconate Cloth  6 each Topical Q0600  . cholecalciferol  2,000 Units Oral Daily  . famotidine  20 mg Oral Daily  . heparin injection (subcutaneous)  5,000 Units Subcutaneous Q8H  . hydrocortisone sod succinate (SOLU-CORTEF) inj  50 mg Intravenous Q6H  . levothyroxine  200 mcg Oral Q0600  . melatonin  5 mg Oral QHS  . mometasone-formoterol  2 puff Inhalation BID  . montelukast  10 mg Oral QHS  . traZODone  150 mg Oral QHS   Continuous Infusions: . sodium chloride 10 mL/hr at 04/26/20 0400  . atropine    . cefTRIAXone (ROCEPHIN)  IV 2 g (04/26/20 0859)  . linezolid (ZYVOX) IV Stopped (04/25/20 2227)  . norepinephrine (LEVOPHED) Adult infusion 1 mcg/min (04/26/20 0400)  . phenylephrine (NEO-SYNEPHRINE) Adult infusion Stopped (04/25/20 2000)  . vasopressin (PITRESSIN) infusion - *FOR SHOCK* Stopped (04/25/20 1058)   PRN Meds: acetaminophen **OR** acetaminophen, albuterol, atropine, fentaNYL (SUBLIMAZE) injection, guaiFENesin-dextromethorphan, ipratropium, morphine injection, ondansetron **OR** ondansetron (ZOFRAN) IV, polyethylene glycol, simethicone   Vital Signs    Vitals:   04/26/20 0600 04/26/20 0615 04/26/20 0630 04/26/20 0645  BP:      Pulse: (!) 41 (!) 54 (!) 42 (!) 56  Resp: 15 15 16 15   Temp:      TempSrc:      SpO2: 100% 100% 100% 100%  Weight:      Height:        Intake/Output Summary (Last 24 hours) at 04/26/2020 0906 Last data filed at 04/26/2020 0600 Gross per 24 hour  Intake 2086.05 ml  Output 1215 ml  Net 871.05 ml   Filed Weights   04/24/20 1328 04/24/20 2100  Weight: 93.4 kg 105.5 kg    Telemetry    NSR with ectopy.  - Personally Reviewed  ECG      NA - Personally Reviewed  Physical Exam   GEN: No acute distress.   Neck: No  JVD Cardiac: RRR, no murmurs, rubs, or gallops.  Respiratory:    Decreased breath sounds GI: Soft, nontender, non-distended  MS: No  edema; No deformity. Neuro:  Nonfocal  Psych: Normal affect   Labs    Chemistry Recent Labs  Lab 04/24/20 2219 04/24/20 2219 04/25/20 0432 04/25/20 1533 04/26/20 0438  NA 140   < > 138 139 139  K 4.3   < > 4.2 4.3 4.1  CL 108   < > 112* 107 112*  CO2 25   < > 20* 18* 22  GLUCOSE 127*   < > 127* 138* 137*  BUN 40*   < > 40* 41* 36*  CREATININE 1.04*   < > 1.04* 0.90 0.81  CALCIUM 8.4*   < > 8.2* 8.2* 8.3*  PROT 5.8*  --  5.2*  --  5.4*  ALBUMIN 3.1*  --  2.8*  --  2.9*  AST 14*  --  10*  --  12*  ALT 12  --  11  --  18  ALKPHOS 51  --  46  --  50  BILITOT 0.9  --  0.9  --  0.7  GFRNONAA 56*   < >  56* >60 >60  GFRAA >60   < > >60 >60 >60  ANIONGAP 7   < > < > = values in this interval not displayed.     Hematology Recent Labs  Lab 04/24/20 2219 04/25/20 0432 04/26/20 0438  WBC 16.7* 17.9* 8.0  RBC 4.78 4.44 4.39  HGB 13.5 12.4 12.4  HCT 43.1 39.8 38.8  MCV 90.2 89.6 88.4  MCH 28.2 27.9 28.2  MCHC 31.3 31.2 32.0  RDW 18.2* 18.4* 17.7*  PLT 170 187 159    Cardiac EnzymesNo results for input(s): TROPONINI in the last 168 hours. No results for input(s): TROPIPOC in the last 168 hours.   BNP Recent Labs  Lab 04/25/20 0053  BNP 2,348.1*     DDimer No results for input(s): DDIMER in the last 168 hours.   Radiology    CT Angio Chest PE W and/or Wo Contrast  Result Date: 04/24/2020 CLINICAL DATA:  Sepsis, weakness, shortness of breath, hypoxia EXAM: CT ANGIOGRAPHY CHEST WITH CONTRAST TECHNIQUE: Multidetector CT imaging of the chest was performed using the standard protocol during bolus administration of intravenous contrast. Multiplanar CT image reconstructions and MIPs were obtained to evaluate the vascular anatomy. CONTRAST:   OMNIPAQUE IOHEXOL 350 MG/ML SOLN COMPARISON:  06/02/2018 FINDINGS: Cardiovascular: This is a technically adequate evaluation of the pulmonary vasculature. There are no filling defects or pulmonary emboli. Marked enlargement of the main pulmonary artery is unchanged, consistent with pulmonary arterial hypertension. Stable cardiomegaly, with prominent right ventricular dilatation. Normal caliber of the thoracic aorta. Mediastinum/Nodes: No enlarged mediastinal, hilar, or axillary lymph nodes. Thyroid gland, trachea, and esophagus demonstrate no significant findings. Lungs/Pleura: Diffuse background emphysema unchanged. Scattered hypoventilatory changes are seen within the dependent lower lobes. No airspace disease, effusion, or pneumothorax. Central airways are patent. Upper Abdomen: Reflux of contrast into the hepatic veins consistent with cardiac dysfunction. Please refer to dedicated CT abdomen and pelvis report. Musculoskeletal: There are no acute or destructive bony lesions. Spinal stimulator is seen within the thoracic central canal, lead at the T9 level. Reconstructed images demonstrate no additional findings. Review of the MIP images confirms the above findings. IMPRESSION: 1. No evidence of pulmonary embolus. 2. Stable dilatation of the main pulmonary artery consistent with pulmonary arterial hypertension. 3. Marked cardiomegaly and right ventricular dilatation, with reflux of contrast into the hepatic veins compatible with cardiac dysfunction. 4.  Emphysema (ICD10-J43.9). Electronically Signed   By: Sharlet Salina M.D.   On: 04/24/2020 17:40   CT ABDOMEN PELVIS W CONTRAST  Result Date: 04/24/2020 CLINICAL DATA:  Nausea and vomiting, weakness, decubitus ulcer EXAM: CT ABDOMEN AND PELVIS WITH CONTRAST TECHNIQUE: Multidetector CT imaging of the abdomen and pelvis was performed using the standard protocol following bolus administration of intravenous contrast. CONTRAST:  OMNIPAQUE IOHEXOL 350  MG/ML SOLN COMPARISON:  None. FINDINGS: Lower chest: Hypoventilatory changes are seen at the lung bases. The heart is enlarged. Please refer to dedicated chest CT report. Hepatobiliary: Dependent calcified gallstone without cholecystitis. The liver is unremarkable. Pancreas: Unremarkable. No pancreatic ductal dilatation or surrounding inflammatory changes. Spleen: Normal in size without focal abnormality. Adrenals/Urinary Tract: Evaluation of the kidneys limited due to streak artifact from orthopedic hardware. There are multiple areas of bilateral renal cortical scarring. No evidence of hydronephrosis. The adrenals are grossly unremarkable. Bladder is normal. Stomach/Bowel: No bowel obstruction or ileus. Moderate retained stool throughout the colon. Normal appendix right lower quadrant. No bowel wall thickening or inflammatory change. Vascular/Lymphatic: Venous stent  identified in the left external iliac and common iliac veins. No significant atherosclerosis. No pathologic adenopathy. Reproductive: Uterus is enlarged, with a dominant intramural fibroid measuring 7.2 x 6.7 by 6.0 cm. No adnexal masses. Other: No free fluid or free gas. No abdominal wall hernia. Musculoskeletal: Extensive postsurgical changes are seen from multilevel lumbar fusion. Spinal stimulator is seen entering the central canal at approximately L2, lead extending superiorly in the thoracic central canal. There are no acute bony abnormalities. Severe bilateral hip osteoarthritis is noted, left greater than right. Reconstructed images demonstrate no additional findings. IMPRESSION: 1. Cholelithiasis without cholecystitis. 2. Enlarged fibroid uterus. 3. Moderate fecal retention. Electronically Signed   By: Sharlet Salina M.D.   On: 04/24/2020 17:45   DG Chest Port 1 View  Result Date: 04/25/2020 CLINICAL DATA:  64 year old female central line placement. EXAM: PORTABLE CHEST 1 VIEW COMPARISON:  CTA chest and portable chest yesterday. FINDINGS:  Portable AP supine view at 0433 hours. Right IJ central line placed, tip at the level of the carina/SVC. Mildly improved lung volumes. Stable cardiomegaly and mediastinal contours. Visualized tracheal air column is within normal limits. No pneumothorax. Regressed atelectasis. No new pulmonary opacity. IMPRESSION: 1. Right IJ central line placed, tip at the SVC level. No pneumothorax. 2. Improved lung volumes and ventilation. No new cardiopulmonary abnormality. Electronically Signed   By: Odessa Fleming M.D.   On: 04/25/2020 04:50   DG Chest Port 1 View  Result Date: 04/24/2020 CLINICAL DATA:  Sepsis. EXAM: PORTABLE CHEST 1 VIEW COMPARISON:  October 19, 2019. FINDINGS: Stable cardiomegaly. No pneumothorax or pleural effusion is noted. Stable small interstitial densities are noted throughout both lungs which may represent scarring, although superimposed acute inflammation or edema cannot be excluded. Bony thorax is unremarkable. IMPRESSION: Stable small interstitial densities are noted throughout both lungs which may represent scarring, although superimposed acute inflammation or edema cannot be excluded. Electronically Signed   By: Lupita Raider M.D.   On: 04/24/2020 14:36   ECHOCARDIOGRAM COMPLETE  Result Date: 04/25/2020    ECHOCARDIOGRAM REPORT   Patient Name:   MARIABELLA NILSEN Perusse Date of Exam: 04/25/2020 Medical Rec #:  [id number]          Height:       65.0 in Accession #:    [id number]         Weight:       232.6 lb Date of Birth:  03-Sep-1956         BSA:          2.109 m Patient Age:    63 years           BP:           156/77 mmHg Patient Gender: F                  HR:           50 bpm. Exam Location:  [facility] Procedure: 2D Echo, Cardiac Doppler and Color Doppler Indications:     CHF- acute systolic 428.21  History:         Patient has prior history of Echocardiogram examinations, most                  recent 06/02/2018. CHF; COPD and Pulmonary HTN.  Sonographer:     Cristela Blue RDCS (AE) Referring Phys:   [id number] BRENDA MORRISON Diagnosing Phys: Julien Nordmann MD  Sonographer Comments: Suboptimal apical window. Image acquisition challenging due to COPD. IMPRESSIONS  1. Left  ventricular ejection fraction, by estimation, is 35 to 40%. The left ventricle has moderately decreased function. The left ventricle demonstrates global hypokinesis. The left ventricular internal cavity size was mildly dilated. Left ventricular diastolic parameters are consistent with Grade I diastolic dysfunction (impaired relaxation).  2. Right ventricular systolic function is moderately depressed. The right ventricular size is moderately dilated. There is severely elevated pulmonary artery systolic pressure.  3. Left atrial size was mildly dilated.  4. Tricuspid valve regurgitation is mild to moderate.  5. The inferior vena cava is dilated in size with >50% respiratory variability, suggesting right atrial pressure of 8 mmHg. FINDINGS  Left Ventricle: Left ventricular ejection fraction, by estimation, is 35 to 40%. The left ventricle has moderately decreased function. The left ventricle demonstrates global hypokinesis. The left ventricular internal cavity size was mildly dilated. There is no left ventricular hypertrophy. Left ventricular diastolic parameters are consistent with Grade I diastolic dysfunction (impaired relaxation). Right Ventricle: The right ventricular size is moderately enlarged. No increase in right ventricular wall thickness. Right ventricular systolic function is moderately reduced. There is severely elevated pulmonary artery systolic pressure. The tricuspid regurgitant velocity is 3.68 m/s, and with an assumed right atrial pressure of 10 mmHg, the estimated right ventricular systolic pressure is 14.4 mmHg. Left Atrium: Left atrial size was mildly dilated. Right Atrium: Right atrial size was normal in size. Pericardium: There is no evidence of pericardial effusion. Mitral Valve: The mitral valve is normal in structure. Normal  mobility of the mitral valve leaflets. No evidence of mitral valve regurgitation. No evidence of mitral valve stenosis. Tricuspid Valve: The tricuspid valve is normal in structure. Tricuspid valve regurgitation is mild to moderate. No evidence of tricuspid stenosis. Aortic Valve: The aortic valve was not well visualized. Aortic valve regurgitation is not visualized. No aortic stenosis is present. Aortic valve mean gradient measures 3.0 mmHg. Aortic valve peak gradient measures 5.8 mmHg. Aortic valve area, by VTI measures 1.83 cm. Pulmonic Valve: The pulmonic valve was normal in structure. Pulmonic valve regurgitation is not visualized. No evidence of pulmonic stenosis. Aorta: The aortic root is normal in size and structure. Venous: The inferior vena cava is dilated in size with greater than 50% respiratory variability, suggesting right atrial pressure of 8 mmHg. IAS/Shunts: No atrial level shunt detected by color flow Doppler.  LEFT VENTRICLE PLAX 2D LVIDd:         5.00 cm  Diastology LVIDs:         4.10 cm  LV e' lateral:   6.31 cm/s LV PW:         1.38 cm  LV E/e' lateral: 7.3 LV IVS:        0.84 cm LVOT diam:     2.10 cm LV SV:         41 LV SV Index:   20 LVOT Area:     3.46 cm  RIGHT VENTRICLE RV Basal diam:  5.42 cm RV S prime:     8.81 cm/s TAPSE (M-mode): 4.8 cm LEFT ATRIUM              Index       RIGHT ATRIUM           Index LA diam:        4.30 cm  2.04 cm/m  RA Area:     20.90 cm LA Vol (A2C):   117.0 ml 55.47 ml/m RA Volume:   60.90 ml  28.87 ml/m LA Vol (A4C):   58.4 ml  27.69 ml/m LA Biplane Vol: 85.5 ml  40.54 ml/m  AORTIC VALVE AV Area (Vmax):    1.32 cm AV Area (Vmean):   1.38 cm AV Area (VTI):     1.83 cm AV Vmax:           120.00 cm/s AV Vmean:          82.100 cm/s AV VTI:            0.225 m AV Peak Grad:      5.8 mmHg AV Mean Grad:      3.0 mmHg LVOT Vmax:         45.70 cm/s LVOT Vmean:        32.600 cm/s LVOT VTI:          0.119 m LVOT/AV VTI ratio: 0.53  AORTA Ao Root diam: 3.10 cm  MITRAL VALVE               TRICUSPID VALVE MV Area (PHT): 7.22 cm    TR Peak grad:   54.2 mmHg MV Decel Time: 105 msec    TR Vmax:        368.00 cm/s MV E velocity: 46.30 cm/s MV A velocity: 84.00 cm/s  SHUNTS MV E/A ratio:  0.55        Systemic VTI:  0.12 m                            Systemic Diam: 2.10 cm Julien Nordmann MD Electronically signed by Julien Nordmann MD Signature Date/Time: 04/25/2020/11:15:50 AM    Final     Cardiac Studies   ECHOCARDIOGRAM:    1. Left ventricular ejection fraction, by estimation, is 35 to 40%. The  left ventricle has moderately decreased function. The left ventricle  demonstrates global hypokinesis. The left ventricular internal cavity size  was mildly dilated. Left ventricular  diastolic parameters are consistent with Grade I diastolic dysfunction  (impaired relaxation).  2. Right ventricular systolic function is moderately depressed. The  Left Ventricle: Left ventricular ejection fraction, by estimation, is 35  to 40%. The left ventricle has moderately decreased function. The left  ventricle demonstrates global hypokinesis. The left ventricular internal  cavity size was mildly dilated.  There is no left ventricular hypertrophy. Left ventricular diastolic  parameters are consistent with Grade I diastolic dysfunction (impaired  relaxation).   Right Ventricle: The right ventricular size is moderately enlarged. No  increase in right ventricular wall thickness. Right ventricular systolic  function is moderately reduced. There is severely elevated pulmonary  artery systolic pressure. The tricuspid  regurgitant velocity is 3.68 m/s, and with an assumed right atrial  pressure of 10 mmHg, the estimated right ventricular systolic pressure is  64.2 mmHg.   Patient Profile     64 y.o. female with a hx of dilated cardiomyopathy, COPD , Covid + December 2020 on 4 L oxygen since that time , pulmonary hypertension presenting with severe sepsis secondary to  UTI/cellulitis, hypotension requiring vasopressors, Streptococcus agalactiaeBACTEREMIA, cardiomyopathy ejection fraction 35%  Assessment & Plan   Urosepsis/decubitus ulcer/bacteremia:  Per primary team.  Off pressors now.    Dilated cardiomyopathy:  She is followed at [facility].  EF is low.  However, not able to start pressors at this time.  If her BP is improved we can restart diuresis.  CVP is elevated at 21 but she has a history of pulmonary HTN. .    Pulmonary hypertension:  See above.  COPD:     Covid infection December 2020, has been on 4 L since that time   For questions or updates, please contact [facility] Please consult [email or web address] for contact info under Cardiology/STEMI.   Signed, Rollene Rotunda, MD  04/26/2020, 9:06 AM

## 2020-04-26 NOTE — Progress Notes (Signed)
[facility]  -  at [facility]   PATIENT NAME: Shannan Slinker    MR#:  [id number]  DATE OF BIRTH:  02/23/1956  SUBJECTIVE:  husband in the room. Patient appears to be in better spirits. She is off all visa pressers. Eating well. Pain better controlled. No fever.         REVIEW OF SYSTEMS:   Review of Systems  Constitutional: Positive for malaise/fatigue. Negative for chills, fever and weight loss.  HENT: Negative for ear discharge, ear pain and nosebleeds.   Eyes: Negative for blurred vision, pain and discharge.  Respiratory: Negative for sputum production, shortness of breath, wheezing and stridor.   Cardiovascular: Negative for chest pain, palpitations, orthopnea and PND.  Gastrointestinal: Negative for abdominal pain, diarrhea, nausea and vomiting.  Genitourinary: Negative for frequency and urgency.  Musculoskeletal: Positive for joint pain. Negative for back pain.  Neurological: Positive for weakness. Negative for sensory change, speech change and focal weakness.  Psychiatric/Behavioral: Negative for depression and hallucinations. The patient is not nervous/anxious.    Tolerating Diet:yes Tolerating PT: bed boiund  DRUG ALLERGIES:   Allergies  Allergen Reactions  . Penicillins Anaphylaxis    Breathing problems Did it involve swelling of the face/tongue/throat, SOB, or low BP? Yes Did it involve sudden or severe rash/hives, skin peeling, or any reaction on the inside of your mouth or nose? Unknown Did you need to seek medical attention at a hospital or doctor's office? Unknown When did it last happen?unknown If all above answers are "NO", may proceed with cephalosporin use. Patient tolerated Ceftriaxone September 2020  . Erythromycin Nausea And Vomiting and Rash    STOMACH PAIN  . Levofloxacin Other (See Comments)    Muscle aches  . Lidocaine Rash  . Clindamycin Other (See Comments)    Recurrent C diff  . Levothyroxine Other (See  Comments)    Vaginal bleeding Reaction to generic (pt currently takes name brand Synthroid)  . Tiotropium Other (See Comments)    Coughing up blood  . Doxycycline Nausea And Vomiting  . Sulfa Antibiotics Rash    VITALS:  Blood pressure 132/76, pulse 62, temperature (!) 97.3 F (36.3 C), temperature source Oral, resp. rate (!) 23, height 5\' 5"  (1.651 m), weight 105.5 kg, SpO2 99 %.  PHYSICAL EXAMINATION:   Physical Exam  GENERAL:  64 y.o.-year-old patient lying in the bed with no acute distress.morbid obesity  EYES: Pupils equal, round, reactive to light and accommodation. No scleral icterus.   HEENT: Head atraumatic, normocephalic. Oropharynx and nasopharynx clear.  NECK:  Supple, no jugular venous distention. No thyroid enlargement, no tenderness.  LUNGS: Normal breath sounds bilaterally, no wheezing, rales, rhonchi. No use of accessory muscles of respiration.  CARDIOVASCULAR: S1, S2 normal. No murmurs, rubs, or gallops.  ABDOMEN: Soft, nontender, nondistended. Bowel sounds present. No organomegaly or mass.  Right femoral line+ (June 18th) Foley+ (6/18) EXTREMITIES: No cyanosis, clubbing  + edema b/l.    NEUROLOGIC: non faocl PSYCHIATRIC:  patient is alert and oriented x 3.  SKIN:    LABORATORY PANEL:  CBC Recent Labs  Lab 04/26/20 0438  WBC 8.0  HGB 12.4  HCT 38.8  PLT 159    Chemistries  Recent Labs  Lab 04/26/20 0438  NA 139  K 4.1  CL 112*  CO2 22  GLUCOSE 137*  BUN 36*  CREATININE 0.81  CALCIUM 8.3*  MG 2.1  AST 12*  ALT 18  ALKPHOS 50  BILITOT 0.7   Cardiac Enzymes  No results for input(s): TROPONINI in the last 168 hours. RADIOLOGY:  CT Angio Chest PE W and/or Wo Contrast  Result Date: 04/24/2020 CLINICAL DATA:  Sepsis, weakness, shortness of breath, hypoxia EXAM: CT ANGIOGRAPHY CHEST WITH CONTRAST TECHNIQUE: Multidetector CT imaging of the chest was performed using the standard protocol during bolus administration of intravenous contrast.  Multiplanar CT image reconstructions and MIPs were obtained to evaluate the vascular anatomy. CONTRAST:  OMNIPAQUE IOHEXOL 350 MG/ML SOLN COMPARISON:  06/02/2018 FINDINGS: Cardiovascular: This is a technically adequate evaluation of the pulmonary vasculature. There are no filling defects or pulmonary emboli. Marked enlargement of the main pulmonary artery is unchanged, consistent with pulmonary arterial hypertension. Stable cardiomegaly, with prominent right ventricular dilatation. Normal caliber of the thoracic aorta. Mediastinum/Nodes: No enlarged mediastinal, hilar, or axillary lymph nodes. Thyroid gland, trachea, and esophagus demonstrate no significant findings. Lungs/Pleura: Diffuse background emphysema unchanged. Scattered hypoventilatory changes are seen within the dependent lower lobes. No airspace disease, effusion, or pneumothorax. Central airways are patent. Upper Abdomen: Reflux of contrast into the hepatic veins consistent with cardiac dysfunction. Please refer to dedicated CT abdomen and pelvis report. Musculoskeletal: There are no acute or destructive bony lesions. Spinal stimulator is seen within the thoracic central canal, lead at the T9 level. Reconstructed images demonstrate no additional findings. Review of the MIP images confirms the above findings. IMPRESSION: 1. No evidence of pulmonary embolus. 2. Stable dilatation of the main pulmonary artery consistent with pulmonary arterial hypertension. 3. Marked cardiomegaly and right ventricular dilatation, with reflux of contrast into the hepatic veins compatible with cardiac dysfunction. 4.  Emphysema (ICD10-J43.9). Electronically Signed   By: Sharlet Salina M.D.   On: 04/24/2020 17:40   CT ABDOMEN PELVIS W CONTRAST  Result Date: 04/24/2020 CLINICAL DATA:  Nausea and vomiting, weakness, decubitus ulcer EXAM: CT ABDOMEN AND PELVIS WITH CONTRAST TECHNIQUE: Multidetector CT imaging of the abdomen and pelvis was performed using the standard  protocol following bolus administration of intravenous contrast. CONTRAST:  OMNIPAQUE IOHEXOL 350 MG/ML SOLN COMPARISON:  None. FINDINGS: Lower chest: Hypoventilatory changes are seen at the lung bases. The heart is enlarged. Please refer to dedicated chest CT report. Hepatobiliary: Dependent calcified gallstone without cholecystitis. The liver is unremarkable. Pancreas: Unremarkable. No pancreatic ductal dilatation or surrounding inflammatory changes. Spleen: Normal in size without focal abnormality. Adrenals/Urinary Tract: Evaluation of the kidneys limited due to streak artifact from orthopedic hardware. There are multiple areas of bilateral renal cortical scarring. No evidence of hydronephrosis. The adrenals are grossly unremarkable. Bladder is normal. Stomach/Bowel: No bowel obstruction or ileus. Moderate retained stool throughout the colon. Normal appendix right lower quadrant. No bowel wall thickening or inflammatory change. Vascular/Lymphatic: Venous stent identified in the left external iliac and common iliac veins. No significant atherosclerosis. No pathologic adenopathy. Reproductive: Uterus is enlarged, with a dominant intramural fibroid measuring 7.2 x 6.7 by 6.0 cm. No adnexal masses. Other: No free fluid or free gas. No abdominal wall hernia. Musculoskeletal: Extensive postsurgical changes are seen from multilevel lumbar fusion. Spinal stimulator is seen entering the central canal at approximately L2, lead extending superiorly in the thoracic central canal. There are no acute bony abnormalities. Severe bilateral hip osteoarthritis is noted, left greater than right. Reconstructed images demonstrate no additional findings. IMPRESSION: 1. Cholelithiasis without cholecystitis. 2. Enlarged fibroid uterus. 3. Moderate fecal retention. Electronically Signed   By: Sharlet Salina M.D.   On: 04/24/2020 17:45   DG Chest Port 1 View  Result Date: 04/25/2020 CLINICAL DATA:  64 year old  female central line  placement. EXAM: PORTABLE CHEST 1 VIEW COMPARISON:  CTA chest and portable chest yesterday. FINDINGS: Portable AP supine view at 0433 hours. Right IJ central line placed, tip at the level of the carina/SVC. Mildly improved lung volumes. Stable cardiomegaly and mediastinal contours. Visualized tracheal air column is within normal limits. No pneumothorax. Regressed atelectasis. No new pulmonary opacity. IMPRESSION: 1. Right IJ central line placed, tip at the SVC level. No pneumothorax. 2. Improved lung volumes and ventilation. No new cardiopulmonary abnormality. Electronically Signed   By: Genevie Ann M.D.   On: 04/25/2020 04:50   ECHOCARDIOGRAM COMPLETE  Result Date: 04/25/2020    ECHOCARDIOGRAM REPORT   Patient Name:   LELAND STASZEWSKI Eberle Date of Exam: 04/25/2020 Medical Rec #:  [id number]          Height:       65.0 in Accession #:    [id number]         Weight:       232.6 lb Date of Birth:  11-12-1955         BSA:          2.109 m Patient Age:    65 years           BP:           156/77 mmHg Patient Gender: F                  HR:           50 bpm. Exam Location:  [facility] Procedure: 2D Echo, Cardiac Doppler and Color Doppler Indications:     CHF- acute systolic 284.13  History:         Patient has prior history of Echocardiogram examinations, most                  recent 06/02/2018. CHF; COPD and Pulmonary HTN.  Sonographer:     Sherrie Sport RDCS (AE) Referring Phys:  [id number] BRENDA MORRISON Diagnosing Phys: Ida Rogue MD  Sonographer Comments: Suboptimal apical window. Image acquisition challenging due to COPD. IMPRESSIONS  1. Left ventricular ejection fraction, by estimation, is 35 to 40%. The left ventricle has moderately decreased function. The left ventricle demonstrates global hypokinesis. The left ventricular internal cavity size was mildly dilated. Left ventricular diastolic parameters are consistent with Grade I diastolic dysfunction (impaired relaxation).  2. Right ventricular systolic function is moderately  depressed. The right ventricular size is moderately dilated. There is severely elevated pulmonary artery systolic pressure.  3. Left atrial size was mildly dilated.  4. Tricuspid valve regurgitation is mild to moderate.  5. The inferior vena cava is dilated in size with >50% respiratory variability, suggesting right atrial pressure of 8 mmHg. FINDINGS  Left Ventricle: Left ventricular ejection fraction, by estimation, is 35 to 40%. The left ventricle has moderately decreased function. The left ventricle demonstrates global hypokinesis. The left ventricular internal cavity size was mildly dilated. There is no left ventricular hypertrophy. Left ventricular diastolic parameters are consistent with Grade I diastolic dysfunction (impaired relaxation). Right Ventricle: The right ventricular size is moderately enlarged. No increase in right ventricular wall thickness. Right ventricular systolic function is moderately reduced. There is severely elevated pulmonary artery systolic pressure. The tricuspid regurgitant velocity is 3.68 m/s, and with an assumed right atrial pressure of 10 mmHg, the estimated right ventricular systolic pressure is 72.5 mmHg. Left Atrium: Left atrial size was mildly dilated. Right Atrium: Right atrial size was normal in size. Pericardium: There is  no evidence of pericardial effusion. Mitral Valve: The mitral valve is normal in structure. Normal mobility of the mitral valve leaflets. No evidence of mitral valve regurgitation. No evidence of mitral valve stenosis. Tricuspid Valve: The tricuspid valve is normal in structure. Tricuspid valve regurgitation is mild to moderate. No evidence of tricuspid stenosis. Aortic Valve: The aortic valve was not well visualized. Aortic valve regurgitation is not visualized. No aortic stenosis is present. Aortic valve mean gradient measures 3.0 mmHg. Aortic valve peak gradient measures 5.8 mmHg. Aortic valve area, by VTI measures 1.83 cm. Pulmonic Valve: The  pulmonic valve was normal in structure. Pulmonic valve regurgitation is not visualized. No evidence of pulmonic stenosis. Aorta: The aortic root is normal in size and structure. Venous: The inferior vena cava is dilated in size with greater than 50% respiratory variability, suggesting right atrial pressure of 8 mmHg. IAS/Shunts: No atrial level shunt detected by color flow Doppler.  LEFT VENTRICLE PLAX 2D LVIDd:         5.00 cm  Diastology LVIDs:         4.10 cm  LV e' lateral:   6.31 cm/s LV PW:         1.38 cm  LV E/e' lateral: 7.3 LV IVS:        0.84 cm LVOT diam:     2.10 cm LV SV:         41 LV SV Index:   20 LVOT Area:     3.46 cm  RIGHT VENTRICLE RV Basal diam:  5.42 cm RV S prime:     8.81 cm/s TAPSE (M-mode): 4.8 cm LEFT ATRIUM              Index       RIGHT ATRIUM           Index LA diam:        4.30 cm  2.04 cm/m  RA Area:     20.90 cm LA Vol (A2C):   117.0 ml 55.47 ml/m RA Volume:   60.90 ml  28.87 ml/m LA Vol (A4C):   58.4 ml  27.69 ml/m LA Biplane Vol: 85.5 ml  40.54 ml/m  AORTIC VALVE AV Area (Vmax):    1.32 cm AV Area (Vmean):   1.38 cm AV Area (VTI):     1.83 cm AV Vmax:           120.00 cm/s AV Vmean:          82.100 cm/s AV VTI:            0.225 m AV Peak Grad:      5.8 mmHg AV Mean Grad:      3.0 mmHg LVOT Vmax:         45.70 cm/s LVOT Vmean:        32.600 cm/s LVOT VTI:          0.119 m LVOT/AV VTI ratio: 0.53  AORTA Ao Root diam: 3.10 cm MITRAL VALVE               TRICUSPID VALVE MV Area (PHT): 7.22 cm    TR Peak grad:   54.2 mmHg MV Decel Time: 105 msec    TR Vmax:        368.00 cm/s MV E velocity: 46.30 cm/s MV A velocity: 84.00 cm/s  SHUNTS MV E/A ratio:  0.55        Systemic VTI:  0.12 m  Systemic Diam: 2.10 cm Julien Nordmannimothy Gollan MD Electronically signed by Julien Nordmannimothy Gollan MD Signature Date/Time: 04/25/2020/11:15:50 AM    Final    ASSESSMENT AND PLAN:   Louretta ShortenCathleen N Seder is a 64 y.o. female with medical history significant of COPD on 3 L oxygen at home,  diastolic CHF, pulmonary hypertension, asthma, chronic pain is syndrome, pulmonary hypertension, who came to the ER with significant pain weakness and was found to be septic.  She has a wound on the left side of her buttocks.  She was also short of breath.  *Septic shock with acute on chronic hypoxic respiratory failure in acute renal failure POA--improved now -source urine and decubitus -continue IV phenylephrine and vasopressin--weaned off -stress those IV steroids--d/c it -IV fluids--d/c them -appreciate ICU attending input -blood culture positive for Streptococcus agalactaie  -IV Rocephin and linezolid-- de-escalate antibiotics when appropriate  *Acute renal failure secondary to hypotension in the setting of septic shock -continue IV fluids IV pressors--now weaned off -Avoid nephrotoxic agents -monitor input output - creatinine normal -Good UOP -d/c foley in am  *Infected small decubitus -seen by surgery no surgical intervention needed. 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 - Patient is bedbound for more than a year -she has morphine pump for many years due to her chronic back pain -complains of excruciating pain and unable to move without causing a lot of pain -palliative care consultation for goals of care -PRN pain meds as well as allow -add prn ultran and norco for pain -d/c IV fentanyl  *Incidental finding of cholelithiasis of CT scan -labs stable continue to monitor  *Dilated cardiomyopathy. [facility] -Echo shows EF of 35%. -As outpatient patient is on bisoprolol and entersto-- holding currently due to low blood pressure -mild elevated BNP.  -pt sats stable -good uop -prn lasix  *Acute hypoxic respiratory failure secondary to COPD exacerbation and mild CHF systolic acute -continue BiPAP as needed -PRN Lasix -inhalers/nebulizer -patient  uses chronic oxygen 4 L at home--- post COVID infection in December 2020  *Hypothyroidism continue Synthroid  Transfer step down  Procedures:arterial line--removed Family communication : husband in the room Consults :cardiology. ICU CODE STATUS: FULL DVT Prophylaxis :heparin  Status is: Inpatient  Remains inpatient appropriate because:IV treatments appropriate due to intensity of illness or inability to take PO   Dispo: The patient is from: Home              Anticipated d/c is to: Home with HH (per pt)               Anticipated d/c date is: > 3 days              Patient currently is not medically stable to d/c.    TOTAL critical came in with generalized weakness. TIME TAKING CARE OF THIS PATIENT: 35 minutes.  >50% time spent on counselling and coordination of care  Note: This dictation was prepared with Dragon dictation along with smaller phrase technology. Any transcriptional errors that result from this process are unintentional.  Enedina FinnerSona Woodrow Drab M.D    [facility]   CC: Primary care physician; Cain SieveKlipstein, Christopher, MDPatient ID: Louretta Shortenathleen N Corey, female   DOB: 04/29/1956, 64 y.o.   MRN: [id number]

## 2020-04-26 NOTE — Progress Notes (Signed)
Arterial line removed as per order.  Patient tolerated the removal well and pressure dressing applied.

## 2020-04-27 LAB — CBC WITH DIFFERENTIAL/PLATELET
Abs Immature Granulocytes: 0.02 10*3/uL (ref 0.00–0.07)
Basophils Absolute: 0 10*3/uL (ref 0.0–0.1)
Basophils Relative: 1 %
Eosinophils Absolute: 0.1 10*3/uL (ref 0.0–0.5)
Eosinophils Relative: 1 %
HCT: 36.3 % (ref 36.0–46.0)
Hemoglobin: 11.8 g/dL — ABNORMAL LOW (ref 12.0–15.0)
Immature Granulocytes: 0 %
Lymphocytes Relative: 20 %
Lymphs Abs: 1.5 10*3/uL (ref 0.7–4.0)
MCH: 28 pg (ref 26.0–34.0)
MCHC: 32.5 g/dL (ref 30.0–36.0)
MCV: 86 fL (ref 80.0–100.0)
Monocytes Absolute: 0.4 10*3/uL (ref 0.1–1.0)
Monocytes Relative: 5 %
Neutro Abs: 5.5 10*3/uL (ref 1.7–7.7)
Neutrophils Relative %: 73 %
Platelets: 154 10*3/uL (ref 150–400)
RBC: 4.22 MIL/uL (ref 3.87–5.11)
RDW: 17.9 % — ABNORMAL HIGH (ref 11.5–15.5)
WBC: 7.6 10*3/uL (ref 4.0–10.5)
nRBC: 0 % (ref 0.0–0.2)

## 2020-04-27 LAB — COMPREHENSIVE METABOLIC PANEL
ALT: 13 U/L (ref 0–44)
AST: 10 U/L — ABNORMAL LOW (ref 15–41)
Albumin: 2.7 g/dL — ABNORMAL LOW (ref 3.5–5.0)
Alkaline Phosphatase: 43 U/L (ref 38–126)
Anion gap: 7 (ref 5–15)
BUN: 40 mg/dL — ABNORMAL HIGH (ref 8–23)
CO2: 22 mmol/L (ref 22–32)
Calcium: 8.6 mg/dL — ABNORMAL LOW (ref 8.9–10.3)
Chloride: 110 mmol/L (ref 98–111)
Creatinine, Ser: 0.85 mg/dL (ref 0.44–1.00)
GFR calc Af Amer: >60 mL/min (ref >60)
GFR calc non Af Amer: >60 mL/min (ref >60)
Glucose, Bld: 88 mg/dL (ref 70–99)
Potassium: 3.6 mmol/L (ref 3.5–5.1)
Sodium: 139 mmol/L (ref 135–145)
Total Bilirubin: 0.5 mg/dL (ref 0.3–1.2)
Total Protein: 4.8 g/dL — ABNORMAL LOW (ref 6.5–8.1)

## 2020-04-27 LAB — CULTURE, BLOOD (ROUTINE X 2): Special Requests: ADEQUATE

## 2020-04-27 LAB — PROCALCITONIN: Procalcitonin: 4.27 ng/mL

## 2020-04-27 LAB — PHOSPHORUS: Phosphorus: 3.2 mg/dL (ref 2.5–4.6)

## 2020-04-27 LAB — MAGNESIUM: Magnesium: 1.9 mg/dL (ref 1.7–2.4)

## 2020-04-27 MED ORDER — BISOPROLOL FUMARATE 5 MG PO TABS
2.5000 mg | ORAL_TABLET | Freq: Every day | ORAL | Status: DC
  Administered 2020-04-27: 2.5 mg via ORAL
  Filled 2020-04-27 (×2): qty 0.5

## 2020-04-27 NOTE — Progress Notes (Signed)
Report called to RN on 1C.  Patient going to 1C on ICU bed.  Patient has refused to be repositioned or have her skin assessed by this RN this shift due to "not wanting to be moved in the bed". Patient educated on the importance of repositioning.

## 2020-04-27 NOTE — Progress Notes (Signed)
[facility]  - [place] at [facility]   PATIENT NAME: Regina White    MR#:  [id number]  DATE OF BIRTH:  19-Oct-1956  SUBJECTIVE:  husband in the room. Patient appears to be in better spirits. . Eating well. Pain better controlled. No fever. sats good         REVIEW OF SYSTEMS:   Review of Systems  Constitutional: Positive for malaise/fatigue. Negative for chills, fever and weight loss.  HENT: Negative for ear discharge, ear pain and nosebleeds.   Eyes: Negative for blurred vision, pain and discharge.  Respiratory: Negative for sputum production, shortness of breath, wheezing and stridor.   Cardiovascular: Negative for chest pain, palpitations, orthopnea and PND.  Gastrointestinal: Negative for abdominal pain, diarrhea, nausea and vomiting.  Genitourinary: Negative for frequency and urgency.  Musculoskeletal: Positive for joint pain. Negative for back pain.  Neurological: Positive for weakness. Negative for sensory change, speech change and focal weakness.  Psychiatric/Behavioral: Negative for depression and hallucinations. The patient is not nervous/anxious.    Tolerating Diet:yes Tolerating PT: bed bound  DRUG ALLERGIES:   Allergies  Allergen Reactions  . Penicillins Anaphylaxis    Breathing problems Did it involve swelling of the face/tongue/throat, SOB, or low BP? Yes Did it involve sudden or severe rash/hives, skin peeling, or any reaction on the inside of your mouth or nose? Unknown Did you need to seek medical attention at a hospital or doctor's office? Unknown When did it last happen?unknown If all above answers are "NO", may proceed with cephalosporin use. Patient tolerated Ceftriaxone September 2020  . Erythromycin Nausea And Vomiting and Rash    STOMACH PAIN  . Levofloxacin Other (See Comments)    Muscle aches  . Lidocaine Rash  . Clindamycin Other (See Comments)    Recurrent C diff  . Levothyroxine Other (See Comments)    Vaginal  bleeding Reaction to generic (pt currently takes name brand Synthroid)  . Tiotropium Other (See Comments)    Coughing up blood  . Doxycycline Nausea And Vomiting  . Sulfa Antibiotics Rash    VITALS:  Blood pressure (!) 143/89, pulse (!) 128, temperature 98 F (36.7 C), temperature source Oral, resp. rate (!) 21, height 5\' 5"  (1.651 m), weight 105.5 kg, SpO2 (!) 87 %.  PHYSICAL EXAMINATION:   Physical Exam  GENERAL:  64 y.o.-year-old patient lying in the bed with no acute distress.morbid obesity  EYES: Pupils equal, round, reactive to light and accommodation. No scleral icterus.   HEENT: Head atraumatic, normocephalic. Oropharynx and nasopharynx clear.  NECK:  Supple, no jugular venous distention. No thyroid enlargement, no tenderness.  LUNGS: Normal breath sounds bilaterally, no wheezing, rales, rhonchi. No use of accessory muscles of respiration.  CARDIOVASCULAR: S1, S2 normal. No murmurs, rubs, or gallops.  ABDOMEN: Soft, nontender, nondistended. Bowel sounds present. No organomegaly or mass.  Right femoral line+ (June 18th) Foley+ (6/18) EXTREMITIES: No cyanosis, clubbing  + edema b/l.    NEUROLOGIC: non faocl PSYCHIATRIC:  patient is alert and oriented x 3.  SKIN:    LABORATORY PANEL:  CBC Recent Labs  Lab 04/27/20 0440  WBC 7.6  HGB 11.8*  HCT 36.3  PLT 154    Chemistries  Recent Labs  Lab 04/27/20 0440  NA 139  K 3.6  CL 110  CO2 22  GLUCOSE 88  BUN 40*  CREATININE 0.85  CALCIUM 8.6*  MG 1.9  AST 10*  ALT 13  ALKPHOS 43  BILITOT 0.5   Cardiac Enzymes No  results for input(s): TROPONINI in the last 168 hours. RADIOLOGY:  No results found. ASSESSMENT AND PLAN:   Leslee Suire Helvie is a 64 y.o. female with medical history significant of COPD on 3 L oxygen at home, diastolic CHF, pulmonary hypertension, asthma, chronic pain is syndrome, pulmonary hypertension, who came to the ER with significant pain weakness and was found to be septic.  She has a  wound on the left side of her buttocks.  She was also short of breath.  *Septic shock with acute on chronic hypoxic respiratory failure in acute renal failure POA--improved now -source urine and decubitus -continue IV phenylephrine and vasopressin--weaned off -stress dose IV steroids--d/c it -IV fluids--d/c  -appreciate ICU attending input -blood culture positive for Streptococcus agalactaie  -IV Rocephin for now -d/c linezolid  *Acute renal failure secondary to hypotension in the setting of septic shock -continue IV fluids IV pressors--now weaned off -Avoid nephrotoxic agents -monitor input output - creatinine normal -Good UOP -d/c foley in am (Monday)--pt wants to leave it longer  *Infected small decubitus with erythematous skin over hte bittocks -seen by surgery no surgical intervention needed. 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 - Patient is bedbound for more than a year -she has morphine pump for many years due to her chronic back pain -complains of excruciating pain and unable to move without causing a lot of pain -palliative care consultation for goals of care -PRN pain meds as well as allow -add prn ultran and norco for pain -d/c IV fentanyl  *Incidental finding of cholelithiasis of CT scan -labs stable continue to monitor  *Dilated cardiomyopathy. Patient follows at [facility] -Echo shows EF of 35%. -As outpatient patient is on bisoprolol and entersto--resumed bisoprolol 2.5 mg  -mild elevated BNP.  -pt sats stable -good uop -prn lasix  *Acute hypoxic respiratory failure secondary to COPD exacerbation and mild CHF systolic acute -continue BiPAP as needed -PRN Lasix -inhalers/nebulizer -patient uses chronic oxygen 4 L at home--- post COVID infection in December 2020 -resumed biosprolol today low dose  *Hypothyroidism continue Synthroid  Transfer to  floor  TOC for d/c planning  Procedures:arterial line--removed Family communication : husband in the room Consults :cardiology. ICU CODE STATUS: FULL DVT Prophylaxis :heparin  Status is: Inpatient  Remains inpatient appropriate because:IV treatments appropriate due to intensity of illness or inability to take PO   Dispo: The patient is from: Home              Anticipated d/c is to: Home with [facility] (per pt) likely Tuesday              Anticipated d/c date is: > 3 days              Patient currently is not medically stable to d/c.    TOTAL TIME TAKING CARE OF THIS PATIENT: 25 minutes.  >50% time spent on counselling and coordination of care  Note: This dictation was prepared with Dragon dictation along with smaller phrase technology. Any transcriptional errors that result from this process are unintentional.  Fritzi Mandes M.D    [facility]   CC: Primary care physician; Jacklynn Barnacle, MDPatient ID: Dorette Grate, female   DOB: 1956-10-14, 64 y.o.   MRN: [id number]

## 2020-04-27 NOTE — Progress Notes (Signed)
Progress Note  Patient Name: Regina White Date of Encounter: 04/27/2020  Primary Cardiologist:   No primary care provider on file.   Subjective   Denies SOB.   Complains of chronic back and buttocks pain.   Inpatient Medications    Scheduled Meds: . Chlorhexidine Gluconate Cloth  6 each Topical Q0600  . cholecalciferol  2,000 Units Oral Daily  . famotidine  20 mg Oral Daily  . heparin injection (subcutaneous)  5,000 Units Subcutaneous Q8H  . levothyroxine  200 mcg Oral Q0600  . melatonin  5 mg Oral QHS  . mometasone-formoterol  2 puff Inhalation BID  . montelukast  10 mg Oral QHS  . traZODone  150 mg Oral QHS   Continuous Infusions: . sodium chloride Stopped (04/26/20 1014)  . atropine    . cefTRIAXone (ROCEPHIN)  IV 180 mL/hr at 04/27/20 1023   PRN Meds: acetaminophen **OR** acetaminophen, albuterol, atropine, guaiFENesin-dextromethorphan, HYDROcodone-acetaminophen, ipratropium, morphine injection, ondansetron **OR** ondansetron (ZOFRAN) IV, polyethylene glycol, simethicone, traMADol   Vital Signs    Vitals:   04/27/20 0700 04/27/20 0800 04/27/20 0900 04/27/20 1000  BP: 133/64 (!) 145/75 139/67 (!) 143/89  Pulse: (!) 49 71 65 (!) 128  Resp: 17 (!) 22 20 (!) 21  Temp:  98 F (36.7 C)    TempSrc:  Oral    SpO2: 96% 91% 90% (!) 87%  Weight:      Height:        Intake/Output Summary (Last 24 hours) at 04/27/2020 1134 Last data filed at 04/27/2020 1023 Gross per 24 hour  Intake 877.31 ml  Output 725 ml  Net 152.31 ml   Filed Weights   04/24/20 1328 04/24/20 2100  Weight: 93.4 kg 105.5 kg    Telemetry    NSR, rare ectopy  - Personally Reviewed  ECG    NA - Personally Reviewed  Physical Exam   GEN: No  acute distress.   Neck: No  JVD Cardiac: RRR, no murmurs, rubs, or gallops.  Respiratory: Clear   to auscultation bilaterally. GI: Soft, nontender, non-distended, normal bowel sounds  MS:  Mild left greater than right edema; No  deformity. Neuro:   Nonfocal  Psych: Oriented and appropriate    Labs    Chemistry Recent Labs  Lab 04/25/20 0432 04/25/20 0432 04/25/20 1533 04/26/20 0438 04/27/20 0440  NA 138   < > 139 139 139  K 4.2   < > 4.3 4.1 3.6  CL 112*   < > 107 112* 110  CO2 20*   < > 18* 22 22  GLUCOSE 127*   < > 138* 137* 88  BUN 40*   < > 41* 36* 40*  CREATININE 1.04*   < > 0.90 0.81 0.85  CALCIUM 8.2*   < > 8.2* 8.3* 8.6*  PROT 5.2*  --   --  5.4* 4.8*  ALBUMIN 2.8*  --   --  2.9* 2.7*  AST 10*  --   --  12* 10*  ALT 11  --   --  18 13  ALKPHOS 46  --   --  50 43  BILITOT 0.9  --   --  0.7 0.5  GFRNONAA 56*   < > >60 >60 >60  GFRAA >60   < > >60 >60 >60  ANIONGAP 6   < > 14 5 7    < > = values in this interval not displayed.     Hematology Recent Labs  Lab 04/25/20 [phone]  04/26/20 0438 04/27/20 0440  WBC 17.9* 8.0 7.6  RBC 4.44 4.39 4.22  HGB 12.4 12.4 11.8*  HCT 39.8 38.8 36.3  MCV 89.6 88.4 86.0  MCH 27.9 28.2 28.0  MCHC 31.2 32.0 32.5  RDW 18.4* 17.7* 17.9*  PLT 187 159 154    Cardiac EnzymesNo results for input(s): TROPONINI in the last 168 hours. No results for input(s): TROPIPOC in the last 168 hours.   BNP Recent Labs  Lab 04/25/20 0053  BNP 2,348.1*     DDimer No results for input(s): DDIMER in the last 168 hours.   Radiology    No results found.  Cardiac Studies   ECHOCARDIOGRAM:    1. Left ventricular ejection fraction, by estimation, is 35 to 40%. The  left ventricle has moderately decreased function. The left ventricle  demonstrates global hypokinesis. The left ventricular internal cavity size  was mildly dilated. Left ventricular  diastolic parameters are consistent with Grade I diastolic dysfunction  (impaired relaxation).  2. Right ventricular systolic function is moderately depressed. The  Left Ventricle: Left ventricular ejection fraction, by estimation, is 35  to 40%. The left ventricle has moderately decreased function. The left   ventricle demonstrates global hypokinesis. The left ventricular internal  cavity size was mildly dilated.  There is no left ventricular hypertrophy. Left ventricular diastolic  parameters are consistent with Grade I diastolic dysfunction (impaired  relaxation).   Right Ventricle: The right ventricular size is moderately enlarged. No  increase in right ventricular wall thickness. Right ventricular systolic  function is moderately reduced. There is severely elevated pulmonary  artery systolic pressure. The tricuspid  regurgitant velocity is 3.68 m/s, and with an assumed right atrial  pressure of 10 mmHg, the estimated right ventricular systolic pressure is  64.2 mmHg.   Patient Profile     64 y.o. female with a hx of dilated cardiomyopathy, COPD , Covid + December 2020 on 4 L oxygen since that time , pulmonary hypertension presenting with severe sepsis secondary to UTI/cellulitis, hypotension requiring vasopressors, Streptococcus agalactiaeBACTEREMIA, cardiomyopathy ejection fraction 35%  Assessment & Plan   Urosepsis/decubitus ulcer/bacteremia:  Per primary team.   Dilated cardiomyopathy:  She is followed at [facility].  EF is low.   BP improved.  OK to slowly restart meds.  Will start Bisoprolol 2.5 mg.     Pulmonary hypertension:  Chronic.  CVP actually measuring much lower.  Holding off on diuresis with recent renal insufficiency.      COPD:     Covid infection December 2020, has been on 4 L since that time   For questions or updates, please contact [facility] Please consult [email or web address] for contact info under Cardiology/STEMI.   Signed, Rollene Rotunda, MD  04/27/2020, 11:34 AM

## 2020-04-28 DIAGNOSIS — L03317 Cellulitis of buttock: Secondary | ICD-10-CM

## 2020-04-28 DIAGNOSIS — A409 Streptococcal sepsis, unspecified: Secondary | ICD-10-CM

## 2020-04-28 DIAGNOSIS — B951 Streptococcus, group B, as the cause of diseases classified elsewhere: Secondary | ICD-10-CM

## 2020-04-28 DIAGNOSIS — R7881 Bacteremia: Secondary | ICD-10-CM

## 2020-04-28 DIAGNOSIS — N170 Acute kidney failure with tubular necrosis: Secondary | ICD-10-CM

## 2020-04-28 DIAGNOSIS — I87022 Postthrombotic syndrome with inflammation of left lower extremity: Secondary | ICD-10-CM

## 2020-04-28 DIAGNOSIS — L981 Factitial dermatitis: Secondary | ICD-10-CM

## 2020-04-28 MED ORDER — LINEZOLID 600 MG PO TABS
600.0000 mg | ORAL_TABLET | Freq: Two times a day (BID) | ORAL | Status: DC
  Administered 2020-04-29: 08:00:00 600 mg via ORAL
  Filled 2020-04-28 (×2): qty 1

## 2020-04-28 MED ORDER — VANCOMYCIN 50 MG/ML ORAL SOLUTION
125.0000 mg | Freq: Three times a day (TID) | ORAL | Status: DC
  Administered 2020-04-28 – 2020-04-29 (×3): 125 mg via ORAL
  Filled 2020-04-28 (×6): qty 2.5

## 2020-04-28 MED ORDER — SACUBITRIL-VALSARTAN 24-26 MG PO TABS
1.0000 | ORAL_TABLET | Freq: Two times a day (BID) | ORAL | Status: DC
  Administered 2020-04-28 – 2020-04-29 (×3): 1 via ORAL
  Filled 2020-04-28 (×4): qty 1

## 2020-04-28 MED ORDER — APIXABAN 2.5 MG PO TABS
2.5000 mg | ORAL_TABLET | Freq: Two times a day (BID) | ORAL | Status: DC
  Administered 2020-04-28 – 2020-04-29 (×3): 2.5 mg via ORAL
  Filled 2020-04-28 (×3): qty 1

## 2020-04-28 MED ORDER — RISAQUAD PO CAPS
1.0000 | ORAL_CAPSULE | Freq: Every day | ORAL | Status: DC
  Administered 2020-04-28 – 2020-04-29 (×2): 1 via ORAL
  Filled 2020-04-28 (×2): qty 1

## 2020-04-28 MED ORDER — BISOPROLOL FUMARATE 5 MG PO TABS
5.0000 mg | ORAL_TABLET | Freq: Every day | ORAL | Status: DC
  Administered 2020-04-29: 5 mg via ORAL
  Filled 2020-04-28 (×2): qty 1

## 2020-04-28 MED ORDER — LINEZOLID 600 MG PO TABS
600.0000 mg | ORAL_TABLET | Freq: Two times a day (BID) | ORAL | 0 refills | Status: AC
Start: 2020-04-29 — End: 2020-05-06

## 2020-04-28 MED ORDER — VANCOMYCIN HCL 125 MG PO CAPS: 125.0000 mg | ORAL_CAPSULE | Freq: Three times a day (TID) | ORAL | 0 refills | Status: DC

## 2020-04-28 MED ORDER — TRAZODONE HCL 50 MG PO TABS
50.0000 mg | ORAL_TABLET | Freq: Every day | ORAL | Status: DC
  Administered 2020-04-28: 22:00:00 50 mg via ORAL
  Filled 2020-04-28: qty 1

## 2020-04-28 NOTE — Evaluation (Signed)
Physical Therapy Evaluation Patient Details Name: Regina White MRN: [id number] DOB: May 08, 1956 Today's Date: 04/28/2020   History of Present Illness  Pt is a 64 yo female that presented to ED for pain, weakness, SOB. Workup + for septic shock with acute on chronic hypoxic respiratory failure, acute renal failure, L buttock skin wound, UTI, hypotension. 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    Clinical Impression  Pt alert, tearful throughout session, often expressed frustration with medical status. For the last month the patient has been bedbound, has been unable transfer with sit to stand equipment at home due to [place] being unable to see pt, and for the last week the patient has been unable to perform exercises in bed due to significant L hip pain and feeling unwell. Pt is usually able to stand pivot transfer with equipment and assistance.  The patient was able to perform several supine exercises of RLE with AAROM, several rest breaks needed due to L hip pain. Pt unable to move LLE independently, unwilling to have PT assist due to significant pain. PT attempted to raise HOB to assess pt response to more upright position, pt unable to tolerate began crying, moaning.  Overall the patient demonstrated deficits (see "PT Problem List") that impede the patient's functional abilities, safety, and mobility and would benefit from skilled PT intervention. Recommendation is SNF, family/pt adamant about returning home.      Follow Up Recommendations SNF    Equipment Recommendations  None recommended by PT    Recommendations for Other Services OT consult     Precautions / Restrictions Precautions Precautions: Fall Restrictions Weight Bearing Restrictions: No      Mobility  Bed Mobility                General bed mobility comments: deferred due to pt complaints of pain; attempted to elevate HOB, pt began to cry to due pain  Transfers                 General transfer comment: deferred  Ambulation/Gait                Stairs            Wheelchair Mobility    Modified Rankin (Stroke Patients Only)       Balance                                             Pertinent Vitals/Pain Pain Assessment: 0-10 Pain Score: 10-Worst pain ever Pain Location: L hip with any movement Pain Descriptors / Indicators: Burning;Sharp;Constant;Crying Pain Intervention(s): Limited activity within patient's tolerance;Monitored during session;Repositioned    Home Living Family/patient expects to be discharged to:: Private residence Living Arrangements: Spouse/significant other Available Help at Discharge: Family;Available 24 hours/day Type of Home: House Home Access: Stairs to enter;Ramped entrance   Entrance Stairs-Number of Steps: Bi-level: 6 steps with B railings (not able to reach both) plus 6 steps with B railings (able to reach both) to main level. Has stair lift to navigate stairs   Home Equipment: [place] Rile - standard;Walker - 4 wheels;Shower seat;Grab bars - toilet;Grab bars - tub/shower      Prior Function Level of Independence: Needs assistance   Gait /  Transfers Assistance Needed: per Pt and family, pt uses stand/pivot equipment at home for transfers, has been unable to for the last month due to PT discontinuation and pt not feeling well/significant L hip pain  ADL's / Homemaking Assistance Needed: Assist for ADLs, performed bed-level        Hand Dominance   Dominant Hand: Right    Extremity/Trunk Assessment   Upper Extremity Assessment Upper Extremity Assessment: Overall WFL for tasks assessed    Lower Extremity Assessment Lower Extremity Assessment: Generalized weakness (Pt unable to L LLE against gravity, unwilling for PT  assist due to significant pain. AAROM from RLE)       Communication   Communication: No difficulties  Cognition Arousal/Alertness: Awake/alert Behavior During Therapy: Anxious (tearful) Overall Cognitive Status: Within Functional Limits for tasks assessed                                        General Comments      Exercises Other Exercises Other Exercises: Pt performed ankle pumps, heel slides, SLR and hip abduction/adduction with AAROM to RLE, x15. Needed breaks during reps due to increased L hip pain   Assessment/Plan    PT Assessment Patient needs continued PT services  PT Problem List Decreased strength;Decreased range of motion;Decreased knowledge of use of DME;Decreased activity tolerance;Decreased balance;Decreased mobility;Pain       PT Treatment Interventions DME instruction;Balance training;Gait training;Neuromuscular re-education;Therapeutic activities;Therapeutic exercise;Functional mobility training;Wheelchair mobility training;Patient/family education    PT Goals (Current goals can be found in the Care Plan section)  Acute Rehab PT Goals Patient Stated Goal: to decrease pain PT Goal Formulation: With patient Time For Goal Achievement: 05/12/20 Potential to Achieve Goals: Good    Frequency Min 2X/week   Barriers to discharge        Co-evaluation               AM-PAC PT "6 Clicks" Mobility  Outcome Measure Help needed turning from your back to your side while in a flat bed without using bedrails?: Total Help needed moving from lying on your back to sitting on the side of a flat bed without using bedrails?: Total Help needed moving to and from a bed to a chair (including a wheelchair)?: Total Help needed standing up from a chair using your arms (e.g., wheelchair or bedside chair)?: Total Help needed to walk in hospital room?: Total Help needed climbing 3-5 steps with a railing? : Total 6 Click Score: 6    End of Session    Activity Tolerance: Patient limited by pain Patient left: in bed;with family/visitor present;with call bell/phone within reach Nurse Communication: Mobility status PT Visit Diagnosis: Muscle weakness (generalized) (M62.81);Other abnormalities of gait and mobility (R26.89);Pain Pain - Right/Left: Left Pain - part of body: Hip    Time: 1430-1459 PT Time Calculation (min) (ACUTE ONLY): 29 min   Charges:   PT Evaluation $PT Eval Moderate Complexity: 1 Mod PT Treatments $Therapeutic Exercise: 8-22 mins       Olga Coaster PT, DPT 4:19 PM,04/28/20

## 2020-04-28 NOTE — Consult Note (Signed)
NAMESANELA White  DOB: Sep 28, 1956  MRN: [id number]  Date/Time: 04/28/2020 11:08 AM  REQUESTING PROVIDER: Dr. Allena Katz Subjective:  REASON FOR CONSULT: Bacteremia ?History from patient and chart reviewed Regina White is a 64 y.o. female with a history of COPD on home oxygen, CHF, pulmonary hypertension, Covid illness in December 2020 history of  recurrent C. 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 As per patient the nurse who came to give her 2nd dose of vaccine on 04/24/20 found her to be unwell with chills and sob and called EMS and she was brought to the ED. Pt has been with decreased mobility pretty much bed bound for many months now  Pt has a complicated medical hisotry- followed at [facility] Patient in December 2020 had Covid illness and was admitted to [facility] and was given remdesivir, steroids.  During that hospitalization there was documentation of a pressure injury noted on the buttock stage II which was been present from 08/05/2019 and it was copied from an outside note which said partial thickness loss of dermis presenting as a shallow open ulcer with a red-pink wound bed without slough  Vitals in the ED was blood pressure of 67/45, temperature of 98.5, heart rate of 71 and respiratory rate of 40 and pulse ox of 95%. Labs revealed creatinine of 0.97, glucose of 146, lactate initially was 1.5 but increased to 2.6, WBC of 12.7, hemoglobin of 14.6 and platelet of 160.  LFTs were normal.  Albumin was 3.3.  Blood cultures were sent.  CT angio revealed no pulmonary embolus with stable dilatation of the main pulmonary artery consistent with pulmonary artery hypertension.  There was marked  cardiomegaly and right ventricular dilatation.  There was emphysema.  CT abdomen revealed cholelithiasis without cholecystitis and enlarged fibroid uterus and moderate fecal retention.  The initial concern was sepsis secondary to UTI with concern for infected decubitus ulcer. She was initially started on vancomycin and meropenem.  Early 18th morning she developed abnormal heart rhythm and hypotension hence was transferred to ICU.  She had an arterial line placement.  She also had a right IJ placement.  She was evaluated by surgery who thought it was superficial cellulitis of the posterior thigh back and lower back without any clinical or radiological imaging evidence of an abscess or necrotizing infection.Marland Kitchen  She was evaluated by cardiology who saw that her EF was down to 35% from 50% previously.  And they recommended that once blood pressure stabilizes to start outpatient medications which was bisoprolol and Entresto and possible Lasix.    Blood culture came positive for group B streptococcus and patient's antibiotics were switched to ceftriaxone.  I am asked to see the patient for the same.    Pt says she has had recurrent cdiff - on checking [facility] records she has had multiple episodes in 2019 and In feb 2020 underwent FMT.  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 She says she has had reduced strength and weakness in the rt arm since then.  She  has had pain and swelling since then- also has swelling- she was asked to wear compression wrapsShe was in rehab for many months and then went to stay with her daughter- says that wearing depends at night caused her skin to break down with already        Past Medical History:  Diagnosis Date  . Asthma   . CHF  (congestive heart failure) (HCC)   . Chronic back pain   . COPD (chronic obstructive pulmonary disease) (HCC)   . 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 BACK SURGERY     fusion venoplast Stent femora  Social History   Socioeconomic History  . Marital status: Married    Spouse name: Not on file  . Number of children: Not on file  . Years of education: Not on file  . Highest education level: Not on file  Occupational History  . Not on file  Tobacco Use  . Smoking status: Former Smoker    Packs/day: 1.00    Years: 30.00    Pack years: 30.00    Types: Cigarettes    Quit date: 06/09/1999    Years since quitting: 20.9  . Smokeless tobacco: Never Used  Vaping Use  . Vaping Use: Never used  Substance and Sexual Activity  . Alcohol use: No  . Drug use: Never  . Sexual activity: Not Currently  Other Topics Concern  . Not on file  Social History Narrative   [facility] at this time   Social Determinants of Health   Financial Resource Strain:   . Difficulty of Paying Living Expenses:   Food Insecurity:   . Worried About Programme researcher, broadcasting/film/video in the Last Year:   . Barista in the Last Year:   Transportation Needs:   . Freight forwarder (Medical):   Marland Kitchen Lack of Transportation (Non-Medical):   Physical Activity:   . Days of Exercise per Week:   . Minutes of Exercise per Session:   Stress:   . Feeling of Stress :   Social Connections:   . Frequency of Communication with Friends and Family:   . Frequency of Social Gatherings with Friends and Family:   . Attends Religious Services:   . Active Member of Clubs or Organizations:   . Attends Banker Meetings:   Marland Kitchen Marital Status:   Intimate Partner Violence:   . Fear of Current or Ex-Partner:   . Emotionally Abused:   Marland Kitchen Physically Abused:   . Sexually Abused:     Family  History  Problem Relation Age of Onset  . Hypertension Mother   . Liver cancer Father    Allergies  Allergen Reactions  . Penicillins Anaphylaxis    Breathing problems Did it involve swelling of the face/tongue/throat, SOB, or low BP? Yes Did it involve sudden or severe rash/hives, skin peeling, or any reaction on the inside of your mouth or nose? Unknown Did you need to seek medical attention at a hospital or doctor's office? Unknown When did it last happen?unknown If all above answers are "NO", may proceed with cephalosporin use. Patient tolerated Ceftriaxone September 2020  . Erythromycin Nausea And Vomiting and Rash    STOMACH PAIN  . Levofloxacin Other (See Comments)    Muscle aches  . Lidocaine Rash  . Clindamycin Other (See Comments)    Recurrent C  diff  . Levothyroxine Other (See Comments)    Vaginal bleeding Reaction to generic (pt currently takes name brand Synthroid)  . Tiotropium Other (See Comments)    Coughing up blood  . Doxycycline Nausea And Vomiting  . Sulfa Antibiotics Rash    ? Current Facility-Administered Medications  Medication Dose Route Frequency Provider Last Rate Last Admin  . 0.9 %  sodium chloride infusion  250 mL Intravenous Continuous [name], NP   Stopped at 04/26/20 1014  . acetaminophen (TYLENOL) tablet 650 mg  650 mg Oral Q6H PRN Rometta Emery, MD   650 mg at 04/25/20 1106   Or  . acetaminophen (TYLENOL) suppository 650 mg  650 mg Rectal Q6H PRN Rometta Emery, MD      . acidophilus (RISAQUAD) capsule 1 capsule  1 capsule Oral Daily Enedina Finner, MD      . albuterol (PROVENTIL) (2.5 MG/3ML) 0.083% nebulizer solution 2.5 mL  2.5 mL Inhalation Q6H PRN Rometta Emery, MD      . apixaban (ELIQUIS) tablet 2.5 mg  2.5 mg Oral BID Enedina Finner, MD      . atropine 1 MG/10ML injection 1 mg  1 mg Intravenous Continuous PRN Eugenie Norrie, NP      . bisoprolol (ZEBETA) tablet 5 mg  5 mg Oral Daily Enedina Finner, MD      .  cefTRIAXone (ROCEPHIN) 2 g in sodium chloride 0.9 % 100 mL IVPB  2 g Intravenous Q24H Salena Saner, MD   Stopped at 04/27/20 1036  . Chlorhexidine Gluconate Cloth 2 % PADS 6 each  6 each Topical Q0600 Rometta Emery, MD   6 each at 04/28/20 [phone]  . cholecalciferol (VITAMIN D3) tablet 2,000 Units  2,000 Units Oral Daily Rometta Emery, MD   2,000 Units at 04/28/20 0948  . famotidine (PEPCID) tablet 20 mg  20 mg Oral Daily Rometta Emery, MD   20 mg at 04/28/20 0948  . guaiFENesin-dextromethorphan (ROBITUSSIN DM) 100-10 MG/5ML syrup 5 mL  5 mL Oral Q4H PRN Rometta Emery, MD      . HYDROcodone-acetaminophen (NORCO/VICODIN) 5-325 MG per tablet 1-2 tablet  1-2 tablet Oral Q6H PRN Enedina Finner, MD   2 tablet at 04/28/20 0947  . ipratropium (ATROVENT) nebulizer solution 0.25 mg  0.25 mg Inhalation Q6H PRN Rometta Emery, MD      . levothyroxine (SYNTHROID) tablet 200 mcg  200 mcg Oral Q0600 Rometta Emery, MD   200 mcg at 04/28/20 0626  . melatonin tablet 5 mg  5 mg Oral QHS Manuela Schwartz, NP   5 mg at 04/27/20 2042  . mometasone-formoterol (DULERA) 200-5 MCG/ACT inhaler 2 puff  2 puff Inhalation BID Rometta Emery, MD   2 puff at 04/27/20 2042  . montelukast (SINGULAIR) tablet 10 mg  10 mg Oral QHS Rometta Emery, MD   10 mg at 04/27/20 2042  . morphine 2 MG/ML injection 2 mg  2 mg Intravenous Q4H PRN Manuela Schwartz, NP   2 mg at 04/28/20 0636  . ondansetron (ZOFRAN) tablet 4 mg  4 mg Oral Q6H PRN Rometta Emery, MD       Or  . ondansetron (ZOFRAN) injection 4 mg  4 mg Intravenous Q6H PRN Garba, Mohammad L, MD      . polyethylene glycol (MIRALAX / GLYCOLAX) packet 17 g  17 g Oral Daily PRN Rometta Emery, MD      . sacubitril-valsartan (ENTRESTO)  24-26 mg per tablet  1 tablet Oral BID Enedina Finner, MD      . simethicone [facility]) chewable tablet 80 mg  80 mg Oral Q6H PRN Enedina Finner, MD      . traMADol Janean Sark) tablet 50 mg  50 mg Oral Q6H PRN Enedina Finner, MD   50  mg at 04/27/20 0718  . traZODone (DESYREL) tablet 150 mg  150 mg Oral QHS Rometta Emery, MD   150 mg at 04/27/20 2042     Abtx:  Anti-infectives (From admission, onward)   Start     Dose/Rate Route Frequency Ordered Stop   04/25/20 1000  cefTRIAXone (ROCEPHIN) 2 g in sodium chloride 0.9 % 100 mL IVPB     Discontinue     2 g 200 mL/hr over 30 Minutes Intravenous Every 24 hours 04/25/20 0918     04/25/20 1000  linezolid (ZYVOX) IVPB 600 mg  Status:  Discontinued        600 mg 300 mL/hr over 60 Minutes Intravenous Every 12 hours 04/25/20 0918 04/27/20 0859   04/24/20 2300  vancomycin (VANCOREADY) IVPB 1250 mg/250 mL  Status:  Discontinued        1,250 mg 166.7 mL/hr over 90 Minutes Intravenous Every 12 hours 04/24/20 1855 04/25/20 0917   04/24/20 2200  aztreonam (AZACTAM) 1 g in sodium chloride 0.9 % 100 mL IVPB  Status:  Discontinued        1 g 200 mL/hr over 30 Minutes Intravenous Every 8 hours 04/24/20 1855 04/25/20 0917   04/24/20 2030  metroNIDAZOLE (FLAGYL) IVPB 500 mg  Status:  Discontinued        500 mg 100 mL/hr over 60 Minutes Intravenous Every 8 hours 04/24/20 2026 04/25/20 0917   04/24/20 2030  vancomycin (VANCOCIN) IVPB 1000 mg/200 mL premix  Status:  Discontinued        1,000 mg 200 mL/hr over 60 Minutes Intravenous  Once 04/24/20 2026 04/24/20 2128   04/24/20 1415  meropenem (MERREM) 1 g in sodium chloride 0.9 % 100 mL IVPB        1 g 200 mL/hr over 30 Minutes Intravenous  Once 04/24/20 1400 04/24/20 1542   04/24/20 1400  vancomycin (VANCOCIN) IVPB 1000 mg/200 mL premix        1,000 mg 200 mL/hr over 60 Minutes Intravenous  Once 04/24/20 1348 04/24/20 1647      REVIEW OF SYSTEMS:  Const: fever,  chills, negative weight loss Eyes: negative diplopia or visual changes, negative eye pain ENT: negative coryza, negative sore throat Resp: negative cough, hemoptysis, has severe dyspnea Cards: negative for chest pain, palpitations, has lower extremity edema GU: negative  for frequency, dysuria and hematuria GI: Negative for abdominal pain, diarrhea, bleeding, constipation Skin: as above Heme: negative for easy bruising and gum/nose bleeding MS: back pain, left leg pain. Neurolo:negative for headaches, dizziness, vertigo, memory problems  Psych: anxiety, depression Endocrine: hypothyroidism Allergy/Immunology- multiple allergies as listed above  Objective:  VITALS:  BP (!) 137/104 (BP Location: Right Leg)   Pulse 79   Temp 98 F (36.7 C)   Resp 16   Ht 5\' 5"  (1.651 m)   Wt 105.5 kg   LMP  (LMP Unknown)   SpO2 94%   BMI 38.70 kg/m  PHYSICAL EXAM:  General: Alert, cooperative, very tearful, appears stated age.  Head: Normocephalic, without obvious abnormality, atraumatic. Eyes: Conjunctivae clear, anicteric sclerae. Pupils are equal ENT Nares normal. No drainage or sinus tenderness. Lips, mucosa, and tongue  normal. No Thrush Neck: Supple, symmetrical, no adenopathy, thyroid: non tender no carotid bruit and no JVD.  Lungs: b/l air entry Heart: s1s2 Abdomen: Soft, non-tender,not distended. Bowel sounds normal. No masses Extremities:edema left leg Back: .6/21 Skin:     04/24/20        Lymph: Cervical, supraclavicular normal. Neurologic: did not examine in detail  Pertinent Labs Lab Results CBC    Component Value Date/Time   WBC 7.6 04/27/2020 0440   RBC 4.22 04/27/2020 0440   HGB 11.8 (L) 04/27/2020 0440   HCT 36.3 04/27/2020 0440   PLT 154 04/27/2020 0440   MCV 86.0 04/27/2020 0440   MCH 28.0 04/27/2020 0440   MCHC 32.5 04/27/2020 0440   RDW 17.9 (H) 04/27/2020 0440   LYMPHSABS 1.5 04/27/2020 0440   MONOABS 0.4 04/27/2020 0440   EOSABS 0.1 04/27/2020 0440   BASOSABS 0.0 04/27/2020 0440    CMP Latest Ref Rng & Units 04/27/2020 04/26/2020 04/25/2020  Glucose 70 - 99 mg/dL 88 137(H) 138(H)  BUN 8 - 23 mg/dL 40(H) 36(H) 41(H)  Creatinine 0.44 - 1.00 mg/dL 0.85 0.81 0.90  Sodium 135 - 145 mmol/L 139 139 139  Potassium  3.5 - 5.1 mmol/L 3.6 4.1 4.3  Chloride 98 - 111 mmol/L 110 112(H) 107  CO2 22 - 32 mmol/L 22 22 18(L)  Calcium 8.9 - 10.3 mg/dL 8.6(L) 8.3(L) 8.2(L)  Total Protein 6.5 - 8.1 g/dL 4.8(L) 5.4(L) -  Total Bilirubin 0.3 - 1.2 mg/dL 0.5 0.7 -  Alkaline Phos 38 - 126 U/L 43 50 -  AST 15 - 41 U/L 10(L) 12(L) -  ALT 0 - 44 U/L 13 18 -      Microbiology: Recent Results (from the past 240 hour(s))  Culture, blood (Routine x 2)     Status: Abnormal   Collection Time: 04/24/20  1:30 PM   Specimen: BLOOD  Result Value Ref Range Status   Specimen Description   Final    BLOOD LEFT ANTECUBITAL Performed at [facility], [address] [place], [place] [address]    Special Requests   Final    BOTTLES DRAWN AEROBIC AND ANAEROBIC Blood Culture adequate volume Performed at [facility], [address] [place], [place] [address]    Culture  Setup Time   Final    GRAM POSITIVE COCCI IN BOTH AEROBIC AND ANAEROBIC BOTTLES CRITICAL RESULT CALLED TO, READ BACK BY AND VERIFIED WITH: Alexander [id number] 04/25/20 HNM Performed at [facility], [place] [address], [place], [place] [address]    Culture GROUP B STREP(S.AGALACTIAE)ISOLATED (A)  Final   Report Status 04/27/2020 FINAL  Final   Organism ID, Bacteria GROUP B STREP(S.AGALACTIAE)ISOLATED  Final      Susceptibility   Group b strep(s.agalactiae)isolated - MIC*    CLINDAMYCIN <=0.25 SENSITIVE Sensitive     AMPICILLIN <=0.25 SENSITIVE Sensitive     ERYTHROMYCIN <=0.12 SENSITIVE Sensitive     VANCOMYCIN 0.5 SENSITIVE Sensitive     CEFTRIAXONE <=0.12 SENSITIVE Sensitive     LEVOFLOXACIN 0.5 SENSITIVE Sensitive     PENICILLIN Value in next row Sensitive      SENSITIVE<=0.06    * GROUP B STREP(S.AGALACTIAE)ISOLATED  Blood Culture ID Panel (Reflexed)     Status: Abnormal   Collection Time: 04/24/20  1:30 PM  Result Value Ref Range Status   Enterococcus species NOT DETECTED NOT DETECTED Final   Listeria monocytogenes  NOT DETECTED NOT DETECTED Final   Staphylococcus species NOT DETECTED NOT  DETECTED Final   Staphylococcus aureus (BCID) NOT DETECTED NOT DETECTED Final   Streptococcus species DETECTED (A) NOT DETECTED Final    Comment: CRITICAL RESULT CALLED TO, READ BACK BY AND VERIFIED WITH: DAVID BESANTI PHARMD [id number] 04/25/20 HNM    Streptococcus agalactiae DETECTED (A) NOT DETECTED Final    Comment: CRITICAL RESULT CALLED TO, READ BACK BY AND VERIFIED WITH: DAVID BESNATI PHARMD [id number] 04/25/20 HNM    Streptococcus pneumoniae NOT DETECTED NOT DETECTED Final   Streptococcus pyogenes NOT DETECTED NOT DETECTED Final   Acinetobacter baumannii NOT DETECTED NOT DETECTED Final   Enterobacteriaceae species NOT DETECTED NOT DETECTED Final   Enterobacter cloacae complex NOT DETECTED NOT DETECTED Final   Escherichia coli NOT DETECTED NOT DETECTED Final   Klebsiella oxytoca NOT DETECTED NOT DETECTED Final   Klebsiella pneumoniae NOT DETECTED NOT DETECTED Final   Proteus species NOT DETECTED NOT DETECTED Final   Serratia marcescens NOT DETECTED NOT DETECTED Final   Haemophilus influenzae NOT DETECTED NOT DETECTED Final   Neisseria meningitidis NOT DETECTED NOT DETECTED Final   Pseudomonas aeruginosa NOT DETECTED NOT DETECTED Final   Candida albicans NOT DETECTED NOT DETECTED Final   Candida glabrata NOT DETECTED NOT DETECTED Final   Candida krusei NOT DETECTED NOT DETECTED Final   Candida parapsilosis NOT DETECTED NOT DETECTED Final   Candida tropicalis NOT DETECTED NOT DETECTED Final    Comment: Performed at [facility], [address] [place], [place] [address]  Culture, blood (Routine x 2)     Status: None (Preliminary result)   Collection Time: 04/24/20  3:09 PM   Specimen: BLOOD  Result Value Ref Range Status   Specimen Description BLOOD BLOOD LEFT HAND  Final   Special Requests   Final    BOTTLES DRAWN AEROBIC ONLY Blood Culture adequate volume   Culture   Final    NO GROWTH 4  DAYS Performed at [facility], [address], [place], [place] [address]    Report Status PENDING  Incomplete  SARS Coronavirus 2 by RT PCR (hospital order, performed in [facility] hospital lab) Nasopharyngeal Nasopharyngeal Swab     Status: None   Collection Time: 04/24/20  3:38 PM   Specimen: Nasopharyngeal Swab  Result Value Ref Range Status   SARS Coronavirus 2 NEGATIVE NEGATIVE Final    Comment: (NOTE) SARS-CoV-2 target nucleic acids are NOT DETECTED.  The SARS-CoV-2 RNA is generally detectable in upper and lower respiratory specimens during the acute phase of infection. The lowest concentration of SARS-CoV-2 viral copies this assay can detect is 250 copies / mL. A negative result does not preclude SARS-CoV-2 infection and should not be used as the sole basis for treatment or other patient management decisions.  A negative result may occur with improper specimen collection / handling, submission of specimen other than nasopharyngeal swab, presence of viral mutation(s) within the areas targeted by this assay, and inadequate number of viral copies (<250 copies / mL). A negative result must be combined with clinical observations, patient history, and epidemiological information.  Fact Sheet for Patients:   BoilerBrush.com.cy  Fact Sheet for Healthcare Providers: https://pope.com/  This test is not yet approved or  cleared by the [place] FDA and has been authorized for detection and/or diagnosis of SARS-CoV-2 by FDA under an Emergency Use Authorization (EUA).  This EUA will remain in effect (meaning this test can be used) for the duration of the COVID-19 declaration under Section 564(b)(1) of the Act, 21 U.S.C. section 360bbb-3(b)(1), unless  the authorization is terminated or revoked sooner.  Performed at [facility], [address] [place], [place] [address]   Urine culture     Status: Abnormal    Collection Time: 04/24/20  5:08 PM   Specimen: In/Out Cath Urine  Result Value Ref Range Status   Specimen Description   Final    IN/OUT CATH URINE Performed at [facility], [address] [place], [place] [address]    Special Requests   Final    NONE Performed at [facility], [address] [place], [place] [address]    Culture (A)  Final    40,000 COLONIES/mL MULTIPLE SPECIES PRESENT, SUGGEST RECOLLECTION   Report Status 04/25/2020 FINAL  Final  MRSA PCR Screening     Status: None   Collection Time: 04/24/20  8:27 PM   Specimen: Nasopharyngeal  Result Value Ref Range Status   MRSA by PCR NEGATIVE NEGATIVE Final    Comment:        The GeneXpert MRSA Assay (FDA approved for NASAL specimens only), is one component of a comprehensive MRSA colonization surveillance program. It is not intended to diagnose MRSA infection nor to guide or monitor treatment for MRSA infections. Performed at [facility], [address] [place], [place] [address]     IMAGING RESULTS:  I have personally reviewed the films Cardiomegaly on CXR ? Impression/Recommendation ? Group B streptococcus bacteremia- source skin - on the back which has multiple teats and also cellulitic on admission- much improved now On ceftriaxone Pt does not want to take IV antibiotics at home So will switch to Po linezolid for a week Explained side effects  including thrombocytopenia, serotonin syndrome , neuropathy  Recurrent cdiff colitis needing FMT in 2020. Will prophylactically give PO vanco while on antibiotic and to continue for a week after stopping antibiotic ? Bed bound with pressure tears back- stage II Needs PT to improve mobility  Post phlebitic left leg- with edema/pain- needs compression wraps  H/o lumbar spine fusion  H/o Rt arm hematoma from over anticoagulation in 2020 Leading to some residual weakness rt arm  ? _H/o COVID illness  Acute hypoxic resp  failure- combination of CHF, COPD,   __________________________________________________ Discussed with patient, and her husband requesting provider Note:  This document was prepared using Dragon voice recognition software and may include unintentional dictation errors.

## 2020-04-28 NOTE — Care Management Important Message (Signed)
Important Message  Patient Details  Name: Regina White MRN: [id number] Date of Birth: 10-07-1956   Medicare Important Message Given:  Yes     Olegario Messier A Langdon Crosson 04/28/2020, 2:43 PM

## 2020-04-28 NOTE — Progress Notes (Signed)
[facility] [place] NAME: Regina White    MR#:  [id number]  DATE OF BIRTH:  04-10-1956  SUBJECTIVE:  husband in the room.  Patient tearful this morning. She is frustrated with her chronic left hip pain and severe DJD. She is hoping to see the orthopedic surgeon at [facility] in August and see if there any options with her.   denies any fever. Eating well. She knows the Foley catheter will be coming out today. REVIEW OF SYSTEMS:   Review of Systems  Constitutional: Positive for malaise/fatigue. Negative for chills, fever and weight loss.  HENT: Negative for ear discharge, ear pain and nosebleeds.   Eyes: Negative for blurred vision, pain and discharge.  Respiratory: Negative for sputum production, shortness of breath, wheezing and stridor.   Cardiovascular: Negative for chest pain, palpitations, orthopnea and PND.  Gastrointestinal: Negative for abdominal pain, diarrhea, nausea and vomiting.  Genitourinary: Negative for frequency and urgency.  Musculoskeletal: Positive for joint pain. Negative for back pain.  Neurological: Positive for weakness. Negative for sensory change, speech change and focal weakness.  Psychiatric/Behavioral: Negative for depression and hallucinations. The patient is not nervous/anxious.    Tolerating Diet:yes Tolerating PT: bed bound  DRUG ALLERGIES:   Allergies  Allergen Reactions  . Penicillins Anaphylaxis    Breathing problems Did it involve swelling of the face/tongue/throat, SOB, or low BP? Yes Did it involve sudden or severe rash/hives, skin peeling, or any reaction on the inside of your mouth or nose? Unknown Did you need to seek medical attention at a hospital or doctor's office? Unknown When did it last happen?unknown If all above answers are "NO", may proceed with cephalosporin use. Patient tolerated Ceftriaxone September 2020  . Erythromycin Nausea And Vomiting and Rash    STOMACH PAIN  .  Levofloxacin Other (See Comments)    Muscle aches  . Lidocaine Rash  . Clindamycin Other (See Comments)    Recurrent C diff  . Levothyroxine Other (See Comments)    Vaginal bleeding Reaction to generic (pt currently takes name brand Synthroid)  . Tiotropium Other (See Comments)    Coughing up blood  . Doxycycline Nausea And Vomiting  . Sulfa Antibiotics Rash    VITALS:  Blood pressure (!) 134/92, pulse 69, temperature (!) 97.5 F (36.4 C), temperature source Oral, resp. rate 18, height 5\' 5"  (1.651 m), weight 105.5 kg, SpO2 92 %.  PHYSICAL EXAMINATION:   Physical Exam  GENERAL:  64 y.o.-year-old patient lying in the bed with no acute distress.morbid obesity  EYES: Pupils equal, round, reactive to light and accommodation. No scleral icterus.   HEENT: Head atraumatic, normocephalic. Oropharynx and nasopharynx clear.  NECK:  Supple, no jugular venous distention. No thyroid enlargement, no tenderness.  LUNGS: Normal breath sounds bilaterally, no wheezing, rales, rhonchi. No use of accessory muscles of respiration.  CARDIOVASCULAR: S1, S2 normal. No murmurs, rubs, or gallops.  ABDOMEN: Soft, nontender, nondistended. Bowel sounds present. No organomegaly or mass.  Right femoral line+ (June 18th) Foley+ (6/18)--to be removed today EXTREMITIES: No cyanosis, clubbing  + edema b/l.    NEUROLOGIC: non faocl PSYCHIATRIC:  patient is alert and oriented x 3.  SKIN:    LABORATORY PANEL:  CBC Recent Labs  Lab 04/27/20 0440  WBC 7.6  HGB 11.8*  HCT 36.3  PLT 154    Chemistries  Recent Labs  Lab 04/27/20 0440  NA 139  K 3.6  CL 110  CO2 22  GLUCOSE  88  BUN 40*  CREATININE 0.85  CALCIUM 8.6*  MG 1.9  AST 10*  ALT 13  ALKPHOS 43  BILITOT 0.5   Cardiac Enzymes No results for input(s): TROPONINI in the last 168 hours. RADIOLOGY:  No results found. ASSESSMENT AND PLAN:   Regina White is a 64 y.o. female with medical history significant of COPD on 3 L oxygen at  home, diastolic CHF, pulmonary hypertension, asthma, chronic pain is syndrome, pulmonary hypertension, who came to the ER with significant pain weakness and was found to be septic.  She has a wound on the left side of her buttocks.  She was also short of breath.  *Septic shock with acute on chronic hypoxic respiratory failure in acute renal failure POA--improved now -source urine and decubitus -pt was on phenylephrine and vasopressin--weaned off -stress dose IV steroids--d/c it -IV fluids--d/c  -appreciate ICU attending input -blood culture positive for Streptococcus agalactaie  -IV Rocephin for now -ID consultation placed for help with IV antibiotics.  Will await final recommendation.  *Acute renal failure secondary to hypotension in the setting of septic shock -was on  IV fluids and IV pressors--now weaned off -Avoid nephrotoxic agents - creatinine normal -Good UOP -d/c foley today. Pt informed  *Infected small decubitus with erythematous skin over hte bittocks -seen by surgery no surgical intervention needed. 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 - Patient is bedbound for more than a year -she has morphine pump for many years due to her chronic back pain -complains of excruciating pain and unable to move without causing a lot of pain -palliative care consultation for goals of care -PRN pain meds as well as allow -add prn ultran and norco for pain -d/c IV fentanyl  *Incidental finding of cholelithiasis of CT scan -labs stable continue to monitor  *Dilated cardiomyopathy. Patient follows at [facility]. -Echo shows EF of 35%. -As outpatient patient is on bisoprolol and entersto--resumed both meds at home dose since BP stable -mild elevated BNP.  -pt sats stable -good uop -prn lasix -appreciated [facility] cards input  *Acute hypoxic respiratory  failure secondary to COPD exacerbation and mild CHF systolic acute -PRN Lasix -inhalers/nebulizer -patient uses chronic oxygen 4 L at home--- post COVID infection in December 2020 -cardiac meds as above  *Hypothyroidism continue Synthroid   TOC for d/c planning-- patient wishes to go home with resumption of home health services.  Procedures:arterial line--removed Family communication : husband in the room Consults :cardiology. ICU CODE STATUS: FULL DVT Prophylaxis :heparin  Status is: Inpatient  Remains inpatient appropriate because:IV treatments appropriate due to intensity of illness or inability to take PO   Dispo: The patient is from: Home              Anticipated d/c is to: Home with [facility] (per pt) likely Tuesday              Anticipated d/c date is: > 3 days              Patient currently is not medically stable to d/c.  awaiting ID recommendations for antibiotics. If no further workup required likely discharge Tuesday    TOTAL TIME TAKING CARE OF THIS PATIENT: 25 minutes.  >50% time spent on counselling and coordination of care  Note: This dictation was prepared with Dragon dictation along with smaller phrase technology. Any transcriptional  errors that result from this process are unintentional.  Enedina Finner M.D    [facility]   CC: Primary care physician; Cain Sieve, MDPatient ID: Regina White, female   DOB: 1956/03/12, 64 y.o.   MRN: [id number]

## 2020-04-28 NOTE — TOC Progression Note (Signed)
Transition of Care [facility] - Progression Note    Patient Details  Name: Regina White MRN: [id number] Date of Birth: [birth year]  Transition of [facility]) CM/SW [place], RN Phone Number: 04/28/2020, 9:25 AM  Clinical Narrative:     Met with the patient to talk about [place] needs, She already has a Home health agency that she is open with for wound care, She does not know the name if it but her husband is coming later today and he will have the information, I explained she would continue with that home health agency       Expected Discharge Plan and Services                                                 Social Determinants of Health (SDOH) Interventions    Readmission Risk Interventions Readmission Risk Prevention Plan 10/21/2019  Transportation Screening Complete  Palliative Care Screening Not Applicable  Medication Review (RN Care Manager) Complete  Some recent data might be hidden

## 2020-04-28 NOTE — Consult Note (Signed)
WOC Nurse wound follow up Moisture associated skin damage to back and perineum.  0.2 cm scabbed lesion to upper left buttocks and stage 2 peeling epithelium has surrounding maroon discoloration that is more pronounced as the MASD is resolving.  Will implement additional topical therapy.  Wound type:pressure, moisture, friction and shearing.  Measurement:Left hip/buttocks  1 cm x 1 cm x x0.1 cm open area with 2 cm surrounding maroon discoloration.  Wound NET:UYWS and moist Drainage (amount, consistency, odor) minimal serosanguinous  No odor.  Periwound:MASD remains to sacrum and buttocks and back, but resolving.  Dressing procedure/placement/frequency:Continue barrier cream and add silicone foam to left buttock/hip area.   Low air loss bed now that she is on the medical floor.  Will not follow at this time.  Please re-consult if needed.  Maple Hudson MSN, RN, FNP-BC CWON Wound, Ostomy, Continence Nurse Pager [phone]

## 2020-04-28 NOTE — Progress Notes (Signed)
Applied barrier cream and silicone foam to left buttock/hip area.   Low air loss bed ordered.

## 2020-04-29 LAB — CULTURE, BLOOD (ROUTINE X 2)
Culture: NO GROWTH
Special Requests: ADEQUATE

## 2020-04-29 MED ORDER — RISAQUAD PO CAPS: 1.0000 | ORAL_CAPSULE | Freq: Every day | ORAL | 0 refills | Status: AC

## 2020-04-29 MED ORDER — HYDROCODONE-ACETAMINOPHEN 5-325 MG PO TABS: 1.0000 | ORAL_TABLET | Freq: Three times a day (TID) | ORAL | 0 refills | Status: DC | PRN

## 2020-04-29 NOTE — Plan of Care (Signed)

## 2020-04-29 NOTE — Discharge Summary (Signed)
[facility] - [place] at [facility]   PATIENT NAME: Regina White    MR#:  [id number]  DATE OF BIRTH:  [birth year]  DATE OF ADMISSION:  04/24/2020 ADMITTING PHYSICIAN: Lorretta HarpXilin Niu, MD  DATE OF DISCHARGE: 04/29/2020  PRIMARY CARE PHYSICIAN: Cain SieveKlipstein, Christopher, MD    ADMISSION DIAGNOSIS:  Sepsis secondary to UTI (HCC) [A41.9, N39.0] Acute on chronic respiratory failure with hypoxemia (HCC) [J96.21] Sepsis without acute organ dysfunction, due to unspecified organism (HCC) [A41.9] Sepsis (HCC) [A41.9]  DISCHARGE DIAGNOSIS:  Streptococcal septic shock POA--improving, source skin Chronic pain syndrome--has morphine pump  SECONDARY DIAGNOSIS:   Past Medical History:  Diagnosis Date  . Asthma   . CHF (congestive heart failure) (HCC)   . Chronic back pain   . COPD (chronic obstructive pulmonary disease) (HCC)   . Pulmonary hypertension [facility](HCC)     HOSPITAL COURSE:   Joesphine BareCathleen N Rensingis a 64 y.o.femalewith medical history significant ofCOPD on 3 L oxygen at home, diastolic CHF, pulmonary hypertension, asthma, chronic pain is syndrome, pulmonary hypertension, who came to the ER with significant pain weakness and was found to be septic. She has a wound on the left side of her buttocks. She was also short of breath.  *Septic shock due to Streptococcus due to skin breakdown with acute on chronic hypoxic respiratory failure in acute renal failure POA--improved now -source skin ulcers/breakdown/ decubitus -pt was on phenylephrine and vasopressin--weaned off -IV fluids--d/c  -blood culture positive for Streptococcus agalactaie  -IV Rocephin --change d to po Linezolid per ID rec -ID consultation appreciated  *Acute renal failure secondary to hypotension in the setting of septic shock -was on  IV fluids and IV pressors--now weaned off -Avoid nephrotoxic agents - creatinine normal -Good UOP  *Infected small decubitus with erythematous skin over hte  bittocks -seen by surgery no surgical intervention needed. 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 - Patient is bedbound for more than a year -she has morphine pump for many years due to her chronic back pain -complains of excruciating pain and unable to move without causing a lot of pain -palliative care consultation for goals of careappreciated -PRN pain meds as well as allow -add prn ultran and norco for pain -d/c IV fentanyl  *Incidental finding of cholelithiasis of CT scan -labs stable continue to monitor  *Dilated cardiomyopathy. [facility]. -Echo shows EF of 35%. - on bisoprolol and entersto--resumed both meds at home dose since BP stable -mild elevated BNP.  -pt sats stable -good uop -appreciated [facility] cards input  *Acute hypoxic respiratory failure secondary to COPD exacerbation and mild CHF systolic acute -PRN Lasix -inhalers/nebulizer -patient uses chronic oxygen 4 L at home--- post COVID infection in December 2020 -cardiac meds as above  *Hypothyroidism continue Synthroid   TOC for d/c planning-- patient wishes to go home with resumption of home health services-PT  Procedures:arterial line--removed Family communication : husband in the room Consults :cardiology. ICU, ID CODE STATUS: FULL DVT Prophylaxis :heparin  Status is: Inpatient  Dispo: The patient is from: Home  Anticipated d/c is to: Home with HH (per pt) today  Anticipated d/c date is: today]  Patient currently is  medically stable to d/c.   CONSULTS OBTAINED:  Treatment Team:  Judithe ModestKeene, Jeremiah D, NP Antonieta IbaGollan, Timothy J, MD Lynn Itoavishankar, Jayashree, MD  DRUG ALLERGIES:   Allergies  Allergen Reactions  .  Penicillins Anaphylaxis    Breathing problems Did it involve swelling of the  face/tongue/throat, SOB, or low BP? Yes Did it involve sudden or severe rash/hives, skin peeling, or any reaction on the inside of your mouth or nose? Unknown Did you need to seek medical attention at a hospital or doctor's office? Unknown When did it last happen?unknown If all above answers are "NO", may proceed with cephalosporin use. Patient tolerated Ceftriaxone September 2020  . Erythromycin Nausea And Vomiting and Rash    STOMACH PAIN  . Levofloxacin Other (See Comments)    Muscle aches  . Lidocaine Rash  . Clindamycin Other (See Comments)    Recurrent C diff  . Levothyroxine Other (See Comments)    Vaginal bleeding Reaction to generic (pt currently takes name brand Synthroid)  . Tiotropium Other (See Comments)    Coughing up blood  . Doxycycline Nausea And Vomiting  . Sulfa Antibiotics Rash    DISCHARGE MEDICATIONS:   Allergies as of 04/29/2020      Reactions   Penicillins Anaphylaxis   Breathing problems Did it involve swelling of the face/tongue/throat, SOB, or low BP? Yes Did it involve sudden or severe rash/hives, skin peeling, or any reaction on the inside of your mouth or nose? Unknown Did you need to seek medical attention at a hospital or doctor's office? Unknown When did it last happen?unknown If all above answers are "NO", may proceed with cephalosporin use. Patient tolerated Ceftriaxone September 2020   Erythromycin Nausea And Vomiting, Rash   STOMACH PAIN   Levofloxacin Other (See Comments)   Muscle aches   Lidocaine Rash   Clindamycin Other (See Comments)   Recurrent C diff   Levothyroxine Other (See Comments)   Vaginal bleeding Reaction to generic (pt currently takes name brand Synthroid)   Tiotropium Other (See Comments)   Coughing up blood   Doxycycline Nausea And Vomiting   Sulfa Antibiotics Rash      Medication List    TAKE these medications   acidophilus Caps capsule Take 1 capsule by mouth daily.   albuterol 108 (90 Base)  MCG/ACT inhaler Commonly known as: VENTOLIN HFA Inhale 2 puffs into the lungs every 6 (six) hours as needed for wheezing or shortness of breath.   apixaban 2.5 MG Tabs tablet Commonly known as: ELIQUIS Take 1 tablet (2.5 mg total) by mouth 2 (two) times daily.   bisoprolol 5 MG tablet Commonly known as: ZEBETA Take 5 mg by mouth daily.   Entresto 24-26 MG Generic drug: sacubitril-valsartan Take 1 tablet by mouth 2 (two) times daily.   famotidine 20 MG tablet Commonly known as: PEPCID Take 1 tablet (20 mg total) by mouth 2 (two) times daily. What changed: when to take this   Fluticasone-Salmeterol 500-50 MCG/DOSE Aepb Commonly known as: ADVAIR Inhale 1 puff into the lungs 2 (two) times daily.   guaiFENesin-dextromethorphan 100-10 MG/5ML syrup Commonly known as: ROBITUSSIN DM Take 5 mLs by mouth every 4 (four) hours as needed for cough.   HYDROcodone-acetaminophen 5-325 MG tablet Commonly known as: NORCO/VICODIN Take 1 tablet by mouth every 8 (eight) hours as needed for severe pain.   ipratropium 0.02 % nebulizer solution Commonly known as: ATROVENT Inhale into the lungs.   levothyroxine 200 MCG tablet Commonly known as: Synthroid Take 1 tablet (200 mcg total) by mouth daily at 6 (six) AM.   linezolid 600 MG tablet Commonly known as: Zyvox Take 1 tablet (600 mg total) by mouth 2 (two) times daily for 7 days.  montelukast 10 MG tablet Commonly known as: SINGULAIR Take 1 tablet (10 mg total) by mouth at bedtime.   NON FORMULARY by Intrathecal Infusion route continuous. MORPHINE / BUPIVACAINE INTRATHECAL PUMP   polyethylene glycol 17 g packet Commonly known as: MIRALAX / GLYCOLAX Take 17 g by mouth daily as needed (constipation).   traZODone 50 MG tablet Commonly known as: DESYREL Take 150 mg by mouth at bedtime.   vancomycin 125 MG capsule Commonly known as: VANCOCIN Take 1 capsule (125 mg total) by mouth 3 (three) times daily for 14 days.   Vitamin D 50  MCG (2000 UT) tablet Take 2,000 Units by mouth daily.            Discharge Care Instructions  (From admission, onward)         Start     Ordered   04/29/20 0000  Discharge wound care:       Comments: Dressing procedure/placement/frequency: Keep skin clean and dry and incontinence cleaned promptly.  Is on low air loss bed.  No disposable briefs or underpads..  Barrier cream twice daily and PRn soilage.  Turn and reposition every two hours.  Will not follow at this time.   04/29/20 0753          If you experience worsening of your admission symptoms, develop shortness of breath, life threatening emergency, suicidal or homicidal thoughts you must seek medical attention immediately by calling 911 or calling your MD immediately  if symptoms less severe.  You Must read complete instructions/literature along with all the possible adverse reactions/side effects for all the Medicines you take and that have been prescribed to you. Take any new Medicines after you have completely understood and accept all the possible adverse reactions/side effects.   Please note  You were cared for by a hospitalist during your hospital stay. If you have any questions about your discharge medications or the care you received while you were in the hospital after you are discharged, you can call the unit and asked to speak with the hospitalist on call if the hospitalist that took care of you is not available. Once you are discharged, your primary care physician will handle any further medical issues. Please note that NO REFILLS for any discharge medications will be authorized once you are discharged, as it is imperative that you return to your primary care physician (or establish a relationship with a primary care physician if you do not have one) for your aftercare needs so that they can reassess your need for medications and monitor your lab values. Today   SUBJECTIVE   Doing ok  VITAL SIGNS:  Blood  pressure 115/82, pulse 75, temperature (!) 97.5 F (36.4 C), temperature source Oral, resp. rate 16, height 5\' 5"  (1.651 m), weight 105.5 kg, SpO2 92 %.  I/O:    Intake/Output Summary (Last 24 hours) at 04/29/2020 0757 Last data filed at 04/28/2020 0900 Gross per 24 hour  Intake 120 ml  Output --  Net 120 ml    PHYSICAL EXAMINATION:   Physical Exam  GENERAL:  64 y.o.-year-old patient lying in the bed with no acute distress.morbid obesity  EYES: Pupils equal, round, reactive to light and accommodation. No scleral icterus.   HEENT: Head atraumatic, normocephalic. Oropharynx and nasopharynx clear.  NECK:  Supple, no jugular venous distention. No thyroid enlargement, no tenderness.  LUNGS: Normal breath sounds bilaterally, no wheezing, rales, rhonchi. No use of accessory muscles of respiration.  CARDIOVASCULAR: S1, S2 normal. No murmurs, rubs,  or gallops.  ABDOMEN: Soft, nontender, nondistended. Bowel sounds present. No organomegaly or mass.  EXTREMITIES: No cyanosis, clubbing  + edema b/l.    NEUROLOGIC: non focal PSYCHIATRIC:  patient is alert and oriented x3    DATA REVIEW:   CBC  Recent Labs  Lab 04/27/20 0440  WBC 7.6  HGB 11.8*  HCT 36.3  PLT 154    Chemistries  Recent Labs  Lab 04/27/20 0440  NA 139  K 3.6  CL 110  CO2 22  GLUCOSE 88  BUN 40*  CREATININE 0.85  CALCIUM 8.6*  MG 1.9  AST 10*  ALT 13  ALKPHOS 43  BILITOT 0.5    Microbiology Results   Recent Results (from the past 240 hour(s))  Culture, blood (Routine x 2)     Status: Abnormal   Collection Time: 04/24/20  1:30 PM   Specimen: BLOOD  Result Value Ref Range Status   Specimen Description   Final    BLOOD LEFT ANTECUBITAL Performed at [facility], [address] [place], [place] [address]    Special Requests   Final    BOTTLES DRAWN AEROBIC AND ANAEROBIC Blood Culture adequate volume Performed at [facility], [address], [place], [place] [address]     Culture  Setup Time   Final    GRAM POSITIVE COCCI IN BOTH AEROBIC AND ANAEROBIC BOTTLES CRITICAL RESULT CALLED TO, READ BACK BY AND VERIFIED WITH: Thomasene Ripple [facility] 1638 04/25/20 HNM Performed at [facility], [address] [place], [place] [address]    Culture GROUP B STREP(S.AGALACTIAE)ISOLATED (A)  Final   Report Status 04/27/2020 FINAL  Final   Organism ID, Bacteria GROUP B STREP(S.AGALACTIAE)ISOLATED  Final      Susceptibility   Group b strep(s.agalactiae)isolated - MIC*    CLINDAMYCIN <=0.25 SENSITIVE Sensitive     AMPICILLIN <=0.25 SENSITIVE Sensitive     ERYTHROMYCIN <=0.12 SENSITIVE Sensitive     VANCOMYCIN 0.5 SENSITIVE Sensitive     CEFTRIAXONE <=0.12 SENSITIVE Sensitive     LEVOFLOXACIN 0.5 SENSITIVE Sensitive     PENICILLIN Value in next row Sensitive      SENSITIVE<=0.06    * GROUP B STREP(S.AGALACTIAE)ISOLATED  Blood Culture ID Panel (Reflexed)     Status: Abnormal   Collection Time: 04/24/20  1:30 PM  Result Value Ref Range Status   Enterococcus species NOT DETECTED NOT DETECTED Final   Listeria monocytogenes NOT DETECTED NOT DETECTED Final   Staphylococcus species NOT DETECTED NOT DETECTED Final   Staphylococcus aureus (BCID) NOT DETECTED NOT DETECTED Final   Streptococcus species DETECTED (A) NOT DETECTED Final    Comment: CRITICAL RESULT CALLED TO, READ BACK BY AND VERIFIED WITH: DAVID BESANTI PHARMD 0342 04/25/20 HNM    Streptococcus agalactiae DETECTED (A) NOT DETECTED Final    Comment: CRITICAL RESULT CALLED TO, READ BACK BY AND VERIFIED WITH: DAVID BESNATI PHARMD 0342 04/25/20 HNM    Streptococcus pneumoniae NOT DETECTED NOT DETECTED Final   Streptococcus pyogenes NOT DETECTED NOT DETECTED Final   Acinetobacter baumannii NOT DETECTED NOT DETECTED Final   Enterobacteriaceae species NOT DETECTED NOT DETECTED Final   Enterobacter cloacae complex NOT DETECTED NOT DETECTED Final   Escherichia coli NOT DETECTED NOT DETECTED Final   Klebsiella oxytoca  NOT DETECTED NOT DETECTED Final   Klebsiella pneumoniae NOT DETECTED NOT DETECTED Final   Proteus species NOT DETECTED NOT DETECTED Final   Serratia marcescens NOT DETECTED NOT DETECTED Final   Haemophilus influenzae NOT DETECTED NOT  DETECTED Final   Neisseria meningitidis NOT DETECTED NOT DETECTED Final   Pseudomonas aeruginosa NOT DETECTED NOT DETECTED Final   Candida albicans NOT DETECTED NOT DETECTED Final   Candida glabrata NOT DETECTED NOT DETECTED Final   Candida krusei NOT DETECTED NOT DETECTED Final   Candida parapsilosis NOT DETECTED NOT DETECTED Final   Candida tropicalis NOT DETECTED NOT DETECTED Final    Comment: Performed at [facility], [address] [place], [place] [address]  Culture, blood (Routine x 2)     Status: None   Collection Time: 04/24/20  3:09 PM   Specimen: BLOOD  Result Value Ref Range Status   Specimen Description BLOOD BLOOD LEFT HAND  Final   Special Requests   Final    BOTTLES DRAWN AEROBIC ONLY Blood Culture adequate volume   Culture   Final    NO GROWTH 5 DAYS Performed at [facility], [address], [place], [place] [address]    Report Status 04/29/2020 FINAL  Final  SARS Coronavirus 2 by RT PCR (hospital order, performed in [facility] hospital lab) Nasopharyngeal Nasopharyngeal Swab     Status: None   Collection Time: 04/24/20  3:38 PM   Specimen: Nasopharyngeal Swab  Result Value Ref Range Status   SARS Coronavirus 2 NEGATIVE NEGATIVE Final    Comment: (NOTE) SARS-CoV-2 target nucleic acids are NOT DETECTED.  The SARS-CoV-2 RNA is generally detectable in upper and lower respiratory specimens during the acute phase of infection. The lowest concentration of SARS-CoV-2 viral copies this assay can detect is 250 copies / mL. A negative result does not preclude SARS-CoV-2 infection and should not be used as the sole basis for treatment or other patient management decisions.  A negative result may occur with improper  specimen collection / handling, submission of specimen other than nasopharyngeal swab, presence of viral mutation(s) within the areas targeted by this assay, and inadequate number of viral copies (<250 copies / mL). A negative result must be combined with clinical observations, patient history, and epidemiological information.  Fact Sheet for Patients:   BoilerBrush.com.cy  Fact Sheet for Healthcare Providers: https://pope.com/  This test is not yet approved or  cleared by the [place] FDA and has been authorized for detection and/or diagnosis of SARS-CoV-2 by FDA under an Emergency Use Authorization (EUA).  This EUA will remain in effect (meaning this test can be used) for the duration of the COVID-19 declaration under Section 564(b)(1) of the Act, 21 U.S.C. section 360bbb-3(b)(1), unless the authorization is terminated or revoked sooner.  Performed at [facility], [address], [place], [place] [address]   Urine culture     Status: Abnormal   Collection Time: 04/24/20  5:08 PM   Specimen: In/Out Cath Urine  Result Value Ref Range Status   Specimen Description   Final    IN/OUT CATH URINE Performed at [facility], [address] [place], [place] [address]    Special Requests   Final    NONE Performed at [facility], [address] [place], [place] [address]    Culture (A)  Final    40,000 COLONIES/mL MULTIPLE SPECIES PRESENT, SUGGEST RECOLLECTION   Report Status 04/25/2020 FINAL  Final  MRSA PCR Screening     Status: None   Collection Time: 04/24/20  8:27 PM   Specimen: Nasopharyngeal  Result Value Ref Range Status   MRSA by PCR NEGATIVE NEGATIVE Final    Comment:  The GeneXpert MRSA Assay (FDA approved for NASAL specimens only), is one component of a comprehensive MRSA colonization surveillance program. It is not intended to diagnose MRSA infection nor to guide  or monitor treatment for MRSA infections. Performed at [facility], [address] [place], [place] [address]     RADIOLOGY:  No results found.   CODE STATUS:     Code Status Orders  (From admission, onward)         Start     Ordered   04/24/20 2026  Full code  Continuous        04/24/20 2026        Code Status History    Date Active Date Inactive Code Status Order ID Comments User Context   10/19/2019 2110 10/26/2019 0045 Full Code [id number]  Pearson Grippe, MD ED   07/22/2019 1221 08/02/2019 2010 Full Code [id number]  Adrian Saran, MD ED   12/04/2018 1806 12/04/2018 1806 Full Code [id number]  Enedina Finner, MD ED   06/02/2018 1722 06/03/2018 0011 Full Code [id number]  Alford Highland, MD Inpatient   Advance Care Planning Activity       TOTAL TIME TAKING CARE OF THIS PATIENT: 40* minutes.    Enedina Finner M.D  [facility]    CC: Primary care physician; Cain Sieve, MD

## 2020-04-29 NOTE — TOC Transition Note (Signed)
Transition of Care [facility]) - CM/SW Discharge Note   Patient Details  Name: Regina White MRN: [id number] Date of Birth: 1956/04/02  Transition of Care [facility]) CM/SW Contact:  Shelbie Ammons, RN Phone Number: 04/29/2020, 2:06 PM   Clinical Narrative:   RNCM met with patient at bedside, she does not want to return home with previous agency. She feels that she would be better served by therapist she had previously, [employer] with [employer]. Patient reports that she lives at home with her husband and plans to return there. She is currently unable to walk and has to EMS transport to get to appointments. Her MD is Dr. Sharia Reeve at [facility] and she uses [facility]. She denies any issues with affording her medications. Patient again re-iterates that she wants to return to services with [facility] and is refusing need for [facility] nurse for wound care. RNCM placed call to [facility] to discuss case, after some discussion she reported that she should be able to accept patient back after she looks over clinicals. Clinicals faxed to [phone].           Patient Goals and CMS Choice        Discharge Placement                       Discharge Plan and Services                                     Social Determinants of Health (SDOH) Interventions     Readmission Risk Interventions Readmission Risk Prevention Plan 10/21/2019  Transportation Screening Complete  Palliative Care Screening Not Applicable  Medication Review (RN Care Manager) Complete  Some recent data might be hidden

## 2020-04-29 NOTE — Progress Notes (Signed)
Received MD order to discharge patient to home, reiewed discharge instructions, prescriptions and follow up appointments with patient and she verbalized understanding

## 2020-05-11 ENCOUNTER — Inpatient Hospital Stay
Admission: EM | Admit: 2020-05-11 | Discharge: 2020-05-19 | DRG: 291 | Disposition: A | Discharge status: Home Health | Payer: Medicare Other | Attending: Internal Medicine | Admitting: Internal Medicine

## 2020-05-11 DIAGNOSIS — R0602 Shortness of breath: Secondary | ICD-10-CM

## 2020-05-11 DIAGNOSIS — I110 Hypertensive heart disease with heart failure: Secondary | ICD-10-CM | POA: Diagnosis not present

## 2020-05-11 DIAGNOSIS — G8929 Other chronic pain: Secondary | ICD-10-CM | POA: Diagnosis present

## 2020-05-11 DIAGNOSIS — J441 Chronic obstructive pulmonary disease with (acute) exacerbation: Secondary | ICD-10-CM | POA: Diagnosis present

## 2020-05-11 DIAGNOSIS — B379 Candidiasis, unspecified: Secondary | ICD-10-CM | POA: Diagnosis present

## 2020-05-11 DIAGNOSIS — G4733 Obstructive sleep apnea (adult) (pediatric): Secondary | ICD-10-CM | POA: Diagnosis present

## 2020-05-11 DIAGNOSIS — Z882 Allergy status to sulfonamides status: Secondary | ICD-10-CM

## 2020-05-11 DIAGNOSIS — J9600 Acute respiratory failure, unspecified whether with hypoxia or hypercapnia: Secondary | ICD-10-CM | POA: Diagnosis not present

## 2020-05-11 DIAGNOSIS — R21 Rash and other nonspecific skin eruption: Secondary | ICD-10-CM | POA: Diagnosis present

## 2020-05-11 DIAGNOSIS — M25552 Pain in left hip: Secondary | ICD-10-CM

## 2020-05-11 DIAGNOSIS — X19XXXA Contact with other heat and hot substances, initial encounter: Secondary | ICD-10-CM

## 2020-05-11 DIAGNOSIS — E876 Hypokalemia: Secondary | ICD-10-CM | POA: Diagnosis not present

## 2020-05-11 DIAGNOSIS — Z79890 Hormone replacement therapy (postmenopausal): Secondary | ICD-10-CM

## 2020-05-11 DIAGNOSIS — R34 Anuria and oliguria: Secondary | ICD-10-CM | POA: Diagnosis present

## 2020-05-11 DIAGNOSIS — M879 Osteonecrosis, unspecified: Secondary | ICD-10-CM | POA: Diagnosis present

## 2020-05-11 DIAGNOSIS — I959 Hypotension, unspecified: Secondary | ICD-10-CM

## 2020-05-11 DIAGNOSIS — I4510 Unspecified right bundle-branch block: Secondary | ICD-10-CM | POA: Diagnosis present

## 2020-05-11 DIAGNOSIS — I272 Pulmonary hypertension, unspecified: Secondary | ICD-10-CM | POA: Diagnosis present

## 2020-05-11 DIAGNOSIS — Z20822 Contact with and (suspected) exposure to covid-19: Secondary | ICD-10-CM | POA: Diagnosis present

## 2020-05-11 DIAGNOSIS — R0902 Hypoxemia: Secondary | ICD-10-CM

## 2020-05-11 DIAGNOSIS — Z7951 Long term (current) use of inhaled steroids: Secondary | ICD-10-CM

## 2020-05-11 DIAGNOSIS — M79662 Pain in left lower leg: Secondary | ICD-10-CM

## 2020-05-11 DIAGNOSIS — R571 Hypovolemic shock: Secondary | ICD-10-CM | POA: Diagnosis present

## 2020-05-11 DIAGNOSIS — R570 Cardiogenic shock: Secondary | ICD-10-CM | POA: Diagnosis present

## 2020-05-11 DIAGNOSIS — J9621 Acute and chronic respiratory failure with hypoxia: Secondary | ICD-10-CM | POA: Diagnosis present

## 2020-05-11 DIAGNOSIS — F17200 Nicotine dependence, unspecified, uncomplicated: Secondary | ICD-10-CM | POA: Diagnosis present

## 2020-05-11 DIAGNOSIS — I509 Heart failure, unspecified: Secondary | ICD-10-CM

## 2020-05-11 DIAGNOSIS — Z8249 Family history of ischemic heart disease and other diseases of the circulatory system: Secondary | ICD-10-CM

## 2020-05-11 DIAGNOSIS — Z9981 Dependence on supplemental oxygen: Secondary | ICD-10-CM

## 2020-05-11 DIAGNOSIS — Z79899 Other long term (current) drug therapy: Secondary | ICD-10-CM

## 2020-05-11 DIAGNOSIS — L89152 Pressure ulcer of sacral region, stage 2: Secondary | ICD-10-CM | POA: Diagnosis present

## 2020-05-11 DIAGNOSIS — R339 Retention of urine, unspecified: Secondary | ICD-10-CM | POA: Diagnosis present

## 2020-05-11 DIAGNOSIS — N179 Acute kidney failure, unspecified: Secondary | ICD-10-CM | POA: Diagnosis present

## 2020-05-11 DIAGNOSIS — I82409 Acute embolism and thrombosis of unspecified deep veins of unspecified lower extremity: Secondary | ICD-10-CM

## 2020-05-11 DIAGNOSIS — M1610 Unilateral primary osteoarthritis, unspecified hip: Secondary | ICD-10-CM | POA: Diagnosis present

## 2020-05-11 DIAGNOSIS — Z8616 Personal history of COVID-19: Secondary | ICD-10-CM

## 2020-05-11 DIAGNOSIS — L89322 Pressure ulcer of left buttock, stage 2: Secondary | ICD-10-CM | POA: Diagnosis present

## 2020-05-11 DIAGNOSIS — Z888 Allergy status to other drugs, medicaments and biological substances status: Secondary | ICD-10-CM

## 2020-05-11 DIAGNOSIS — I82402 Acute embolism and thrombosis of unspecified deep veins of left lower extremity: Secondary | ICD-10-CM | POA: Diagnosis present

## 2020-05-11 DIAGNOSIS — A0472 Enterocolitis due to Clostridium difficile, not specified as recurrent: Secondary | ICD-10-CM | POA: Diagnosis present

## 2020-05-11 DIAGNOSIS — Z6841 Body Mass Index (BMI) 40.0 and over, adult: Secondary | ICD-10-CM

## 2020-05-11 DIAGNOSIS — I5023 Acute on chronic systolic (congestive) heart failure: Secondary | ICD-10-CM | POA: Diagnosis present

## 2020-05-11 DIAGNOSIS — Z880 Allergy status to penicillin: Secondary | ICD-10-CM

## 2020-05-11 DIAGNOSIS — L89521 Pressure ulcer of left ankle, stage 1: Secondary | ICD-10-CM | POA: Diagnosis present

## 2020-05-11 DIAGNOSIS — Z7901 Long term (current) use of anticoagulants: Secondary | ICD-10-CM

## 2020-05-11 DIAGNOSIS — I5082 Biventricular heart failure: Secondary | ICD-10-CM | POA: Diagnosis present

## 2020-05-11 DIAGNOSIS — I429 Cardiomyopathy, unspecified: Secondary | ICD-10-CM | POA: Diagnosis present

## 2020-05-11 DIAGNOSIS — F329 Major depressive disorder, single episode, unspecified: Secondary | ICD-10-CM | POA: Diagnosis present

## 2020-05-11 DIAGNOSIS — Z981 Arthrodesis status: Secondary | ICD-10-CM

## 2020-05-12 ENCOUNTER — Emergency Department: Payer: Medicare Other

## 2020-05-12 ENCOUNTER — Other Ambulatory Visit: Payer: Self-pay

## 2020-05-12 ENCOUNTER — Encounter: Payer: Self-pay | Admitting: *Deleted

## 2020-05-12 DIAGNOSIS — F17200 Nicotine dependence, unspecified, uncomplicated: Secondary | ICD-10-CM | POA: Diagnosis present

## 2020-05-12 DIAGNOSIS — I82402 Acute embolism and thrombosis of unspecified deep veins of left lower extremity: Secondary | ICD-10-CM | POA: Diagnosis present

## 2020-05-12 DIAGNOSIS — I272 Pulmonary hypertension, unspecified: Secondary | ICD-10-CM | POA: Diagnosis present

## 2020-05-12 DIAGNOSIS — I825Y2 Chronic embolism and thrombosis of unspecified deep veins of left proximal lower extremity: Secondary | ICD-10-CM | POA: Diagnosis not present

## 2020-05-12 DIAGNOSIS — R571 Hypovolemic shock: Secondary | ICD-10-CM | POA: Diagnosis present

## 2020-05-12 DIAGNOSIS — R339 Retention of urine, unspecified: Secondary | ICD-10-CM | POA: Diagnosis present

## 2020-05-12 DIAGNOSIS — A0472 Enterocolitis due to Clostridium difficile, not specified as recurrent: Secondary | ICD-10-CM | POA: Diagnosis present

## 2020-05-12 DIAGNOSIS — M25552 Pain in left hip: Secondary | ICD-10-CM | POA: Diagnosis not present

## 2020-05-12 DIAGNOSIS — I429 Cardiomyopathy, unspecified: Secondary | ICD-10-CM | POA: Diagnosis present

## 2020-05-12 DIAGNOSIS — E876 Hypokalemia: Secondary | ICD-10-CM | POA: Diagnosis not present

## 2020-05-12 DIAGNOSIS — Z515 Encounter for palliative care: Secondary | ICD-10-CM | POA: Diagnosis not present

## 2020-05-12 DIAGNOSIS — Z6841 Body Mass Index (BMI) 40.0 and over, adult: Secondary | ICD-10-CM | POA: Diagnosis not present

## 2020-05-12 DIAGNOSIS — I82503 Chronic embolism and thrombosis of unspecified deep veins of lower extremity, bilateral: Secondary | ICD-10-CM | POA: Diagnosis not present

## 2020-05-12 DIAGNOSIS — J9621 Acute and chronic respiratory failure with hypoxia: Secondary | ICD-10-CM

## 2020-05-12 DIAGNOSIS — N179 Acute kidney failure, unspecified: Secondary | ICD-10-CM | POA: Diagnosis present

## 2020-05-12 DIAGNOSIS — R34 Anuria and oliguria: Secondary | ICD-10-CM | POA: Diagnosis present

## 2020-05-12 DIAGNOSIS — I5082 Biventricular heart failure: Secondary | ICD-10-CM | POA: Diagnosis present

## 2020-05-12 DIAGNOSIS — M79662 Pain in left lower leg: Secondary | ICD-10-CM | POA: Diagnosis not present

## 2020-05-12 DIAGNOSIS — I5020 Unspecified systolic (congestive) heart failure: Secondary | ICD-10-CM | POA: Diagnosis not present

## 2020-05-12 DIAGNOSIS — M879 Osteonecrosis, unspecified: Secondary | ICD-10-CM | POA: Diagnosis present

## 2020-05-12 DIAGNOSIS — I5023 Acute on chronic systolic (congestive) heart failure: Secondary | ICD-10-CM | POA: Diagnosis present

## 2020-05-12 DIAGNOSIS — M1610 Unilateral primary osteoarthritis, unspecified hip: Secondary | ICD-10-CM | POA: Diagnosis present

## 2020-05-12 DIAGNOSIS — B379 Candidiasis, unspecified: Secondary | ICD-10-CM | POA: Diagnosis present

## 2020-05-12 DIAGNOSIS — I5021 Acute systolic (congestive) heart failure: Secondary | ICD-10-CM | POA: Diagnosis not present

## 2020-05-12 DIAGNOSIS — I420 Dilated cardiomyopathy: Secondary | ICD-10-CM | POA: Diagnosis not present

## 2020-05-12 DIAGNOSIS — I959 Hypotension, unspecified: Secondary | ICD-10-CM | POA: Diagnosis not present

## 2020-05-12 DIAGNOSIS — Z7189 Other specified counseling: Secondary | ICD-10-CM | POA: Diagnosis not present

## 2020-05-12 DIAGNOSIS — Z20822 Contact with and (suspected) exposure to covid-19: Secondary | ICD-10-CM | POA: Diagnosis present

## 2020-05-12 DIAGNOSIS — I950 Idiopathic hypotension: Secondary | ICD-10-CM | POA: Diagnosis not present

## 2020-05-12 DIAGNOSIS — R570 Cardiogenic shock: Secondary | ICD-10-CM | POA: Diagnosis present

## 2020-05-12 DIAGNOSIS — R0602 Shortness of breath: Secondary | ICD-10-CM | POA: Diagnosis not present

## 2020-05-12 DIAGNOSIS — J441 Chronic obstructive pulmonary disease with (acute) exacerbation: Secondary | ICD-10-CM | POA: Diagnosis present

## 2020-05-12 DIAGNOSIS — I5022 Chronic systolic (congestive) heart failure: Secondary | ICD-10-CM | POA: Diagnosis not present

## 2020-05-12 DIAGNOSIS — J9600 Acute respiratory failure, unspecified whether with hypoxia or hypercapnia: Secondary | ICD-10-CM | POA: Diagnosis present

## 2020-05-12 DIAGNOSIS — I4510 Unspecified right bundle-branch block: Secondary | ICD-10-CM | POA: Diagnosis present

## 2020-05-12 DIAGNOSIS — I110 Hypertensive heart disease with heart failure: Secondary | ICD-10-CM | POA: Diagnosis present

## 2020-05-12 DIAGNOSIS — R21 Rash and other nonspecific skin eruption: Secondary | ICD-10-CM | POA: Diagnosis present

## 2020-05-12 LAB — CBC
HCT: 41.8 % (ref 36.0–46.0)
Hemoglobin: 12.7 g/dL (ref 12.0–15.0)
MCH: 28.7 pg (ref 26.0–34.0)
MCHC: 30.4 g/dL (ref 30.0–36.0)
MCV: 94.4 fL (ref 80.0–100.0)
Platelets: 181 10*3/uL (ref 150–400)
RBC: 4.43 MIL/uL (ref 3.87–5.11)
RDW: 19.9 % — ABNORMAL HIGH (ref 11.5–15.5)
WBC: 8.2 10*3/uL (ref 4.0–10.5)
nRBC: 0.2 % (ref 0.0–0.2)

## 2020-05-12 LAB — CBC WITH DIFFERENTIAL/PLATELET
Abs Immature Granulocytes: 0.06 10*3/uL (ref 0.00–0.07)
Basophils Absolute: 0.1 10*3/uL (ref 0.0–0.1)
Basophils Relative: 1 %
Eosinophils Absolute: 0.3 10*3/uL (ref 0.0–0.5)
Eosinophils Relative: 5 %
HCT: 40.8 % (ref 36.0–46.0)
Hemoglobin: 12.2 g/dL (ref 12.0–15.0)
Immature Granulocytes: 1 %
Lymphocytes Relative: 14 %
Lymphs Abs: 1 10*3/uL (ref 0.7–4.0)
MCH: 28.7 pg (ref 26.0–34.0)
MCHC: 29.9 g/dL — ABNORMAL LOW (ref 30.0–36.0)
MCV: 96 fL (ref 80.0–100.0)
Monocytes Absolute: 0.5 10*3/uL (ref 0.1–1.0)
Monocytes Relative: 7 %
Neutro Abs: 4.8 10*3/uL (ref 1.7–7.7)
Neutrophils Relative %: 72 %
Platelets: 171 10*3/uL (ref 150–400)
RBC: 4.25 MIL/uL (ref 3.87–5.11)
RDW: 19.8 % — ABNORMAL HIGH (ref 11.5–15.5)
WBC: 6.7 10*3/uL (ref 4.0–10.5)
nRBC: 0.5 % — ABNORMAL HIGH (ref 0.0–0.2)

## 2020-05-12 LAB — URINALYSIS, COMPLETE (UACMP) WITH MICROSCOPIC
Bacteria, UA: NONE SEEN
Bilirubin Urine: NEGATIVE
Glucose, UA: NEGATIVE mg/dL
Hgb urine dipstick: NEGATIVE
Ketones, ur: NEGATIVE mg/dL
Leukocytes,Ua: NEGATIVE
Nitrite: NEGATIVE
Protein, ur: 30 mg/dL — AB
Specific Gravity, Urine: 1.021 (ref 1.005–1.030)
Squamous Epithelial / HPF: NONE SEEN (ref 0–5)
pH: 5 (ref 5.0–8.0)

## 2020-05-12 LAB — COMPREHENSIVE METABOLIC PANEL
ALT: 9 U/L (ref 0–44)
AST: 9 U/L — ABNORMAL LOW (ref 15–41)
Albumin: 2.6 g/dL — ABNORMAL LOW (ref 3.5–5.0)
Alkaline Phosphatase: 47 U/L (ref 38–126)
Anion gap: 9 (ref 5–15)
BUN: 37 mg/dL — ABNORMAL HIGH (ref 8–23)
CO2: 23 mmol/L (ref 22–32)
Calcium: 7.8 mg/dL — ABNORMAL LOW (ref 8.9–10.3)
Chloride: 111 mmol/L (ref 98–111)
Creatinine, Ser: 1.23 mg/dL — ABNORMAL HIGH (ref 0.44–1.00)
GFR calc Af Amer: 53 mL/min — ABNORMAL LOW (ref >60)
GFR calc non Af Amer: 46 mL/min — ABNORMAL LOW (ref >60)
Glucose, Bld: 101 mg/dL — ABNORMAL HIGH (ref 70–99)
Potassium: 4.6 mmol/L (ref 3.5–5.1)
Sodium: 143 mmol/L (ref 135–145)
Total Bilirubin: 0.5 mg/dL (ref 0.3–1.2)
Total Protein: 5.5 g/dL — ABNORMAL LOW (ref 6.5–8.1)

## 2020-05-12 LAB — BLOOD GAS, ARTERIAL
Acid-base deficit: 3.4 mmol/L — ABNORMAL HIGH (ref 0.0–2.0)
Bicarbonate: 23.6 mmol/L (ref 20.0–28.0)
FIO2: 1
O2 Saturation: 81.8 %
Patient temperature: 37
pCO2 arterial: 49 mmHg — ABNORMAL HIGH (ref 32.0–48.0)
pH, Arterial: 7.29 — ABNORMAL LOW (ref 7.350–7.450)
pO2, Arterial: 52 mmHg — ABNORMAL LOW (ref 83.0–108.0)

## 2020-05-12 LAB — PROCALCITONIN: Procalcitonin: 0.1 ng/mL

## 2020-05-12 LAB — SARS CORONAVIRUS 2 BY RT PCR (HOSPITAL ORDER, PERFORMED IN ~~LOC~~ HOSPITAL LAB): SARS Coronavirus 2: NEGATIVE

## 2020-05-12 LAB — CORTISOL: Cortisol, Plasma: 15.9 ug/dL

## 2020-05-12 LAB — TROPONIN I (HIGH SENSITIVITY): Troponin I (High Sensitivity): 10 ng/L (ref <18)

## 2020-05-12 LAB — GLUCOSE, CAPILLARY: Glucose-Capillary: 100 mg/dL — ABNORMAL HIGH (ref 70–99)

## 2020-05-12 LAB — TSH: TSH: 1.092 u[IU]/mL (ref 0.350–4.500)

## 2020-05-12 LAB — CREATININE, SERUM
Creatinine, Ser: 1.23 mg/dL — ABNORMAL HIGH (ref 0.44–1.00)
GFR calc Af Amer: 53 mL/min — ABNORMAL LOW (ref >60)
GFR calc non Af Amer: 46 mL/min — ABNORMAL LOW (ref >60)

## 2020-05-12 LAB — BRAIN NATRIURETIC PEPTIDE: B Natriuretic Peptide: 564.1 pg/mL — ABNORMAL HIGH (ref 0.0–100.0)

## 2020-05-12 LAB — LACTIC ACID, PLASMA: Lactic Acid, Venous: 1.3 mmol/L (ref 0.5–1.9)

## 2020-05-12 MED ORDER — NOREPINEPHRINE 4 MG/250ML-% IV SOLN
INTRAVENOUS | Status: AC
  Administered 2020-05-12: 2 ug/min via INTRAVENOUS
  Filled 2020-05-12: qty 250

## 2020-05-12 MED ORDER — SODIUM CHLORIDE 0.9 % IV BOLUS
500.0000 mL | Freq: Once | INTRAVENOUS | Status: AC
  Administered 2020-05-12: 500 mL via INTRAVENOUS

## 2020-05-12 MED ORDER — ACETAMINOPHEN 325 MG PO TABS
650.0000 mg | ORAL_TABLET | ORAL | Status: DC | PRN
  Administered 2020-05-12: 650 mg via ORAL
  Filled 2020-05-12: qty 2

## 2020-05-12 MED ORDER — BUDESONIDE 0.25 MG/2ML IN SUSP
0.5000 mg | Freq: Two times a day (BID) | RESPIRATORY_TRACT | Status: DC
  Administered 2020-05-12 – 2020-05-19 (×15): 0.5 mg via RESPIRATORY_TRACT
  Filled 2020-05-12 (×15): qty 4

## 2020-05-12 MED ORDER — HYDROCORTISONE NA SUCCINATE PF 100 MG IJ SOLR
50.0000 mg | Freq: Four times a day (QID) | INTRAMUSCULAR | Status: AC
  Administered 2020-05-12 – 2020-05-14 (×8): 50 mg via INTRAVENOUS
  Filled 2020-05-12 (×9): qty 2

## 2020-05-12 MED ORDER — IOHEXOL 350 MG/ML SOLN
75.0000 mL | Freq: Once | INTRAVENOUS | Status: AC | PRN
  Administered 2020-05-12: 75 mL via INTRAVENOUS

## 2020-05-12 MED ORDER — LEVOTHYROXINE SODIUM 100 MCG PO TABS
200.0000 ug | ORAL_TABLET | Freq: Every day | ORAL | Status: DC
  Administered 2020-05-13 – 2020-05-19 (×7): 200 ug via ORAL
  Filled 2020-05-12 (×8): qty 2
  Filled 2020-05-12: qty 1
  Filled 2020-05-12: qty 2
  Filled 2020-05-12 (×2): qty 1

## 2020-05-12 MED ORDER — GUAIFENESIN-DM 100-10 MG/5ML PO SYRP
5.0000 mL | ORAL_SOLUTION | ORAL | Status: DC | PRN
  Filled 2020-05-12: qty 5

## 2020-05-12 MED ORDER — HYDROCODONE-ACETAMINOPHEN 5-325 MG PO TABS
1.0000 | ORAL_TABLET | Freq: Four times a day (QID) | ORAL | Status: DC | PRN
  Administered 2020-05-12 – 2020-05-13 (×3): 1 via ORAL
  Filled 2020-05-12 (×3): qty 1

## 2020-05-12 MED ORDER — SODIUM CHLORIDE 0.9% FLUSH
10.0000 mL | Freq: Two times a day (BID) | INTRAVENOUS | Status: DC
  Administered 2020-05-12 – 2020-05-14 (×6): 10 mL
  Administered 2020-05-15: 20 mL
  Administered 2020-05-15 – 2020-05-16 (×2): 10 mL
  Administered 2020-05-16: 20 mL
  Administered 2020-05-17 – 2020-05-19 (×5): 10 mL

## 2020-05-12 MED ORDER — HYDROCODONE-ACETAMINOPHEN 5-325 MG PO TABS: 1.0000 | ORAL_TABLET | Freq: Three times a day (TID) | ORAL | Status: DC | PRN

## 2020-05-12 MED ORDER — DOCUSATE SODIUM 100 MG PO CAPS: 100.0000 mg | ORAL_CAPSULE | Freq: Two times a day (BID) | ORAL | Status: DC | PRN

## 2020-05-12 MED ORDER — VITAMIN D3 25 MCG (1000 UNIT) PO TABS
2000.0000 [IU] | ORAL_TABLET | Freq: Every day | ORAL | Status: DC
  Administered 2020-05-12 – 2020-05-19 (×8): 2000 [IU] via ORAL
  Filled 2020-05-12 (×15): qty 2

## 2020-05-12 MED ORDER — SILVER SULFADIAZINE 1 % EX CREA
TOPICAL_CREAM | Freq: Two times a day (BID) | CUTANEOUS | Status: DC
  Administered 2020-05-13 (×2): 1 via TOPICAL
  Filled 2020-05-12 (×3): qty 85

## 2020-05-12 MED ORDER — SODIUM CHLORIDE 0.9% FLUSH: 10.0000 mL | INTRAVENOUS | Status: DC | PRN

## 2020-05-12 MED ORDER — MONTELUKAST SODIUM 10 MG PO TABS
10.0000 mg | ORAL_TABLET | Freq: Every day | ORAL | Status: DC
  Administered 2020-05-12 – 2020-05-18 (×7): 10 mg via ORAL
  Filled 2020-05-12 (×7): qty 1

## 2020-05-12 MED ORDER — CHLORHEXIDINE GLUCONATE CLOTH 2 % EX PADS
6.0000 | MEDICATED_PAD | Freq: Every day | CUTANEOUS | Status: DC
  Administered 2020-05-12 – 2020-05-19 (×9): 6 via TOPICAL

## 2020-05-12 MED ORDER — HYDROCODONE-ACETAMINOPHEN 5-325 MG PO TABS
2.0000 | ORAL_TABLET | Freq: Once | ORAL | Status: AC
  Administered 2020-05-12: 2 via ORAL
  Filled 2020-05-12: qty 2

## 2020-05-12 MED ORDER — NOREPINEPHRINE 4 MG/250ML-% IV SOLN: 2.0000 ug/min | INTRAVENOUS | Status: DC

## 2020-05-12 MED ORDER — TRAZODONE HCL 50 MG PO TABS
150.0000 mg | ORAL_TABLET | Freq: Every day | ORAL | Status: DC
  Administered 2020-05-12 – 2020-05-18 (×7): 150 mg via ORAL
  Filled 2020-05-12 (×8): qty 3

## 2020-05-12 MED ORDER — APIXABAN 2.5 MG PO TABS
2.5000 mg | ORAL_TABLET | Freq: Two times a day (BID) | ORAL | Status: DC
  Administered 2020-05-12 – 2020-05-19 (×15): 2.5 mg via ORAL
  Filled 2020-05-12 (×15): qty 1

## 2020-05-12 MED ORDER — VANCOMYCIN HCL 125 MG PO CAPS
125.0000 mg | ORAL_CAPSULE | Freq: Three times a day (TID) | ORAL | Status: DC
  Filled 2020-05-12 (×3): qty 1

## 2020-05-12 MED ORDER — IPRATROPIUM-ALBUTEROL 0.5-2.5 (3) MG/3ML IN SOLN
3.0000 mL | Freq: Four times a day (QID) | RESPIRATORY_TRACT | Status: DC
  Administered 2020-05-12 – 2020-05-17 (×21): 3 mL via RESPIRATORY_TRACT
  Filled 2020-05-12 (×23): qty 3

## 2020-05-12 MED ORDER — GERHARDT'S BUTT CREAM
TOPICAL_CREAM | Freq: Three times a day (TID) | CUTANEOUS | Status: DC
  Administered 2020-05-13 – 2020-05-18 (×2): 1 via TOPICAL
  Filled 2020-05-12 (×3): qty 1

## 2020-05-12 MED ORDER — MIDODRINE HCL 5 MG PO TABS
10.0000 mg | ORAL_TABLET | Freq: Three times a day (TID) | ORAL | Status: DC
  Administered 2020-05-12 – 2020-05-16 (×13): 10 mg via ORAL
  Filled 2020-05-12 (×13): qty 2

## 2020-05-12 MED ORDER — SODIUM CHLORIDE 0.9 % IV SOLN: 250.0000 mL | INTRAVENOUS | Status: DC

## 2020-05-12 MED ORDER — VANCOMYCIN HCL 125 MG PO CAPS
125.0000 mg | ORAL_CAPSULE | Freq: Three times a day (TID) | ORAL | Status: DC
  Administered 2020-05-12 – 2020-05-14 (×8): 125 mg via ORAL
  Filled 2020-05-12 (×16): qty 1

## 2020-05-12 MED ORDER — RISAQUAD PO CAPS
1.0000 | ORAL_CAPSULE | Freq: Every day | ORAL | Status: DC
  Administered 2020-05-12 – 2020-05-19 (×8): 1 via ORAL
  Filled 2020-05-12 (×8): qty 1

## 2020-05-12 MED ORDER — POLYETHYLENE GLYCOL 3350 17 G PO PACK: 17.0000 g | PACK | Freq: Every day | ORAL | Status: DC | PRN

## 2020-05-12 MED ORDER — HEPARIN SODIUM (PORCINE) 5000 UNIT/ML IJ SOLN: 5000.0000 [IU] | Freq: Three times a day (TID) | INTRAMUSCULAR | Status: DC

## 2020-05-12 MED ORDER — SODIUM CHLORIDE 0.9% FLUSH
3.0000 mL | Freq: Once | INTRAVENOUS | Status: AC
  Administered 2020-05-12: 3 mL via INTRAVENOUS

## 2020-05-12 MED ORDER — BISOPROLOL FUMARATE 5 MG PO TABS: 5.0000 mg | ORAL_TABLET | Freq: Every day | ORAL | Status: DC

## 2020-05-12 MED ORDER — VANCOMYCIN 50 MG/ML ORAL SOLUTION
125.0000 mg | Freq: Three times a day (TID) | ORAL | Status: DC
  Filled 2020-05-12 (×2): qty 2.5

## 2020-05-12 MED ORDER — PANTOPRAZOLE SODIUM 40 MG IV SOLR
40.0000 mg | Freq: Every day | INTRAVENOUS | Status: DC
  Administered 2020-05-12 – 2020-05-14 (×3): 40 mg via INTRAVENOUS
  Filled 2020-05-12 (×3): qty 40

## 2020-05-12 NOTE — Progress Notes (Signed)
Pt A&OX4, family at bedside. PRN Norco for pain. Back is excoriated and has serosanguinous drainage. Multiple small wound openings/sores with drainage to buttocks and back, cleansed. Levophed titrated per order. Per MD oxygen saturation goal greater than or equal to 85%. Foley for urine retention intact. Will continue to monitor.

## 2020-05-12 NOTE — ED Notes (Signed)
This RN attempted IV access for CT without success. IV team consult placed.

## 2020-05-12 NOTE — H&P (Signed)
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    HPI: This is a 64 y/o female with a complex medical history who was recently hospitalized for Streptococcus agalactiae bacteremia from UTI and cellulitis of the posterior thigh, back and buttocks, acute on chronic hypoxic respiratory failure, acute on chronic heart failure and severe septic shock.  PCCM is being called to see patient for acute on chronic hypoxic respiratory failure and shock.  Per EMS records, EMS was called because patient had difficulty urinating.  When EMS arrived, patient was hypoxic with SPO2 of 68% on 4 L.  She was placed on 10 L and transferred to the ED.  Upon arrival in the ED, patient was hypotensive with a blood pressure of 83/61.  Her SPO2 had improved to 88% on 12 L nasal cannula.  She was placed on a nonrebreather with some improvement in her oxygenation.  A Foley catheter was inserted and currently draining clear urine.  She was also noted to have an extensive erythematous rash that extends from her mid to lower back and buttocks. Her ED work-up was unremarkable except for severe cardiomegaly with severe right ventricular dilatation and dilatation of the pulmonary trunk on CTA chest.  She remained severely hypoxic and hypotensive hands she is being admitted to the ICU for further management. 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 She is awake and denies any chest pain, palpitations, dyspnea, nausea, vomiting and headache.  She reports taking all her medications at  home as prescribed.   SIGNIFICANT EVENTS: 04/29/2020: Discharged following hospitalization for septic shock, acute hypoxic respiratory failure and severe sepsis secondary to cellulitis and UTI 05/12/2019: Readmitted with hypotension and acute on chronic hypoxic respiratory failure   Past Medical History:  Diagnosis Date  . Asthma   . CHF (congestive heart failure) (HCC)   . Chronic back pain   . COPD (chronic obstructive pulmonary disease) (HCC)   . Pulmonary hypertension (HCC)    Past Surgical History:  Procedure Laterality Date  . BACK SURGERY     No current facility-administered medications on file prior to encounter.   Current Outpatient Medications on File Prior to Encounter  Medication Sig  . acidophilus (RISAQUAD) CAPS capsule Take 1 capsule by mouth daily.  Marland Kitchen. albuterol (VENTOLIN HFA) 108 (90 Base) MCG/ACT inhaler Inhale 2 puffs into the lungs every 6 (six) hours as needed for wheezing or shortness of breath.  Marland Kitchen. apixaban (ELIQUIS) 2.5 MG TABS tablet Take 1 tablet (2.5 mg total) by mouth 2 (two) times daily.  . bisoprolol (ZEBETA) 5 MG tablet Take 5 mg by mouth daily.  . Cholecalciferol (VITAMIN D) 50 MCG (2000 UT) tablet Take 2,000 Units by mouth daily.  Marland Kitchen. ENTRESTO 24-26 MG Take 1 tablet by mouth 2 (two) times daily.  . famotidine (PEPCID) 20 MG tablet Take 1 tablet (20 mg total) by mouth 2 (two) times daily. (Patient taking differently: Take 20 mg by mouth daily. )  . Fluticasone-Salmeterol (ADVAIR) 500-50 MCG/DOSE AEPB Inhale 1 puff into the lungs 2 (two) times daily.  Marland Kitchen. guaiFENesin-dextromethorphan (ROBITUSSIN DM) 100-10 MG/5ML syrup Take 5 mLs  by mouth every 4 (four) hours as needed for cough.  Marland Kitchen HYDROcodone-acetaminophen (NORCO/VICODIN) 5-325 MG tablet Take 1 tablet by mouth every 8 (eight) hours as needed for severe pain.  Marland Kitchen ipratropium (ATROVENT) 0.02 % nebulizer solution Inhale into the lungs.  Marland Kitchen levothyroxine (SYNTHROID) 200 MCG tablet Take 1 tablet (200 mcg total) by  mouth daily at 6 (six) AM.  . montelukast (SINGULAIR) 10 MG tablet Take 1 tablet (10 mg total) by mouth at bedtime.  . NON FORMULARY by Intrathecal Infusion route continuous. MORPHINE / BUPIVACAINE INTRATHECAL PUMP   . polyethylene glycol (MIRALAX / GLYCOLAX) 17 g packet Take 17 g by mouth daily as needed (constipation).  . traZODone (DESYREL) 50 MG tablet Take 150 mg by mouth at bedtime.   . vancomycin (VANCOCIN) 125 MG capsule Take 1 capsule (125 mg total) by mouth 3 (three) times daily for 14 days.    Allergies Allergies  Allergen Reactions  . Penicillins Anaphylaxis    Breathing problems Did it involve swelling of the face/tongue/throat, SOB, or low BP? Yes Did it involve sudden or severe rash/hives, skin peeling, or any reaction on the inside of your mouth or nose? Unknown Did you need to seek medical attention at a hospital or doctor's office? Unknown When did it last happen?unknown If all above answers are "NO", may proceed with cephalosporin use. Patient tolerated Ceftriaxone September 2020  . Erythromycin Nausea And Vomiting and Rash    STOMACH PAIN  . Levofloxacin Other (See Comments)    Muscle aches  . Lidocaine Rash  . Clindamycin Other (See Comments)    Recurrent C diff  . Levothyroxine Other (See Comments)    Vaginal bleeding Reaction to generic (pt currently takes name brand Synthroid)  . Tiotropium Other (See Comments)    Coughing up blood  . Doxycycline Nausea And Vomiting  . Sulfa Antibiotics Rash    Family History Family History  Problem Relation Age of Onset  . Hypertension Mother   . Liver cancer Father    Social History  reports that she quit smoking about 20 years ago. Her smoking use included cigarettes. She has a 30.00 pack-year smoking history. She has never used smokeless tobacco. She reports that she does not drink alcohol and does not use drugs.  Review Of Systems:   Constitutional: Negative for fever and chills but reports a feeling of  generalized malaise HENT: Negative for congestion and rhinorrhea.  Eyes: Negative for redness and visual disturbance.  Respiratory: Negative for shortness of breath and wheezing but positive for a wet cough.  Cardiovascular: Negative for chest pain and palpitations positive for left lower extremity edema Gastrointestinal: Negative  for nausea , vomiting and abdominal pain and  Loose stools Genitourinary: Negative for dysuria and urgency.  Endocrine: Denies polyuria, polyphagia and heat intolerance Musculoskeletal: Negative for myalgias and arthralgias.  Skin: Negative for pallor and wound.  Neurological: Negative for dizziness and headaches   VITAL SIGNS: BP (!) 94/55   Pulse 76   Temp 98.1 F (36.7 C) (Bladder)   Resp (!) 24   Wt 111.1 kg   LMP  (LMP Unknown)   SpO2 90%   BMI 40.77 kg/m   HEMODYNAMICS:    VENTILATOR SETTINGS:    INTAKE / OUTPUT: No intake/output data recorded.  PHYSICAL EXAMINATION: General: Obese, chronically ill looking HEENT: Normocephalic and atraumatic, PERRLA, trachea midline, mild JVD Neuro: Alert and oriented x3, moves all extremities, no focal deficits Cardiovascular: Apical pulse regular, S1-S2, +3 edema LLE, +1 edema  in RLE, no MRG Lungs: Normal WOB, bilateral breath sounds, no wheezes or rhonchi Abdomen: normal BS X4, no organomegaly  Musculoskeletal:  Limited ROM Skin: extensive weeping erythematous rash that extends from the mid upper back to the buttocks and posterior thigh; left >>>right.   LABS:  BMET Recent Labs  Lab 05/12/20 0136  NA 143  K 4.6  CL 111  CO2 23  BUN 37*  CREATININE 1.23*  GLUCOSE 101*    Electrolytes Recent Labs  Lab 05/12/20 0136  CALCIUM 7.8*    CBC Recent Labs  Lab 05/12/20 0056  WBC 6.7  HGB 12.2  HCT 40.8  PLT 171    Coag's No results for input(s): APTT, INR in the last 168 hours.  Sepsis Markers Recent Labs  Lab 05/12/20 0056 05/12/20 0136  LATICACIDVEN 1.3  --    PROCALCITON  --  0.10    ABG No results for input(s): PHART, PCO2ART, PO2ART in the last 168 hours.  Liver Enzymes Recent Labs  Lab 05/12/20 0136  AST 9*  ALT 9  ALKPHOS 47  BILITOT 0.5  ALBUMIN 2.6*    Cardiac Enzymes No results for input(s): TROPONINI, PROBNP in the last 168 hours.  Glucose No results for input(s): GLUCAP in the last 168 hours.  Imaging CT Angio Chest PE W/Cm &/Or Wo Cm  Result Date: 05/12/2020 CLINICAL DATA:  64 year old female with history of recent hospital admission for urosepsis. Urinary retention. Low oxygen saturation despite being on 4 L of oxygen. Evaluate for pulmonary embolism. EXAM: CT ANGIOGRAPHY CHEST WITH CONTRAST TECHNIQUE: Multidetector CT imaging of the chest was performed using the standard protocol during bolus administration of intravenous contrast. Multiplanar CT image reconstructions and MIPs were obtained to evaluate the vascular anatomy. CONTRAST:  70mL OMNIPAQUE IOHEXOL 350 MG/ML SOLN COMPARISON:  Chest CT 04/24/2020. FINDINGS: Cardiovascular: No filling defects within the pulmonary arterial tree to suggest pulmonary embolism. Severe dilatation of the pulmonic trunk (4.8 cm in diameter). Heart size is enlarged with severe right ventricular dilatation. There is no significant pericardial fluid, thickening or pericardial calcification. There is aortic atherosclerosis, as well as atherosclerosis of the great vessels of the mediastinum and the coronary arteries, including calcified atherosclerotic plaque in the left anterior descending coronary artery. Mediastinum/Nodes: No pathologically enlarged mediastinal or hilar lymph nodes. Esophagus is unremarkable in appearance. No axillary lymphadenopathy. Lungs/Pleura: No acute consolidative airspace disease. No pleural effusions. No suspicious appearing pulmonary nodules or masses are noted. Mild diffuse bronchial wall thickening with mild centrilobular emphysema. Upper Abdomen: Unremarkable.  Musculoskeletal: There are no aggressive appearing lytic or blastic lesions noted in the visualized portions of the skeleton. Review of the MIP images confirms the above findings. IMPRESSION: 1. No evidence of pulmonary embolism. 2. Cardiomegaly with severe right ventricular dilatation and severe dilatation of the pulmonic trunk. Findings are again suggestive of pulmonary arterial hypertension, likely with chronic right heart strain. 3. Mild diffuse bronchial wall thickening with mild centrilobular emphysema; imaging findings suggestive of underlying COPD. 4. Aortic atherosclerosis, in addition to left anterior descending coronary artery disease. Please note that although the presence of coronary artery calcium documents the presence of coronary artery disease, the severity of this disease and any potential stenosis cannot be assessed on this non-gated CT examination. Assessment for potential risk factor modification, dietary therapy or pharmacologic therapy may be warranted, if clinically indicated. Aortic Atherosclerosis (ICD10-I70.0) and Emphysema (ICD10-J43.9). Electronically Signed   By: Trudie Reed M.D.   On: 05/12/2020 04:30   DG Chest [facility]  1 View  Result Date: 05/12/2020 CLINICAL DATA:  History of urinary retention and previous urosepsis EXAM: PORTABLE CHEST 1 VIEW COMPARISON:  04/25/2020 FINDINGS: Cardiac shadow remains enlarged. Overall inspiratory effort is poor. Crowding of the vascular markings is noted. No new focal infiltrate is seen. No bony abnormality is noted. IMPRESSION: Poor inspiratory effort with crowding of the vascular markings. The overall appearance is similar to that seen on the prior exam. No new infiltrate is noted. Electronically Signed   By: Alcide Clever M.D.   On: 05/12/2020 01:10    STUDIES:  2D echo from 04/25/2020 shows left ventricular ejection fraction of 35 to 40%, decreased right ventricular systolic function, dilated right ventricle, severely elevated pulmonary  artery pressure, and tricuspid valve regurgitation  CULTURES: None; Streptococcus agalactiae 04/24/2020  ANTIBIOTICS: Oral vancomycin   LINES/TUBES: Peripheral IVs Foley catheter  DISCUSSION: 64 year old female presenting with acute on chronic respiratory failure secondary to acute COPD exacerbation and severe pulmonary hypertension requiring BiPAP, cardiogenic shock from severe cardiomyopathy with diastolic and systolic heart failure and severe erythematous rash likely due to humidity and immobility.  Overall prognosis is poor.  ASSESSMENT / PLAN:  PULMONARY A: Acute on chronic hypoxic respiratory failure on oxygen at 4 L nasal cannula at home Severe pulmonary hypertension Acute on chronic COPD exacerbation P:   Respiratory support with BiPAP, or high flow nasal cannula or nonrebreather mask and titrate to home dose of 4 L as tolerated Patient is at high risk for intubation and is currently a full code Nebulized bronchodilators and inhaled steroids ABGs as needed Transitional care consult as patient will require more skilled nursing than can be provided at home  CARDIOVASCULAR A:  Cardiogenic shock due to a combination of decompensated heart failure and severe pulmonary hypertension  Chronic systolic and diastolic heart failure-last ejection fraction was 35 to 40% P:  Norepinephrine infusion and titrate to maintain mean arterial blood pressure greater than 65 Cardiology consult Palliative care consult Resume home blood pressure medications once blood pressure improves Cycle cardiac enzymes  RENAL A:   Acute kidney injury P:   Trend creatinine Avoid nephrotoxic drugs Gentle IV hydration  GASTROINTESTINAL A:   C. difficile colitis P:   Continue oral vancomycin  HEMATOLOGIC A:   Left lower extremity DVT-CTA negative for PE P:  Continue Eliquis  INFECTIOUS A:   Extensive cellulitis of the upper back buttocks and left thigh-no signs of acute infection,  procalcitonin negative and lactic acid 1.3 P:   Monitor fever curve and trend procalcitonin and lactic acid levels  Integumentary A:   Extensive erythematous rash P:   Wound care consult Surgery reconsulted to evaluate for debridement Moisture control   Best Practice: Code Status: Full code Diet: Heart healthy GI prophylaxis: Protonix VTE prophylaxis: Already on apixaban  FAMILY  - Updates: No family at bedside.  Would update when available  Plan of care discussed with Dr. Jayme Cloud and Dr. Karna Christmas updated on current treatment plan  Baelyn Doring S. [facility] ([facility] ANP-BC Pulmonary and Critical Care Medicine [facility] Pager [phone] or [phone]  NB: This document was prepared using Dragon voice recognition software and may include unintentional dictation errors.    05/12/2020, 6:23 AM

## 2020-05-12 NOTE — ED Notes (Signed)
Severe pain and weakness in left hip. Pt has appointment with surgery for hip replacement consult. Pt has not been able to stand for over a month. Pt has PT at home but reports they have only been able to get to sitting on the edge of the bed. Last appointment was on Thursday. Pt is tearful and screaming with any movement in the ED.

## 2020-05-12 NOTE — ED Provider Notes (Addendum)
[facility] [place] Emergency Department Provider Note  ____________________________________________   First MD Initiated Contact with Patient 05/12/20 0013     (approximate)  I have reviewed the triage vital signs and the nursing notes.   HISTORY  Chief Complaint Urinary Retention    HPI Regina White is a 64 y.o. female with an extremely complicated medical history that includes chronic respiratory failure on 4 L of oxygen at baseline with a normal SPO2 of 88 to 90% on the 4 L, which apparently occurred after being diagnosed with COVID-19 in December 2020.  She also suffers from morbid obesity, sacral decubitus ulcer, COPD, and she was recently admitted for urosepsis requiring peripheral pressors.  She just finished antibiotics for the urinary tract infection  but she is on prophylactic oral vancomycin because she has had C. 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  She presents tonight by EMS for evaluation of urinary retention.  She says it has been more than 24 hours since she has urinated.  She feels some pressure and distention in her lower abdomen but no pain.  She denies fever/chills, sore throat, chest pain, nausea, vomiting, and abdominal pain.  She always feels short of breath and does not feel it is worse than usual, but her SPO2 on her chronic 4 L was 65% at home.  She is currently on 10 L on a nonrebreather and is satting 86 to 88% with a good waveform.  Additionally she says that her blood pressure is usually around 110 systolic and tonight her blood pressure is 83/61.        Past Medical History:  Diagnosis Date  . Asthma   . CHF (congestive heart failure) (HCC)   . Chronic back pain   . COPD (chronic obstructive pulmonary disease) (HCC)   . Pulmonary hypertension [facility])     Patient Active Problem List   Diagnosis Date Noted  . Acute respiratory failure (HCC) 05/12/2020  . Acute on chronic  systolic congestive heart failure (HCC)   . Goals of care, counseling/discussion   . Palliative care by specialist   . Sepsis secondary to UTI (HCC) 04/24/2020  . Sepsis (HCC) 04/24/2020  . Pressure injury of skin 10/23/2019  . COVID-19 virus infection 10/19/2019  . Hyponatremia 10/19/2019  . Hypovolemic shock (HCC) 07/22/2019  . Acute respiratory failure with hypoxemia (HCC) 12/04/2018  . Acute respiratory failure with hypoxia (HCC) 06/02/2018  . Diarrhea of presumed infectious origin 02/17/2016  . Right acute serous otitis media 02/17/2016  . COPD (chronic obstructive pulmonary disease) (HCC) 12/23/2015  . Heart failure with preserved ejection fraction (HCC) 12/23/2015  . DDD (degenerative disc disease) 09/25/2013  . Narrowing of intervertebral disc space 09/25/2013  . OSA (obstructive sleep apnea) 07/25/2013  . Deep vein thrombosis (DVT) (HCC) 06/25/2013  . Erythema 06/18/2013  . Left leg pain 06/18/2013  . Mediastinal lymphadenopathy 06/18/2013  . DVT, lower extremity (HCC) 06/14/2013  . Pulmonary hypertension (HCC) 06/14/2013  . Asthma 09/21/2010  . Chronic pain 09/21/2010  . Hypertension, benign 09/21/2010  . Hypothyroidism 09/21/2010  . Venous stasis 09/21/2010    Past Surgical History:  Procedure Laterality Date  . BACK SURGERY      Prior to Admission medications   Medication Sig Start Date End Date Taking? Authorizing Provider  acidophilus (RISAQUAD) CAPS capsule Take 1 capsule by mouth daily. 04/29/20   Enedina Finner, MD  albuterol (VENTOLIN HFA) 108 (90 Base) MCG/ACT inhaler  Inhale 2 puffs into the lungs every 6 (six) hours as needed for wheezing or shortness of breath. 10/25/19   Tyrone NineGrunz, Ryan B, MD  apixaban (ELIQUIS) 2.5 MG TABS tablet Take 1 tablet (2.5 mg total) by mouth 2 (two) times daily. 10/25/19   Tyrone NineGrunz, Ryan B, MD  bisoprolol (ZEBETA) 5 MG tablet Take 5 mg by mouth daily. 03/12/20   [provider]  Cholecalciferol (VITAMIN D) 50 MCG (2000 UT) tablet  Take 2,000 Units by mouth daily.    [provider]  ENTRESTO 24-26 MG Take 1 tablet by mouth 2 (two) times daily. 04/13/20   [provider]  famotidine (PEPCID) 20 MG tablet Take 1 tablet (20 mg total) by mouth 2 (two) times daily. Patient taking differently: Take 20 mg by mouth daily.  10/25/19   Tyrone NineGrunz, Ryan B, MD  Fluticasone-Salmeterol (ADVAIR) 500-50 MCG/DOSE AEPB Inhale 1 puff into the lungs 2 (two) times daily. 10/25/19   Tyrone NineGrunz, Ryan B, MD  guaiFENesin-dextromethorphan (ROBITUSSIN DM) 100-10 MG/5ML syrup Take 5 mLs by mouth every 4 (four) hours as needed for cough. 10/25/19   Tyrone NineGrunz, Ryan B, MD  HYDROcodone-acetaminophen (NORCO/VICODIN) 5-325 MG tablet Take 1 tablet by mouth every 8 (eight) hours as needed for severe pain. 04/29/20   Enedina FinnerPatel, Sona, MD  ipratropium (ATROVENT) 0.02 % nebulizer solution Inhale into the lungs. 11/23/19 11/22/20  [provider]  levothyroxine (SYNTHROID) 200 MCG tablet Take 1 tablet (200 mcg total) by mouth daily at 6 (six) AM. 10/25/19   Tyrone NineGrunz, Ryan B, MD  montelukast (SINGULAIR) 10 MG tablet Take 1 tablet (10 mg total) by mouth at bedtime. 10/25/19   Tyrone NineGrunz, Ryan B, MD  NON FORMULARY by Intrathecal Infusion route continuous. MORPHINE / BUPIVACAINE INTRATHECAL PUMP     [provider]  polyethylene glycol (MIRALAX / GLYCOLAX) 17 g packet Take 17 g by mouth daily as needed (constipation).    [provider]  traZODone (DESYREL) 50 MG tablet Take 150 mg by mouth at bedtime.  04/17/20   [provider]  vancomycin (VANCOCIN) 125 MG capsule Take 1 capsule (125 mg total) by mouth 3 (three) times daily for 14 days. 04/28/20 05/12/20  Lynn Itoavishankar, Jayashree, MD    Allergies Penicillins, Erythromycin, Levofloxacin, Lidocaine, Clindamycin, Levothyroxine, Tiotropium, Doxycycline, and Sulfa antibiotics  Family History  Problem Relation Age of Onset  . Hypertension Mother   . Liver cancer Father     Social History Social  History   Tobacco Use  . Smoking status: Former Smoker    Packs/day: 1.00    Years: 30.00    Pack years: 30.00    Types: Cigarettes    Quit date: 06/09/1999    Years since quitting: 20.9  . Smokeless tobacco: Never Used  Vaping Use  . Vaping Use: Never used  Substance Use Topics  . Alcohol use: No  . Drug use: Never    Review of Systems Constitutional: No fever/chills Eyes: No visual changes. ENT: No sore throat. Cardiovascular: Denies chest pain. Respiratory: Chronic shortness of breath, but with acute hypoxemia in spite of her usual supplemental oxygen. Gastrointestinal: No abdominal pain.  No nausea, no vomiting.  No diarrhea.  No constipation. Genitourinary: Urinary retention.  Negative for dysuria. Musculoskeletal: Negative for neck pain.  Negative for back pain except for the pain of the rash on her skin. Integumentary: Chronic rash reported on her back that is very painful. Neurological: Negative for headaches, focal weakness or numbness.   ____________________________________________   PHYSICAL EXAM:  VITAL SIGNS: ED Triage Vitals  Enc Vitals Group     BP 05/12/20 0008 (!) 89/61     Pulse Rate 05/12/20 0008 78     Resp 05/12/20 0008 11     Temp --      Temp src --      SpO2 05/12/20 0008 92 %     Weight 05/12/20 0009 111.1 kg (245 lb)     Height --      Head Circumference --      Peak Flow --      Pain Score 05/12/20 0009 10     Pain Loc --      Pain Edu? --      Excl. in GC? --     Constitutional: Alert and oriented.  Eyes: Conjunctivae are normal.  Head: Atraumatic. Nose: No congestion/rhinnorhea. Mouth/Throat: Patient is wearing a mask. Neck: No stridor.  No meningeal signs.   Cardiovascular: Normal rate, regular rhythm. Good peripheral circulation. Grossly normal heart sounds. Respiratory: Normal respiratory effort.  No retractions.  Lungs are clear to auscultation bilaterally.  Patient is currently on 10 L nonrebreather. Gastrointestinal:  Obese.  Soft and nontender. No distention.  Musculoskeletal: No lower extremity tenderness nor edema. No gross deformities of extremities. Neurologic:  Normal speech and language. No gross focal neurologic deficits are appreciated.  Psychiatric: Mood and affect are normal. Speech and behavior are normal. Skin: The patient's anterior skin appears normal.  However the majority of her back including her torso, buttocks, and upper thighs, is covered with almost a scalded skin appearance rash with serous fluid draining.  This is apparently chronic as her husband has seen it as recently as earlier today and rubbed lotion on it.  She was apparently also treated for this issue while in the hospital last week.  I see no purulence or evidence of active infection.  The whole area is extremely painful for the patient.    ____________________________________________   LABS (all labs ordered are listed, but only abnormal results are displayed)  Labs Reviewed  CBC WITH DIFFERENTIAL/PLATELET - Abnormal; Notable for the following components:      Result Value   MCHC 29.9 (*)    RDW 19.8 (*)    nRBC 0.5 (*)    All other components within normal limits  URINALYSIS, COMPLETE (UACMP) WITH MICROSCOPIC - Abnormal; Notable for the following components:   Color, Urine YELLOW (*)    APPearance CLEAR (*)    Protein, ur 30 (*)    All other components within normal limits  COMPREHENSIVE METABOLIC PANEL - Abnormal; Notable for the following components:   Glucose, Bld 101 (*)    BUN 37 (*)    Creatinine, Ser 1.23 (*)    Calcium 7.8 (*)    Total Protein 5.5 (*)    Albumin 2.6 (*)    AST 9 (*)    GFR calc non Af Amer 46 (*)    GFR calc Af Amer 53 (*)    All other components within normal limits  CREATININE, SERUM - Abnormal; Notable for the following components:   Creatinine, Ser 1.23 (*)    GFR calc non Af Amer 46 (*)    GFR calc Af Amer 53 (*)    All other components within normal limits  BLOOD GAS,  ARTERIAL - Abnormal; Notable for the following components:   pH, Arterial 7.29 (*)    pCO2 arterial 49 (*)    pO2, Arterial 52 (*)    Acid-base  deficit 3.4 (*)    All other components within normal limits  SARS CORONAVIRUS 2 BY RT PCR (HOSPITAL ORDER, PERFORMED IN [place] [facility])  LACTIC ACID, PLASMA  PROCALCITONIN  CORTISOL  CBC  TSH   ____________________________________________  EKG  ED ECG REPORT I, Loleta Rose, the attending physician, personally viewed and interpreted this ECG.  Date: 05/12/2020 EKG Time: 00: 10 Rate: 77 Rhythm: normal sinus rhythm QRS Axis: normal Intervals: Right bundle branch block ST/T Wave abnormalities: Non-specific ST segment / T-wave changes, but no clear evidence of acute ischemia. Narrative Interpretation: no definitive evidence of acute ischemia; does not meet STEMI criteria.   ____________________________________________  RADIOLOGY I, Loleta Rose, personally viewed and evaluated these images (plain radiographs) as part of my medical decision making, as well as reviewing the written report by the radiologist.  ED MD interpretation: No new infiltrate is noted but the film was difficult due to poor inspiratory effort.  Official radiology report(s): CT Angio Chest PE W/Cm &/Or Wo Cm  Result Date: 05/12/2020 CLINICAL DATA:  64 year old female with history of recent hospital admission for urosepsis. Urinary retention. Low oxygen saturation despite being on 4 L of oxygen. Evaluate for pulmonary embolism. EXAM: CT ANGIOGRAPHY CHEST WITH CONTRAST TECHNIQUE: Multidetector CT imaging of the chest was performed using the standard protocol during bolus administration of intravenous contrast. Multiplanar CT image reconstructions and MIPs were obtained to evaluate the vascular anatomy. CONTRAST:  75mL OMNIPAQUE IOHEXOL 350 MG/ML SOLN COMPARISON:  Chest CT 04/24/2020. FINDINGS: Cardiovascular: No filling defects within the pulmonary arterial tree  to suggest pulmonary embolism. Severe dilatation of the pulmonic trunk (4.8 cm in diameter). Heart size is enlarged with severe right ventricular dilatation. There is no significant pericardial fluid, thickening or pericardial calcification. There is aortic atherosclerosis, as well as atherosclerosis of the great vessels of the mediastinum and the coronary arteries, including calcified atherosclerotic plaque in the left anterior descending coronary artery. Mediastinum/Nodes: No pathologically enlarged mediastinal or hilar lymph nodes. Esophagus is unremarkable in appearance. No axillary lymphadenopathy. Lungs/Pleura: No acute consolidative airspace disease. No pleural effusions. No suspicious appearing pulmonary nodules or masses are noted. Mild diffuse bronchial wall thickening with mild centrilobular emphysema. Upper Abdomen: Unremarkable. Musculoskeletal: There are no aggressive appearing lytic or blastic lesions noted in the visualized portions of the skeleton. Review of the MIP images confirms the above findings. IMPRESSION: 1. No evidence of pulmonary embolism. 2. Cardiomegaly with severe right ventricular dilatation and severe dilatation of the pulmonic trunk. Findings are again suggestive of pulmonary arterial hypertension, likely with chronic right heart strain. 3. Mild diffuse bronchial wall thickening with mild centrilobular emphysema; imaging findings suggestive of underlying COPD. 4. Aortic atherosclerosis, in addition to left anterior descending coronary artery disease. Please note that although the presence of coronary artery calcium documents the presence of coronary artery disease, the severity of this disease and any potential stenosis cannot be assessed on this non-gated CT examination. Assessment for potential risk factor modification, dietary therapy or pharmacologic therapy may be warranted, if clinically indicated. Aortic Atherosclerosis (ICD10-I70.0) and Emphysema (ICD10-J43.9).  Electronically Signed   By: Trudie Reed M.D.   On: 05/12/2020 04:30   DG Chest Port 1 View  Result Date: 05/12/2020 CLINICAL DATA:  History of urinary retention and previous urosepsis EXAM: PORTABLE CHEST 1 VIEW COMPARISON:  04/25/2020 FINDINGS: Cardiac shadow remains enlarged. Overall inspiratory effort is poor. Crowding of the vascular markings is noted. No new focal infiltrate is seen. No bony abnormality is noted. IMPRESSION: Poor  inspiratory effort with crowding of the vascular markings. The overall appearance is similar to that seen on the prior exam. No new infiltrate is noted. Electronically Signed   By: Alcide Clever M.D.   On: 05/12/2020 01:10    ____________________________________________   PROCEDURES   Procedure(s) performed (including Critical Care):  .1-3 Lead EKG Interpretation Performed by: Loleta Rose, MD Authorized by: Loleta Rose, MD     Interpretation: normal     ECG rate:  79   ECG rate assessment: normal     Rhythm: sinus rhythm     Ectopy: none     Conduction: normal    .Critical Care Performed by: Loleta Rose, MD Authorized by: Loleta Rose, MD   Critical care provider statement:    Critical care time (minutes):  45   Critical care time was exclusive of:  Separately billable procedures and treating other patients   Critical care was necessary to treat or prevent imminent or life-threatening deterioration of the following conditions:  Respiratory failure   Critical care was time spent personally by me on the following activities:  Development of treatment plan with patient or surrogate, discussions with consultants, evaluation of patient's response to treatment, examination of patient, obtaining history from patient or surrogate, ordering and performing treatments and interventions, ordering and review of laboratory studies, ordering and review of radiographic studies, pulse oximetry, re-evaluation of patient's condition and review of old  charts     ____________________________________________   INITIAL IMPRESSION / MDM / ASSESSMENT AND PLAN / ED COURSE  As part of my medical decision making, I reviewed the following data within the electronic MEDICAL RECORD NUMBER History obtained from family, Nursing notes reviewed and incorporated, Labs reviewed , EKG interpreted , Old chart reviewed, Radiograph reviewed , Discussed with admitting physician , A consult was requested and obtained from this/these consultant(s) (Intensivist) and Notes from prior ED visits   Differential diagnosis includes, but is not limited to, sepsis, septic shock, persistent UTI, pneumonia, bacteremia, renal failure, electrolyte or metabolic abnormality, PE.  The patient is profoundly hypoxemic even compared to baseline and she is hypertensive.  It is hard not to consider this shock, but she is afebrile at least through an oral temperature and is not tachycardic.  She is even asymptomatic other than the feeling of pressure and urinary retention.  We talked about the disadvantages of putting in a Foley catheter, specifically the risk of reintroducing infection, but I am concerned that she will need an indwelling catheter for urinary retention, temperature monitoring, strict I/O monitoring, etc. So I asked the nurse to place a temp Foley so we could know exactly how much is in her bladder as well as to get a true core body temperature.  Lactic acid is normal, procalcitonin is pending.  CBC is normal with no leukocytosis.    Again, at this point she does not meet criteria for sepsis but it is difficult to explain her hypotension and hypoxemia and was perhaps she is in renal failure and severely volume depleted.  Based on her echocardiogram from couple of weeks ago she has not estimated EF of 35 to 40%.  Given the hypotension I will give her 500 mL of normal saline IV fluid bolus to see if this improves her pressure while the rest of the work-up is pending.  She is  currently comfortable on the nonrebreather.  The patient is on the cardiac monitor to evaluate for evidence of arrhythmia and/or significant heart rate changes.  Clinical Course as of May 12 812  Mon May 12, 2020  0129 No obvious new infiltrate but poor inspiratory effort makes film interpretation difficult  DG Chest [facility] [CF]  [phone] Urinalysis appears clean, although of course the patient reportedly just finished antibiotics  Urinalysis, Complete w Microscopic(!) [CF]  0301 Essentially normal pro calcitonin  Procalcitonin: 0.10 [CF]  0509 The patient's work-up has been generally reassuring.  Other than a lot of chronic issues, including pulmonary hypertension and right heart strain, there is not evidence of acute issues tonight.  However she remains severely hypoxemic compared to baseline and hypertensive although her last blood pressure has improved.I discussed with the patient and the discussed with the ICU nurse practitioner, Seward Grater.  She will put in admission orders to the unit given her abnormal vital signs.  We are going to avoid empiric antibiotics given no obvious sign of infection at this time and her back rash/scalded skin appearance seems to be chronic based on her and her husband's description.   [CF]    Clinical Course User Index [CF] Loleta Rose, MD     ____________________________________________  FINAL CLINICAL IMPRESSION(S) / ED DIAGNOSES  Final diagnoses:  Hypotension, unspecified hypotension type  Hypoxemia  Scald of skin  Acute on chronic respiratory failure with hypoxia (HCC)     MEDICATIONS GIVEN DURING THIS VISIT:  Medications  sodium chloride flush (NS) 0.9 % injection 10-40 mL (10 mLs Intracatheter Given 05/12/20 0609)  sodium chloride flush (NS) 0.9 % injection 10-40 mL (has no administration in time range)  docusate sodium (COLACE) capsule 100 mg (has no administration in time range)  polyethylene glycol (MIRALAX / GLYCOLAX) packet 17 g  (has no administration in time range)  acetaminophen (TYLENOL) tablet 650 mg (has no administration in time range)  pantoprazole (PROTONIX) injection 40 mg (has no administration in time range)  0.9 %  sodium chloride infusion (has no administration in time range)  norepinephrine (LEVOPHED) 4mg  in premix infusion (2 mcg/min Intravenous New Bag/Given 05/12/20 0610)  ipratropium-albuterol (DUONEB) 0.5-2.5 (3) MG/3ML nebulizer solution 3 mL (has no administration in time range)  budesonide (PULMICORT) nebulizer solution 0.5 mg (has no administration in time range)  HYDROcodone-acetaminophen (NORCO/VICODIN) 5-325 MG per tablet 1 tablet (has no administration in time range)  vancomycin (VANCOCIN) 125 MG capsule 125 mg (has no administration in time range)  bisoprolol (ZEBETA) tablet 5 mg (has no administration in time range)  traZODone (DESYREL) tablet 150 mg (has no administration in time range)  levothyroxine (SYNTHROID) tablet 200 mcg (has no administration in time range)  acidophilus (RISAQUAD) capsule 1 capsule (has no administration in time range)  apixaban (ELIQUIS) tablet 2.5 mg (has no administration in time range)  Vitamin D 2,000 Units (has no administration in time range)  guaiFENesin-dextromethorphan (ROBITUSSIN DM) 100-10 MG/5ML syrup 5 mL (has no administration in time range)  montelukast (SINGULAIR) tablet 10 mg (has no administration in time range)  hydrocortisone sodium succinate (SOLU-CORTEF) 100 MG injection 50 mg (has no administration in time range)  silver sulfADIAZINE (SILVADENE) 1 % cream (has no administration in time range)  sodium chloride flush (NS) 0.9 % injection 3 mL (3 mLs Intravenous Given 05/12/20 0609)  sodium chloride 0.9 % bolus 500 mL ( Intravenous Stopped 05/12/20 0213)  iohexol (OMNIPAQUE) 350 MG/ML injection 75 mL (75 mLs Intravenous Contrast Given 05/12/20 0344)  HYDROcodone-acetaminophen (NORCO/VICODIN) 5-325 MG per tablet 2 tablet (2 tablets Oral Given  05/12/20 0556)     ED Discharge Orders  None      *Please note:  Regina White was evaluated in Emergency Department on 05/12/2020 for the symptoms described in the history of present illness. She was evaluated in the context of the global COVID-19 pandemic, which necessitated consideration that the patient might be at risk for infection with the SARS-CoV-2 virus that causes COVID-19. Institutional protocols and algorithms that pertain to the evaluation of patients at risk for COVID-19 are in a state of rapid change based on information released by regulatory bodies including the CDC and federal and state organizations. These policies and algorithms were followed during the patient's care in the ED.  Some ED evaluations and interventions may be delayed as a result of limited staffing during and after the pandemic.*  Note:  This document was prepared using Dragon voice recognition software and may include unintentional dictation errors.   Loleta Rose, MD 05/12/20 1610    Loleta Rose, MD 05/12/20 [phone]

## 2020-05-12 NOTE — Progress Notes (Deleted)
Tube feeds rate reduced to 33ml/hr per Dr. Karna Christmas.

## 2020-05-12 NOTE — ED Triage Notes (Signed)
Pt to ED with multiple medical issues but reports main complaint today is urinary retension for the past 24 hours. Pt was recently in the hospital and treated for urosepsis. Pt has completed all antibiotics but the vancomycin.   Pt denies feeling SOB but EMS reports they found her at 68% on her chronic 4L. Pt is 97 on 10L at this time. Pt also has chronic left hip pain with a replacement scheduled. (pt tearful in ED about pain in hip and sores on glutes, unable to roll patient to assess at this time due to pain.)

## 2020-05-12 NOTE — Progress Notes (Signed)
Pt on PO vancomycin for C-diff treatment in the past. Per Jason Fila with infection prevention, no isolation needed if pt is not having diarrhea and has not had a positive test in past 30 days.

## 2020-05-12 NOTE — ED Notes (Signed)
Pt transported to CT ?

## 2020-05-12 NOTE — ED Notes (Signed)
CT made aware of access.

## 2020-05-12 NOTE — Consult Note (Signed)
WOC Nurse Consult Note: Patient receiving care in [facility] ICU 3. Patient is COVID +. Consult completed remotely after review of record, including image of back/buttocks. Reason for Consult: erythematous rash to back and buttocks Wound type: uncertain at this time. Pressure Injury POA: Yes/No/NA Measurement: Wound bed: see photo Drainage (amount, consistency, odor)  Periwound: Dressing procedure/placement/frequency: TID application of Gerhardt's butt cream to affected areas. Monitor the wound area(s) for worsening of condition such as: Signs/symptoms of infection,  Increase in size,  Development of or worsening of odor, Development of pain, or increased pain at the affected locations.  Notify the medical team if any of these develop.  Thank you for the consult. WOC nurse will not follow at this time.  Please re-consult the WOC team if needed.  Helmut Muster, RN, MSN, CWOCN, CNS-BC, pager [phone]

## 2020-05-13 ENCOUNTER — Inpatient Hospital Stay: Payer: Medicare Other

## 2020-05-13 DIAGNOSIS — J9621 Acute and chronic respiratory failure with hypoxia: Secondary | ICD-10-CM

## 2020-05-13 DIAGNOSIS — I5022 Chronic systolic (congestive) heart failure: Secondary | ICD-10-CM

## 2020-05-13 DIAGNOSIS — I959 Hypotension, unspecified: Secondary | ICD-10-CM

## 2020-05-13 DIAGNOSIS — Z515 Encounter for palliative care: Secondary | ICD-10-CM

## 2020-05-13 DIAGNOSIS — R34 Anuria and oliguria: Secondary | ICD-10-CM

## 2020-05-13 DIAGNOSIS — Z7189 Other specified counseling: Secondary | ICD-10-CM

## 2020-05-13 LAB — BASIC METABOLIC PANEL
Anion gap: 7 (ref 5–15)
BUN: 31 mg/dL — ABNORMAL HIGH (ref 8–23)
CO2: 24 mmol/L (ref 22–32)
Calcium: 8.2 mg/dL — ABNORMAL LOW (ref 8.9–10.3)
Chloride: 110 mmol/L (ref 98–111)
Creatinine, Ser: 0.85 mg/dL (ref 0.44–1.00)
GFR calc Af Amer: >60 mL/min (ref >60)
GFR calc non Af Amer: >60 mL/min (ref >60)
Glucose, Bld: 143 mg/dL — ABNORMAL HIGH (ref 70–99)
Potassium: 4.6 mmol/L (ref 3.5–5.1)
Sodium: 141 mmol/L (ref 135–145)

## 2020-05-13 LAB — MAGNESIUM: Magnesium: 1.8 mg/dL (ref 1.7–2.4)

## 2020-05-13 LAB — CBC
HCT: 34.5 % — ABNORMAL LOW (ref 36.0–46.0)
Hemoglobin: 11.3 g/dL — ABNORMAL LOW (ref 12.0–15.0)
MCH: 29.3 pg (ref 26.0–34.0)
MCHC: 32.8 g/dL (ref 30.0–36.0)
MCV: 89.4 fL (ref 80.0–100.0)
Platelets: 168 10*3/uL (ref 150–400)
RBC: 3.86 MIL/uL — ABNORMAL LOW (ref 3.87–5.11)
RDW: 18.9 % — ABNORMAL HIGH (ref 11.5–15.5)
WBC: 5 10*3/uL (ref 4.0–10.5)
nRBC: 0.4 % — ABNORMAL HIGH (ref 0.0–0.2)

## 2020-05-13 LAB — PHOSPHORUS: Phosphorus: 3.4 mg/dL (ref 2.5–4.6)

## 2020-05-13 MED ORDER — FENTANYL CITRATE (PF) 100 MCG/2ML IJ SOLN
12.5000 ug | INTRAMUSCULAR | Status: DC | PRN
  Administered 2020-05-13 (×2): 12.5 ug via INTRAVENOUS
  Filled 2020-05-13 (×2): qty 2

## 2020-05-13 MED ORDER — MAGNESIUM SULFATE 2 GM/50ML IV SOLN
2.0000 g | Freq: Once | INTRAVENOUS | Status: AC
  Administered 2020-05-13: 2 g via INTRAVENOUS
  Filled 2020-05-13: qty 50

## 2020-05-13 MED ORDER — HYDROMORPHONE HCL 1 MG/ML IJ SOLN
1.0000 mg | INTRAMUSCULAR | Status: DC | PRN
  Administered 2020-05-13 – 2020-05-17 (×10): 1 mg via INTRAVENOUS
  Filled 2020-05-13 (×10): qty 1

## 2020-05-13 MED ORDER — HYDROCODONE-ACETAMINOPHEN 5-325 MG PO TABS
1.0000 | ORAL_TABLET | Freq: Four times a day (QID) | ORAL | Status: DC | PRN
  Administered 2020-05-14 – 2020-05-19 (×14): 2 via ORAL
  Filled 2020-05-13 (×15): qty 2

## 2020-05-13 MED ORDER — FUROSEMIDE 10 MG/ML IJ SOLN
40.0000 mg | Freq: Two times a day (BID) | INTRAMUSCULAR | Status: DC
  Administered 2020-05-13 (×2): 40 mg via INTRAVENOUS
  Filled 2020-05-13 (×2): qty 4

## 2020-05-13 NOTE — Consult Note (Signed)
Consultation Note Date: 05/13/2020   Patient Name: Regina White  DOB: [birth year]  MRN: [id number]  Age / Sex: 64 y.o., female  PCP: Cain Sieve, MD Referring Physician: Vida Rigger, MD  Reason for Consultation: Establishing goals of care  HPI/Patient Profile: This is a 64 y/o female with a complex medical history who was recently hospitalized for Streptococcus agalactiae bacteremia from UTI and cellulitis of the posterior thigh, back and buttocks, acute on chronic hypoxic respiratory failure, acute on chronic heart failure and severe septic shock.   Clinical Assessment and Goals of Care: Patient is resting in bed. She states she is married and has 3 children. She states her oldest is an Pensions consultant.   She discusses her health decline and deconditioning. She states she had Covid when it first began. She had c-diff, a blood clot with thrombectomy, BP problems, and then Covid again. She discusses being in SNF's, and discusses living with her daughter prior to moving back home to live with her husband.   We discussed her diagnosis, prognosis, GOC, EOL wishes disposition and options.  A detailed discussion was had today regarding advanced directives.  Concepts specific to code status, artifical feeding and hydration, IV antibiotics and rehospitalization were discussed.  The difference between an aggressive medical intervention path and a comfort care path was discussed.  Values and goals of care important to patient and family were attempted to be elicited.  Discussed limitations of medical interventions to prolong quality of life in some situations and discussed the concept of human mortality.  She states she is waiting to meet with an orthopedist at [facility] regarding her hip. She is hopeful to be ambulatory again. She states she wants all care at this time. She states she would like her husband to be  her HPOA, and then 1 of her sons. Chaplain in to bedside to meet with patient.     SUMMARY OF RECOMMENDATIONS    Full code/full scope.  Recommend palliative to follow outpatient.   Prognosis:   Unable to determine      Primary Diagnoses: Present on Admission: **None**   I have reviewed the medical record, interviewed the patient and family, and examined the patient. The following aspects are pertinent.  Past Medical History:  Diagnosis Date  . Asthma   . CHF (congestive heart failure) (HCC)   . Chronic back pain   . COPD (chronic obstructive pulmonary disease) (HCC)   . Pulmonary hypertension (HCC)    Social History   Socioeconomic History  . Marital status: Married    Spouse name: Not on file  . Number of children: Not on file  . Years of education: Not on file  . Highest education level: Not on file  Occupational History  . Not on file  Tobacco Use  . Smoking status: Former Smoker    Packs/day: 1.00    Years: 30.00    Pack years: 30.00    Types: Cigarettes    Quit date: 06/09/1999    Years since quitting: 20.9  .  Smokeless tobacco: Never Used  Vaping Use  . Vaping Use: Never used  Substance and Sexual Activity  . Alcohol use: No  . Drug use: Never  . Sexual activity: Not Currently  Other Topics Concern  . Not on file  Social History Narrative   [facility] at this time   Social Determinants of Health   Financial Resource Strain:   . Difficulty of Paying Living Expenses:   Food Insecurity:   . Worried About Programme researcher, broadcasting/film/video in the Last Year:   . Barista in the Last Year:   Transportation Needs:   . Freight forwarder (Medical):   Marland Kitchen Lack of Transportation (Non-Medical):   Physical Activity:   . Days of Exercise per Week:   . Minutes of Exercise per Session:   Stress:   . Feeling of Stress :   Social Connections:   . Frequency of Communication with Friends and Family:   . Frequency of Social Gatherings with Friends  and Family:   . Attends Religious Services:   . Active Member of Clubs or Organizations:   . Attends Banker Meetings:   Marland Kitchen Marital Status:    Family History  Problem Relation Age of Onset  . Hypertension Mother   . Liver cancer Father    Scheduled Meds: . acidophilus  1 capsule Oral Daily  . apixaban  2.5 mg Oral BID  . budesonide (PULMICORT) nebulizer solution  0.5 mg Nebulization BID  . Chlorhexidine Gluconate Cloth  6 each Topical Daily  . cholecalciferol  2,000 Units Oral Daily  . furosemide  40 mg Intravenous Q12H  . Gerhardt's butt cream   Topical TID  . hydrocortisone sod succinate (SOLU-CORTEF) inj  50 mg Intravenous Q6H  . ipratropium-albuterol  3 mL Nebulization Q6H  . levothyroxine  200 mcg Oral Q0600  . midodrine  10 mg Oral TID WC  . montelukast  10 mg Oral QHS  . pantoprazole (PROTONIX) IV  40 mg Intravenous QHS  . silver sulfADIAZINE   Topical BID  . sodium chloride flush  10-40 mL Intracatheter Q12H  . traZODone  150 mg Oral QHS  . vancomycin  125 mg Oral TID   Continuous Infusions: . sodium chloride     PRN Meds:.acetaminophen, docusate sodium, fentaNYL (SUBLIMAZE) injection, guaiFENesin-dextromethorphan, HYDROcodone-acetaminophen, HYDROmorphone (DILAUDID) injection, polyethylene glycol, sodium chloride flush Medications Prior to Admission:  Prior to Admission medications   Medication Sig Start Date End Date Taking? Authorizing Provider  acidophilus (RISAQUAD) CAPS capsule Take 1 capsule by mouth daily. 04/29/20  Yes Enedina Finner, MD  albuterol (VENTOLIN HFA) 108 (90 Base) MCG/ACT inhaler Inhale 2 puffs into the lungs every 6 (six) hours as needed for wheezing or shortness of breath. 10/25/19  Yes Tyrone Nine, MD  apixaban (ELIQUIS) 2.5 MG TABS tablet Take 1 tablet (2.5 mg total) by mouth 2 (two) times daily. 10/25/19  Yes Tyrone Nine, MD  bisoprolol (ZEBETA) 5 MG tablet Take 5 mg by mouth daily. 03/12/20  Yes [provider]    Cholecalciferol (VITAMIN D) 50 MCG (2000 UT) tablet Take 2,000 Units by mouth daily.   Yes [provider]  ENTRESTO 24-26 MG Take 1 tablet by mouth 2 (two) times daily. 04/13/20  Yes [provider]  famotidine (PEPCID) 20 MG tablet Take 1 tablet (20 mg total) by mouth 2 (two) times daily. Patient taking differently: Take 20 mg by mouth daily.  10/25/19  Yes Tyrone Nine,  MD  Fluticasone-Salmeterol (ADVAIR) 500-50 MCG/DOSE AEPB Inhale 1 puff into the lungs 2 (two) times daily. 10/25/19  Yes Tyrone Nine, MD  guaiFENesin-dextromethorphan (ROBITUSSIN DM) 100-10 MG/5ML syrup Take 5 mLs by mouth every 4 (four) hours as needed for cough. 10/25/19  Yes Tyrone Nine, MD  HYDROcodone-acetaminophen (NORCO/VICODIN) 5-325 MG tablet Take 1 tablet by mouth every 8 (eight) hours as needed for severe pain. 04/29/20  Yes Enedina Finner, MD  levothyroxine (SYNTHROID) 200 MCG tablet Take 1 tablet (200 mcg total) by mouth daily at 6 (six) AM. 10/25/19  Yes Tyrone Nine, MD  montelukast (SINGULAIR) 10 MG tablet Take 1 tablet (10 mg total) by mouth at bedtime. 10/25/19  Yes Tyrone Nine, MD  NON FORMULARY by Intrathecal Infusion route continuous. MORPHINE / BUPIVACAINE INTRATHECAL PUMP    Yes [provider]  polyethylene glycol (MIRALAX / GLYCOLAX) 17 g packet Take 17 g by mouth daily as needed (constipation).   Yes [provider]  traZODone (DESYREL) 50 MG tablet Take 150 mg by mouth at bedtime.  04/17/20  Yes [provider]   Allergies  Allergen Reactions  . Penicillins Anaphylaxis    Breathing problems Did it involve swelling of the face/tongue/throat, SOB, or low BP? Yes Did it involve sudden or severe rash/hives, skin peeling, or any reaction on the inside of your mouth or nose? Unknown Did you need to seek medical attention at a hospital or doctor's office? Unknown When did it last happen?unknown If all above answers are "NO", may proceed with cephalosporin  use. Patient tolerated Ceftriaxone September 2020  . Erythromycin Nausea And Vomiting and Rash    STOMACH PAIN  . Levofloxacin Other (See Comments)    Muscle aches  . Lidocaine Rash  . Clindamycin Other (See Comments)    Recurrent C diff  . Levothyroxine Other (See Comments)    Vaginal bleeding Reaction to generic (pt currently takes name brand Synthroid)  . Tiotropium Other (See Comments)    Coughing up blood  . Doxycycline Nausea And Vomiting  . Sulfa Antibiotics Rash   Review of Systems  Skin:       Discusses skin breakdown n her backside.     Physical Exam Pulmonary:     Effort: Pulmonary effort is normal.  Neurological:     Mental Status: She is alert.     Vital Signs: BP 127/75   Pulse 69   Temp 98.1 F (36.7 C)   Resp 18   Ht 5\' 5"  (1.651 m)   Wt 108.8 kg   LMP  (LMP Unknown)   SpO2 95%   BMI 39.91 kg/m  Pain Scale: 0-10 POSS *See Group Information*: S-Acceptable,Sleep, easy to arouse Pain Score: 7    SpO2: SpO2: 95 % O2 Device:SpO2: 95 % O2 Flow Rate: .O2 Flow Rate (L/min): (S) 6 L/min  IO: Intake/output summary:   Intake/Output Summary (Last 24 hours) at 05/13/2020 1456 Last data filed at 05/13/2020 1300 Gross per 24 hour  Intake 162.36 ml  Output 2850 ml  Net -2687.64 ml    LBM: Last BM Date: 05/12/20 Baseline Weight: Weight: 111.1 kg Most recent weight: Weight: 108.8 kg     Palliative Assessment/Data:     Time In: 2:20 Time Out: 3:00 Time Total: 40 min Greater than 50%  of this time was spent counseling and coordinating care related to the above assessment and plan.  Signed by: 07/13/20, NP   Please contact Palliative Medicine Team phone at [phone]  for questions and concerns.  For individual provider: See Shea Evans

## 2020-05-13 NOTE — Progress Notes (Signed)
CH planned visit per RN suggestion that pt. may benefit from pastoral support after waking up in severe pain and overwhelmed this AM; CH passing by rm. and was waved in by palliative care NP.  NP explained pt. would  like to complete AD to ensure that her son be made alternate HCA (husband will be primary HCA).  NP excused herself and [facility] visited w/pt. and husband for an extended time. Pt. 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  Pt. said she had been living with her dtr. ever since she was discharged from the hospital after battling COVID in Dec. 2020 until about a month ago.  Pt. shared tearfully that her relationship w/dtr. had deteriorated significantly over the time she lived in her house; Dtr. reportedly was not willing to help pt. go to the bathroom during the night; husband said he eventually took pt. home to further recover there.  Husband was assisted in care of pt. by a home health aide, but husband said aide was not helpful and failed to recognize pt.'s need for hospitalization until an RN from the health department examined pt. during a visit for COVID vaccination.  Pt. shared that she lost her mother to COVID this past April. Pt. is drawing upon her faith to help her make meaning of her suffering but also wonders what God might be doing in allowing such hardship.  CH provided extended supportive listening and exploration of pt.'s support systems; CH also assisted pt. in completing AD and plans to follow up tomorrow with notary and witnesses.

## 2020-05-13 NOTE — Consult Note (Signed)
Cardiology Consultation:   Patient ID: Regina White MRN: [id number]; DOB: [birth year]  Admit date: 05/11/2020 Date of Consult: 05/13/2020  Primary Care Provider: Cain Sieve, MD [facility] Cardiologist: Freeman Caldron, MD - [facility] Electrophysiologist:  None    Patient Profile:   Regina White is a 64 y.o. female with a hx of chronic HFrEF with biventricular failure and pulmonary hypertension, chronic respiratory failure with hypoxia due to COPD complicated by COVID-19 infection in 10/2019, left lower extremity DVT status post left iliac stent on indefinite anticoagulation, hypertension, morbid obesity, and obstructive sleep apnea, who is being seen today for the evaluation of hypotension and acute on chronic hypoxic respiratory failure concerning for decompensated heart failure and cardiogenic shock at the request of Dr. Karna Christmas.  History of Present Illness:   Regina White was hospitalized last month with similar presentation of hypotension and hypoxia.  She was ultimately found to have Streptococcus agalactiae bacteremia.  Potential sources were felt to be urine and skin.  She was discharged home on 04/29/2020 and was doing relatively well up until 3 days ago when she began to have difficulty voiding.  Two days ago, she was unable to void for the entire day, prompting her to come to the emergency department.  Foley catheter was ultimately placed, though there is no documentation of urine output with initial catheter insertion.  In the emergency department, Regina White was also noted to be severely hypoxic with an oxygen saturation of 68% on 4 L via nasal cannula.  This improved to 88% on 12 L via nasal cannula.  She was also hypotensive and has intermittently required norepinephrine.  Interestingly, Regina White denies having felt short of breath.  She also denies chest pain, palpitations, and lightheadedness.  She developed some edema during her hospitalization  last month and felt like it never completely resolved after returning home.  Over the 1 to 2 days leading up to this admission, Regina White also felt like she may be retaining fluid.  All goal-directed medical therapy is currently on hold in regard to her heart failure due to hypotension.  Regina White has been bedbound for several months due to severe pain involving the left hip.  She attributes destruction of her left hip joint to the left lower extremity DVT that she developed last year.  In the ED, Regina White was again noted to have extensive erythema in a dependent location on her back, buttocks, and thighs.   Past Medical History:  Diagnosis Date  . Asthma   . CHF (congestive heart failure) (HCC)   . Chronic back pain   . COPD (chronic obstructive pulmonary disease) (HCC)   . Pulmonary hypertension (HCC)     Past Surgical History:  Procedure Laterality Date  . BACK SURGERY       Home Medications:  Prior to Admission medications   Medication Sig Start Date Delania Ferg Date Taking? Authorizing Provider  acidophilus (RISAQUAD) CAPS capsule Take 1 capsule by mouth daily. 04/29/20  Yes Enedina Finner, MD  albuterol (VENTOLIN HFA) 108 (90 Base) MCG/ACT inhaler Inhale 2 puffs into the lungs every 6 (six) hours as needed for wheezing or shortness of breath. 10/25/19  Yes Tyrone Nine, MD  apixaban (ELIQUIS) 2.5 MG TABS tablet Take 1 tablet (2.5 mg total) by mouth 2 (two) times daily. 10/25/19  Yes Tyrone Nine, MD  bisoprolol (ZEBETA) 5 MG tablet Take 5 mg by mouth daily. 03/12/20  Yes [provider]  Cholecalciferol (VITAMIN D)  50 MCG (2000 UT) tablet Take 2,000 Units by mouth daily.   Yes [provider]  ENTRESTO 24-26 MG Take 1 tablet by mouth 2 (two) times daily. 04/13/20  Yes [provider]  famotidine (PEPCID) 20 MG tablet Take 1 tablet (20 mg total) by mouth 2 (two) times daily. Patient taking differently: Take 20 mg by mouth daily.  10/25/19  Yes Tyrone Nine, MD    Fluticasone-Salmeterol (ADVAIR) 500-50 MCG/DOSE AEPB Inhale 1 puff into the lungs 2 (two) times daily. 10/25/19  Yes Tyrone Nine, MD  guaiFENesin-dextromethorphan (ROBITUSSIN DM) 100-10 MG/5ML syrup Take 5 mLs by mouth every 4 (four) hours as needed for cough. 10/25/19  Yes Tyrone Nine, MD  HYDROcodone-acetaminophen (NORCO/VICODIN) 5-325 MG tablet Take 1 tablet by mouth every 8 (eight) hours as needed for severe pain. 04/29/20  Yes Enedina Finner, MD  levothyroxine (SYNTHROID) 200 MCG tablet Take 1 tablet (200 mcg total) by mouth daily at 6 (six) AM. 10/25/19  Yes Tyrone Nine, MD  montelukast (SINGULAIR) 10 MG tablet Take 1 tablet (10 mg total) by mouth at bedtime. 10/25/19  Yes Tyrone Nine, MD  NON FORMULARY by Intrathecal Infusion route continuous. MORPHINE / BUPIVACAINE INTRATHECAL PUMP    Yes [provider]  polyethylene glycol (MIRALAX / GLYCOLAX) 17 g packet Take 17 g by mouth daily as needed (constipation).   Yes [provider]  traZODone (DESYREL) 50 MG tablet Take 150 mg by mouth at bedtime.  04/17/20  Yes [provider]    Inpatient Medications: Scheduled Meds: . acidophilus  1 capsule Oral Daily  . apixaban  2.5 mg Oral BID  . budesonide (PULMICORT) nebulizer solution  0.5 mg Nebulization BID  . Chlorhexidine Gluconate Cloth  6 each Topical Daily  . cholecalciferol  2,000 Units Oral Daily  . furosemide  40 mg Intravenous Q12H  . Gerhardt's butt cream   Topical TID  . hydrocortisone sod succinate (SOLU-CORTEF) inj  50 mg Intravenous Q6H  . ipratropium-albuterol  3 mL Nebulization Q6H  . levothyroxine  200 mcg Oral Q0600  . midodrine  10 mg Oral TID WC  . montelukast  10 mg Oral QHS  . pantoprazole (PROTONIX) IV  40 mg Intravenous QHS  . silver sulfADIAZINE   Topical BID  . sodium chloride flush  10-40 mL Intracatheter Q12H  . traZODone  150 mg Oral QHS  . vancomycin  125 mg Oral TID   Continuous Infusions: . sodium chloride     PRN  Meds: acetaminophen, docusate sodium, fentaNYL (SUBLIMAZE) injection, guaiFENesin-dextromethorphan, HYDROcodone-acetaminophen, HYDROmorphone (DILAUDID) injection, polyethylene glycol, sodium chloride flush  Allergies:    Allergies  Allergen Reactions  . Penicillins Anaphylaxis    Breathing problems Did it involve swelling of the face/tongue/throat, SOB, or low BP? Yes Did it involve sudden or severe rash/hives, skin peeling, or any reaction on the inside of your mouth or nose? Unknown Did you need to seek medical attention at a hospital or doctor's office? Unknown When did it last happen?unknown If all above answers are "NO", may proceed with cephalosporin use. Patient tolerated Ceftriaxone September 2020  . Erythromycin Nausea And Vomiting and Rash    STOMACH PAIN  . Levofloxacin Other (See Comments)    Muscle aches  . Lidocaine Rash  . Clindamycin Other (See Comments)    Recurrent C diff  . Levothyroxine Other (See Comments)    Vaginal bleeding Reaction to generic (pt currently takes name brand Synthroid)  . Tiotropium Other (See  Comments)    Coughing up blood  . Doxycycline Nausea And Vomiting  . Sulfa Antibiotics Rash    Social History:   Social History   Tobacco Use  . Smoking status: Former Smoker    Packs/day: 1.00    Years: 30.00    Pack years: 30.00    Types: Cigarettes    Quit date: 06/09/1999    Years since quitting: 20.9  . Smokeless tobacco: Never Used  Vaping Use  . Vaping Use: Never used  Substance Use Topics  . Alcohol use: No  . Drug use: Never     Family History:    Family History  Problem Relation Age of Onset  . Hypertension Mother   . Liver cancer Father      ROS:  Please see the history of present illness. All other ROS reviewed and negative.     Physical Exam/Data:   Vitals:   05/13/20 0800 05/13/20 1000 05/13/20 1029 05/13/20 1100  BP: 129/73 136/81  131/75  Pulse: 66 68 67 75  Resp: 17 13 13 18   Temp: (!) 97.5 F (36.4 C)  98.2 F (36.8 C) 98.4 F (36.9 C) 98.2 F (36.8 C)  TempSrc:      SpO2: 98% 93% 96% 94%  Weight:      Height:        Intake/Output Summary (Last 24 hours) at 05/13/2020 1252 Last data filed at 05/13/2020 1000 Gross per 24 hour  Intake 162.36 ml  Output 975 ml  Net -812.64 ml   Last 3 Weights 05/13/2020 05/12/2020 05/12/2020  Weight (lbs) 239 lb 13.8 oz 238 lb 1.6 oz 245 lb  Weight (kg) 108.8 kg 108 kg 111.131 kg     Body mass index is 39.91 kg/m.  General: Chronically ill-appearing, obese woman, lying in bed.  Her husband is at the bedside. HEENT: normal Lymph: no adenopathy Neck: no JVD, though evaluation is limited by body habitus and positioning. Endocrine:  No thryomegaly Vascular: No carotid bruits; 1+ radial pulses bilaterally. Cardiac: Distant heart sounds.  Regular rate and rhythm without murmurs, rubs, or gallops. Lungs: Decreased inspiratory effort secondary to pain.  Mildly diminished breath sounds throughout without wheezes or crackles. Abd: soft, nontender, no hepatomegaly  Ext: no edema Musculoskeletal:  No deformities, BUE and BLE strength normal and equal Skin: Warm and dry.  Unable to assess back and posterior thighs, as patient is unable to move without significant assistance. Neuro:  CNs 2-12 intact, no focal abnormalities noted Psych:  Normal affect   EKG:  The EKG was personally reviewed and demonstrates: Normal sinus rhythm with first-degree AV block and isolated PAC, right axis deviation, and right bundle branch block.  Compared with prior tracing from 04/25/2020, PAT is no longer present. Telemetry:  Telemetry was personally reviewed and demonstrates: Sinus rhythm with PVCs and nonsustained ventricular tachycardia of up to 5 beats.  Relevant CV Studies:  1. Left ventricular ejection fraction, by estimation, is 35 to 40%. The  left ventricle has moderately decreased function. The left ventricle  demonstrates global hypokinesis. The left ventricular internal  cavity size  was mildly dilated. Left ventricular  diastolic parameters are consistent with Grade I diastolic dysfunction  (impaired relaxation).  2. Right ventricular systolic function is moderately depressed. The right  ventricular size is moderately dilated. There is severely elevated  pulmonary artery systolic pressure.  3. Left atrial size was mildly dilated.  4. Tricuspid valve regurgitation is mild to moderate.  5. The inferior vena cava is  dilated in size with >50% respiratory  variability, suggesting right atrial pressure of 8 mmHg.   Laboratory Data:  High Sensitivity Troponin:   Recent Labs  Lab 04/24/20 2157 04/25/20 0053 04/25/20 1916 05/12/20 0846  TROPONINIHS 42* 48* 19* 10     Chemistry Recent Labs  Lab 05/12/20 0136 05/12/20 0605 05/13/20 0612  NA 143  --  141  K 4.6  --  4.6  CL 111  --  110  CO2 23  --  24  GLUCOSE 101*  --  143*  BUN 37*  --  31*  CREATININE 1.23* 1.23* 0.85  CALCIUM 7.8*  --  8.2*  GFRNONAA 46* 46* >60  GFRAA 53* 53* >60  ANIONGAP 9  --  7    Recent Labs  Lab 05/12/20 0136  PROT 5.5*  ALBUMIN 2.6*  AST 9*  ALT 9  ALKPHOS 47  BILITOT 0.5   Hematology Recent Labs  Lab 05/12/20 0056 05/12/20 0846 05/13/20 0612  WBC 6.7 8.2 5.0  RBC 4.25 4.43 3.86*  HGB 12.2 12.7 11.3*  HCT 40.8 41.8 34.5*  MCV 96.0 94.4 89.4  MCH 28.7 28.7 29.3  MCHC 29.9* 30.4 32.8  RDW 19.8* 19.9* 18.9*  PLT 171 181 168   BNP Recent Labs  Lab 05/12/20 0846  BNP 564.1*    DDimer No results for input(s): DDIMER in the last 168 hours.   Radiology/Studies:  CT Angio Chest PE W/Cm &/Or Wo Cm  Result Date: 05/12/2020 CLINICAL DATA:  64 year old female with history of recent hospital admission for urosepsis. Urinary retention. Low oxygen saturation despite being on 4 L of oxygen. Evaluate for pulmonary embolism. EXAM: CT ANGIOGRAPHY CHEST WITH CONTRAST TECHNIQUE: Multidetector CT imaging of the chest was performed using the standard  protocol during bolus administration of intravenous contrast. Multiplanar CT image reconstructions and MIPs were obtained to evaluate the vascular anatomy. CONTRAST:  61mL OMNIPAQUE IOHEXOL 350 MG/ML SOLN COMPARISON:  Chest CT 04/24/2020. FINDINGS: Cardiovascular: No filling defects within the pulmonary arterial tree to suggest pulmonary embolism. Severe dilatation of the pulmonic trunk (4.8 cm in diameter). Heart size is enlarged with severe right ventricular dilatation. There is no significant pericardial fluid, thickening or pericardial calcification. There is aortic atherosclerosis, as well as atherosclerosis of the great vessels of the mediastinum and the coronary arteries, including calcified atherosclerotic plaque in the left anterior descending coronary artery. Mediastinum/Nodes: No pathologically enlarged mediastinal or hilar lymph nodes. Esophagus is unremarkable in appearance. No axillary lymphadenopathy. Lungs/Pleura: No acute consolidative airspace disease. No pleural effusions. No suspicious appearing pulmonary nodules or masses are noted. Mild diffuse bronchial wall thickening with mild centrilobular emphysema. Upper Abdomen: Unremarkable. Musculoskeletal: There are no aggressive appearing lytic or blastic lesions noted in the visualized portions of the skeleton. Review of the MIP images confirms the above findings. IMPRESSION: 1. No evidence of pulmonary embolism. 2. Cardiomegaly with severe right ventricular dilatation and severe dilatation of the pulmonic trunk. Findings are again suggestive of pulmonary arterial hypertension, likely with chronic right heart strain. 3. Mild diffuse bronchial wall thickening with mild centrilobular emphysema; imaging findings suggestive of underlying COPD. 4. Aortic atherosclerosis, in addition to left anterior descending coronary artery disease. Please note that although the presence of coronary artery calcium documents the presence of coronary artery disease, the  severity of this disease and any potential stenosis cannot be assessed on this non-gated CT examination. Assessment for potential risk factor modification, dietary therapy or pharmacologic therapy may be warranted, if clinically indicated. Aortic  Atherosclerosis (ICD10-I70.0) and Emphysema (ICD10-J43.9). Electronically Signed   By: Trudie Reed M.D.   On: 05/12/2020 04:30   US Venous Img Lower Unilateral Left (DVT)  Result Date: 05/13/2020 CLINICAL DATA:  Left lower extremity pain and edema. History of left-sided pelvic venous stenting extending to the level of the left common femoral vein. Evaluate for DVT. EXAM: LEFT LOWER EXTREMITY VENOUS DOPPLER ULTRASOUND TECHNIQUE: Gray-scale sonography with graded compression, as well as color Doppler and duplex ultrasound were performed to evaluate the lower extremity deep venous systems from the level of the common femoral vein and including the common femoral, femoral, profunda femoral, popliteal and calf veins including the posterior tibial, peroneal and gastrocnemius veins when visible. The superficial great saphenous vein was also interrogated. Spectral Doppler was utilized to evaluate flow at rest and with distal augmentation maneuvers in the common femoral, femoral and popliteal veins. COMPARISON:  CT abdomen pelvis-04/24/2020 FINDINGS: Contralateral Common Femoral Vein: Respiratory phasicity is normal and symmetric with the symptomatic side. No evidence of thrombus. Normal compressibility. Common Femoral Vein: Left common femoral venous stent appears widely patent without evidence thrombus or in-stent stenosis. Saphenofemoral Junction: No evidence of thrombus. Normal compressibility and flow on color Doppler imaging. Profunda Femoral Vein: No evidence of thrombus. Normal compressibility and flow on color Doppler imaging. Femoral Vein: No evidence of thrombus. Normal compressibility, respiratory phasicity and response to augmentation. Popliteal Vein: No  evidence of thrombus. Normal compressibility, respiratory phasicity and response to augmentation. Calf Veins: No evidence of thrombus. Normal compressibility and flow on color Doppler imaging. Superficial Great Saphenous Vein: No evidence of thrombus. Normal compressibility. Venous Reflux:  None. Other Findings:  None. IMPRESSION: 1. No evidence of acute or chronic DVT within the left lower extremity. 2. Venous stent within the left common femoral vein appears widely patent without evidence thrombus or in-stent stenosis. Electronically Signed   By: Simonne Come M.D.   On: 05/13/2020 10:35   DG Chest Port 1 View  Result Date: 05/12/2020 CLINICAL DATA:  History of urinary retention and previous urosepsis EXAM: PORTABLE CHEST 1 VIEW COMPARISON:  04/25/2020 FINDINGS: Cardiac shadow remains enlarged. Overall inspiratory effort is poor. Crowding of the vascular markings is noted. No new focal infiltrate is seen. No bony abnormality is noted. IMPRESSION: Poor inspiratory effort with crowding of the vascular markings. The overall appearance is similar to that seen on the prior exam. No new infiltrate is noted. Electronically Signed   By: Alcide Clever M.D.   On: 05/12/2020 01:10    Assessment and Plan:   Chronic biventricular heart failure: Regina White does not appear grossly volume overloaded on exam, though her body habitus makes evaluation difficult.  It is notable that her weight today is up 3 kg from her admission last month (105.5 kg on 04/28/2020).  Her extremities are warm with normal creatinine and lactate, which argues against cardiogenic shock and low output heart failure.  Maintain net even to slightly negative fluid balance.  As blood pressure normalizes, more aggressive diuresis should be considered.  I agree with holding bisoprolol and Entresto in the setting of hypotension.  Hopefully, these can be gradually restarted over the next day or two.  Hypotension: I suspect this is multifactorial,  including chronic heart failure, medications, and potential recurrent infection.  Vasopressors have been weaned off, though Regina White is now on midodrine.  Maintain net even slightly negative fluid balance as blood pressure stabilizes.  I agree with holding bisoprolol and Entresto.  Continue midodrine for the time  being, the long-term I favor weaning off of this as increased afterload reduction may be detrimental to her chronic heart failure.  Acute on chronic respiratory failure with hypoxia: Patient still requiring additional supplemental oxygen beyond her baseline of 4 L.  CTA of the chest notable for no pulmonary embolism but evidence of chronic pulmonary hypertension and right heart failure.  Continue diuresis, as blood pressure tolerates.  Wean supplemental oxygen, as tolerated. If severe hypoxia persists, [facility].  However, given that pulmonary hypertension is thought to be WHO class II in nature, advanced treatment options are limited beyond optimization of chronic HFrEF.  I agree with palliative care consultation, given that prognosis is guarded in the setting of advanced biventricular failure and bedbound state.  Anuria: Foley in place with some urine output over the last 24 hours (750 mL).  Consider gentle diuresis, as outlined above, when blood pressure tolerates.  History of left lower extremity DVT: Venous duplex without evidence of acute DVT.  Venous state appears patent.  Continue apixaban, though dosing is subtherapeutic per normal guidelines.  Per outside notes, patient is on reduced dose due to history of bleeding.  For questions or updates, please contact [facility] Please consult [email or web address] for contact info under [facility]  Signed, Yvonne Kendallhristopher Solash Tullo, MD  05/13/2020 12:52 PM

## 2020-05-13 NOTE — Consult Note (Signed)
PHARMACY CONSULT NOTE - FOLLOW UP  Pharmacy Consult for Electrolyte Monitoring and Replacement   Recent Labs: Potassium (mmol/L)  Date Value  05/13/2020 4.6   Magnesium (mg/dL)  Date Value  96/29/5284 1.8   Calcium (mg/dL)  Date Value  13/24/4010 8.2 (L)   Albumin (g/dL)  Date Value  27/25/3664 2.6 (L)   Phosphorus (mg/dL)  Date Value  40/34/7425 3.4   Sodium (mmol/L)  Date Value  05/13/2020 141   Corrected Ca: 9.32 mg/dL  Assessment: 64 y/o female with a complex medical history presenting with acute on chronic hypoxic respiratory failure/acute COPD exacerbation, cardiogenic shock, severe erythematous rash and 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    Goal of Therapy:  Potassium 4.0 - 5.1 mmol/L Magnesium 2.0 - 2.4 mg/dL All Other Electrolytes WNL  Plan:   2 grams IV magnesium sulfate x 1  Re-check electrolytes in am  Lowella Bandy ,PharmD Clinical Pharmacist 05/13/2020 7:18 AM

## 2020-05-13 NOTE — Progress Notes (Signed)
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    HPI: This is a 64 y/o female with a complex medical history who was recently hospitalized for Streptococcus agalactiae bacteremia from UTI and cellulitis of the posterior thigh, back and buttocks, acute on chronic hypoxic respiratory failure, acute on chronic heart failure and severe septic shock.  PCCM is being called to see patient for acute on chronic hypoxic respiratory failure and shock.  Per EMS records, EMS was called because patient had difficulty urinating.  When EMS arrived, patient was hypoxic with SPO2 of 68% on 4 L.  She was placed on 10 L and transferred to the ED.  Upon arrival in the ED, patient was hypotensive with a blood pressure of 83/61.  Her SPO2 had improved to 88% on 12 L nasal cannula.  She was placed on a nonrebreather with some improvement in her oxygenation.  A Foley catheter was inserted and currently draining clear urine.  She was also noted to have an extensive erythematous rash that extends from her mid to lower back and buttocks. Her ED work-up was unremarkable except for severe cardiomegaly with severe right ventricular dilatation and dilatation of the pulmonary trunk on CTA chest.  She remained severely hypoxic and hypotensive hands she is being admitted to the ICU for further management. 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 She is awake and denies any chest pain, palpitations, dyspnea, nausea, vomiting and headache.  She reports taking all her medications at  home as prescribed.   SIGNIFICANT EVENTS: 04/29/2020: Discharged following hospitalization for septic shock, acute hypoxic respiratory failure and severe sepsis secondary to cellulitis and UTI 05/12/2019: Readmitted with hypotension and acute on chronic hypoxic respiratory failure   Past Medical History:  Diagnosis Date  . Asthma   . CHF (congestive heart failure) (HCC)   . Chronic back pain   . COPD (chronic obstructive pulmonary disease) (HCC)   . Pulmonary hypertension (HCC)    Past Surgical History:  Procedure Laterality Date  . BACK SURGERY     No current facility-administered medications on file prior to encounter.   Current Outpatient Medications on File Prior to Encounter  Medication Sig  . acidophilus (RISAQUAD) CAPS capsule Take 1 capsule by mouth daily.  Marland Kitchen albuterol (VENTOLIN HFA) 108 (90 Base) MCG/ACT inhaler Inhale 2 puffs into the lungs every 6 (six) hours as needed for wheezing or shortness of breath.  Marland Kitchen apixaban (ELIQUIS) 2.5 MG TABS tablet Take 1 tablet (2.5 mg total) by mouth 2 (two) times daily.  . bisoprolol (ZEBETA) 5 MG tablet Take 5 mg by mouth daily.  . Cholecalciferol (VITAMIN D) 50 MCG (2000 UT) tablet Take 2,000 Units by mouth daily.  Marland Kitchen ENTRESTO 24-26 MG Take 1 tablet by mouth 2 (two) times daily.  . famotidine (PEPCID) 20 MG tablet Take 1 tablet (20 mg total) by mouth 2 (two) times daily. (Patient taking differently: Take 20 mg by mouth daily. )  . Fluticasone-Salmeterol (ADVAIR) 500-50 MCG/DOSE AEPB Inhale 1 puff into the lungs 2 (two) times daily.  Marland Kitchen guaiFENesin-dextromethorphan (ROBITUSSIN DM) 100-10 MG/5ML syrup Take 5 mLs  by mouth every 4 (four) hours as needed for cough.  Marland Kitchen HYDROcodone-acetaminophen (NORCO/VICODIN) 5-325 MG tablet Take 1 tablet by mouth every 8 (eight) hours as needed for severe pain.  Marland Kitchen levothyroxine (SYNTHROID) 200 MCG tablet Take 1 tablet (200 mcg total) by mouth daily at 6 (six) AM.  . montelukast (SINGULAIR) 10 MG tablet Take 1  tablet (10 mg total) by mouth at bedtime.  . NON FORMULARY by Intrathecal Infusion route continuous. MORPHINE / BUPIVACAINE INTRATHECAL PUMP   . polyethylene glycol (MIRALAX / GLYCOLAX) 17 g packet Take 17 g by mouth daily as needed (constipation).  . traZODone (DESYREL) 50 MG tablet Take 150 mg by mouth at bedtime.     Allergies Allergies  Allergen Reactions  . Penicillins Anaphylaxis    Breathing problems Did it involve swelling of the face/tongue/throat, SOB, or low BP? Yes Did it involve sudden or severe rash/hives, skin peeling, or any reaction on the inside of your mouth or nose? Unknown Did you need to seek medical attention at a hospital or doctor's office? Unknown When did it last happen?unknown If all above answers are "NO", may proceed with cephalosporin use. Patient tolerated Ceftriaxone September 2020  . Erythromycin Nausea And Vomiting and Rash    STOMACH PAIN  . Levofloxacin Other (See Comments)    Muscle aches  . Lidocaine Rash  . Clindamycin Other (See Comments)    Recurrent C diff  . Levothyroxine Other (See Comments)    Vaginal bleeding Reaction to generic (pt currently takes name brand Synthroid)  . Tiotropium Other (See Comments)    Coughing up blood  . Doxycycline Nausea And Vomiting  . Sulfa Antibiotics Rash    Family History Family History  Problem Relation Age of Onset  . Hypertension Mother   . Liver cancer Father    Social History  reports that she quit smoking about 20 years ago. Her smoking use included cigarettes. She has a 30.00 pack-year smoking history. She has never used smokeless tobacco. She reports that she does not drink alcohol and does not use drugs.  Review Of Systems:   Constitutional: Negative for fever and chills but reports a feeling of generalized malaise HENT: Negative for congestion and rhinorrhea.  Eyes: Negative for redness and visual disturbance.  Respiratory: Negative for shortness of breath and wheezing but  positive for a wet cough.  Cardiovascular: Negative for chest pain and palpitations positive for left lower extremity edema Gastrointestinal: Negative  for nausea , vomiting and abdominal pain and  Loose stools Genitourinary: Negative for dysuria and urgency.  Endocrine: Denies polyuria, polyphagia and heat intolerance Musculoskeletal: Negative for myalgias and arthralgias.  Skin: Negative for pallor and wound.  Neurological: Negative for dizziness and headaches   VITAL SIGNS: BP 129/73   Pulse 66   Temp (!) 97.5 F (36.4 C)   Resp 17   Ht 5\' 5"  (1.651 m)   Wt 108.8 kg   LMP  (LMP Unknown)   SpO2 98%   BMI 39.91 kg/m   HEMODYNAMICS:    VENTILATOR SETTINGS: FiO2 (%):  [60 %] 60 %  INTAKE / OUTPUT: I/O last 3 completed shifts: In: 714.8 [I.V.:214.7; IV Piggyback:500.1] Out: 1050 [Urine:1050]  PHYSICAL EXAMINATION: General: Obese, chronically ill looking HEENT: Normocephalic and atraumatic, PERRLA, trachea midline, mild JVD Neuro: Alert and oriented x3, moves all extremities, no focal deficits Cardiovascular: Apical pulse regular, S1-S2, +3 edema LLE, +1 edema in RLE, no MRG Lungs: Normal WOB, bilateral breath sounds, no wheezes or rhonchi Abdomen:  normal BS X4, no organomegaly  Musculoskeletal:  Limited ROM Skin: extensive weeping erythematous rash that extends from the mid upper back to the buttocks and posterior thigh; left >>>right.     LABS:  BMET Recent Labs  Lab 05/12/20 0136 05/12/20 0605 05/13/20 0612  NA 143  --  141  K 4.6  --  4.6  CL 111  --  110  CO2 23  --  24  BUN 37*  --  31*  CREATININE 1.23* 1.23* 0.85  GLUCOSE 101*  --  143*    Electrolytes Recent Labs  Lab 05/12/20 0136 05/13/20 0612  CALCIUM 7.8* 8.2*  MG  --  1.8  PHOS  --  3.4    CBC Recent Labs  Lab 05/12/20 0056 05/12/20 0846 05/13/20 0612  WBC 6.7 8.2 5.0  HGB 12.2 12.7 11.3*  HCT 40.8 41.8 34.5*  PLT 171 181 168    Coag's No results for input(s): APTT, INR  in the last 168 hours.  Sepsis Markers Recent Labs  Lab 05/12/20 0056 05/12/20 0136  LATICACIDVEN 1.3  --   PROCALCITON  --  0.10    ABG Recent Labs  Lab 05/12/20 0605  PHART 7.29*  PCO2ART 49*  PO2ART 52*    Liver Enzymes Recent Labs  Lab 05/12/20 0136  AST 9*  ALT 9  ALKPHOS 47  BILITOT 0.5  ALBUMIN 2.6*    Cardiac Enzymes No results for input(s): TROPONINI, PROBNP in the last 168 hours.  Glucose Recent Labs  Lab 05/12/20 0849  GLUCAP 100*    Imaging No results found.     STUDIES:  2D echo from 04/25/2020 shows left ventricular ejection fraction of 35 to 40%, decreased right ventricular systolic function, dilated right ventricle, severely elevated pulmonary artery pressure, and tricuspid valve regurgitation  CULTURES: None; Streptococcus agalactiae 04/24/2020  ANTIBIOTICS: Oral vancomycin   LINES/TUBES: Peripheral IVs Foley catheter  DISCUSSION: 64 year old female presenting with acute on chronic respiratory failure secondary to acute COPD exacerbation and severe pulmonary hypertension requiring BiPAP, cardiogenic shock from severe cardiomyopathy with diastolic and systolic heart failure and severe erythematous rash likely due to humidity and immobility.  Overall prognosis is poor.  ASSESSMENT / PLAN:  PULMONARY A: Acute on chronic hypoxic respiratory failure on oxygen at 4 L nasal cannula at home Severe pulmonary hypertension Acute on chronic COPD exacerbation P:   Respiratory support with BiPAP, or high flow nasal cannula or nonrebreather mask and titrate to home dose of 4 L as tolerated Patient is at high risk for intubation and is currently a full code Nebulized bronchodilators and inhaled steroids ABGs as needed Transitional care consult as patient will require more skilled nursing than can be provided at home  CARDIOVASCULAR A:  Cardiogenic shock due to a combination of decompensated heart failure and severe pulmonary hypertension    Chronic systolic and diastolic heart failure-last ejection fraction was 35 to 40% P:  Norepinephrine infusion and titrate to maintain mean arterial blood pressure greater than 65 Cardiology consult Palliative care consult Resume home blood pressure medications once blood pressure improves Cycle cardiac enzymes  RENAL A:   Acute kidney injury P:   Trend creatinine Avoid nephrotoxic drugs Gentle IV hydration  GASTROINTESTINAL A:   C. difficile colitis P:   Continue oral vancomycin  HEMATOLOGIC A:   Left lower extremity DVT-CTA negative for PE P:  Continue Eliquis  INFECTIOUS A:   Extensive cellulitis of the upper back buttocks and left thigh-no signs of acute infection,  procalcitonin negative and lactic acid 1.3 P:   Monitor fever curve and trend procalcitonin and lactic acid levels  Integumentary A:   Extensive erythematous rash P:   Wound care consult Surgery consult - patient declined Moisture control   Best Practice: Code Status: Full code Diet: Heart healthy GI prophylaxis: Protonix VTE prophylaxis: Already on apixaban  FAMILY  - Updates: No family at bedside.  Would update when available   NB: This document was prepared using Dragon voice recognition software and may include unintentional dictation errors.  Critical care provider statement:    Critical care time (minutes):  33   Critical care time was exclusive of:  Separately billable procedures and  treating other patients   Critical care was necessary to treat or prevent imminent or  life-threatening deterioration of the following conditions:  chronic pain syndrome, acute on chronic chf exacerbation, AECOPD, skin rash chronically    Critical care was time spent personally by me on the following  activities:  Development of treatment plan with patient or surrogate,  discussions with consultants, evaluation of patient's response to  treatment, examination of patient, obtaining history from  patient or  surrogate, ordering and performing treatments and interventions, ordering  and review of laboratory studies and re-evaluation of patient's condition   I assumed direction of critical care for this patient from another  provider in my specialty: no     05/13/2020, 10:09 AM    Vida RiggerFuad Deavion Dobbs, M.D.  Pulmonary & Critical Care Medicine  [facility]

## 2020-05-13 NOTE — Progress Notes (Signed)
General surgery was added to this patient's treatment team and it appears that a consult order was placed, however no communication ever occurred between the primary/admitting service and general surgery regarding a request for consultation.  Upon review of the patient's chart, it appears that the clinical question for general surgery was regarding debridement of the patient's rash.  There is no role for surgery in this patient's case. General surgery intervention declined by patient this AM, as well.

## 2020-05-14 ENCOUNTER — Inpatient Hospital Stay: Payer: Medicare Other

## 2020-05-14 DIAGNOSIS — I950 Idiopathic hypotension: Secondary | ICD-10-CM

## 2020-05-14 DIAGNOSIS — R0602 Shortness of breath: Secondary | ICD-10-CM

## 2020-05-14 DIAGNOSIS — I509 Heart failure, unspecified: Secondary | ICD-10-CM

## 2020-05-14 DIAGNOSIS — I5020 Unspecified systolic (congestive) heart failure: Secondary | ICD-10-CM

## 2020-05-14 LAB — CBC
HCT: 35.7 % — ABNORMAL LOW (ref 36.0–46.0)
Hemoglobin: 11.4 g/dL — ABNORMAL LOW (ref 12.0–15.0)
MCH: 28.6 pg (ref 26.0–34.0)
MCHC: 31.9 g/dL (ref 30.0–36.0)
MCV: 89.7 fL (ref 80.0–100.0)
Platelets: 194 10*3/uL (ref 150–400)
RBC: 3.98 MIL/uL (ref 3.87–5.11)
RDW: 18.5 % — ABNORMAL HIGH (ref 11.5–15.5)
WBC: 5.7 10*3/uL (ref 4.0–10.5)
nRBC: 0 % (ref 0.0–0.2)

## 2020-05-14 LAB — RENAL FUNCTION PANEL
Albumin: 3 g/dL — ABNORMAL LOW (ref 3.5–5.0)
Anion gap: 9 (ref 5–15)
BUN: 33 mg/dL — ABNORMAL HIGH (ref 8–23)
CO2: 27 mmol/L (ref 22–32)
Calcium: 8.2 mg/dL — ABNORMAL LOW (ref 8.9–10.3)
Chloride: 104 mmol/L (ref 98–111)
Creatinine, Ser: 0.93 mg/dL (ref 0.44–1.00)
GFR calc Af Amer: >60 mL/min (ref >60)
GFR calc non Af Amer: >60 mL/min (ref >60)
Glucose, Bld: 134 mg/dL — ABNORMAL HIGH (ref 70–99)
Phosphorus: 3.5 mg/dL (ref 2.5–4.6)
Potassium: 3.5 mmol/L (ref 3.5–5.1)
Sodium: 140 mmol/L (ref 135–145)

## 2020-05-14 LAB — MAGNESIUM: Magnesium: 1.9 mg/dL (ref 1.7–2.4)

## 2020-05-14 MED ORDER — POTASSIUM CHLORIDE CRYS ER 20 MEQ PO TBCR
40.0000 meq | EXTENDED_RELEASE_TABLET | Freq: Once | ORAL | Status: AC
  Administered 2020-05-14: 40 meq via ORAL
  Filled 2020-05-14: qty 2

## 2020-05-14 MED ORDER — FUROSEMIDE 10 MG/ML IJ SOLN
40.0000 mg | Freq: Every day | INTRAMUSCULAR | Status: DC
  Administered 2020-05-14 – 2020-05-18 (×5): 40 mg via INTRAVENOUS
  Filled 2020-05-14 (×5): qty 4

## 2020-05-14 NOTE — Consult Note (Addendum)
PHARMACY CONSULT NOTE  Pharmacy Consult for Electrolyte Monitoring and Replacement   Recent Labs: Potassium (mmol/L)  Date Value  05/14/2020 3.5   Magnesium (mg/dL)  Date Value  27/25/3664 1.9   Calcium (mg/dL)  Date Value  40/34/7425 8.2 (L)   Albumin (g/dL)  Date Value  95/63/8756 3.0 (L)   Phosphorus (mg/dL)  Date Value  43/32/9518 3.5   Sodium (mmol/L)  Date Value  05/14/2020 140   Corrected Ca: 9.32 mg/dL  Assessment: 64 y/o female with a complex medical history presenting with acute on chronic hypoxic respiratory failure/acute COPD exacerbation, cardiogenic shock, severe erythematous rash and 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  She is currently on IV Lasix 40 mg daily  Goal of Therapy:  Potassium 4.0 - 5.1 mmol/L Magnesium 2.0 - 2.4 mg/dL All Other Electrolytes WNL  Plan:   Oral KCl 40 mEq x1   Magnesium borderline and trending up: no replacement warranted for today  Re-check electrolytes in am 7/8  Lowella Bandy ,PharmD Clinical Pharmacist 05/14/2020 6:59 AM

## 2020-05-14 NOTE — Progress Notes (Signed)
CRITICAL CARE PROGRESS NOTE    Name: Regina White MRN: [id number] DOB: [birth year]     LOS: 2   SUBJECTIVE FINDINGS & SIGNIFICANT EVENTS    Patient description:  HPI: This is a 64 y/o female with a complex medical history who was recently hospitalized for Streptococcus agalactiae bacteremia from UTI and cellulitis of the posterior thigh, back and buttocks, acute on chronic hypoxic respiratory failure, acute on chronic heart failure and severe septic shock.  PCCM is being called to see patient for acute on chronic hypoxic respiratory failure and shock.  Per EMS records, EMS was called because patient had difficulty urinating.  When EMS arrived, patient was hypoxic with SPO2 of 68% on 4 L.  She was placed on 10 L and transferred to the ED.  Upon arrival in the ED, patient was hypotensive with a blood pressure of 83/61.  Her SPO2 had improved to 88% on 12 L nasal cannula.  She was placed on a nonrebreather with some improvement in her oxygenation.  A Foley catheter was inserted and currently draining clear urine.  She was also noted to have an extensive erythematous rash that extends from her mid to lower back and buttocks. Her ED work-up was unremarkable except for severe cardiomegaly with severe right ventricular dilatation and dilatation of the pulmonary trunk on CTA chest.  She remained severely hypoxic and hypotensive hands she is being admitted to the ICU for further management. 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 She is awake and denies any chest pain, palpitations, dyspnea, nausea, vomiting and headache.  She reports taking all her medications at home as prescribed.   SIGNIFICANT EVENTS: 04/29/2020: Discharged following  hospitalization for septic shock, acute hypoxic respiratory failure and severe sepsis secondary to cellulitis and UTI 05/12/2019: Readmitted with hypotension and acute on chronic hypoxic respiratory failure 05/14/20- patient continues to ask for more narcotics, she already has fentanyl, morphine and dilaudid and still asking for more opiates, im concerned for safety with respiratory drive. 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  Lines/tubes : Urethral Catheter Tory EmeraldShannon Martin, RN Temperature probe 16 Fr. (Active)  Indication for Insertion or Continuance of Catheter Therapy based on hourly urine output monitoring and documentation for critical condition (NOT STRICT I&O) 05/13/20 1915  Site Assessment Clean;Intact 05/13/20 2000  Catheter Maintenance Bag below level of bladder;Catheter secured;Drainage bag/tubing not touching floor;No dependent loops 05/13/20 2000  Collection Container Standard drainage bag 05/13/20 2000  Securement Method Securing device (Describe) 05/13/20 2000  Urinary Catheter Interventions (if applicable) Unclamped 05/13/20 2000  Output (mL) 200 mL 05/14/20 0700    Microbiology/Sepsis markers: Results for orders placed or performed during the hospital encounter of 05/11/20  SARS Coronavirus 2 by RT PCR (hospital order[facility]) Nasopharyngeal Nasopharyngeal Swab     Status: None   Collection Time: 05/12/20  5:45 AM   Specimen: Nasopharyngeal Swab  Result Value Ref Range Status   SARS Coronavirus 2 NEGATIVE NEGATIVE Final    Comment: (NOTE) SARS-CoV-2 target nucleic acids are NOT DETECTED.  The SARS-CoV-2 RNA is generally detectable in upper and lower respiratory specimens during the acute phase of infection. The lowest concentration of SARS-CoV-2 viral copies this assay can detect is 250 copies / mL. A negative result does not preclude SARS-CoV-2 infection and should not  be used  as the sole basis for treatment or other patient management decisions.  A negative result may occur with improper specimen collection / handling, submission of specimen other than nasopharyngeal swab, presence of viral mutation(s) within the areas targeted by this assay, and inadequate number of viral copies (<250 copies / mL). A negative result must be combined with clinical observations, patient history, and epidemiological information.  Fact Sheet for Patients:   BoilerBrush.com.cy  Fact Sheet for Healthcare Providers: https://pope.com/  This test is not yet approved or  cleared by the [place] FDA and has been authorized for detection and/or diagnosis of SARS-CoV-2 by FDA under an Emergency Use Authorization (EUA).  This EUA will remain in effect (meaning this test can be used) for the duration of the COVID-19 declaration under Section 564(b)(1) of the Act, 21 U.S.C. section 360bbb-3(b)(1), unless the authorization is terminated or revoked sooner.  Performed at [facility], [address] [place], [place] [address]     Anti-infectives:  Anti-infectives (From admission, onward)   Start     Dose/Rate Route Frequency Ordered Stop   05/12/20 1600  vancomycin (VANCOCIN) 125 MG capsule 125 mg     Discontinue     125 mg Oral 3 times daily 05/12/20 1408     05/12/20 1400  vancomycin (VANCOCIN) 50 mg/mL oral solution 125 mg  Status:  Discontinued        125 mg Oral Every 8 hours 05/12/20 1236 05/12/20 1408   05/12/20 1000  vancomycin (VANCOCIN) 125 MG capsule 125 mg  Status:  Discontinued        125 mg Oral 3 times daily 05/12/20 0549 05/12/20 1236       Consults: Treatment Team:  Rosalene Billings, MD     Tests / Events: TTE    PAST MEDICAL HISTORY   Past Medical History:  Diagnosis Date  . Asthma   . CHF (congestive heart failure) (HCC)   . Chronic back pain   . COPD  (chronic obstructive pulmonary disease) (HCC)   . Pulmonary hypertension (HCC)      SURGICAL HISTORY   Past Surgical History:  Procedure Laterality Date  . BACK SURGERY       FAMILY HISTORY   Family History  Problem Relation Age of Onset  . Hypertension Mother   . Liver cancer Father      SOCIAL HISTORY   Social History   Tobacco Use  . Smoking status: Former Smoker    Packs/day: 1.00    Years: 30.00    Pack years: 30.00    Types: Cigarettes    Quit date: 06/09/1999    Years since quitting: 20.9  . Smokeless tobacco: Never Used  Vaping Use  . Vaping Use: Never used  Substance Use Topics  . Alcohol use: No  . Drug use: Never     MEDICATIONS   Current Medication:  Current Facility-Administered Medications:  .  0.9 %  sodium chloride infusion, 250 mL, Intravenous, Continuous, Tukov-Yual, Magdalene S, NP .  acetaminophen (TYLENOL) tablet 650 mg, 650 mg, Oral, Q4H PRN, Tukov-Yual, Magdalene S, NP, 650 mg at 05/12/20 1042 .  acidophilus (RISAQUAD) capsule 1 capsule, 1 capsule, Oral, Daily, Tukov-Yual, Magdalene S, NP, 1 capsule at 05/13/20 1113 .  apixaban (ELIQUIS) tablet 2.5 mg, 2.5 mg, Oral, BID, Tukov-Yual, Magdalene S, NP, 2.5 mg at 05/13/20 2104 .  budesonide (PULMICORT) nebulizer solution 0.5 mg, 0.5 mg, Nebulization, BID, Tukov-Yual, Magdalene S, NP, 0.5 mg at 05/14/20 0742 .  Chlorhexidine Gluconate Cloth 2 % PADS 6 each, 6 each, Topical, Daily, Vida Rigger, MD, 6 each at 05/13/20 2229 .  cholecalciferol (VITAMIN D) tablet 2,000 Units, 2,000 Units, Oral, Daily, Tukov-Yual, Magdalene S, NP, 2,000 Units at 05/13/20 1030 .  docusate sodium (COLACE) capsule 100 mg, 100 mg, Oral, BID PRN, Tukov-Yual, Magdalene S, NP .  fentaNYL (SUBLIMAZE) injection 12.5 mcg, 12.5 mcg, Intravenous, Q4H PRN, Eugenie Norrie, NP, 12.5 mcg at 05/13/20 0844 .  furosemide (LASIX) injection 40 mg, 40 mg, Intravenous, Daily, Vida Rigger, MD .  Gerhardt's butt cream, , Topical,  TID, Vida Rigger, MD, Given at 05/13/20 2243 .  guaiFENesin-dextromethorphan (ROBITUSSIN DM) 100-10 MG/5ML syrup 5 mL, 5 mL, Oral, Q4H PRN, Tukov-Yual, Magdalene S, NP .  HYDROcodone-acetaminophen (NORCO/VICODIN) 5-325 MG per tablet 1-2 tablet, 1-2 tablet, Oral, Q6H PRN, Eugenie Norrie, NP .  HYDROmorphone (DILAUDID) injection 1 mg, 1 mg, Intravenous, Q4H PRN, Vida Rigger, MD, 1 mg at 05/13/20 0926 .  ipratropium-albuterol (DUONEB) 0.5-2.5 (3) MG/3ML nebulizer solution 3 mL, 3 mL, Nebulization, Q6H, Tukov-Yual, Magdalene S, NP, 3 mL at 05/14/20 0742 .  levothyroxine (SYNTHROID) tablet 200 mcg, 200 mcg, Oral, Q0600, Tukov-Yual, Magdalene S, NP, 200 mcg at 05/14/20 0704 .  midodrine (PROAMATINE) tablet 10 mg, 10 mg, Oral, TID WC, Jennefer Kopp, MD, 10 mg at 05/13/20 1738 .  montelukast (SINGULAIR) tablet 10 mg, 10 mg, Oral, QHS, Tukov-Yual, Magdalene S, NP, 10 mg at 05/13/20 2104 .  pantoprazole (PROTONIX) injection 40 mg, 40 mg, Intravenous, QHS, Tukov-Yual, Magdalene S, NP, 40 mg at 05/13/20 2105 .  polyethylene glycol (MIRALAX / GLYCOLAX) packet 17 g, 17 g, Oral, Daily PRN, Tukov-Yual, Magdalene S, NP .  potassium chloride SA (KLOR-CON) CR tablet 40 mEq, 40 mEq, Oral, Once, Lowella Bandy, RPH .  silver sulfADIAZINE (SILVADENE) 1 % cream, , Topical, BID, Tukov-Yual, Magdalene S, NP, 1 application at 05/13/20 2243 .  sodium chloride flush (NS) 0.9 % injection 10-40 mL, 10-40 mL, Intracatheter, Q12H, Loleta Rose, MD, 10 mL at 05/13/20 2229 .  sodium chloride flush (NS) 0.9 % injection 10-40 mL, 10-40 mL, Intracatheter, PRN, Loleta Rose, MD .  traZODone (DESYREL) tablet 150 mg, 150 mg, Oral, QHS, Tukov-Yual, Magdalene S, NP, 150 mg at 05/13/20 2104 .  vancomycin (VANCOCIN) 125 MG capsule 125 mg, 125 mg, Oral, TID, Vida Rigger, MD, 125 mg at 05/14/20 0116    ALLERGIES   Penicillins, Erythromycin, Levofloxacin, Lidocaine, Clindamycin, Levothyroxine, Tiotropium, Doxycycline, and  Sulfa antibiotics    REVIEW OF SYSTEMS     10 point ROS done and is negative except for generalized pain and pain at left hip where patient states she has arthritis.  PHYSICAL EXAMINATION   Vital Signs: Temp:  [97.9 F (36.6 C)-99 F (37.2 C)] 97.9 F (36.6 C) (07/07 0800) Pulse Rate:  [48-85] 63 (07/07 0800) Resp:  [13-22] 19 (07/07 0800) BP: (76-137)/(46-120) 132/109 (07/07 0800) SpO2:  [88 %-99 %] 94 % (07/07 0800)  GENERAL:chronically ill appearing, crying during interview intermittently HEAD: Normocephalic, atraumatic.  EYES: Pupils equal, round, reactive to light.  No scleral icterus.  MOUTH: Moist mucosal membrane. NECK: Supple. No thyromegaly. No nodules. No JVD.  PULMONARY: crackles at bases bilaterally CARDIOVASCULAR: S1 and S2. Regular rate and rhythm. No murmurs, rubs, or gallops.  GASTROINTESTINAL: Soft, nontender, non-distended. No masses. Positive bowel sounds. No hepatosplenomegaly.  MUSCULOSKELETAL: No swelling, clubbing, or edema. Left him and LE pain and tenderness. Rash on back image below NEUROLOGIC: Mild distress due to  acute illness.   SKIN:intact,warm,dry   PERTINENT DATA     Infusions: . sodium chloride     Scheduled Medications: . acidophilus  1 capsule Oral Daily  . apixaban  2.5 mg Oral BID  . budesonide (PULMICORT) nebulizer solution  0.5 mg Nebulization BID  . Chlorhexidine Gluconate Cloth  6 each Topical Daily  . cholecalciferol  2,000 Units Oral Daily  . furosemide  40 mg Intravenous Daily  . Gerhardt's butt cream   Topical TID  . ipratropium-albuterol  3 mL Nebulization Q6H  . levothyroxine  200 mcg Oral Q0600  . midodrine  10 mg Oral TID WC  . montelukast  10 mg Oral QHS  . pantoprazole (PROTONIX) IV  40 mg Intravenous QHS  . potassium chloride  40 mEq Oral Once  . silver sulfADIAZINE   Topical BID  . sodium chloride flush  10-40 mL Intracatheter Q12H  . traZODone  150 mg Oral QHS  . vancomycin  125 mg Oral TID   PRN  Medications: acetaminophen, docusate sodium, fentaNYL (SUBLIMAZE) injection, guaiFENesin-dextromethorphan, HYDROcodone-acetaminophen, HYDROmorphone (DILAUDID) injection, polyethylene glycol, sodium chloride flush Hemodynamic parameters:   Intake/Output: 07/06 0701 - 07/07 0700 In: -  Out: 3795 [Urine:3795]  Ventilator  Settings:    LAB RESULTS:  Basic Metabolic Panel: Recent Labs  Lab 05/12/20 0136 05/12/20 0136 05/12/20 0605 05/13/20 0612 05/14/20 0428  NA 143  --   --  141 140  K 4.6   < >  --  4.6 3.5  CL 111  --   --  110 104  CO2 23  --   --  24 27  GLUCOSE 101*  --   --  143* 134*  BUN 37*  --   --  31* 33*  CREATININE 1.23*  --  1.23* 0.85 0.93  CALCIUM 7.8*  --   --  8.2* 8.2*  MG  --   --   --  1.8 1.9  PHOS  --   --   --  3.4 3.5   < > = values in this interval not displayed.   Liver Function Tests: Recent Labs  Lab 05/12/20 0136 05/14/20 0428  AST 9*  --   ALT 9  --   ALKPHOS 47  --   BILITOT 0.5  --   PROT 5.5*  --   ALBUMIN 2.6* 3.0*   No results for input(s): LIPASE, AMYLASE in the last 168 hours. No results for input(s): AMMONIA in the last 168 hours. CBC: Recent Labs  Lab 05/12/20 0056 05/12/20 0846 05/13/20 0612 05/14/20 0428  WBC 6.7 8.2 5.0 5.7  NEUTROABS 4.8  --   --   --   HGB 12.2 12.7 11.3* 11.4*  HCT 40.8 41.8 34.5* 35.7*  MCV 96.0 94.4 89.4 89.7  PLT 171 181 168 194   Cardiac Enzymes: No results for input(s): CKTOTAL, CKMB, CKMBINDEX, TROPONINI in the last 168 hours. BNP: Invalid input(s): POCBNP CBG: Recent Labs  Lab 05/12/20 0849  GLUCAP 100*       IMAGING RESULTS:  Imaging: US Venous Img Lower Unilateral Left (DVT)  Result Date: 05/13/2020 CLINICAL DATA:  Left lower extremity pain and edema. History of left-sided pelvic venous stenting extending to the level of the left common femoral vein. Evaluate for DVT. EXAM: LEFT LOWER EXTREMITY VENOUS DOPPLER ULTRASOUND TECHNIQUE: Gray-scale sonography with graded  compression, as well as color Doppler and duplex ultrasound were performed to evaluate the lower extremity deep venous systems from the level of the  common femoral vein and including the common femoral, femoral, profunda femoral, popliteal and calf veins including the posterior tibial, peroneal and gastrocnemius veins when visible. The superficial great saphenous vein was also interrogated. Spectral Doppler was utilized to evaluate flow at rest and with distal augmentation maneuvers in the common femoral, femoral and popliteal veins. COMPARISON:  CT abdomen pelvis-04/24/2020 FINDINGS: Contralateral Common Femoral Vein: Respiratory phasicity is normal and symmetric with the symptomatic side. No evidence of thrombus. Normal compressibility. Common Femoral Vein: Left common femoral venous stent appears widely patent without evidence thrombus or in-stent stenosis. Saphenofemoral Junction: No evidence of thrombus. Normal compressibility and flow on color Doppler imaging. Profunda Femoral Vein: No evidence of thrombus. Normal compressibility and flow on color Doppler imaging. Femoral Vein: No evidence of thrombus. Normal compressibility, respiratory phasicity and response to augmentation. Popliteal Vein: No evidence of thrombus. Normal compressibility, respiratory phasicity and response to augmentation. Calf Veins: No evidence of thrombus. Normal compressibility and flow on color Doppler imaging. Superficial Great Saphenous Vein: No evidence of thrombus. Normal compressibility. Venous Reflux:  None. Other Findings:  None. IMPRESSION: 1. No evidence of acute or chronic DVT within the left lower extremity. 2. Venous stent within the left common femoral vein appears widely patent without evidence thrombus or in-stent stenosis. Electronically Signed   By: Simonne Come M.D.   On: 05/13/2020 10:35   @PROBHOSP @ Venous Img Lower Unilateral Left (DVT)  Result Date: 05/13/2020 CLINICAL DATA:  Left lower extremity pain and  edema. History of left-sided pelvic venous stenting extending to the level of the left common femoral vein. Evaluate for DVT. EXAM: LEFT LOWER EXTREMITY VENOUS DOPPLER ULTRASOUND TECHNIQUE: Gray-scale sonography with graded compression, as well as color Doppler and duplex ultrasound were performed to evaluate the lower extremity deep venous systems from the level of the common femoral vein and including the common femoral, femoral, profunda femoral, popliteal and calf veins including the posterior tibial, peroneal and gastrocnemius veins when visible. The superficial great saphenous vein was also interrogated. Spectral Doppler was utilized to evaluate flow at rest and with distal augmentation maneuvers in the common femoral, femoral and popliteal veins. COMPARISON:  CT abdomen pelvis-04/24/2020 FINDINGS: Contralateral Common Femoral Vein: Respiratory phasicity is normal and symmetric with the symptomatic side. No evidence of thrombus. Normal compressibility. Common Femoral Vein: Left common femoral venous stent appears widely patent without evidence thrombus or in-stent stenosis. Saphenofemoral Junction: No evidence of thrombus. Normal compressibility and flow on color Doppler imaging. Profunda Femoral Vein: No evidence of thrombus. Normal compressibility and flow on color Doppler imaging. Femoral Vein: No evidence of thrombus. Normal compressibility, respiratory phasicity and response to augmentation. Popliteal Vein: No evidence of thrombus. Normal compressibility, respiratory phasicity and response to augmentation. Calf Veins: No evidence of thrombus. Normal compressibility and flow on color Doppler imaging. Superficial Great Saphenous Vein: No evidence of thrombus. Normal compressibility. Venous Reflux:  None. Other Findings:  None. IMPRESSION: 1. No evidence of acute or chronic DVT within the left lower extremity. 2. Venous stent within the left common femoral vein appears widely patent without evidence thrombus  or in-stent stenosis. Electronically Signed   By: 04/26/2020 M.D.   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  - patient will follow up on outpatient with [facility]  - ortho consult for any additional recommendations - patient in severe pain on high dose narcotics  Skin rash with excoriation   - patient had episodes of laying down in urine at home    - skin care /wound care   ID -continue IV abx as prescibed -follow up cultures  GI/Nutrition GI PROPHYLAXIS as indicated DIET-->TF's as tolerated Constipation protocol as indicated  ENDO - ICU hypoglycemic\Hyperglycemia protocol -check FSBS per protocol   ELECTROLYTES -follow labs as needed -replace as needed -pharmacy consultation   DVT/GI PRX ordered -SCDs  TRANSFUSIONS AS NEEDED MONITOR FSBS ASSESS the need for LABS as needed   Critical care provider statement:    Critical care time (minutes):  33   Critical care time was exclusive of:  Separately billable procedures and treating other patients   Critical care was necessary to treat or prevent imminent or life-threatening deterioration of the following conditions:  acute on chronic hypoxemic respiratory failure, CHF, obesity, left hip arthritis   Critical care was time spent personally by me on the following activities:  Development of treatment plan with patient or surrogate, discussions with consultants, evaluation of patient's response to treatment, examination of patient, obtaining history from patient  or surrogate, ordering and performing treatments and interventions, ordering and review of laboratory studies and re-evaluation of patient's condition.  I assumed direction of critical care for this patient from another provider in my specialty: no    This document was prepared using Dragon voice recognition software and may include unintentional dictation errors.    Vida Rigger, M.D.  Division of Pulmonary & Critical Care Medicine  [facility]

## 2020-05-14 NOTE — Progress Notes (Signed)
Patient remains AOx4.  She is on 5L [employer] saturating in the low 90's.  She has been asymptomatic of hypoxemia for the shift.  Administered eliquis, vancomycin, and midodrine during shift.  Foley temp still in place.  No fever.  Tx MASD and excoriation and cellulitis of the skin per medical orders.  No B/M today.  Vital signs WDL.

## 2020-05-14 NOTE — Progress Notes (Addendum)
Progress Note  Patient Name: Regina White Date of Encounter: 05/14/2020   Cardiologist: Freeman Caldron, MD - [facility]  Subjective   Patient feels a little better.  Currently voiding after urinary catheter placement.  Not sure if we will to void off catheter.  Inpatient Medications    Scheduled Meds: . acidophilus  1 capsule Oral Daily  . apixaban  2.5 mg Oral BID  . budesonide (PULMICORT) nebulizer solution  0.5 mg Nebulization BID  . Chlorhexidine Gluconate Cloth  6 each Topical Daily  . cholecalciferol  2,000 Units Oral Daily  . furosemide  40 mg Intravenous Daily  . Gerhardt's butt cream   Topical TID  . ipratropium-albuterol  3 mL Nebulization Q6H  . levothyroxine  200 mcg Oral Q0600  . midodrine  10 mg Oral TID WC  . montelukast  10 mg Oral QHS  . pantoprazole (PROTONIX) IV  40 mg Intravenous QHS  . silver sulfADIAZINE   Topical BID  . sodium chloride flush  10-40 mL Intracatheter Q12H  . traZODone  150 mg Oral QHS  . vancomycin  125 mg Oral TID   Continuous Infusions: . sodium chloride     PRN Meds: acetaminophen, docusate sodium, fentaNYL (SUBLIMAZE) injection, guaiFENesin-dextromethorphan, HYDROcodone-acetaminophen, HYDROmorphone (DILAUDID) injection, polyethylene glycol, sodium chloride flush   Vital Signs    Vitals:   05/14/20 0800 05/14/20 0900 05/14/20 1000 05/14/20 1400  BP: (!) 132/109 (!) 101/55 114/80 (!) 80/62  Pulse: 63 73 77 69  Resp: 19 20 (!) 21 16  Temp: 97.9 F (36.6 C) 98.2 F (36.8 C) 98.4 F (36.9 C) 98.8 F (37.1 C)  TempSrc:      SpO2: 94% 91% 95% 94%  Weight:      Height:        Intake/Output Summary (Last 24 hours) at 05/14/2020 1410 Last data filed at 05/14/2020 1327 Gross per 24 hour  Intake --  Output 3195 ml  Net -3195 ml   Last 3 Weights 05/13/2020 05/12/2020 05/12/2020  Weight (lbs) 239 lb 13.8 oz 238 lb 1.6 oz 245 lb  Weight (kg) 108.8 kg 108 kg 111.131 kg      Telemetry    Sinus rhythm, frequent PACs- Personally  Reviewed  ECG    No new tracing- Personally Reviewed  Physical Exam   GEN: No acute distress.   Neck: No JVD Cardiac: RRR, no murmurs, rubs, or gallops.  Respiratory:  Decreased breath sounds at bases GI: Soft, nontender, non-distended  MS: No edema; No deformity. Neuro:  Nonfocal  Psych: Normal affect   Labs    High Sensitivity Troponin:   Recent Labs  Lab 04/24/20 2157 04/25/20 0053 04/25/20 1916 05/12/20 0846  TROPONINIHS 42* 48* 19* 10      Chemistry Recent Labs  Lab 05/12/20 0136 05/12/20 0136 05/12/20 0605 05/13/20 0612 05/14/20 0428  NA 143  --   --  141 140  K 4.6  --   --  4.6 3.5  CL 111  --   --  110 104  CO2 23  --   --  24 27  GLUCOSE 101*  --   --  143* 134*  BUN 37*  --   --  31* 33*  CREATININE 1.23*   < > 1.23* 0.85 0.93  CALCIUM 7.8*  --   --  8.2* 8.2*  PROT 5.5*  --   --   --   --   ALBUMIN 2.6*  --   --   --  3.0*  AST 9*  --   --   --   --   ALT 9  --   --   --   --   ALKPHOS 47  --   --   --   --   BILITOT 0.5  --   --   --   --   GFRNONAA 46*   < > 46* >60 >60  GFRAA 53*   < > 53* >60 >60  ANIONGAP 9  --   --  7 9   < > = values in this interval not displayed.     Hematology Recent Labs  Lab 05/12/20 0846 05/13/20 0612 05/14/20 0428  WBC 8.2 5.0 5.7  RBC 4.43 3.86* 3.98  HGB 12.7 11.3* 11.4*  HCT 41.8 34.5* 35.7*  MCV 94.4 89.4 89.7  MCH 28.7 29.3 28.6  MCHC 30.4 32.8 31.9  RDW 19.9* 18.9* 18.5*  PLT 181 168 194    BNP Recent Labs  Lab 05/12/20 0846  BNP 564.1*     DDimer No results for input(s): DDIMER in the last 168 hours.   Radiology    US Venous Img Lower Unilateral Left (DVT)  Result Date: 05/13/2020 CLINICAL DATA:  Left lower extremity pain and edema. History of left-sided pelvic venous stenting extending to the level of the left common femoral vein. Evaluate for DVT. EXAM: LEFT LOWER EXTREMITY VENOUS DOPPLER ULTRASOUND TECHNIQUE: Gray-scale sonography with graded compression, as well as color Doppler  and duplex ultrasound were performed to evaluate the lower extremity deep venous systems from the level of the common femoral vein and including the common femoral, femoral, profunda femoral, popliteal and calf veins including the posterior tibial, peroneal and gastrocnemius veins when visible. The superficial great saphenous vein was also interrogated. Spectral Doppler was utilized to evaluate flow at rest and with distal augmentation maneuvers in the common femoral, femoral and popliteal veins. COMPARISON:  CT abdomen pelvis-04/24/2020 FINDINGS: Contralateral Common Femoral Vein: Respiratory phasicity is normal and symmetric with the symptomatic side. No evidence of thrombus. Normal compressibility. Common Femoral Vein: Left common femoral venous stent appears widely patent without evidence thrombus or in-stent stenosis. Saphenofemoral Junction: No evidence of thrombus. Normal compressibility and flow on color Doppler imaging. Profunda Femoral Vein: No evidence of thrombus. Normal compressibility and flow on color Doppler imaging. Femoral Vein: No evidence of thrombus. Normal compressibility, respiratory phasicity and response to augmentation. Popliteal Vein: No evidence of thrombus. Normal compressibility, respiratory phasicity and response to augmentation. Calf Veins: No evidence of thrombus. Normal compressibility and flow on color Doppler imaging. Superficial Great Saphenous Vein: No evidence of thrombus. Normal compressibility. Venous Reflux:  None. Other Findings:  None. IMPRESSION: 1. No evidence of acute or chronic DVT within the left lower extremity. 2. Venous stent within the left common femoral vein appears widely patent without evidence thrombus or in-stent stenosis. Electronically Signed   By: Simonne Come M.D.   On: 05/13/2020 10:35   DG Chest Port 1 View  Result Date: 05/14/2020 CLINICAL DATA:  Left hip pain EXAM: PORTABLE CHEST 1 VIEW COMPARISON:  05/12/2020 FINDINGS: Cardiomegaly and vascular  pedicle widening. The pulmonary arteries especially enlarged. Interstitial prominence without Kerley lines or effusion. No consolidation. IMPRESSION: Cardiomegaly and chronic pulmonary hypertension. No edema or focal pneumonia. Electronically Signed   By: Marnee Spring M.D.   On: 05/14/2020 10:50   DG HIP PORT UNILAT WITH PELVIS 1V LEFT  Result Date: 05/14/2020 CLINICAL DATA:  Left hip pain. EXAM: DG HIP (  WITH OR WITHOUT PELVIS) 1V PORT LEFT COMPARISON:  September 21, 2019. FINDINGS: No definite fracture is noted. Severe narrowing is seen involving the superior portion of the acetabulum consistent with severe degenerative joint disease. Possible sclerosis is noted in the left femoral head with deformity of the left femoral head suggesting possible avascular necrosis. Right hip is unremarkable. IMPRESSION: Severe degenerative joint disease of the left hip is noted. 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 Electronically Signed   By: Lupita Raider M.D.   On: 05/14/2020 10:46    Cardiac Studies   04/25/2020 1. Left ventricular ejection fraction, by estimation, is 35 to 40%. The  left ventricle has moderately decreased function. The left ventricle  demonstrates global hypokinesis. The left ventricular internal cavity size  was mildly dilated. Left ventricular  diastolic parameters are consistent with Grade I diastolic dysfunction  (impaired relaxation).  2. Right ventricular systolic function is moderately depressed. The right  ventricular size is moderately dilated. There is severely elevated  pulmonary artery systolic pressure.  3. Left atrial size was mildly dilated.  4. Tricuspid valve regurgitation is mild to moderate.  5. The inferior vena cava is dilated in size with >50% respiratory  variability, suggesting right atrial pressure of 8 mmHg.    Patient Profile     64 y.o. female chronic systolic heart failure, EF 35 to 40%, DVT on Eliquis, COPD, morbid  obesity, OSA, being seen due to hypotension and heart failure.  Assessment & Plan    1. HFrEF EF 35-40% -Shortness of breath improved with diuresing -Net -3.7 L over the past 24 hours -Creatinine normal -decrease lasix to 40mg  daily iv. Possibly switch to po daily tomorrow -No blood pressures preventing addition of guideline directed medical therapy (BB, entresto) -Resume CHF meds when blood pressure permits  2. Hypotension -currently on midodrine -BP stable -wean off midodrine if possible.  3.  Chronic respiratory failure, hypoxia -BiPAP, management as per ICU team  4.  Urinary retention -Status post Foley placement -Consider urology input  5.  History of left lower extremity DVT -On low-dose Eliquis dose as outpatient due to history of bleeding        Signed, , MD  05/14/2020, 2:10 PM

## 2020-05-14 NOTE — Consult Note (Signed)
ORTHOPAEDIC CONSULTATION  REQUESTING PHYSICIAN: Vida Rigger, MD  Chief Complaint: Left lower extremity pain  HPI: Regina White is a 64 y.o. female with a history of congestive heart failure who complains of left lower extremity pain for several months. Patient states she is not having significant pain when lying still in her ICU bed but her pain is exacerbated with movement including rolling in bed which causes severe pain. The pain involves her hip and radiates down her thigh. Patient states she has this pain prior to her admission to the hospital. 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 Patient also has a history of a lumbar spine instrumented fusion with chronic pain.  Past Medical History:  Diagnosis Date  . Asthma   . CHF (congestive heart failure) (HCC)   . Chronic back pain   . COPD (chronic obstructive pulmonary disease) (HCC)   . Pulmonary hypertension (HCC)    Past Surgical History:  Procedure Laterality Date  . BACK SURGERY     Social History   Socioeconomic History  . Marital status: Married    Spouse name: Not on file  . Number of children: Not on file  . Years of education: Not on file  . Highest education level: Not on file  Occupational History  . Not on file  Tobacco Use  . Smoking status: Former Smoker    Packs/day: 1.00    Years: 30.00    Pack years: 30.00    Types: Cigarettes    Quit date: 06/09/1999    Years since quitting: 20.9  . Smokeless tobacco: Never Used  Vaping Use  . Vaping Use: Never used  Substance and Sexual Activity  . Alcohol use: No  . Drug use: Never  . Sexual activity: Not Currently  Other Topics Concern  . Not on file  Social History Narrative   Living at [facility] at this time   Social Determinants of Health   Financial Resource Strain:   . Difficulty  of Paying Living Expenses:   Food Insecurity:   . Worried About Programme researcher, broadcasting/film/video in the Last Year:   . Barista in the Last Year:   Transportation Needs:   . Freight forwarder (Medical):   Marland Kitchen Lack of Transportation (Non-Medical):   Physical Activity:   . Days of Exercise per Week:   . Minutes of Exercise per Session:   Stress:   . Feeling of Stress :   Social Connections:   . Frequency of Communication with Friends and Family:   . Frequency of Social Gatherings with Friends and Family:   . Attends Religious Services:   . Active Member of Clubs or Organizations:   . Attends Banker Meetings:   Marland Kitchen Marital Status:    Family History  Problem Relation Age of Onset  . Hypertension Mother   . Liver cancer Father    Allergies  Allergen Reactions  . Penicillins Anaphylaxis    Breathing problems Did it involve swelling of the face/tongue/throat, SOB, or low BP? Yes Did it involve sudden or severe rash/hives, skin peeling, or any reaction on the inside of your mouth or nose? Unknown Did you need to seek medical attention at a hospital or doctor's office? Unknown When did it last happen?unknown If all above answers are "NO", may proceed with cephalosporin use. Patient  tolerated Ceftriaxone September 2020  . Erythromycin Nausea And Vomiting and Rash    STOMACH PAIN  . Levofloxacin Other (See Comments)    Muscle aches  . Lidocaine Rash  . Clindamycin Other (See Comments)    Recurrent C diff  . Levothyroxine Other (See Comments)    Vaginal bleeding Reaction to generic (pt currently takes name brand Synthroid)  . Tiotropium Other (See Comments)    Coughing up blood  . Doxycycline Nausea And Vomiting  . Sulfa Antibiotics Rash   Prior to Admission medications   Medication Sig Start Date End Date Taking? Authorizing Provider  acidophilus (RISAQUAD) CAPS capsule Take 1 capsule by mouth daily. 04/29/20  Yes Enedina FinnerPatel, Sona, MD  albuterol (VENTOLIN HFA) 108  (90 Base) MCG/ACT inhaler Inhale 2 puffs into the lungs every 6 (six) hours as needed for wheezing or shortness of breath. 10/25/19  Yes Tyrone NineGrunz, Ryan B, MD  apixaban (ELIQUIS) 2.5 MG TABS tablet Take 1 tablet (2.5 mg total) by mouth 2 (two) times daily. 10/25/19  Yes Tyrone NineGrunz, Ryan B, MD  bisoprolol (ZEBETA) 5 MG tablet Take 5 mg by mouth daily. 03/12/20  Yes [provider]  Cholecalciferol (VITAMIN D) 50 MCG (2000 UT) tablet Take 2,000 Units by mouth daily.   Yes [provider]  ENTRESTO 24-26 MG Take 1 tablet by mouth 2 (two) times daily. 04/13/20  Yes [provider]  famotidine (PEPCID) 20 MG tablet Take 1 tablet (20 mg total) by mouth 2 (two) times daily. Patient taking differently: Take 20 mg by mouth daily.  10/25/19  Yes Tyrone NineGrunz, Ryan B, MD  Fluticasone-Salmeterol (ADVAIR) 500-50 MCG/DOSE AEPB Inhale 1 puff into the lungs 2 (two) times daily. 10/25/19  Yes Tyrone NineGrunz, Ryan B, MD  guaiFENesin-dextromethorphan (ROBITUSSIN DM) 100-10 MG/5ML syrup Take 5 mLs by mouth every 4 (four) hours as needed for cough. 10/25/19  Yes Tyrone NineGrunz, Ryan B, MD  HYDROcodone-acetaminophen (NORCO/VICODIN) 5-325 MG tablet Take 1 tablet by mouth every 8 (eight) hours as needed for severe pain. 04/29/20  Yes Enedina FinnerPatel, Sona, MD  levothyroxine (SYNTHROID) 200 MCG tablet Take 1 tablet (200 mcg total) by mouth daily at 6 (six) AM. 10/25/19  Yes Tyrone NineGrunz, Ryan B, MD  montelukast (SINGULAIR) 10 MG tablet Take 1 tablet (10 mg total) by mouth at bedtime. 10/25/19  Yes Tyrone NineGrunz, Ryan B, MD  NON FORMULARY by Intrathecal Infusion route continuous. MORPHINE / BUPIVACAINE INTRATHECAL PUMP    Yes [provider]  polyethylene glycol (MIRALAX / GLYCOLAX) 17 g packet Take 17 g by mouth daily as needed (constipation).   Yes [provider]  traZODone (DESYREL) 50 MG tablet Take 150 mg by mouth at bedtime.  04/17/20  Yes [provider]   US Venous Img Lower Unilateral Left (DVT)  Result Date:  05/13/2020 CLINICAL DATA:  Left lower extremity pain and edema. History of left-sided pelvic venous stenting extending to the level of the left common femoral vein. Evaluate for DVT. EXAM: LEFT LOWER EXTREMITY VENOUS DOPPLER ULTRASOUND TECHNIQUE: Gray-scale sonography with graded compression, as well as color Doppler and duplex ultrasound were performed to evaluate the lower extremity deep venous systems from the level of the common femoral vein and including the common femoral, femoral, profunda femoral, popliteal and calf veins including the posterior tibial, peroneal and gastrocnemius veins when visible. The superficial great saphenous vein was also interrogated. Spectral Doppler was utilized to evaluate flow at rest and with distal augmentation maneuvers in the common femoral, femoral and popliteal veins. COMPARISON:  CT  abdomen pelvis-04/24/2020 FINDINGS: Contralateral Common Femoral Vein: Respiratory phasicity is normal and symmetric with the symptomatic side. No evidence of thrombus. Normal compressibility. Common Femoral Vein: Left common femoral venous stent appears widely patent without evidence thrombus or in-stent stenosis. Saphenofemoral Junction: No evidence of thrombus. Normal compressibility and flow on color Doppler imaging. Profunda Femoral Vein: No evidence of thrombus. Normal compressibility and flow on color Doppler imaging. Femoral Vein: No evidence of thrombus. Normal compressibility, respiratory phasicity and response to augmentation. Popliteal Vein: No evidence of thrombus. Normal compressibility, respiratory phasicity and response to augmentation. Calf Veins: No evidence of thrombus. Normal compressibility and flow on color Doppler imaging. Superficial Great Saphenous Vein: No evidence of thrombus. Normal compressibility. Venous Reflux:  None. Other Findings:  None. IMPRESSION: 1. No evidence of acute or chronic DVT within the left lower extremity. 2. Venous stent within the left common  femoral vein appears widely patent without evidence thrombus or in-stent stenosis. Electronically Signed   By: Simonne Come M.D.   On: 05/13/2020 10:35   DG Chest Port 1 View  Result Date: 05/14/2020 CLINICAL DATA:  Left hip pain EXAM: PORTABLE CHEST 1 VIEW COMPARISON:  05/12/2020 FINDINGS: Cardiomegaly and vascular pedicle widening. The pulmonary arteries especially enlarged. Interstitial prominence without Kerley lines or effusion. No consolidation. IMPRESSION: Cardiomegaly and chronic pulmonary hypertension. No edema or focal pneumonia. Electronically Signed   By: Marnee Spring M.D.   On: 05/14/2020 10:50   DG HIP PORT UNILAT WITH PELVIS 1V LEFT  Result Date: 05/14/2020 CLINICAL DATA:  Left hip pain. EXAM: DG HIP (WITH OR WITHOUT PELVIS) 1V PORT LEFT COMPARISON:  September 21, 2019. FINDINGS: No definite fracture is noted. Severe narrowing is seen involving the superior portion of the acetabulum consistent with severe degenerative joint disease. Possible sclerosis is noted in the left femoral head with deformity of the left femoral head suggesting possible avascular necrosis. Right hip is unremarkable. IMPRESSION: Severe degenerative joint disease of the left hip is noted. 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 Electronically Signed   By: Lupita Raider M.D.   On: 05/14/2020 10:46    Positive ROS: All other systems have been reviewed and were otherwise negative with the exception of those mentioned in the HPI and as above.  Physical Exam: General: Alert, no acute distress  MUSCULOSKELETAL: Left lower extremity: Patient skin is intact. There is no erythema ecchymosis swelling or deformity. She has no shortening or external rotation left lower extremity. Her thigh and leg compartments are soft and compressible. She has palpable pedal pulses, intact sensation light touch and intact motor function distally. 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 She has edema in the left lower leg with diffuse faint erythema but no calf tenderness.  Assessment: Left lower extremity pain secondary to left hip osteoarthritis/AVN versus peripheral vascular disease versus lumbar radiculopathy  Plan: I reviewed the patient's x-rays of the pelvis and left hip demonstrating advanced degenerative joint disease with possible fragmentation of the femoral head suggesting AVN. Patient significant joint space narrowing between left femoral head and acetabulum and marginal osteophytes with subchondral sclerosis. Patient has had a prior intra-articular injection of corticosteroid into her left hip which only lasted a few days. Patient states she has an appointment to see an arthroplasty specialist at [facility] orth[facility] on August 7. I recommend she keep this appointment as she may require hip replacement surgery. Patient will obviously need to be medically cleared for  surgery given her extensive medical comorbidities including congestive heart failure. At this point I would avoid an intra-articular injection of corticosteroid given that she has not responded well to this in the past and it may delay surgical replacement of the hip in the near future due to the theoretic possibility of increased risk for postop infection.  Patient states that her pain has been present since stenting of her iliac vein for post thrombotic syndrome. It is possible that a contributing factor to her pain could be circulatory, including peripheral vascular disease.  A left lower extremity venous Doppler ultrasound performed yesterday demonstrates no acute or chronic DVT in the left lower extremity. Her venous stent in the left common femoral vein appeared widely patent without evidence of thrombus or in-stent stenosis.  Finally, the patient has severe pain which is out of proportion to what is normally expected from hip osteoarthritis or AVN. She has a history of an instrumented fusion of the lumbar  spine and it is possible that her pain is neuropathic in origin. Patient states that her lumbar spine fusion surgery was performed at [facility] years ago. Patient may need further evaluation of her lumbar spine by neurosurgery as lumbar radiculopathy may be a contributing factor to her pain.  For the patient's pain control, she has a history of chronic pain and would benefit from a pain management consult.  I discussed this case earlier today with Dr. Karna Christmas.    Juanell Fairly, MD    05/14/2020 9:51 PM

## 2020-05-14 NOTE — Progress Notes (Signed)
Daily Progress Note   Patient Name: Regina White       Date: 05/14/2020 DOB: 04-Mar-1956  Age: 64 y.o. MRN#: [id number] Attending Physician: Vida Rigger, MD Primary Care Physician: Cain Sieve, MD Admit Date: 05/11/2020  Reason for Consultation/Follow-up: Establishing goals of care  Subjective: Patient is resting in bed with husband at bedside. She appears to be in good spirits today and is laughing and joking.  She is grateful that chaplain service will be returning to complete her advance directives.  Length of Stay: 2  Current Medications: Scheduled Meds:   acidophilus  1 capsule Oral Daily   apixaban  2.5 mg Oral BID   budesonide (PULMICORT) nebulizer solution  0.5 mg Nebulization BID   Chlorhexidine Gluconate Cloth  6 each Topical Daily   cholecalciferol  2,000 Units Oral Daily   furosemide  40 mg Intravenous Daily   Gerhardt's butt cream   Topical TID   ipratropium-albuterol  3 mL Nebulization Q6H   levothyroxine  200 mcg Oral Q0600   midodrine  10 mg Oral TID WC   montelukast  10 mg Oral QHS   pantoprazole (PROTONIX) IV  40 mg Intravenous QHS   silver sulfADIAZINE   Topical BID   sodium chloride flush  10-40 mL Intracatheter Q12H   traZODone  150 mg Oral QHS   vancomycin  125 mg Oral TID    Continuous Infusions:  sodium chloride      PRN Meds: acetaminophen, docusate sodium, fentaNYL (SUBLIMAZE) injection, guaiFENesin-dextromethorphan, HYDROcodone-acetaminophen, HYDROmorphone (DILAUDID) injection, polyethylene glycol, sodium chloride flush  Physical Exam Pulmonary:     Effort: Pulmonary effort is normal.  Neurological:     Mental Status: She is alert.             Vital Signs: BP (!) 80/62    Pulse 69    Temp 98.8 F (37.1 C)     Resp 16    Ht 5\' 5"  (1.651 m)    Wt 108.8 kg    LMP  (LMP Unknown)    SpO2 94%    BMI 39.91 kg/m  SpO2: SpO2: 94 % O2 Device: O2 Device: Nasal Cannula O2 Flow Rate: O2 Flow Rate (L/min): (S) 5 L/min  Intake/output summary:   Intake/Output Summary (Last 24 hours) at 05/14/2020 1403 Last data filed at 05/14/2020 1327  Gross per 24 hour  Intake --  Output 3195 ml  Net -3195 ml   LBM: Last BM Date: 05/12/20 Baseline Weight: Weight: 111.1 kg Most recent weight: Weight: 108.8 kg       Palliative Assessment/Data:      Patient Active Problem List   Diagnosis Date Noted   Acute on chronic respiratory failure with hypoxia (HCC) 05/12/2020   Hypotension    Acute on chronic systolic congestive heart failure (HCC)    Goals of care, counseling/discussion    Palliative care by specialist    Sepsis secondary to UTI (HCC) 04/24/2020   Sepsis (HCC) 04/24/2020   Pressure injury of skin 10/23/2019   COVID-19 virus infection 10/19/2019   Hyponatremia 10/19/2019   Hypovolemic shock (HCC) 07/22/2019   Acute respiratory failure with hypoxemia (HCC) 12/04/2018   Acute respiratory failure with hypoxia (HCC) 06/02/2018   Diarrhea of presumed infectious origin 02/17/2016   Right acute serous otitis media 02/17/2016   COPD (chronic obstructive pulmonary disease) (HCC) 12/23/2015   Heart failure with preserved ejection fraction (HCC) 12/23/2015   DDD (degenerative disc disease) 09/25/2013   Narrowing of intervertebral disc space 09/25/2013   OSA (obstructive sleep apnea) 07/25/2013   Deep vein thrombosis (DVT) (HCC) 06/25/2013   Erythema 06/18/2013   Left leg pain 06/18/2013   Mediastinal lymphadenopathy 06/18/2013   DVT, lower extremity (HCC) 06/14/2013   Pulmonary hypertension (HCC) 06/14/2013   Asthma 09/21/2010   Chronic pain 09/21/2010   Hypertension, benign 09/21/2010   Hypothyroidism 09/21/2010   Venous stasis 09/21/2010    Palliative Care Assessment  & Plan    Recommendations/Plan:  Recommend palliative to follow at discharge.  Code Status:    Code Status Orders  (From admission, onward)         Start     Ordered   05/12/20 0523  Full code  Continuous        05/12/20 0545        Code Status History    Date Active Date Inactive Code Status Order ID Comments User Context   04/24/2020 2026 04/29/2020 1939 Full Code [id number]  Rometta Emery, MD Inpatient   10/19/2019 2110 10/26/2019 0045 Full Code [id number]  Pearson Grippe, MD ED   07/22/2019 1221 08/02/2019 2010 Full Code [id number]  Adrian Saran, MD ED   12/04/2018 1806 12/04/2018 1806 Full Code [id number]  Enedina Finner, MD ED   06/02/2018 1722 06/03/2018 0011 Full Code [id number]  Alford Highland, MD Inpatient   Advance Care Planning Activity       Prognosis:   Unable to determine    Thank you for allowing the Palliative Medicine Team to assist in the care of this patient.   Total Time 15 min Prolonged Time Billed no      Greater than 50%  of this time was spent counseling and coordinating care related to the above assessment and plan.  Morton Stall, NP  Please contact Palliative Medicine Team phone at [phone] for questions and concerns.

## 2020-05-14 NOTE — Progress Notes (Signed)
CH visited pt. as follow-up from yesterday's visit and to finish getting MPOA paperwork notarized.  AD notarized in presence of witnesses.  Pt. makes husband MPOA and her son Regina White. alt. MPOA.  Pt does not want life prolonged artificially in any of the three situations described in Living Will.  Copy placed in chart and brought to MR for scanning. Pt. hopes she will be able to get hip replacement when she meets w/surgeon at [facility] this coming August; reportedly orthopedic MD will meet w/her at [facility] to see if there is anything that can be done to manage pt.'s hip pain until then.  Pt. says she is getting good care today in ICU and that her pain is well managed at this time.  No needs at this time, but pt. and husband appreciated CH visit.  CH may follow up later this week.    05/14/20 1500  Clinical Encounter Type  Visited With Patient and family together  Visit Type Follow-up;Psychological support;Social support;Critical Care (AD completion)  Spiritual Encounters  Spiritual Needs Emotional  Stress Factors  Patient Stress Factors Loss of control;Loss;Major life changes;Health changes

## 2020-05-14 NOTE — TOC Initial Note (Signed)
Transition of Care [facility]) - Initial/Assessment Note    Patient Details  Name: Regina White MRN: [id number] Date of Birth: [birth year]  Transition of [facility]) CM/SW Contact:    Trenton Founds, RN Phone Number: 05/14/2020, 11:43 AM  Clinical Narrative:   RNCM met with patient and husband at bedside. Patient reports to feeling some better today and is in good spirits. Patient is from home with husband and it is her plan to return there. Patient reports that she wants to continue on with the therapy services she already had in place with [facility] with [employer]. She is adamantly against any skilled rehab service. Patient reports that she typically has to travel by ambulance due to her disability. She sleeps in recliner in home but does have a wheel chair.  She uses [facility] for her medications and denies any issues affording them.    Expected Discharge Plan: Home w Home Health Services Barriers to Discharge: Continued Medical Work up   Patient Goals and CMS Choice        Expected Discharge Plan and Services Expected Discharge Plan: Home w Home Health Services     Post Acute Care Choice: Home Health Living arrangements for the past 2 months: Single Family Home                                      Prior Living Arrangements/Services Living arrangements for the past 2 months: Single Family Home Lives with:: Spouse Patient language and need for interpreter reviewed:: Yes        Need for Family Participation in Patient Care: Yes (Comment) Care giver support system in place?: Yes (comment)   Criminal Activity/Legal Involvement Pertinent to Current Situation/Hospitalization: No - Comment as needed  Activities of Daily Living Home Assistive Devices/Equipment: Eyeglasses ADL Screening (condition at time of admission) Patient's cognitive ability adequate to safely complete daily activities?: Yes Is the patient deaf or have difficulty hearing?: No Does  the patient have difficulty seeing, even when wearing glasses/contacts?: No Does the patient have difficulty concentrating, remembering, or making decisions?: No Patient able to express need for assistance with ADLs?: Yes Does the patient have difficulty dressing or bathing?: Yes Independently performs ADLs?: No Does the patient have difficulty walking or climbing stairs?: Yes Weakness of Legs: Both Weakness of Arms/Hands: None  Permission Sought/Granted                  Emotional Assessment Appearance:: Appears stated age Attitude/Demeanor/Rapport: Engaged Affect (typically observed): Appropriate Orientation: : Oriented to Self, Oriented to Place, Oriented to Situation, Oriented to  Time Alcohol / Substance Use: Not Applicable Psych Involvement: No (comment)  Admission diagnosis:  Acute respiratory failure (HCC) [J96.00] Hypoxemia [R09.02] SOB (shortness of breath) [R06.02] Scald of skin [T30.0, X19.XXXA] Acute on chronic respiratory failure with hypoxia (HCC) [J96.21] Hypotension, unspecified hypotension type [I95.9] Patient Active Problem List   Diagnosis Date Noted  . Acute on chronic respiratory failure with hypoxia (HCC) 05/12/2020  . Hypotension   . Acute on chronic systolic congestive heart failure (HCC)   . Goals of care, counseling/discussion   . Palliative care by specialist   . Sepsis secondary to UTI (HCC) 04/24/2020  . Sepsis (HCC) 04/24/2020  . Pressure injury of skin 10/23/2019  . COVID-19 virus infection 10/19/2019  . Hyponatremia 10/19/2019  . Hypovolemic shock (HCC) 07/22/2019  . Acute respiratory failure with hypoxemia (HCC)  12/04/2018  . Acute respiratory failure with hypoxia ([place]) 06/02/2018  . Diarrhea of presumed infectious origin 02/17/2016  . Right acute serous otitis media 02/17/2016  . COPD (chronic obstructive pulmonary disease) ([place]) 12/23/2015  . Heart failure with preserved ejection fraction ([place]) 12/23/2015  . DDD (degenerative disc  disease) 09/25/2013  . Narrowing of intervertebral disc space 09/25/2013  . OSA (obstructive sleep apnea) 07/25/2013  . Deep vein thrombosis (DVT) ([place]) 06/25/2013  . Erythema 06/18/2013  . Left leg pain 06/18/2013  . Mediastinal lymphadenopathy 06/18/2013  . DVT, lower extremity ([place]) 06/14/2013  . Pulmonary hypertension ([place]) 06/14/2013  . Asthma 09/21/2010  . Chronic pain 09/21/2010  . Hypertension, benign 09/21/2010  . Hypothyroidism 09/21/2010  . Venous stasis 09/21/2010   PCP:  Jacklynn Barnacle, MD Pharmacy:   [place], [place] - [address] [place] [address] Phone: [phone] Fax: [place], [place] [address] [place] [address] Phone: [phone] Fax: [phone]     Social Determinants of Health (SDOH) Interventions    Readmission Risk Interventions Readmission Risk Prevention Plan 10/21/2019  Transportation Screening Complete  Palliative Care Screening Not Applicable  Medication Review (RN Care Manager) Complete  Some recent data might be hidden

## 2020-05-15 DIAGNOSIS — I420 Dilated cardiomyopathy: Secondary | ICD-10-CM

## 2020-05-15 DIAGNOSIS — R339 Retention of urine, unspecified: Secondary | ICD-10-CM

## 2020-05-15 LAB — CBC
HCT: 33.7 % — ABNORMAL LOW (ref 36.0–46.0)
Hemoglobin: 11.1 g/dL — ABNORMAL LOW (ref 12.0–15.0)
MCH: 29 pg (ref 26.0–34.0)
MCHC: 32.9 g/dL (ref 30.0–36.0)
MCV: 88 fL (ref 80.0–100.0)
Platelets: 178 10*3/uL (ref 150–400)
RBC: 3.83 MIL/uL — ABNORMAL LOW (ref 3.87–5.11)
RDW: 18.6 % — ABNORMAL HIGH (ref 11.5–15.5)
WBC: 5.4 10*3/uL (ref 4.0–10.5)
nRBC: 0 % (ref 0.0–0.2)

## 2020-05-15 LAB — RENAL FUNCTION PANEL
Albumin: 3 g/dL — ABNORMAL LOW (ref 3.5–5.0)
Anion gap: 10 (ref 5–15)
BUN: 36 mg/dL — ABNORMAL HIGH (ref 8–23)
CO2: 27 mmol/L (ref 22–32)
Calcium: 8.2 mg/dL — ABNORMAL LOW (ref 8.9–10.3)
Chloride: 103 mmol/L (ref 98–111)
Creatinine, Ser: 1.09 mg/dL — ABNORMAL HIGH (ref 0.44–1.00)
GFR calc Af Amer: >60 mL/min (ref >60)
GFR calc non Af Amer: 53 mL/min — ABNORMAL LOW (ref >60)
Glucose, Bld: 84 mg/dL (ref 70–99)
Phosphorus: 3.8 mg/dL (ref 2.5–4.6)
Potassium: 3.8 mmol/L (ref 3.5–5.1)
Sodium: 140 mmol/L (ref 135–145)

## 2020-05-15 LAB — MAGNESIUM: Magnesium: 1.8 mg/dL (ref 1.7–2.4)

## 2020-05-15 MED ORDER — PANTOPRAZOLE SODIUM 40 MG PO TBEC
40.0000 mg | DELAYED_RELEASE_TABLET | Freq: Every day | ORAL | Status: DC
  Administered 2020-05-15 – 2020-05-19 (×5): 40 mg via ORAL
  Filled 2020-05-15 (×5): qty 1

## 2020-05-15 MED ORDER — POTASSIUM CHLORIDE CRYS ER 20 MEQ PO TBCR
20.0000 meq | EXTENDED_RELEASE_TABLET | Freq: Once | ORAL | Status: AC
  Administered 2020-05-15: 20 meq via ORAL
  Filled 2020-05-15: qty 1

## 2020-05-15 MED ORDER — MAGNESIUM SULFATE IN D5W 1-5 GM/100ML-% IV SOLN
1.0000 g | Freq: Once | INTRAVENOUS | Status: AC
  Administered 2020-05-15: 1 g via INTRAVENOUS
  Filled 2020-05-15: qty 100

## 2020-05-15 MED ORDER — HYDROCORTISONE NA SUCCINATE PF 100 MG IJ SOLR
50.0000 mg | Freq: Four times a day (QID) | INTRAMUSCULAR | Status: DC
  Administered 2020-05-15 – 2020-05-18 (×13): 50 mg via INTRAVENOUS
  Filled 2020-05-15 (×2): qty 2
  Filled 2020-05-15: qty 1
  Filled 2020-05-15: qty 2
  Filled 2020-05-15: qty 1
  Filled 2020-05-15: qty 2
  Filled 2020-05-15: qty 1
  Filled 2020-05-15: qty 2
  Filled 2020-05-15 (×2): qty 1
  Filled 2020-05-15: qty 2
  Filled 2020-05-15 (×2): qty 1

## 2020-05-15 NOTE — Progress Notes (Signed)
CRITICAL CARE PROGRESS NOTE    Name: THOMASINA HOUSLEY MRN: [id number] DOB: 04-03-1956     LOS: 3   SUBJECTIVE FINDINGS & SIGNIFICANT EVENTS    Patient description:  HPI: This is a 64 y/o female with a complex medical history who was recently hospitalized for Streptococcus agalactiae bacteremia from UTI and cellulitis of the posterior thigh, back and buttocks, acute on chronic hypoxic respiratory failure, acute on chronic heart failure and severe septic shock.  PCCM is being called to see patient for acute on chronic hypoxic respiratory failure and shock.  Per EMS records, EMS was called because patient had difficulty urinating.  When EMS arrived, patient was hypoxic with SPO2 of 68% on 4 L.  She was placed on 10 L and transferred to the ED.  Upon arrival in the ED, patient was hypotensive with a blood pressure of 83/61.  Her SPO2 had improved to 88% on 12 L nasal cannula.  She was placed on a nonrebreather with some improvement in her oxygenation.  A Foley catheter was inserted and currently draining clear urine.  She was also noted to have an extensive erythematous rash that extends from her mid to lower back and buttocks. Her ED work-up was unremarkable except for severe cardiomegaly with severe right ventricular dilatation and dilatation of the pulmonary trunk on CTA chest.  She remained severely hypoxic and hypotensive hands she is being admitted to the ICU for further management. 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 She is awake and denies any chest pain, palpitations, dyspnea, nausea, vomiting and headache.  She reports taking all her medications at home as prescribed.   SIGNIFICANT EVENTS:  04/29/2020: Discharged following  hospitalization for septic shock, acute hypoxic respiratory failure and severe sepsis secondary to cellulitis and UTI 05/12/2019: Readmitted with hypotension and acute on chronic hypoxic respiratory failure 05/14/20- patient continues to ask for more narcotics, she already has fentanyl, morphine and dilaudid and still asking for more opiates, im concerned for safety with respiratory drive. 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 05/15/20 - patient clinically improved, weaning on O2. PT/OT when able. No overnight events. LLE pain adequately treated with dilaudid and vicodin with fentanyl for break through pain.    Lines/tubes : Urethral Catheter Tory Emerald, RN Temperature probe 16 Fr. (Active)  Indication for Insertion or Continuance of Catheter Therapy based on hourly urine output monitoring and documentation for critical condition (NOT STRICT I&O) 05/13/20 1915  Site Assessment Clean;Intact 05/13/20 2000  Catheter Maintenance Bag below level of bladder;Catheter secured;Drainage bag/tubing not touching floor;No dependent loops 05/13/20 2000  Collection Container Standard drainage bag 05/13/20 2000  Securement Method Securing device (Describe) 05/13/20 2000  Urinary Catheter Interventions (if applicable) Unclamped 05/13/20 2000  Output (mL) 200 mL 05/14/20 0700    Microbiology/Sepsis markers: Results for orders placed or performed during the hospital encounter of 05/11/20  SARS Coronavirus 2 by RT PCR (hospital order, performed in [facility] hospital lab) Nasopharyngeal Nasopharyngeal Swab     Status: None   Collection Time: 05/12/20  5:45 AM   Specimen: Nasopharyngeal Swab  Result Value Ref Range Status   SARS Coronavirus 2 NEGATIVE NEGATIVE Final    Comment: (NOTE) SARS-CoV-2 target nucleic acids are NOT DETECTED.  The SARS-CoV-2 RNA is generally detectable in upper and lower respiratory specimens during the acute  phase  of infection. The lowest concentration of SARS-CoV-2 viral copies this assay can detect is 250 copies / mL. A negative result does not preclude SARS-CoV-2 infection and should not be used as the sole basis for treatment or other patient management decisions.  A negative result may occur with improper specimen collection / handling, submission of specimen other than nasopharyngeal swab, presence of viral mutation(s) within the areas targeted by this assay, and inadequate number of viral copies (<250 copies / mL). A negative result must be combined with clinical observations, patient history, and epidemiological information.  Fact Sheet for Patients:   BoilerBrush.com.cy  Fact Sheet for Healthcare Providers: https://pope.com/  This test is not yet approved or  cleared by the [place] FDA and has been authorized for detection and/or diagnosis of SARS-CoV-2 by FDA under an Emergency Use Authorization (EUA).  This EUA will remain in effect (meaning this test can be used) for the duration of the COVID-19 declaration under Section 564(b)(1) of the Act, 21 U.S.C. section 360bbb-3(b)(1), unless the authorization is terminated or revoked sooner.  Performed at [facility], [address] [place], [place] [address]     Anti-infectives:  Anti-infectives (From admission, onward)   Start     Dose/Rate Route Frequency Ordered Stop   05/12/20 1600  vancomycin (VANCOCIN) 125 MG capsule 125 mg  Status:  Discontinued        125 mg Oral 3 times daily 05/12/20 1408 05/15/20 0731   05/12/20 1400  vancomycin (VANCOCIN) 50 mg/mL oral solution 125 mg  Status:  Discontinued        125 mg Oral Every 8 hours 05/12/20 1236 05/12/20 1408   05/12/20 1000  vancomycin (VANCOCIN) 125 MG capsule 125 mg  Status:  Discontinued        125 mg Oral 3 times daily 05/12/20 0549 05/12/20 1236       Consults: Treatment Team:  Rosalene Billings, MD     Tests / Events: TTE    PAST MEDICAL HISTORY   Past Medical History:  Diagnosis Date  . Asthma   . CHF (congestive heart failure) (HCC)   . Chronic back pain   . COPD (chronic obstructive pulmonary disease) (HCC)   . Pulmonary hypertension (HCC)      SURGICAL HISTORY   Past Surgical History:  Procedure Laterality Date  . BACK SURGERY       FAMILY HISTORY   Family History  Problem Relation Age of Onset  . Hypertension Mother   . Liver cancer Father      SOCIAL HISTORY   Social History   Tobacco Use  . Smoking status: Former Smoker    Packs/day: 1.00    Years: 30.00    Pack years: 30.00    Types: Cigarettes    Quit date: 06/09/1999    Years since quitting: 20.9  . Smokeless tobacco: Never Used  Vaping Use  . Vaping Use: Never used  Substance Use Topics  . Alcohol use: No  . Drug use: Never     MEDICATIONS   Current Medication:  Current Facility-Administered Medications:  .  0.9 %  sodium chloride infusion, 250 mL, Intravenous, Continuous, Tukov-Yual, Magdalene S, NP .  acetaminophen (TYLENOL) tablet 650 mg, 650 mg, Oral, Q4H PRN, Tukov-Yual, Magdalene S, NP, 650 mg at 05/12/20 1042 .  acidophilus (RISAQUAD) capsule 1 capsule, 1 capsule, Oral, Daily, Tukov-Yual, Magdalene S, NP, 1 capsule at 05/15/20 0922 .  apixaban (ELIQUIS) tablet 2.5 mg, 2.5 mg, Oral,  BID, Tukov-Yual, Magdalene S, NP, 2.5 mg at 05/15/20 0920 .  budesonide (PULMICORT) nebulizer solution 0.5 mg, 0.5 mg, Nebulization, BID, Tukov-Yual, Magdalene S, NP, 0.5 mg at 05/15/20 0745 .  Chlorhexidine Gluconate Cloth 2 % PADS 6 each, 6 each, Topical, Daily, Vida Rigger, MD, 6 each at 05/15/20 0319 .  cholecalciferol (VITAMIN D) tablet 2,000 Units, 2,000 Units, Oral, Daily, Tukov-Yual, Magdalene S, NP, 2,000 Units at 05/15/20 0920 .  docusate sodium (COLACE) capsule 100 mg, 100 mg, Oral, BID PRN, Tukov-Yual, Magdalene S, NP .  fentaNYL (SUBLIMAZE)  injection 12.5 mcg, 12.5 mcg, Intravenous, Q4H PRN, Eugenie Norrie, NP, 12.5 mcg at 05/13/20 0844 .  furosemide (LASIX) injection 40 mg, 40 mg, Intravenous, Daily, Vida Rigger, MD, 40 mg at 05/15/20 0923 .  Gerhardt's butt cream, , Topical, TID, Vida Rigger, MD, Given at 05/15/20 [phone] .  guaiFENesin-dextromethorphan (ROBITUSSIN DM) 100-10 MG/5ML syrup 5 mL, 5 mL, Oral, Q4H PRN, Tukov-Yual, Magdalene S, NP .  HYDROcodone-acetaminophen (NORCO/VICODIN) 5-325 MG per tablet 1-2 tablet, 1-2 tablet, Oral, Q6H PRN, Eugenie Norrie, NP, 2 tablet at 05/14/20 2123 .  HYDROmorphone (DILAUDID) injection 1 mg, 1 mg, Intravenous, Q4H PRN, Vida Rigger, MD, 1 mg at 05/15/20 0747 .  ipratropium-albuterol (DUONEB) 0.5-2.5 (3) MG/3ML nebulizer solution 3 mL, 3 mL, Nebulization, Q6H, Tukov-Yual, Magdalene S, NP, 3 mL at 05/15/20 0745 .  levothyroxine (SYNTHROID) tablet 200 mcg, 200 mcg, Oral, Q0600, Tukov-Yual, Magdalene S, NP, 200 mcg at 05/15/20 0506 .  midodrine (PROAMATINE) tablet 10 mg, 10 mg, Oral, TID WC, Evany Schecter, MD, 10 mg at 05/15/20 0920 .  montelukast (SINGULAIR) tablet 10 mg, 10 mg, Oral, QHS, Tukov-Yual, Magdalene S, NP, 10 mg at 05/14/20 2113 .  pantoprazole (PROTONIX) EC tablet 40 mg, 40 mg, Oral, Daily, Lowella Bandy, RPH, 40 mg at 05/15/20 4742 .  polyethylene glycol (MIRALAX / GLYCOLAX) packet 17 g, 17 g, Oral, Daily PRN, Tukov-Yual, Magdalene S, NP .  silver sulfADIAZINE (SILVADENE) 1 % cream, , Topical, BID, Tukov-Yual, Magdalene S, NP, Given at 05/15/20 0923 .  sodium chloride flush (NS) 0.9 % injection 10-40 mL, 10-40 mL, Intracatheter, Q12H, Loleta Rose, MD, 20 mL at 05/15/20 0923 .  sodium chloride flush (NS) 0.9 % injection 10-40 mL, 10-40 mL, Intracatheter, PRN, Loleta Rose, MD .  traZODone (DESYREL) tablet 150 mg, 150 mg, Oral, QHS, Tukov-Yual, Magdalene S, NP, 150 mg at 05/14/20 2112    ALLERGIES   Penicillins, Erythromycin, Levofloxacin, Lidocaine,  Clindamycin, Levothyroxine, Tiotropium, Doxycycline, and Sulfa antibiotics    REVIEW OF SYSTEMS     10 point ROS done and is negative except for generalized pain and pain at left hip where patient states she has arthritis.  PHYSICAL EXAMINATION   Vital Signs: Temp:  [97.5 F (36.4 C)-99 F (37.2 C)] 97.9 F (36.6 C) (07/08 0900) Pulse Rate:  [56-152] 85 (07/08 0900) Resp:  [13-24] 20 (07/08 0900) BP: (80-140)/(55-90) 122/69 (07/08 0900) SpO2:  [88 %-96 %] 91 % (07/08 0900) FiO2 (%):  [40 %] 40 % (07/07 1415) Weight:  [108.6 kg] 108.6 kg (07/08 0315)  GENERAL:chronically ill appearing, crying during interview intermittently HEAD: Normocephalic, atraumatic.  EYES: Pupils equal, round, reactive to light.  No scleral icterus.  MOUTH: Moist mucosal membrane. NECK: Supple. No thyromegaly. No nodules. No JVD.  PULMONARY: crackles at bases bilaterally CARDIOVASCULAR: S1 and S2. Regular rate and rhythm. No murmurs, rubs, or gallops.  GASTROINTESTINAL: Soft, nontender, non-distended. No masses. Positive bowel sounds. No hepatosplenomegaly.  MUSCULOSKELETAL: No  swelling, clubbing, or edema. Left him and LE pain and tenderness. Rash on back image below NEUROLOGIC: Mild distress due to acute illness.   SKIN:intact,warm,dry   PERTINENT DATA     Infusions: . sodium chloride     Scheduled Medications: . acidophilus  1 capsule Oral Daily  . apixaban  2.5 mg Oral BID  . budesonide (PULMICORT) nebulizer solution  0.5 mg Nebulization BID  . Chlorhexidine Gluconate Cloth  6 each Topical Daily  . cholecalciferol  2,000 Units Oral Daily  . furosemide  40 mg Intravenous Daily  . Gerhardt's butt cream   Topical TID  . ipratropium-albuterol  3 mL Nebulization Q6H  . levothyroxine  200 mcg Oral Q0600  . midodrine  10 mg Oral TID WC  . montelukast  10 mg Oral QHS  . pantoprazole  40 mg Oral Daily  . silver sulfADIAZINE   Topical BID  . sodium chloride flush  10-40 mL Intracatheter Q12H   . traZODone  150 mg Oral QHS   PRN Medications: acetaminophen, docusate sodium, fentaNYL (SUBLIMAZE) injection, guaiFENesin-dextromethorphan, HYDROcodone-acetaminophen, HYDROmorphone (DILAUDID) injection, polyethylene glycol, sodium chloride flush Hemodynamic parameters:   Intake/Output: 07/07 0701 - 07/08 0700 In: 700 [P.O.:700] Out: 2600 [Urine:2600]  Ventilator  Settings: FiO2 (%):  [40 %] 40 %  LAB RESULTS:  Basic Metabolic Panel: Recent Labs  Lab 05/12/20 0136 05/12/20 0136 05/12/20 0605 05/13/20 0612 05/13/20 0612 05/14/20 0428 05/15/20 0420  NA 143  --   --  141  --  140 140  K 4.6   < >  --  4.6   < > 3.5 3.8  CL 111  --   --  110  --  104 103  CO2 23  --   --  24  --  27 27  GLUCOSE 101*  --   --  143*  --  134* 84  BUN 37*  --   --  31*  --  33* 36*  CREATININE 1.23*  --  1.23* 0.85  --  0.93 1.09*  CALCIUM 7.8*  --   --  8.2*  --  8.2* 8.2*  MG  --   --   --  1.8  --  1.9 1.8  PHOS  --   --   --  3.4  --  3.5 3.8   < > = values in this interval not displayed.   Liver Function Tests: Recent Labs  Lab 05/12/20 0136 05/14/20 0428 05/15/20 0420  AST 9*  --   --   ALT 9  --   --   ALKPHOS 47  --   --   BILITOT 0.5  --   --   PROT 5.5*  --   --   ALBUMIN 2.6* 3.0* 3.0*   No results for input(s): LIPASE, AMYLASE in the last 168 hours. No results for input(s): AMMONIA in the last 168 hours. CBC: Recent Labs  Lab 05/12/20 0056 05/12/20 0846 05/13/20 0612 05/14/20 0428 05/15/20 0420  WBC 6.7 8.2 5.0 5.7 5.4  NEUTROABS 4.8  --   --   --   --   HGB 12.2 12.7 11.3* 11.4* 11.1*  HCT 40.8 41.8 34.5* 35.7* 33.7*  MCV 96.0 94.4 89.4 89.7 88.0  PLT 171 181 168 194 178   Cardiac Enzymes: No results for input(s): CKTOTAL, CKMB, CKMBINDEX, TROPONINI in the last 168 hours. BNP: Invalid input(s): POCBNP CBG: Recent Labs  Lab 05/12/20 0849  GLUCAP 100*  IMAGING RESULTS:  Imaging: US Venous Img Lower Unilateral Left (DVT)  Result Date:  05/13/2020 CLINICAL DATA:  Left lower extremity pain and edema. History of left-sided pelvic venous stenting extending to the level of the left common femoral vein. Evaluate for DVT. EXAM: LEFT LOWER EXTREMITY VENOUS DOPPLER ULTRASOUND TECHNIQUE: Gray-scale sonography with graded compression, as well as color Doppler and duplex ultrasound were performed to evaluate the lower extremity deep venous systems from the level of the common femoral vein and including the common femoral, femoral, profunda femoral, popliteal and calf veins including the posterior tibial, peroneal and gastrocnemius veins when visible. The superficial great saphenous vein was also interrogated. Spectral Doppler was utilized to evaluate flow at rest and with distal augmentation maneuvers in the common femoral, femoral and popliteal veins. COMPARISON:  CT abdomen pelvis-04/24/2020 FINDINGS: Contralateral Common Femoral Vein: Respiratory phasicity is normal and symmetric with the symptomatic side. No evidence of thrombus. Normal compressibility. Common Femoral Vein: Left common femoral venous stent appears widely patent without evidence thrombus or in-stent stenosis. Saphenofemoral Junction: No evidence of thrombus. Normal compressibility and flow on color Doppler imaging. Profunda Femoral Vein: No evidence of thrombus. Normal compressibility and flow on color Doppler imaging. Femoral Vein: No evidence of thrombus. Normal compressibility, respiratory phasicity and response to augmentation. Popliteal Vein: No evidence of thrombus. Normal compressibility, respiratory phasicity and response to augmentation. Calf Veins: No evidence of thrombus. Normal compressibility and flow on color Doppler imaging. Superficial Great Saphenous Vein: No evidence of thrombus. Normal compressibility. Venous Reflux:  None. Other Findings:  None. IMPRESSION: 1. No evidence of acute or chronic DVT within the left lower extremity. 2. Venous stent within the left common  femoral vein appears widely patent without evidence thrombus or in-stent stenosis. Electronically Signed   By: Simonne Come M.D.   On: 05/13/2020 10:35   DG Chest Port 1 View  Result Date: 05/14/2020 CLINICAL DATA:  Left hip pain EXAM: PORTABLE CHEST 1 VIEW COMPARISON:  05/12/2020 FINDINGS: Cardiomegaly and vascular pedicle widening. The pulmonary arteries especially enlarged. Interstitial prominence without Kerley lines or effusion. No consolidation. IMPRESSION: Cardiomegaly and chronic pulmonary hypertension. No edema or focal pneumonia. Electronically Signed   By: Marnee Spring M.D.   On: 05/14/2020 10:50   DG HIP PORT UNILAT WITH PELVIS 1V LEFT  Result Date: 05/14/2020 CLINICAL DATA:  Left hip pain. EXAM: DG HIP (WITH OR WITHOUT PELVIS) 1V PORT LEFT COMPARISON:  September 21, 2019. FINDINGS: No definite fracture is noted. Severe narrowing is seen involving the superior portion of the acetabulum consistent with severe degenerative joint disease. Possible sclerosis is noted in the left femoral head with deformity of the left femoral head suggesting possible avascular necrosis. Right hip is unremarkable. IMPRESSION: Severe degenerative joint disease of the left hip is noted. 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 Electronically Signed   By: Lupita Raider M.D.   On: 05/14/2020 10:46   @ DG Chest Port 1 View  Result Date: 05/14/2020 CLINICAL DATA:  Left hip pain EXAM: PORTABLE CHEST 1 VIEW COMPARISON:  05/12/2020 FINDINGS: Cardiomegaly and vascular pedicle widening. The pulmonary arteries especially enlarged. Interstitial prominence without Kerley lines or effusion. No consolidation. IMPRESSION: Cardiomegaly and chronic pulmonary hypertension. No edema or focal pneumonia. Electronically Signed   By: Marnee Spring M.D.   On: 05/14/2020 10:50   DG HIP PORT UNILAT WITH PELVIS 1V LEFT  Result Date: 05/14/2020 CLINICAL DATA:  Left hip pain. EXAM: DG HIP (WITH OR WITHOUT  PELVIS) 1V PORT LEFT COMPARISON:  September 21, 2019. FINDINGS: No definite fracture is noted. Severe narrowing is seen involving the superior portion of the acetabulum consistent with severe degenerative joint disease. Possible sclerosis is noted in the left femoral head with deformity of the left femoral head suggesting possible avascular necrosis. Right hip is unremarkable. IMPRESSION: Severe degenerative joint disease of the left hip is noted. 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 Electronically Signed   By: Lupita Raider M.D.   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  - patient will follow up on outpatient with [facility]  - ortho consult for any additional recommendations - patient in severe pain on high dose narcotics  Skin rash with excoriation   - patient had episodes of laying down in urine at home    - skin care /wound care   ID -continue IV abx as prescibed -follow up cultures  GI/Nutrition GI PROPHYLAXIS as indicated DIET-->TF's as tolerated Constipation protocol as indicated  ENDO - ICU hypoglycemic\Hyperglycemia protocol -check FSBS per protocol   ELECTROLYTES -follow labs as needed -replace as needed -pharmacy consultation   DVT/GI PRX ordered -SCDs  TRANSFUSIONS AS NEEDED MONITOR FSBS ASSESS the need for LABS as needed   Critical care provider statement:    Critical care time (minutes):  33   Critical care time was exclusive of:   Separately billable procedures and treating other patients   Critical care was necessary to treat or prevent imminent or life-threatening deterioration of the following conditions:  acute on chronic hypoxemic respiratory failure, CHF, obesity, left hip arthritis   Critical care was time spent personally by me on the following activities:  Development of treatment plan with patient or surrogate, discussions with consultants, evaluation of patient's response to treatment, examination of patient, obtaining history from patient or surrogate, ordering and performing treatments and interventions, ordering and review of laboratory studies and re-evaluation of patient's condition.  I assumed direction of critical care for this patient from another provider in my specialty: no    This document was prepared using Dragon voice recognition software and may include unintentional dictation errors.    Vida Rigger, M.D.  Division of Pulmonary & Critical Care Medicine  [facility]

## 2020-05-15 NOTE — Progress Notes (Signed)
Patient remains AOx4.  Husband spent morning with patient at bedside.  She continues to cry out in pain when we move her and do Q2 turns in bed.  Her pain is excruciating and radiating from her left hip to her left foot. Her foley is still in place and her urine output is WDL and yellow.  Administered lasix earlier in shift, urine output increased.  She cannot move without pain.  Her vitals are WDL in NSR with normal B/P and saturating on 5L [place] in the low 90's.  Patient received dilaudid and Vicodin for pain.  Large B/M today.

## 2020-05-15 NOTE — Consult Note (Signed)
PHARMACY CONSULT NOTE  Pharmacy Consult for Electrolyte Monitoring and Replacement   Recent Labs: Potassium (mmol/L)  Date Value  05/15/2020 3.8   Magnesium (mg/dL)  Date Value  01/60/1093 1.8   Calcium (mg/dL)  Date Value  23/55/7322 8.2 (L)   Albumin (g/dL)  Date Value  02/54/2706 3.0 (L)   Phosphorus (mg/dL)  Date Value  23/76/2831 3.8   Sodium (mmol/L)  Date Value  05/15/2020 140   Corrected Ca: 9.32 mg/dL  Assessment: 64 y/o female with a complex medical history presenting with acute on chronic hypoxic respiratory failure/acute COPD exacerbation, cardiogenic shock, severe erythematous rash and 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  She is currently on IV Lasix 40 mg daily  Goal of Therapy:  Potassium 4.0 - 5.1 mmol/L Magnesium 2.0 - 2.4 mg/dL All Other Electrolytes WNL  Plan:   Oral KCl 20 mEq x1   1 gram IV magnesium sulfate x 1  Re-check electrolytes in am 7/9  Lowella Bandy ,PharmD Clinical Pharmacist 05/15/2020 6:58 AM

## 2020-05-15 NOTE — Progress Notes (Signed)
Progress Note  Patient Name: Regina White Date of Encounter: 05/15/2020  [facility] Cardiologist: Iona Coach, MD   Subjective   Reports feeling well, no complaints apart from her hip pain Feels her breathing is close to baseline Saturations on oxygen 88% up to 90%, m family at the bedside concerned this could be just to inaccurate O2 sat monitoring on her finger Blood pressure is 110 systolic, appears she is receiving midodrine 10mg  3 times daily Has Foley catheter in place, good urine output  Inpatient Medications    Scheduled Meds: . acidophilus  1 capsule Oral Daily  . apixaban  2.5 mg Oral BID  . budesonide (PULMICORT) nebulizer solution  0.5 mg Nebulization BID  . Chlorhexidine Gluconate Cloth  6 each Topical Daily  . cholecalciferol  2,000 Units Oral Daily  . furosemide  40 mg Intravenous Daily  . Gerhardt's butt cream   Topical TID  . hydrocortisone sod succinate (SOLU-CORTEF) inj  50 mg Intravenous Q6H  . ipratropium-albuterol  3 mL Nebulization Q6H  . levothyroxine  200 mcg Oral Q0600  . midodrine  10 mg Oral TID WC  . montelukast  10 mg Oral QHS  . pantoprazole  40 mg Oral Daily  . silver sulfADIAZINE   Topical BID  . sodium chloride flush  10-40 mL Intracatheter Q12H  . traZODone  150 mg Oral QHS   Continuous Infusions: . sodium chloride     PRN Meds: acetaminophen, docusate sodium, fentaNYL (SUBLIMAZE) injection, guaiFENesin-dextromethorphan, HYDROcodone-acetaminophen, HYDROmorphone (DILAUDID) injection, polyethylene glycol, sodium chloride flush   Vital Signs    Vitals:   05/15/20 1200 05/15/20 1300 05/15/20 1315 05/15/20 1400  BP:  107/66  (!) 110/57  Pulse: 81 83  82  Resp: 17 19  (!) 21  Temp: 98.8 F (37.1 C) 98.8 F (37.1 C)  99.1 F (37.3 C)  TempSrc: Bladder Bladder    SpO2: 93% (!) 89% 90% (!) 88%  Weight:      Height:        Intake/Output Summary (Last 24 hours) at 05/15/2020 1507 Last data filed at 05/15/2020  1201 Gross per 24 hour  Intake 700 ml  Output 3150 ml  Net -2450 ml   Last 3 Weights 05/15/2020 05/13/2020 05/12/2020  Weight (lbs) 239 lb 6.7 oz 239 lb 13.8 oz 238 lb 1.6 oz  Weight (kg) 108.6 kg 108.8 kg 108 kg      Telemetry    Normal sinus rhythm- Personally Reviewed  ECG     - Personally Reviewed  Physical Exam   GEN: No acute distress.   Neck:  Unable to estimate JVD Cardiac: RRR, no murmurs, rubs, or gallops.  Respiratory: Clear to auscultation bilaterally. GI: Soft, nontender, non-distended  MS: No edema; No deformity. Neuro:  Nonfocal , full exam not performed Psych: Normal affect   Labs    High Sensitivity Troponin:   Recent Labs  Lab 04/24/20 2157 04/25/20 0053 04/25/20 1916 05/12/20 0846  TROPONINIHS 42* 48* 19* 10      Chemistry Recent Labs  Lab 05/12/20 0136 05/12/20 0605 05/13/20 0612 05/14/20 0428 05/15/20 0420  NA 143  --  141 140 140  K 4.6  --  4.6 3.5 3.8  CL 111  --  110 104 103  CO2 23  --  24 27 27   GLUCOSE 101*  --  143* 134* 84  BUN 37*  --  31* 33* 36*  CREATININE 1.23*   < > 0.85 0.93 1.09*  CALCIUM 7.8*  --  8.2* 8.2* 8.2*  PROT 5.5*  --   --   --   --   ALBUMIN 2.6*  --   --  3.0* 3.0*  AST 9*  --   --   --   --   ALT 9  --   --   --   --   ALKPHOS 47  --   --   --   --   BILITOT 0.5  --   --   --   --   GFRNONAA 46*   < > >60 >60 53*  GFRAA 53*   < > >60 >60 >60  ANIONGAP 9  --  7 9 10    < > = values in this interval not displayed.     Hematology Recent Labs  Lab 05/13/20 0612 05/14/20 0428 05/15/20 0420  WBC 5.0 5.7 5.4  RBC 3.86* 3.98 3.83*  HGB 11.3* 11.4* 11.1*  HCT 34.5* 35.7* 33.7*  MCV 89.4 89.7 88.0  MCH 29.3 28.6 29.0  MCHC 32.8 31.9 32.9  RDW 18.9* 18.5* 18.6*  PLT 168 194 178    BNP Recent Labs  Lab 05/12/20 0846  BNP 564.1*     DDimer No results for input(s): DDIMER in the last 168 hours.   Radiology    DG Chest Port 1 View  Result Date: 05/14/2020 CLINICAL DATA:  Left hip pain EXAM:  PORTABLE CHEST 1 VIEW COMPARISON:  05/12/2020 FINDINGS: Cardiomegaly and vascular pedicle widening. The pulmonary arteries especially enlarged. Interstitial prominence without Kerley lines or effusion. No consolidation. IMPRESSION: Cardiomegaly and chronic pulmonary hypertension. No edema or focal pneumonia. Electronically Signed   By: 07/13/2020 M.D.   On: 05/14/2020 10:50   DG HIP PORT UNILAT WITH PELVIS 1V LEFT  Result Date: 05/14/2020 CLINICAL DATA:  Left hip pain. EXAM: DG HIP (WITH OR WITHOUT PELVIS) 1V PORT LEFT COMPARISON:  September 21, 2019. FINDINGS: No definite fracture is noted. Severe narrowing is seen involving the superior portion of the acetabulum consistent with severe degenerative joint disease. Possible sclerosis is noted in the left femoral head with deformity of the left femoral head suggesting possible avascular necrosis. Right hip is unremarkable. IMPRESSION: Severe degenerative joint disease of the left hip is noted. 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 Electronically Signed   By: September 23, 2019 M.D.   On: 05/14/2020 10:46    Cardiac Studies   Echocardiogram April 25, 2020  1. Left ventricular ejection fraction, by estimation, is 35 to 40%. The  left ventricle has moderately decreased function. The left ventricle  demonstrates global hypokinesis. The left ventricular internal cavity size  was mildly dilated. Left ventricular  diastolic parameters are consistent with Grade I diastolic dysfunction  (impaired relaxation).  2. Right ventricular systolic function is moderately depressed. The right  ventricular size is moderately dilated. There is severely elevated  pulmonary artery systolic pressure.  3. Left atrial size was mildly dilated.  4. Tricuspid valve regurgitation is mild to moderate.  5. The inferior vena cava is dilated in size with >50% respiratory  variability, suggesting right atrial pressure of 8 mmHg.   Patient Profile      64 y.o. female chronic systolic heart failure, EF 35 to 40%, DVT on Eliquis, COPD, morbid obesity, OSA, being seen due to hypotension and heart failure.  Assessment & Plan    1.  Urine retention Recent hospitalization for urosepsis Management per urology, has indwelling Foley, good  urine output  2. HFrEF EF 35-40% Exacerbated by urine outflow tract obstruction requiring Foley catheter Stable but slow trend up in the creatinine now 1.09 BUN 36 -8 L this admission -Heart failure medications limited by hypotension, initially on pressors was weaned to midodrine 10mg   3 times daily -Blood pressure continues to run on the low side, unable to wean off midodrine at this time (potentially might be able to try 5 mg 3 times daily) -Could transition to oral Lasix for any further climbing BUN/creatinine will monitor renal function tomorrow morning -Saturations still mildly low 89%, but she reports this is at her baseline  3.  Hypotension On midodrine 10 mg 3 times daily Unable to wean at this time Drop in pressure likely exacerbated by chronic narcotics, around-the-clock pain medication Unable to exclude lingering infection, cultures being followed High risk of recurrent infection for UTI and sepsis given sacral decub wounds  4.  Chronic respiratory failure, hypoxia Still smoking, pulmonary hypertension, Chronic hypoxia -Slow improvement with gentle diuresis -8 L, oxygen support.  Likely getting closer to her baseline  5.  Urinary retention -Status post Foley placement Urology follow-up Very difficult situation given she is essentially bedbound  5.  History of left lower extremity DVT -On low-dose Eliquis dose as outpatient due to history of bleeding  Case discussed with family, intensivist Difficult situation given inability to wean midodrine, chronic narcotics, chronic debility  Total encounter time more than 35 minutes  Greater than 50% was spent in counseling and coordination of  care with the patient    For questions or updates, please contact [facility] Please consult [email or web address] for contact info under        Signed, , MD  05/15/2020, 3:07 PM

## 2020-05-15 NOTE — Progress Notes (Signed)
Patient experienced non-sustained vtach during the night shift per report.  Patient had multiple events of PVC and PAC.  Echo demonstrated left ventricular hypokinesis per Echo report.  35-40% EF.  Will speak  with physician this morning about plan of care while tx infection.  Patient vancomycin therapy completed.

## 2020-05-16 DIAGNOSIS — M7989 Other specified soft tissue disorders: Secondary | ICD-10-CM

## 2020-05-16 DIAGNOSIS — M79662 Pain in left lower leg: Secondary | ICD-10-CM

## 2020-05-16 LAB — BASIC METABOLIC PANEL
Anion gap: 11 (ref 5–15)
BUN: 33 mg/dL — ABNORMAL HIGH (ref 8–23)
CO2: 29 mmol/L (ref 22–32)
Calcium: 8.4 mg/dL — ABNORMAL LOW (ref 8.9–10.3)
Chloride: 100 mmol/L (ref 98–111)
Creatinine, Ser: 0.96 mg/dL (ref 0.44–1.00)
GFR calc Af Amer: >60 mL/min (ref >60)
GFR calc non Af Amer: >60 mL/min (ref >60)
Glucose, Bld: 131 mg/dL — ABNORMAL HIGH (ref 70–99)
Potassium: 4 mmol/L (ref 3.5–5.1)
Sodium: 140 mmol/L (ref 135–145)

## 2020-05-16 LAB — CBC
HCT: 36.1 % (ref 36.0–46.0)
Hemoglobin: 11.5 g/dL — ABNORMAL LOW (ref 12.0–15.0)
MCH: 28.8 pg (ref 26.0–34.0)
MCHC: 31.9 g/dL (ref 30.0–36.0)
MCV: 90.3 fL (ref 80.0–100.0)
Platelets: 195 10*3/uL (ref 150–400)
RBC: 4 MIL/uL (ref 3.87–5.11)
RDW: 18 % — ABNORMAL HIGH (ref 11.5–15.5)
WBC: 6.5 10*3/uL (ref 4.0–10.5)
nRBC: 0 % (ref 0.0–0.2)

## 2020-05-16 LAB — MAGNESIUM: Magnesium: 1.9 mg/dL (ref 1.7–2.4)

## 2020-05-16 MED ORDER — CHLORHEXIDINE GLUCONATE 0.12% ORAL RINSE (MEDLINE KIT): 15.0000 mL | Freq: Two times a day (BID) | OROMUCOSAL | Status: DC

## 2020-05-16 MED ORDER — LOSARTAN POTASSIUM 25 MG PO TABS
25.0000 mg | ORAL_TABLET | Freq: Every evening | ORAL | Status: DC
  Administered 2020-05-16 – 2020-05-18 (×3): 25 mg via ORAL
  Filled 2020-05-16 (×3): qty 1

## 2020-05-16 MED ORDER — MIDODRINE HCL 5 MG PO TABS
5.0000 mg | ORAL_TABLET | Freq: Three times a day (TID) | ORAL | Status: DC
  Administered 2020-05-16 – 2020-05-18 (×5): 5 mg via ORAL
  Filled 2020-05-16 (×6): qty 1

## 2020-05-16 MED ORDER — ORAL CARE MOUTH RINSE: 15.0000 mL | OROMUCOSAL | Status: DC

## 2020-05-16 NOTE — Consult Note (Signed)
PHARMACY CONSULT NOTE  Pharmacy Consult for Electrolyte Monitoring and Replacement   Recent Labs: Potassium (mmol/L)  Date Value  05/16/2020 4.0   Magnesium (mg/dL)  Date Value  03/70/4888 1.9   Calcium (mg/dL)  Date Value  91/69/4503 8.4 (L)   Albumin (g/dL)  Date Value  88/82/8003 3.0 (L)   Phosphorus (mg/dL)  Date Value  49/17/9150 3.8   Sodium (mmol/L)  Date Value  05/16/2020 140   Corrected Ca: 9.2 mg/dL  Assessment: 64 y/o female with a complex medical history presenting with acute on chronic hypoxic respiratory failure/acute COPD exacerbation, cardiogenic shock, severe erythematous rash and 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  She is currently on IV Lasix 40 mg daily  Goal of Therapy:  Potassium 4.0 - 5.1 mmol/L Magnesium 2.0 - 2.4 mg/dL All Other Electrolytes WNL  Plan:   Hypokalemia corrected, magnesium borderline and trending up   No electrolyte replacement warranted for today  Re-check electrolytes in am 7/10  Lowella Bandy ,PharmD Clinical Pharmacist 05/16/2020 7:04 AM

## 2020-05-16 NOTE — Progress Notes (Signed)
Progress Note  Patient Name: Regina White Date of Encounter: 05/16/2020  [facility] Cardiologist: Iona Coach, MD   Subjective   Sitting up in bed second day in a row, reports that she feels well, denies significant shortness of breath Hip with minimal pain if she does not move Denies significant leg swelling, has mild abdominal distention Good urine output from Foley catheter Appears to be -10 L total net negative this admission though unclear if this is accurate On Lasix IV daily  Inpatient Medications    Scheduled Meds: . acidophilus  1 capsule Oral Daily  . apixaban  2.5 mg Oral BID  . budesonide (PULMICORT) nebulizer solution  0.5 mg Nebulization BID  . Chlorhexidine Gluconate Cloth  6 each Topical Daily  . cholecalciferol  2,000 Units Oral Daily  . furosemide  40 mg Intravenous Daily  . Gerhardt's butt cream   Topical TID  . hydrocortisone sod succinate (SOLU-CORTEF) inj  50 mg Intravenous Q6H  . ipratropium-albuterol  3 mL Nebulization Q6H  . levothyroxine  200 mcg Oral Q0600  . midodrine  10 mg Oral TID WC  . montelukast  10 mg Oral QHS  . pantoprazole  40 mg Oral Daily  . silver sulfADIAZINE   Topical BID  . sodium chloride flush  10-40 mL Intracatheter Q12H  . traZODone  150 mg Oral QHS   Continuous Infusions: . sodium chloride     PRN Meds: acetaminophen, docusate sodium, fentaNYL (SUBLIMAZE) injection, guaiFENesin-dextromethorphan, HYDROcodone-acetaminophen, HYDROmorphone (DILAUDID) injection, polyethylene glycol, sodium chloride flush   Vital Signs    Vitals:   05/16/20 1200 05/16/20 1300 05/16/20 1352 05/16/20 1400  BP:      Pulse: 69 77  62  Resp: 15 18  19   Temp: 98.1 F (36.7 C) 98.1 F (36.7 C)  98.2 F (36.8 C)  TempSrc:      SpO2: 93% (!) 89% 93% 96%  Weight:      Height:        Intake/Output Summary (Last 24 hours) at 05/16/2020 1459 Last data filed at 05/16/2020 1300 Gross per 24 hour  Intake 260 ml  Output 2450 ml    Net -2190 ml   Last 3 Weights 05/16/2020 05/15/2020 05/13/2020  Weight (lbs) 226 lb 3.1 oz 239 lb 6.7 oz 239 lb 13.8 oz  Weight (kg) 102.6 kg 108.6 kg 108.8 kg      Telemetry    Normal sinus rhythm- Personally Reviewed  ECG     - Personally Reviewed  Physical Exam   Constitutional:  oriented to person, place, and time. No distress.  HENT:  Head: Grossly normal Eyes:  no discharge. No scleral icterus.  Neck: Unable to lay more supine to estimate JVD,  no carotid bruits  Cardiovascular: Regular rate and rhythm, no murmurs appreciated Pulmonary/Chest: Clear to auscultation bilaterally, no wheezes or rails Abdominal: Soft.  no distension.  no tenderness.  Musculoskeletal: Normal range of motion, did not move the right leg Neurological:  normal muscle tone. Coordination normal. No atrophy Skin: Skin warm and dry Psychiatric: normal affect, pleasant   Labs    High Sensitivity Troponin:   Recent Labs  Lab 04/24/20 2157 04/25/20 0053 04/25/20 1916 05/12/20 0846  TROPONINIHS 42* 48* 19* 10      Chemistry Recent Labs  Lab 05/12/20 0136 05/12/20 0605 05/14/20 0428 05/15/20 0420 05/16/20 0434  NA 143   < > 140 140 140  K 4.6   < > 3.5 3.8 4.0  CL  111   < > 104 103 100  CO2 23   < > 27 27 29   GLUCOSE 101*   < > 134* 84 131*  BUN 37*   < > 33* 36* 33*  CREATININE 1.23*   < > 0.93 1.09* 0.96  CALCIUM 7.8*   < > 8.2* 8.2* 8.4*  PROT 5.5*  --   --   --   --   ALBUMIN 2.6*  --  3.0* 3.0*  --   AST 9*  --   --   --   --   ALT 9  --   --   --   --   ALKPHOS 47  --   --   --   --   BILITOT 0.5  --   --   --   --   GFRNONAA 46*   < > >60 53* >60  GFRAA 53*   < > >60 >60 >60  ANIONGAP 9   < > 9 10 11    < > = values in this interval not displayed.     Hematology Recent Labs  Lab 05/14/20 0428 05/15/20 0420 05/16/20 0434  WBC 5.7 5.4 6.5  RBC 3.98 3.83* 4.00  HGB 11.4* 11.1* 11.5*  HCT 35.7* 33.7* 36.1  MCV 89.7 88.0 90.3  MCH 28.6 29.0 28.8  MCHC 31.9 32.9 31.9   RDW 18.5* 18.6* 18.0*  PLT 194 178 195    BNP Recent Labs  Lab 05/12/20 0846  BNP 564.1*     DDimer No results for input(s): DDIMER in the last 168 hours.   Radiology    No results found.  Cardiac Studies   Echocardiogram April 25, 2020  1. Left ventricular ejection fraction, by estimation, is 35 to 40%. The  left ventricle has moderately decreased function. The left ventricle  demonstrates global hypokinesis. The left ventricular internal cavity size  was mildly dilated. Left ventricular  diastolic parameters are consistent with Grade I diastolic dysfunction  (impaired relaxation).  2. Right ventricular systolic function is moderately depressed. The right  ventricular size is moderately dilated. There is severely elevated  pulmonary artery systolic pressure.  3. Left atrial size was mildly dilated.  4. Tricuspid valve regurgitation is mild to moderate.  5. The inferior vena cava is dilated in size with >50% respiratory  variability, suggesting right atrial pressure of 8 mmHg.   Patient Profile     64 y.o. female chronic systolic heart failure, EF 35 to 40%, DVT on Eliquis, COPD, morbid obesity, OSA, being seen due to hypotension and heart failure.  Assessment & Plan    1.  Urine retention Recent hospitalization for urosepsis Management per urology,  Foley catheter in place, good urine output, stable renal function  2. HFrEF EF 35-40% Exacerbated by urine outflow tract obstruction requiring Foley catheter -10 L this admission, on Lasix 40 IV daily with stable renal function Saturations in the 90s on nasal cannula oxygen -Secondary to hypotension heart failure medications have been held and she was treated with midodrine 10 mg 3 times daily -Blood pressure now stabilizing, 120 systolic on my rounds -We will look to wean midodrine down to 5 mg 3 times daily -As outpatient was taking Entresto 24/26 mg p.o. twice daily This may be too aggressive, recommend  restart with losartan 25 mg daily with monitoring of pressure  3.  Hypotension Recent secondary to possible underlying infection, pain medication Wean down midodrine to 5 mg 3 times daily  4.  Chronic respiratory failure, hypoxia Still smoking, pulmonary hypertension, Chronic hypoxia -Dramatic improvement with diuresis -Continue IV Lasix daily for now with close monitoring of renal function  5.  Urinary retention Very difficult situation given she is essentially bedbound Foley catheter in place  5.  History of left lower extremity DVT On low-dose Eliquis twice daily, high risk of DVT given sedentary state   Total encounter time more than 25 minutes  Greater than 50% was spent in counseling and coordination of care with the patient    For questions or updates, please contact [facility] Please consult [email or web address] for contact info under        Signed, Julien Nordmann, MD  05/16/2020, 2:59 PM

## 2020-05-16 NOTE — Progress Notes (Signed)
CRITICAL CARE PROGRESS NOTE    Name: MIKIAH DURALL MRN: [id number] DOB: 04-13-1956     LOS: 4   SUBJECTIVE FINDINGS & SIGNIFICANT EVENTS    Patient description:  HPI: This is a 64 y/o female with a complex medical history who was recently hospitalized for Streptococcus agalactiae bacteremia from UTI and cellulitis of the posterior thigh, back and buttocks, acute on chronic hypoxic respiratory failure, acute on chronic heart failure and severe septic shock.  PCCM is being called to see patient for acute on chronic hypoxic respiratory failure and shock.  Per EMS records, EMS was called because patient had difficulty urinating.  When EMS arrived, patient was hypoxic with SPO2 of 68% on 4 L.  She was placed on 10 L and transferred to the ED.  Upon arrival in the ED, patient was hypotensive with a blood pressure of 83/61.  Her SPO2 had improved to 88% on 12 L nasal cannula.  She was placed on a nonrebreather with some improvement in her oxygenation.  A Foley catheter was inserted and currently draining clear urine.  She was also noted to have an extensive erythematous rash that extends from her mid to lower back and buttocks. Her ED work-up was unremarkable except for severe cardiomegaly with severe right ventricular dilatation and dilatation of the pulmonary trunk on CTA chest.  She remained severely hypoxic and hypotensive hands she is being admitted to the ICU for further management. 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 She is awake and denies any chest pain, palpitations, dyspnea, nausea, vomiting and headache.  She reports taking all her medications at home as prescribed.   SIGNIFICANT EVENTS:  04/29/2020: Discharged following  hospitalization for septic shock, acute hypoxic respiratory failure and severe sepsis secondary to cellulitis and UTI 05/12/2019: Readmitted with hypotension and acute on chronic hypoxic respiratory failure 05/14/20- patient continues to ask for more narcotics, she already has fentanyl, morphine and dilaudid and still asking for more opiates, im concerned for safety with respiratory drive. 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 05/15/20 - patient clinically improved, weaning on O2. PT/OT when able. No overnight events. LLE pain adequately treated with dilaudid and vicodin with fentanyl for break through pain.  05/16/20- patient with continued LLE pain from severe OA/avascular necrosis.  Has improved significantly will work on transfer to [facility].    Lines/tubes : Urethral Catheter Tory Emerald, RN Temperature probe 16 Fr. (Active)  Indication for Insertion or Continuance of Catheter Therapy based on hourly urine output monitoring and documentation for critical condition (NOT STRICT I&O) 05/13/20 1915  Site Assessment Clean;Intact 05/13/20 2000  Catheter Maintenance Bag below level of bladder;Catheter secured;Drainage bag/tubing not touching floor;No dependent loops 05/13/20 2000  Collection Container Standard drainage bag 05/13/20 2000  Securement Method Securing device (Describe) 05/13/20 2000  Urinary Catheter Interventions (if applicable) Unclamped 05/13/20 2000  Output (mL) 200 mL 05/14/20 0700    Microbiology/Sepsis markers: Results for orders placed or performed during the hospital encounter of 05/11/20  SARS Coronavirus 2 by RT PCR (hospital order, performed in [facility] hospital lab) Nasopharyngeal Nasopharyngeal Swab     Status: None   Collection Time: 05/12/20  5:45 AM   Specimen: Nasopharyngeal Swab  Result Value Ref Range Status   SARS Coronavirus 2 NEGATIVE NEGATIVE Final    Comment: (NOTE) SARS-CoV-2 target  nucleic  acids are NOT DETECTED.  The SARS-CoV-2 RNA is generally detectable in upper and lower respiratory specimens during the acute phase of infection. The lowest concentration of SARS-CoV-2 viral copies this assay can detect is 250 copies / mL. A negative result does not preclude SARS-CoV-2 infection and should not be used as the sole basis for treatment or other patient management decisions.  A negative result may occur with improper specimen collection / handling, submission of specimen other than nasopharyngeal swab, presence of viral mutation(s) within the areas targeted by this assay, and inadequate number of viral copies (<250 copies / mL). A negative result must be combined with clinical observations, patient history, and epidemiological information.  Fact Sheet for Patients:   BoilerBrush.com.cy  Fact Sheet for Healthcare Providers: https://pope.com/  This test is not yet approved or  cleared by the [place] FDA and has been authorized for detection and/or diagnosis of SARS-CoV-2 by FDA under an Emergency Use Authorization (EUA).  This EUA will remain in effect (meaning this test can be used) for the duration of the COVID-19 declaration under Section 564(b)(1) of the Act, 21 U.S.C. section 360bbb-3(b)(1), unless the authorization is terminated or revoked sooner.  Performed at [facility], [address], [place], [place] [address]     Anti-infectives:  Anti-infectives (From admission, onward)   Start     Dose/Rate Route Frequency Ordered Stop   05/12/20 1600  vancomycin (VANCOCIN) 125 MG capsule 125 mg  Status:  Discontinued        125 mg Oral 3 times daily 05/12/20 1408 05/15/20 0731   05/12/20 1400  vancomycin (VANCOCIN) 50 mg/mL oral solution 125 mg  Status:  Discontinued        125 mg Oral Every 8 hours 05/12/20 1236 05/12/20 1408   05/12/20 1000  vancomycin (VANCOCIN) 125 MG capsule 125 mg  Status:   Discontinued        125 mg Oral 3 times daily 05/12/20 0549 05/12/20 1236       Consults: Treatment Team:  Rosalene Billings, MD     Tests / Events: TTE    PAST MEDICAL HISTORY   Past Medical History:  Diagnosis Date  . Asthma   . CHF (congestive heart failure) (HCC)   . Chronic back pain   . COPD (chronic obstructive pulmonary disease) (HCC)   . Pulmonary hypertension (HCC)      SURGICAL HISTORY   Past Surgical History:  Procedure Laterality Date  . BACK SURGERY       FAMILY HISTORY   Family History  Problem Relation Age of Onset  . Hypertension Mother   . Liver cancer Father      SOCIAL HISTORY   Social History   Tobacco Use  . Smoking status: Former Smoker    Packs/day: 1.00    Years: 30.00    Pack years: 30.00    Types: Cigarettes    Quit date: 06/09/1999    Years since quitting: 20.9  . Smokeless tobacco: Never Used  Vaping Use  . Vaping Use: Never used  Substance Use Topics  . Alcohol use: No  . Drug use: Never     MEDICATIONS   Current Medication:  Current Facility-Administered Medications:  .  0.9 %  sodium chloride infusion, 250 mL, Intravenous, Continuous, Tukov-Yual, Magdalene S, NP .  acetaminophen (TYLENOL) tablet 650 mg, 650 mg, Oral, Q4H PRN, Tukov-Yual, Magdalene S, NP, 650 mg at 05/12/20 1042 .  acidophilus (RISAQUAD) capsule 1 capsule, 1 capsule,  Oral, Daily, Tukov-Yual, Magdalene S, NP, 1 capsule at 05/16/20 0924 .  apixaban (ELIQUIS) tablet 2.5 mg, 2.5 mg, Oral, BID, Tukov-Yual, Magdalene S, NP, 2.5 mg at 05/16/20 0925 .  budesonide (PULMICORT) nebulizer solution 0.5 mg, 0.5 mg, Nebulization, BID, Tukov-Yual, Magdalene S, NP, 0.5 mg at 05/16/20 0743 .  Chlorhexidine Gluconate Cloth 2 % PADS 6 each, 6 each, Topical, Daily, Vida RiggerAleskerov, Burke Terry, MD, 6 each at 05/15/20 2246 .  cholecalciferol (VITAMIN D) tablet 2,000 Units, 2,000 Units, Oral, Daily, Tukov-Yual, Magdalene S, NP, 2,000 Units at 05/16/20  0924 .  docusate sodium (COLACE) capsule 100 mg, 100 mg, Oral, BID PRN, Tukov-Yual, Magdalene S, NP .  fentaNYL (SUBLIMAZE) injection 12.5 mcg, 12.5 mcg, Intravenous, Q4H PRN, Eugenie NorrieBlakeney, Dana G, NP, 12.5 mcg at 05/13/20 0844 .  furosemide (LASIX) injection 40 mg, 40 mg, Intravenous, Daily, Vida RiggerAleskerov, Keenen Roessner, MD, 40 mg at 05/16/20 0926 .  Gerhardt's butt cream, , Topical, TID, Vida RiggerAleskerov, Madia Carvell, MD, Given at 05/15/20 2147 .  guaiFENesin-dextromethorphan (ROBITUSSIN DM) 100-10 MG/5ML syrup 5 mL, 5 mL, Oral, Q4H PRN, Tukov-Yual, Magdalene S, NP .  HYDROcodone-acetaminophen (NORCO/VICODIN) 5-325 MG per tablet 1-2 tablet, 1-2 tablet, Oral, Q6H PRN, Eugenie NorrieBlakeney, Dana G, NP, 2 tablet at 05/16/20 0810 .  hydrocortisone sodium succinate (SOLU-CORTEF) 100 MG injection 50 mg, 50 mg, Intravenous, Q6H, Karna ChristmasAleskerov, Morton Simson, MD, 50 mg at 05/16/20 0926 .  HYDROmorphone (DILAUDID) injection 1 mg, 1 mg, Intravenous, Q4H PRN, Vida RiggerAleskerov, Binnie Vonderhaar, MD, 1 mg at 05/15/20 2148 .  ipratropium-albuterol (DUONEB) 0.5-2.5 (3) MG/3ML nebulizer solution 3 mL, 3 mL, Nebulization, Q6H, Tukov-Yual, Magdalene S, NP, 3 mL at 05/16/20 0743 .  levothyroxine (SYNTHROID) tablet 200 mcg, 200 mcg, Oral, Q0600, Tukov-Yual, Magdalene S, NP, 200 mcg at 05/16/20 0622 .  midodrine (PROAMATINE) tablet 10 mg, 10 mg, Oral, TID WC, Neftaly Inzunza, MD, 10 mg at 05/16/20 0810 .  montelukast (SINGULAIR) tablet 10 mg, 10 mg, Oral, QHS, Tukov-Yual, Magdalene S, NP, 10 mg at 05/15/20 2142 .  pantoprazole (PROTONIX) EC tablet 40 mg, 40 mg, Oral, Daily, Lowella BandyGrubb, Rodney D, RPH, 40 mg at 05/16/20 [id number] .  polyethylene glycol (MIRALAX / GLYCOLAX) packet 17 g, 17 g, Oral, Daily PRN, Tukov-Yual, Magdalene S, NP .  silver sulfADIAZINE (SILVADENE) 1 % cream, , Topical, BID, Tukov-Yual, Magdalene S, NP, Given at 05/15/20 2147 .  sodium chloride flush (NS) 0.9 % injection 10-40 mL, 10-40 mL, Intracatheter, Q12H, Loleta RoseForbach, Cory, MD, 20 mL at 05/16/20 0929 .  sodium chloride flush (NS)  0.9 % injection 10-40 mL, 10-40 mL, Intracatheter, PRN, Loleta RoseForbach, Cory, MD .  traZODone (DESYREL) tablet 150 mg, 150 mg, Oral, QHS, Tukov-Yual, Magdalene S, NP, 150 mg at 05/15/20 2245    ALLERGIES   Penicillins, Erythromycin, Levofloxacin, Lidocaine, Clindamycin, Levothyroxine, Tiotropium, Doxycycline, and Sulfa antibiotics    REVIEW OF SYSTEMS     10 point ROS done and is negative except for generalized pain and pain at left hip where patient states she has arthritis.  PHYSICAL EXAMINATION   Vital Signs: Temp:  [97.5 F (36.4 C)-99.5 F (37.5 C)] 97.7 F (36.5 C) (07/09 0900) Pulse Rate:  [55-91] 69 (07/09 0900) Resp:  [13-24] 18 (07/09 0900) BP: (97-140)/(55-86) 110/66 (07/09 0900) SpO2:  [87 %-98 %] 90 % (07/09 0900) Weight:  [102.6 kg] 102.6 kg (07/09 0500)  GENERAL:chronically ill appearing HEAD: Normocephalic, atraumatic.  EYES: Pupils equal, round, reactive to light.  No scleral icterus.  MOUTH: Moist mucosal membrane. NECK: Supple. No thyromegaly. No nodules. No JVD.  PULMONARY: crackles  at bases bilaterally CARDIOVASCULAR: S1 and S2. Regular rate and rhythm. No murmurs, rubs, or gallops.  GASTROINTESTINAL: Soft, nontender, non-distended. No masses. Positive bowel sounds. No hepatosplenomegaly.  MUSCULOSKELETAL: No swelling, clubbing, or edema. Left hip and LE pain and tenderness. Rash on back image below NEUROLOGIC: Mild distress due to acute illness.   SKIN:intact,warm,dry   PERTINENT DATA     Infusions: . sodium chloride     Scheduled Medications: . acidophilus  1 capsule Oral Daily  . apixaban  2.5 mg Oral BID  . budesonide (PULMICORT) nebulizer solution  0.5 mg Nebulization BID  . Chlorhexidine Gluconate Cloth  6 each Topical Daily  . cholecalciferol  2,000 Units Oral Daily  . furosemide  40 mg Intravenous Daily  . Gerhardt's butt cream   Topical TID  . hydrocortisone sod succinate (SOLU-CORTEF) inj  50 mg Intravenous Q6H  .  ipratropium-albuterol  3 mL Nebulization Q6H  . levothyroxine  200 mcg Oral Q0600  . midodrine  10 mg Oral TID WC  . montelukast  10 mg Oral QHS  . pantoprazole  40 mg Oral Daily  . silver sulfADIAZINE   Topical BID  . sodium chloride flush  10-40 mL Intracatheter Q12H  . traZODone  150 mg Oral QHS   PRN Medications: acetaminophen, docusate sodium, fentaNYL (SUBLIMAZE) injection, guaiFENesin-dextromethorphan, HYDROcodone-acetaminophen, HYDROmorphone (DILAUDID) injection, polyethylene glycol, sodium chloride flush Hemodynamic parameters:   Intake/Output: 07/08 0701 - 07/09 0700 In: 240 [P.O.:240] Out: 3100 [Urine:3100]  Ventilator  Settings:    LAB RESULTS:  Basic Metabolic Panel: Recent Labs  Lab 05/12/20 0136 05/12/20 0136 05/12/20 1610 05/13/20 0612 05/13/20 0612 05/14/20 0428 05/14/20 0428 05/15/20 0420 05/16/20 0434  NA 143  --   --  141  --  140  --  140 140  K 4.6   < >  --  4.6   < > 3.5   < > 3.8 4.0  CL 111  --   --  110  --  104  --  103 100  CO2 23  --   --  24  --  27  --  27 29  GLUCOSE 101*  --   --  143*  --  134*  --  84 131*  BUN 37*  --   --  31*  --  33*  --  36* 33*  CREATININE 1.23*   < > 1.23* 0.85  --  0.93  --  1.09* 0.96  CALCIUM 7.8*  --   --  8.2*  --  8.2*  --  8.2* 8.4*  MG  --   --   --  1.8  --  1.9  --  1.8 1.9  PHOS  --   --   --  3.4  --  3.5  --  3.8  --    < > = values in this interval not displayed.   Liver Function Tests: Recent Labs  Lab 05/12/20 0136 05/14/20 0428 05/15/20 0420  AST 9*  --   --   ALT 9  --   --   ALKPHOS 47  --   --   BILITOT 0.5  --   --   PROT 5.5*  --   --   ALBUMIN 2.6* 3.0* 3.0*   No results for input(s): LIPASE, AMYLASE in the last 168 hours. No results for input(s): AMMONIA in the last 168 hours. CBC: Recent Labs  Lab 05/12/20 0056 05/12/20 0056 05/12/20 9604 05/13/20 0612 05/14/20 0428 05/15/20  1610 05/16/20 0434  WBC 6.7   < > 8.2 5.0 5.7 5.4 6.5  NEUTROABS 4.8  --   --   --   --    --   --   HGB 12.2   < > 12.7 11.3* 11.4* 11.1* 11.5*  HCT 40.8   < > 41.8 34.5* 35.7* 33.7* 36.1  MCV 96.0   < > 94.4 89.4 89.7 88.0 90.3  PLT 171   < > 181 168 194 178 195   < > = values in this interval not displayed.   Cardiac Enzymes: No results for input(s): CKTOTAL, CKMB, CKMBINDEX, TROPONINI in the last 168 hours. BNP: Invalid input(s): POCBNP CBG: Recent Labs  Lab 05/12/20 0849  GLUCAP 100*       IMAGING RESULTS:  Imaging: DG Chest Port 1 View  Result Date: 05/14/2020 CLINICAL DATA:  Left hip pain EXAM: PORTABLE CHEST 1 VIEW COMPARISON:  05/12/2020 FINDINGS: Cardiomegaly and vascular pedicle widening. The pulmonary arteries especially enlarged. Interstitial prominence without Kerley lines or effusion. No consolidation. IMPRESSION: Cardiomegaly and chronic pulmonary hypertension. No edema or focal pneumonia. Electronically Signed   By: Marnee Spring M.D.   On: 05/14/2020 10:50   DG HIP PORT UNILAT WITH PELVIS 1V LEFT  Result Date: 05/14/2020 CLINICAL DATA:  Left hip pain. EXAM: DG HIP (WITH OR WITHOUT PELVIS) 1V PORT LEFT COMPARISON:  September 21, 2019. FINDINGS: No definite fracture is noted. Severe narrowing is seen involving the superior portion of the acetabulum consistent with severe degenerative joint disease. Possible sclerosis is noted in the left femoral head with deformity of the left femoral head suggesting possible avascular necrosis. Right hip is unremarkable. IMPRESSION: Severe degenerative joint disease of the left hip is noted. 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 Electronically Signed   By: Lupita Raider M.D.   On: 05/14/2020 10:46   @ No results found.         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  - patient will follow up on outpatient with [facility]  - ortho consult for any additional recommendations - patient in severe pain on high dose narcotics  Skin rash with excoriation   - patient had episodes of laying down in urine at home    - skin care /wound care   ID -continue IV abx as prescibed -follow up cultures  GI/Nutrition GI PROPHYLAXIS as indicated DIET-->TF's as tolerated Constipation protocol as indicated  ENDO - ICU hypoglycemic\Hyperglycemia protocol -check FSBS per protocol   ELECTROLYTES -follow labs as needed -replace as needed -pharmacy consultation   DVT/GI PRX ordered -SCDs  TRANSFUSIONS AS NEEDED MONITOR FSBS ASSESS the need for LABS as needed   Critical care provider statement:    Critical care time (minutes):  33   Critical care time was exclusive of:  Separately billable procedures and treating other patients   Critical care was necessary to treat or prevent imminent or life-threatening deterioration of the following conditions:  acute on chronic hypoxemic respiratory failure, CHF, obesity, left hip arthritis  Critical care was time spent personally by me on the following activities:  Development of treatment plan with patient or surrogate, discussions with consultants, evaluation of patient's response to treatment, examination of patient, obtaining history from patient or surrogate, ordering and performing treatments and interventions, ordering and review of laboratory studies and re-evaluation of patient's condition.  I assumed direction of critical care for this patient from another provider in my specialty: no    This document was prepared using Dragon voice recognition software and may  include unintentional dictation errors.    Vida Rigger, M.D.  Division of Pulmonary & Critical Care Medicine  [facility]

## 2020-05-16 NOTE — Progress Notes (Addendum)
Shift summary:  - VS WNL and stable this AM. - Report given to [facility] RN, Mitzi Davenport.

## 2020-05-16 NOTE — Plan of Care (Signed)
A&O patient, currently on 5LNC with O2 saturation goal of 85%. C/O left hip and left leg pain 9-10/10, improves to 7/10 with PRN pain medications. Patient is not able to move without pain at the present. Patient has MASD and cellulitis to BL back and buttocks cream applied per ordered. Foley catheter draining clear yellow urine. Diminished breath sounds BL. High fall risk. Patient has been kept safe this shift and free of a fall.

## 2020-05-16 NOTE — Plan of Care (Signed)

## 2020-05-17 DIAGNOSIS — I5021 Acute systolic (congestive) heart failure: Secondary | ICD-10-CM

## 2020-05-17 LAB — CBC
HCT: 37.4 % (ref 36.0–46.0)
Hemoglobin: 11.9 g/dL — ABNORMAL LOW (ref 12.0–15.0)
MCH: 28.6 pg (ref 26.0–34.0)
MCHC: 31.8 g/dL (ref 30.0–36.0)
MCV: 89.9 fL (ref 80.0–100.0)
Platelets: 198 10*3/uL (ref 150–400)
RBC: 4.16 MIL/uL (ref 3.87–5.11)
RDW: 18 % — ABNORMAL HIGH (ref 11.5–15.5)
WBC: 8.5 10*3/uL (ref 4.0–10.5)
nRBC: 0 % (ref 0.0–0.2)

## 2020-05-17 LAB — BASIC METABOLIC PANEL
Anion gap: 11 (ref 5–15)
BUN: 39 mg/dL — ABNORMAL HIGH (ref 8–23)
CO2: 33 mmol/L — ABNORMAL HIGH (ref 22–32)
Calcium: 8.7 mg/dL — ABNORMAL LOW (ref 8.9–10.3)
Chloride: 96 mmol/L — ABNORMAL LOW (ref 98–111)
Creatinine, Ser: 0.85 mg/dL (ref 0.44–1.00)
GFR calc Af Amer: >60 mL/min (ref >60)
GFR calc non Af Amer: >60 mL/min (ref >60)
Glucose, Bld: 120 mg/dL — ABNORMAL HIGH (ref 70–99)
Potassium: 3.6 mmol/L (ref 3.5–5.1)
Sodium: 140 mmol/L (ref 135–145)

## 2020-05-17 LAB — MAGNESIUM: Magnesium: 2 mg/dL (ref 1.7–2.4)

## 2020-05-17 MED ORDER — FLUCONAZOLE 100 MG PO TABS
100.0000 mg | ORAL_TABLET | Freq: Every day | ORAL | Status: AC
  Administered 2020-05-17 – 2020-05-18 (×3): 100 mg via ORAL
  Filled 2020-05-17 (×3): qty 1

## 2020-05-17 NOTE — Progress Notes (Signed)
PROGRESS NOTE    Regina White  OVF:[id number] DOB: May 12, 1956 DOA: 05/11/2020 PCP: Cain Sieve, MD    Assessment & Plan:   Active Problems:   Acute on chronic respiratory failure with hypoxia (HCC)   Hypotension   CHF (congestive heart failure) (HCC)   SOB (shortness of breath)    Regina White is a 64 y/o Caucasian female with a complex medical history who was recently hospitalized for Streptococcus agalactiae bacteremia from UTI and cellulitis of the posterior thigh, back and buttocks, acute on chronic hypoxic respiratory failure, acute on chronic heart failure and severe septic shock.   Per EMS records, EMS was called because patient had difficulty urinating. When EMS arrived, patient was hypoxic with SPO2 of 68% on 4 L. She was placed on 10 L and transferred to the ED. Upon arrival in the ED, patient was hypotensive with a blood pressure of 83/61. Her SPO2 had improved to 88% on 12 L nasal cannula. She was placed on a nonrebreather with some improvement in her oxygenation. A Foley catheter was inserted and currently draining clear urine. She was also noted to have an extensive erythematous rash that extends from her mid to lower back and buttocks. Her ED work-up was unremarkable except for severe cardiomegaly with severe right ventricular dilatation and dilatation of the pulmonary trunk on CTA chest. She remained severely hypoxic and hypotensive so was admitted to the ICU for further management.   SIGNIFICANT EVENTS:  04/29/2020: Discharged following hospitalization for septic shock, acute hypoxic respiratory failure and severe sepsis secondary to cellulitis and UTI 05/12/2019: Readmitted with hypotension and acute on chronic hypoxic respiratory failure 05/14/20- patient continues to ask for more narcotics, she already has fentanyl, morphine and dilaudid and still asking for more opiates, im concerned for safety with respiratory drive. Workup of left leg  where pain was reported is negative for dvt but XR hip with avascular necrosis and OA of hip. 05/15/20 - patient clinically improved, weaning on O2. PT/OT when able. No overnight events. LLE pain adequately treated with dilaudid and vicodin with fentanyl for break through pain.  05/16/20- patient with continued LLE pain from severe OA/avascular necrosis.  Has improved significantly  05/17/20--pt transferred to hospitalist service   Acute on chronic Hypoxic Respiratory Failure # Chronic COPD on 3L O2 at baseline -due to pulmonary edema and pulmonary hypertension with CHF PLAN: - COPD care path --continue IV lasix 40 mg daily --continue O2 to maintain sats >88%, wean as able (currently on 4L)  Acute on chronic systolic CHF with EF30% -cardiology on case - appreciate input PLAN: --continue IV lasix 40 mg daily --will wean off midodrine as able  --heart failure meds were held due to hypotension.  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  - patient will follow up on outpatient with [facility]  - ortho consult for any additional recommendations  - patient on high dose narcotics --d/c IV dilaudid today --continue Norco PRN for now  Skin rash with excoriation   - patient had episodes of laying down in urine at home    - skin care /wound care  Urinary retention, POA S/p Foley placement on presentation --Pt reported no prior hx of urinary retention PLAN: --discontinue Foley today for a voiding trial with bladder scan q6h for 24 hours  # Yeast infection --Per pt, she had symptoms of vaginal yeast infection and requested treatment. PLAN: --Start fluconazole 100 mg daily for 3 days  #  Depression --Pt expressed desire to see a psych while inpatient PLAN: --consult psych today  --Pt will need to followup with outpatient provider for continued management of her mood and psych meds  Hx of left lower extremity DVT on  Eliquis --continue eliquis 2.5 mg BID  ELECTROLYTES -follow labs as needed -replace as needed   DVT prophylaxis: QM:GNOIBBC Code Status: Full code  Family Communication: husband updated at bedside today Status is: inpatient Dispo:   The patient is from: home Anticipated d/c is to: PT rec SNF rehab, however, pt refused and wants to go home with HHPT Anticipated d/c date is: 1-2 days  Patient currently is not medically stable to d/c due to: still on IV lasix, continued cardiac workup, currently undergoing voiding trial, psych still to see pt.   Subjective and Interval History:  Pt had complained to nursing and OT about her mood and asked to see psych inpatient.    Complained of chronic MSK pain.  No fever, chest pain, abdominal pain, N/V/D, dysuria.  Having BM's.   Objective: Vitals:   05/17/20 1156 05/17/20 1421 05/17/20 1943 05/17/20 2021  BP: 129/82   127/81  Pulse: 77   80  Resp: 20   20  Temp: 97.8 F (36.6 C)   98.4 F (36.9 C)  TempSrc: Oral   Oral  SpO2: 90% 90% 92% 91%  Weight:      Height:        Intake/Output Summary (Last 24 hours) at 05/18/2020 0327 Last data filed at 05/17/2020 1900 Gross per 24 hour  Intake 480 ml  Output 2900 ml  Net -2420 ml   Filed Weights   05/15/20 0315 05/16/20 0500 05/17/20 0444  Weight: 108.6 kg 102.6 kg 105.9 kg    Examination:   Constitutional: NAD, AAOx3 HEENT: conjunctivae and lids normal, EOMI CV: RRR no M,R,G. Distal pulses +2.  No cyanosis.   RESP: CTA B/L, normal respiratory effort, on 4L (baseline 3L) GI: +BS, NTND Extremities: Mild edema and erythema in BLE SKIN: warm, dry and intact Neuro: II - XII grossly intact.  Sensation intact Psych: Normal mood and affect.     Data Reviewed: I have personally reviewed following labs and imaging studies  CBC: Recent Labs  Lab 05/12/20 0056 05/12/20 0846 05/13/20 0612 05/14/20 0428 05/15/20 0420 05/16/20 0434 05/17/20 0706  WBC 6.7   < > 5.0 5.7 5.4 6.5  8.5  NEUTROABS 4.8  --   --   --   --   --   --   HGB 12.2   < > 11.3* 11.4* 11.1* 11.5* 11.9*  HCT 40.8   < > 34.5* 35.7* 33.7* 36.1 37.4  MCV 96.0   < > 89.4 89.7 88.0 90.3 89.9  PLT 171   < > 168 194 178 195 198   < > = values in this interval not displayed.   Basic Metabolic Panel: Recent Labs  Lab 05/13/20 0612 05/14/20 0428 05/15/20 0420 05/16/20 0434 05/17/20 0706  NA 141 140 140 140 140  K 4.6 3.5 3.8 4.0 3.6  CL 110 104 103 100 96*  CO2 24 27 27 29  33*  GLUCOSE 143* 134* 84 131* 120*  BUN 31* 33* 36* 33* 39*  CREATININE 0.85 0.93 1.09* 0.96 0.85  CALCIUM 8.2* 8.2* 8.2* 8.4* 8.7*  MG 1.8 1.9 1.8 1.9 2.0  PHOS 3.4 3.5 3.8  --   --    GFR: Estimated Creatinine Clearance: 79.8 mL/min (by C-G formula based on SCr  of 0.85 mg/dL). Liver Function Tests: Recent Labs  Lab 05/12/20 0136 05/14/20 0428 05/15/20 0420  AST 9*  --   --   ALT 9  --   --   ALKPHOS 47  --   --   BILITOT 0.5  --   --   PROT 5.5*  --   --   ALBUMIN 2.6* 3.0* 3.0*   No results for input(s): LIPASE, AMYLASE in the last 168 hours. No results for input(s): AMMONIA in the last 168 hours. Coagulation Profile: No results for input(s): INR, PROTIME in the last 168 hours. Cardiac Enzymes: No results for input(s): CKTOTAL, CKMB, CKMBINDEX, TROPONINI in the last 168 hours. BNP (last 3 results) No results for input(s): PROBNP in the last 8760 hours. HbA1C: No results for input(s): HGBA1C in the last 72 hours. CBG: Recent Labs  Lab 05/12/20 0849  GLUCAP 100*   Lipid Profile: No results for input(s): CHOL, HDL, LDLCALC, TRIG, CHOLHDL, LDLDIRECT in the last 72 hours. Thyroid Function Tests: No results for input(s): TSH, T4TOTAL, FREET4, T3FREE, THYROIDAB in the last 72 hours. Anemia Panel: No results for input(s): VITAMINB12, FOLATE, FERRITIN, TIBC, IRON, RETICCTPCT in the last 72 hours. Sepsis Labs: Recent Labs  Lab 05/12/20 0056 05/12/20 0136  PROCALCITON  --  0.10  LATICACIDVEN 1.3  --      Recent Results (from the past 240 hour(s))  SARS Coronavirus 2 by RT PCR (hospital order, performed [facility] lab) Nasopharyngeal Nasopharyngeal Swab     Status: None   Collection Time: 05/12/20  5:45 AM   Specimen: Nasopharyngeal Swab  Result Value Ref Range Status   SARS Coronavirus 2 NEGATIVE NEGATIVE Final    Comment: (NOTE) SARS-CoV-2 target nucleic acids are NOT DETECTED.  The SARS-CoV-2 RNA is generally detectable in upper and lower respiratory specimens during the acute phase of infection. The lowest concentration of SARS-CoV-2 viral copies this assay can detect is 250 copies / mL. A negative result does not preclude SARS-CoV-2 infection and should not be used as the sole basis for treatment or other patient management decisions.  A negative result may occur with improper specimen collection / handling, submission of specimen other than nasopharyngeal swab, presence of viral mutation(s) within the areas targeted by this assay, and inadequate number of viral copies (<250 copies / mL). A negative result must be combined with clinical observations, patient history, and epidemiological information.  Fact Sheet for Patients:   BoilerBrush.com.cyhttps://www.fda.gov/media/136312/download  Fact Sheet for Healthcare Providers: https://pope.com/https://www.fda.gov/media/136313/download  This test is not yet approved or  cleared by the [place] FDA and has been authorized for detection and/or diagnosis of SARS-CoV-2 by FDA under an Emergency Use Authorization (EUA).  This EUA will remain in effect (meaning this test can be used) for the duration of the COVID-19 declaration under Section 564(b)(1) of the Act, 21 U.S.C. section 360bbb-3(b)(1), unless the authorization is terminated or revoked sooner.  Performed at [facility], [address] [place], [place] [address]       Radiology Studies: No results found.   Scheduled Meds: . acidophilus  1 capsule Oral Daily  .  apixaban  2.5 mg Oral BID  . budesonide (PULMICORT) nebulizer solution  0.5 mg Nebulization BID  . Chlorhexidine Gluconate Cloth  6 each Topical Daily  . cholecalciferol  2,000 Units Oral Daily  . fluconazole  100 mg Oral q1800  . furosemide  40 mg Intravenous Daily  . Gerhardt's butt cream   Topical TID  .  hydrocortisone sod succinate (SOLU-CORTEF) inj  50 mg Intravenous Q6H  . ipratropium-albuterol  3 mL Nebulization TID  . levothyroxine  200 mcg Oral Q0600  . losartan  25 mg Oral QPM  . midodrine  5 mg Oral TID WC  . montelukast  10 mg Oral QHS  . pantoprazole  40 mg Oral Daily  . silver sulfADIAZINE   Topical BID  . sodium chloride flush  10-40 mL Intracatheter Q12H  . traZODone  150 mg Oral QHS   Continuous Infusions: . sodium chloride       LOS: 6 days     Darlin Priestly, MD [facility] If 7PM-7AM, please contact night-coverage 05/18/2020, 3:27 AM

## 2020-05-17 NOTE — Evaluation (Signed)
Physical Therapy Evaluation Patient Details Name: Regina White MRN: [id number] DOB: 03-12-1956 Today's Date: 05/17/2020   History of Present Illness  64 y/o female with a complex medical history including chronic respiratory failure on 3-4Lnc at home. Pt was recently hospitalized for bacteremia from UTI and cellulitis of the posterior thigh, back and buttocks, acute on chronic hypoxic respiratory failure, acute on chronic heart failure and severe septic shock.  EMS was called because patient had difficulty urinating. When EMS arrived, patient was hypoxic with SPO2 of 68% on 4 L.  She was placed on 10 L and transferred to the ED.  Upon arrival in the ED, patient was hypotensive with a blood pressure of 83/61. Pt w/u for acute on chronic hypoxic respiratory failure and shock.Her ED work-up was unremarkable except for severe cardiomegaly with severe right ventricular dilatation and dilatation of the pulmonary trunk on CTA chest.  She remained severely hypoxic and hypotensive and was adm to ICU for further management. Pt moved to floor at time of OT evaluation. 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 Pt has ortho appt at [facility] in August 2021.  Clinical Impression  Patient is ultimately unable to complete any bed mobility, with severe pain (crying) with any LLE touch or motion. Patient produces effort and complies with cuing for hand placement to aid with this. PT educated patient on pain cycle and "anticipation pain" which patient is very understanding of, and agrees that she could benefit from pysch consult. PT  will keep patient on caseload should she be able to tolerate further intervention to increase independence. PT recommendation SNF for safety and to decrease caregiver burden. Would benefit from skilled PT to address above deficits and promote optimal return to PLOF.     Follow Up Recommendations SNF    Equipment Recommendations  None recommended by PT    Recommendations for Other Services       Precautions / Restrictions Precautions Precautions: Fall Restrictions Weight Bearing Restrictions: No      Mobility  Bed Mobility Overal bed mobility: Needs Assistance Bed Mobility: Rolling Rolling: Total assist         General bed mobility comments: Attempted roll to either side with patietn able to comply with cuing for hand placement to attempt, but unable to tolerate any motion of LLE, crying in pain  Transfers                 General transfer comment: deferred  Ambulation/Gait             General Gait Details: deferred  Stairs            Wheelchair Mobility    Modified Rankin (Stroke Patients Only)       Balance                                             Pertinent Vitals/Pain Pain Assessment: Faces Pain Score: 10-Worst pain ever Faces Pain Scale: Hurts worst Pain Location: L hip  with any movement Pain Descriptors / Indicators: Burning;Sharp;Constant;Crying Pain Intervention(s): Limited activity within patient's tolerance    Home Living Family/patient expects to be discharged to:: Private residence Living Arrangements: Spouse/significant other;Children Available Help at Discharge: Family;Available 24 hours/day Type of Home: House Home Access: Stairs to enter;Ramped entrance   Entrance Stairs-Number of Steps: Bi-level: 6 steps with B railings (not able to reach both) plus 6 steps with B railings (able to reach both) to main level. Has stair lift to navigate stairs, but has not been able to pivot onto stair lift d/t hip  pain. Home Layout: Other (Comment) Home Equipment: Walker - standard;Walker - 4 wheels;Shower seat;Grab bars - toilet;Grab bars - tub/shower;Hospital bed      Prior Function Level of Independence: Needs assistance   Gait / Transfers Assistance Needed: per Pt and family, pt uses stand/pivot equipment at home for transfers, has been unable to for the last month due to PT discontinuation and pt not feeling well/significant L hip pain  ADL's / Homemaking Assistance Needed: Assist for ADLs from husband, performed bed-level. Pt reports being total care.  Comments: Pt was able to walk up to August 2020, went to rehab 6 months, got COVID. Has primarily been bed bound since that time. Had HHPT coming since she's been home.     Hand Dominance   Dominant Hand: Right    Extremity/Trunk Assessment   Upper Extremity Assessment Upper Extremity Assessment: Defer to OT evaluation RUE Deficits / Details: shld flexion 1/2 arc of motion, pt states this UE has been weak since bleeding episode 2/2 previous Lovenox prescription. Grip grossly weak, able to close hand and minimally resist. Grip ~3+/5 LUE Deficits / Details: Shld flexion 3/4 arc of motion. Reports pain, but stronger in her L UE despite being R handed. Grip MMT grossly 4-/5. Difficulty with FM tasks, potentially d/t weakness versus actual lack of coordination.    Lower Extremity Assessment Lower Extremity Assessment: Generalized weakness LLE Deficits / Details: Pt very reluctant to move this LE in any capacity. She is unable to lift against gravity. Difficulty with FM tasks, potentially d/t weakness versus actual lack of coordination. LLE: Unable to fully assess due to pain       Communication      Cognition Arousal/Alertness: Awake/alert Behavior During Therapy: Anxious Overall Cognitive Status: Within Functional Limits for tasks assessed                                 General Comments: Pt tearful through any bed  mobility, agreeable to pysch consult      General Comments General comments (skin integrity, edema, etc.): N/A    Exercises Other Exercises Other Exercises: PT attempted roll to either side with patietn able to produce effort and comoply with UE set up, but ultimately unable to tolerate any motion or touch to LLE. PT educated patietn on hypersensitivity and pyschological factors impact on pain, which pt is very open to and agreeable for pysch consult; says "I know this takes a toll on my mental health." Other Exercises: OT facilitates education re: role of OT and pt somewhat familiar. Reports had been recieving HHPT and had OT in rehab setting last year.   Assessment/Plan    PT Assessment Patient needs continued PT services  PT Problem List Decreased strength;Decreased range of motion;Decreased knowledge of use of DME;Decreased activity tolerance;Decreased balance;Decreased mobility;Pain       PT Treatment Interventions DME instruction;Balance  training;Gait training;Neuromuscular re-education;Therapeutic activities;Therapeutic exercise;Functional mobility training;Wheelchair mobility training;Patient/family education    PT Goals (Current goals can be found in the Care Plan section)  Acute Rehab PT Goals Patient Stated Goal: Ultimate goal is to walk again, pt's goal for today is to have pain better managed. PT Goal Formulation: With patient Time For Goal Achievement: 05/12/20 Potential to Achieve Goals: Good    Frequency Min 2X/week   Barriers to discharge        Co-evaluation               AM-PAC PT "6 Clicks" Mobility  Outcome Measure Help needed turning from your back to your side while in a flat bed without using bedrails?: Total Help needed moving from lying on your back to sitting on the side of a flat bed without using bedrails?: Total Help needed moving to and from a bed to a chair (including a wheelchair)?: Total Help needed standing up from a chair using your  arms (e.g., wheelchair or bedside chair)?: Total Help needed to walk in hospital room?: Total Help needed climbing 3-5 steps with a railing? : Total 6 Click Score: 6    End of Session Equipment Utilized During Treatment: Gait belt Activity Tolerance: Patient limited by pain Patient left: in bed;with family/visitor present;with call bell/phone within reach   PT Visit Diagnosis: Muscle weakness (generalized) (M62.81);Other abnormalities of gait and mobility (R26.89);Pain Pain - Right/Left: Left Pain - part of body: Hip    Time: 1140-1158 PT Time Calculation (min) (ACUTE ONLY): 18 min   Charges:   PT Evaluation $PT Eval Moderate Complexity: 1 Mod PT Treatments $Therapeutic Activity: 8-22 mins       Hilda Lias DPT  Hilda Lias 05/17/2020, 1:37 PM

## 2020-05-17 NOTE — Evaluation (Addendum)
Occupational Therapy Evaluation Patient Details Name: Regina White MRN: [id number] DOB: 1956/09/25 Today's Date: 05/17/2020    History of Present Illness 64 y/o female with a complex medical history including chronic respiratory failure on 3-4Lnc at home. Pt was recently hospitalized for bacteremia from UTI and cellulitis of the posterior thigh, back and buttocks, acute on chronic hypoxic respiratory failure, acute on chronic heart failure and severe septic shock.  EMS was called because patient had difficulty urinating. When EMS arrived, patient was hypoxic with SPO2 of 68% on 4 L.  She was placed on 10 L and transferred to the ED.  Upon arrival in the ED, patient was hypotensive with a blood pressure of 83/61. Pt w/u for acute on chronic hypoxic respiratory failure and shock.Her ED work-up was unremarkable except for severe cardiomegaly with severe right ventricular dilatation and dilatation of the pulmonary trunk on CTA chest.  She remained severely hypoxic and hypotensive and was adm to ICU for further management. Pt moved to floor at time of OT evaluation. 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 Pt has ortho appt at [facility] in August 2021.   Clinical Impression   Pt was seen for OT evaluation this date. Prior to hospital admission, pt was requiring extensive assist from spouse for ADLs, but does report she was working with HHPT and able to come to EOB sitting which is not able to be achieved this date 2/2 pain becoming anxious-appearing with any attempts to mobilize. Pt lives in bi-level home that she  is only able to enter/exit via EMS. Reports she is unable to get to bedroom upstairs-sleeps in hospital bed in living room. Currently pt demonstrates impairments as described below (See OT problem list) which functionally limit her ability to perform ADL/self-care tasks.  Pt currently requires setup to MIN A With UB ADLs and MAX to TOTAL A with LB ADLs at bed level.  Pt would benefit from skilled OT to address noted impairments and functional limitations (see below for any additional details) in order to maximize safety and independence while minimizing falls risk and caregiver burden. Upon hospital discharge, recommend STR to maximize pt safety and return to PLOF. However, Pt is adamant about returning home with [facility] therapy. Specifically would like to resume services with "Maxine Glenn" who was her previous HHPT. Of note: pt at 88% spO2 on 3Lnc while lying in bed and reports this has been her new baseline since having COVID in Dec 2020.    Follow Up Recommendations  SNF    Equipment Recommendations  None recommended by OT (pt reports having all necessary equipment at home)    Recommendations for Other Services       Precautions / Restrictions Precautions Precautions: Fall Restrictions Weight Bearing Restrictions: No      Mobility Bed Mobility               General bed mobility comments: deferred due to pt complaints of pain; attempted to lower FOB in prep for attempt to transition to sitting and could not complete d/t pt crying/tearful d/t pain. Reports having had dilaudid this date already.  Transfers                 General transfer comment: deferred    Balance                                           ADL either performed or assessed with clinical judgement   ADL Overall ADL's : Needs assistance/impaired                                       General ADL Comments: able to perform UB ADLs at bed level with HOB elevated at setup to MIN A  (MIN A for higher reaching such as brushing hair d/t limited UE ROM) and LB ADLs with MAX to TOTAL A at bed level.     Vision Baseline Vision/History: Wears glasses Wears Glasses: At all times Patient Visual Report: No change from baseline       Perception     Praxis      Pertinent Vitals/Pain Pain Assessment: 0-10 Pain Score: 10-Worst pain ever Pain Location: L hip with any movement Pain Descriptors / Indicators: Burning;Sharp;Constant;Crying Pain Intervention(s): Limited activity within patient's tolerance;Monitored during session;Repositioned     Hand Dominance     Extremity/Trunk Assessment Upper Extremity Assessment Upper Extremity Assessment: RUE deficits/detail;LUE deficits/detail RUE Deficits / Details: shld flexion 1/2 arc of motion, pt states this UE has been weak since bleeding episode 2/2 previous Lovenox prescription. Grip grossly weak, able to close hand and minimally resist. Grip ~3+/5 LUE Deficits / Details: Shld flexion 3/4 arc of motion. Reports pain, but stronger in her L UE despite being R handed. Grip MMT grossly 4-/5. Difficulty with FM tasks, potentially d/t weakness versus actual lack of coordination.   Lower Extremity Assessment Lower Extremity Assessment: Generalized weakness;LLE deficits/detail LLE Deficits / Details: Pt very reluctant to move this LE in any capacity. She is unable to lift against gravity. Difficulty with FM tasks, potentially d/t weakness versus actual lack of coordination. LLE: Unable to fully assess due to pain       Communication     Cognition Arousal/Alertness: Awake/alert Behavior During Therapy: Anxious (tearful) Overall Cognitive Status: Within Functional Limits for tasks assessed                                 General Comments: Pt oriented and able to follow commands, but with significant anxiety related to moving at all. Pt is tearful severeal times throughout session. OT notified RN. Pt does express to  this author that she feels some sort of psych help would be beneficial.   General Comments  NT    Exercises Other Exercises Other Exercises: OT facilitates education re: importance of OOB activity, even if just sitting EOB to prevent further skin breakdown and to prevent PNA. Pt receptive. Other Exercises: OT facilitates education re: role of OT and pt somewhat familiar. Reports had been recieving HHPT and had OT in rehab setting last year.   Shoulder Instructions      Home Living Family/patient expects to be discharged to:: Private residence Living Arrangements: Spouse/significant other;Children (husband and son) Available Help at Discharge: Family;Available 24 hours/day Type of Home: House Home Access: Stairs to enter;Ramped entrance Entrance Stairs-Number of Steps: Bi-level: 6  steps with B railings (not able to reach both) plus 6 steps with B railings (able to reach both) to main level. Has stair lift to navigate stairs, but has not been able to pivot onto stair lift d/t hip pain.   Home Layout: Other (Comment) (bi- level, has BR on top floor, but has not been able to get to it. Has to be brought into/out of house by EMS and has been sleeping on hospital bed in living room.)     Bathroom Shower/Tub: Walk-in shower   Bathroom Toilet: Handicapped height     Home Equipment: Scientist, forensic - 4 wheels;Shower seat;Grab bars - toilet;Grab bars - tub/shower;Hospital bed          Prior Functioning/Environment Level of Independence: Needs assistance    ADL's / Homemaking Assistance Needed: Assist for ADLs from husband, performed bed-level. Pt reports being total care.   Comments: Pt was able to walk up to August 2020, went to rehab 6 months, got COVID. Has primarily been bed bound since that time. Had HHPT coming since she's been home.        OT Problem List: Decreased strength;Decreased range of motion;Decreased activity tolerance;Impaired balance (sitting and/or  standing);Decreased knowledge of use of DME or AE;Cardiopulmonary status limiting activity;Impaired UE functional use;Pain      OT Treatment/Interventions: Self-care/ADL training;Therapeutic exercise;DME and/or AE instruction;Patient/family education;Therapeutic activities;Balance training    OT Goals(Current goals can be found in the care plan section) Acute Rehab OT Goals Patient Stated Goal: Ultimate goal is to walk again, pt's goal for today is to have pain better managed. OT Goal Formulation: With patient Time For Goal Achievement: 05/31/20 Potential to Achieve Goals: Fair  OT Frequency: Min 2X/week   Barriers to D/C:            Co-evaluation              AM-PAC OT "6 Clicks" Daily Activity     Outcome Measure Help from another person eating meals?: A Little Help from another person taking care of personal grooming?: A Little Help from another person toileting, which includes using toliet, bedpan, or urinal?: Total Help from another person bathing (including washing, rinsing, drying)?: Total Help from another person to put on and taking off regular upper body clothing?: A Lot Help from another person to put on and taking off regular lower body clothing?: Total 6 Click Score: 11   End of Session Nurse Communication: Mobility status;Other (comment) (notified RN of pt appearing anxious/tearful)  Activity Tolerance: Patient limited by pain Patient left: in bed;with call bell/phone within reach  OT Visit Diagnosis: Other abnormalities of gait and mobility (R26.89);Muscle weakness (generalized) (M62.81);Pain Pain - Right/Left: Left Pain - part of body: Leg                Time: 1610-9604 OT Time Calculation (min): 53 min Charges:  OT General Charges $OT Visit: 1 Visit OT Evaluation $OT Eval Moderate Complexity: 1 Mod OT Treatments $Self Care/Home Management : 23-37 mins $Therapeutic Activity: 8-22 mins  Rejeana Brock, MS, OTR/L ascom [phone] 05/17/20, 12:24  PM

## 2020-05-17 NOTE — Progress Notes (Signed)
Foley Cath removed earlier. Pt has already voided with no complains.

## 2020-05-17 NOTE — TOC Progression Note (Signed)
Transition of Care [facility]) - Progression Note    Patient Details  Name: Regina White MRN: [id number] Date of Birth: February 10, 1956  Transition of Care [facility] CM/SW Contact  Ashley Royalty Lutricia Feil, RN Phone [phone] 05/17/2020, 3:04 PM  Clinical Narrative:     RN spoke with pt today husband at bedside. Discussed the recommendations for SNF for ongoing PT however pt very adamant about declining any sort of SNF placement. States she has experienced placement at a facility and was not please. Discussed other facilities for placement however pt no interested. Pt states she will return home with [facility] for ongoing home PT services-therapist Monica Tipplett [phone] is the contact person upon her discharge. NO other needs presented. States she was working with the agency prior to her admission.    Expected Discharge Plan: Home w Home Health Services Barriers to Discharge: Continued Medical Work up  Expected Discharge Plan and Services Expected Discharge Plan: Home w Home Health Services     Post Acute Care Choice: Home Health Living arrangements for the past 2 months: Single Family Home                                       Social Determinants of Health (SDOH) Interventions    Readmission Risk Interventions Readmission Risk Prevention Plan 10/21/2019  Transportation Screening Complete  Palliative Care Screening Not Applicable  Medication Review (RN Care Manager) Complete  Some recent data might be hidden

## 2020-05-17 NOTE — Progress Notes (Signed)
Progress Note  Patient Name: Regina White Date of Encounter: 05/17/2020  [facility] Cardiologist: Iona Coach, MD   Subjective   No dyspnea this am.   Inpatient Medications    Scheduled Meds: . acidophilus  1 capsule Oral Daily  . apixaban  2.5 mg Oral BID  . budesonide (PULMICORT) nebulizer solution  0.5 mg Nebulization BID  . Chlorhexidine Gluconate Cloth  6 each Topical Daily  . cholecalciferol  2,000 Units Oral Daily  . furosemide  40 mg Intravenous Daily  . Gerhardt's butt cream   Topical TID  . hydrocortisone sod succinate (SOLU-CORTEF) inj  50 mg Intravenous Q6H  . ipratropium-albuterol  3 mL Nebulization Q6H  . levothyroxine  200 mcg Oral Q0600  . losartan  25 mg Oral QPM  . midodrine  5 mg Oral TID WC  . montelukast  10 mg Oral QHS  . pantoprazole  40 mg Oral Daily  . silver sulfADIAZINE   Topical BID  . sodium chloride flush  10-40 mL Intracatheter Q12H  . traZODone  150 mg Oral QHS   Continuous Infusions: . sodium chloride     PRN Meds: acetaminophen, docusate sodium, fentaNYL (SUBLIMAZE) injection, guaiFENesin-dextromethorphan, HYDROcodone-acetaminophen, HYDROmorphone (DILAUDID) injection, polyethylene glycol, sodium chloride flush   Vital Signs    Vitals:   05/17/20 0027 05/17/20 0111 05/17/20 0444 05/17/20 0511  BP: 117/68   124/81  Pulse: 69   64  Resp: 16   20  Temp: (!) 97.4 F (36.3 C)   (!) 97.3 F (36.3 C)  TempSrc: Oral   Oral  SpO2: 92% 94%  94%  Weight:   105.9 kg   Height:        Intake/Output Summary (Last 24 hours) at 05/17/2020 0916 Last data filed at 05/16/2020 2139 Gross per 24 hour  Intake 20 ml  Output 2050 ml  Net -2030 ml   Last 3 Weights 05/17/2020 05/16/2020 05/15/2020  Weight (lbs) 233 lb 7.5 oz 226 lb 3.1 oz 239 lb 6.7 oz  Weight (kg) 105.9 kg 102.6 kg 108.6 kg      Telemetry    Sinus with PVCs- Personally Reviewed  ECG     No AM EKG- Personally Reviewed  Physical Exam    General: Well  developed, well nourished, NAD  HEENT: OP clear, mucus membranes moist  SKIN: warm, dry. No rashes. Neuro: No focal deficits  Musculoskeletal: Muscle strength 5/5 all ext  Psychiatric: Mood and affect normal  Neck: No JVD, no carotid bruits, no thyromegaly, no lymphadenopathy.  Lungs:Clear bilaterally, no wheezes, rhonci, crackles Cardiovascular: Regular rate and rhythm. No murmurs, gallops or rubs. Abdomen:Soft. Bowel sounds present. Non-tender.  Extremities: Trace bilateral lower extremity edema. Pulses are 2 + in the bilateral DP/PT.    Labs    High Sensitivity Troponin:   Recent Labs  Lab 04/24/20 2157 04/25/20 0053 04/25/20 1916 05/12/20 0846  TROPONINIHS 42* 48* 19* 10      Chemistry Recent Labs  Lab 05/12/20 0136 05/12/20 1829 05/14/20 0428 05/14/20 0428 05/15/20 0420 05/16/20 0434 05/17/20 0706  NA 143   < > 140   < > 140 140 140  K 4.6   < > 3.5   < > 3.8 4.0 3.6  CL 111   < > 104   < > 103 100 96*  CO2 23   < > 27   < > 27 29 33*  GLUCOSE 101*   < > 134*   < > 84 131*  120*  BUN 37*   < > 33*   < > 36* 33* 39*  CREATININE 1.23*   < > 0.93   < > 1.09* 0.96 0.85  CALCIUM 7.8*   < > 8.2*   < > 8.2* 8.4* 8.7*  PROT 5.5*  --   --   --   --   --   --   ALBUMIN 2.6*  --  3.0*  --  3.0*  --   --   AST 9*  --   --   --   --   --   --   ALT 9  --   --   --   --   --   --   ALKPHOS 47  --   --   --   --   --   --   BILITOT 0.5  --   --   --   --   --   --   GFRNONAA 46*   < > >60   < > 53* >60 >60  GFRAA 53*   < > >60   < > >60 >60 >60  ANIONGAP 9   < > 9   < > 10 11 11    < > = values in this interval not displayed.     Hematology Recent Labs  Lab 05/15/20 0420 05/16/20 0434 05/17/20 0706  WBC 5.4 6.5 8.5  RBC 3.83* 4.00 4.16  HGB 11.1* 11.5* 11.9*  HCT 33.7* 36.1 37.4  MCV 88.0 90.3 89.9  MCH 29.0 28.8 28.6  MCHC 32.9 31.9 31.8  RDW 18.6* 18.0* 18.0*  PLT 178 195 198    BNP Recent Labs  Lab 05/12/20 0846  BNP 564.1*     DDimer No results  for input(s): DDIMER in the last 168 hours.   Radiology    No results found.  Cardiac Studies   Echocardiogram April 25, 2020  1. Left ventricular ejection fraction, by estimation, is 35 to 40%. The  left ventricle has moderately decreased function. The left ventricle  demonstrates global hypokinesis. The left ventricular internal cavity size  was mildly dilated. Left ventricular  diastolic parameters are consistent with Grade I diastolic dysfunction  (impaired relaxation).  2. Right ventricular systolic function is moderately depressed. The right  ventricular size is moderately dilated. There is severely elevated  pulmonary artery systolic pressure.  3. Left atrial size was mildly dilated.  4. Tricuspid valve regurgitation is mild to moderate.  5. The inferior vena cava is dilated in size with >50% respiratory  variability, suggesting right atrial pressure of 8 mmHg.   Patient Profile     64 y.o. female chronic systolic heart failure, EF 35 to 40%, DVT on Eliquis, COPD, morbid obesity, OSA, being seen due to hypotension and heart failure.  Assessment & Plan    1.  Urinary retention: Recent hospitalization for urosepsis. Urology is managing. Foley in place.   2. Cardiomyopathy/Acute on chronic systolic CHF (HFrEF EF 35-40%): Diuresing well. Negative 11 liters since admission. No volume overload on exam. Could change to po Lasix today. Her heart failure meds were held due to hypotension. Losartan has been restarted. BP stable. Can wean midodrine to off. Would not restart Entresto.  3. Chronic respiratory failure secondary to CHF, morbid obesity, sleep apnea and COPD: Stable  4.  Left leg DVT: Continue Eliquis.    For questions or updates, please contact [facility] Please consult [email or web address] for contact info under  Signed, Verne Carrow, MD  05/17/2020, 9:16 AM

## 2020-05-18 DIAGNOSIS — M25552 Pain in left hip: Secondary | ICD-10-CM

## 2020-05-18 DIAGNOSIS — I5023 Acute on chronic systolic (congestive) heart failure: Secondary | ICD-10-CM

## 2020-05-18 DIAGNOSIS — I82503 Chronic embolism and thrombosis of unspecified deep veins of lower extremity, bilateral: Secondary | ICD-10-CM

## 2020-05-18 LAB — CBC
HCT: 39.8 % (ref 36.0–46.0)
Hemoglobin: 12.5 g/dL (ref 12.0–15.0)
MCH: 28.2 pg (ref 26.0–34.0)
MCHC: 31.4 g/dL (ref 30.0–36.0)
MCV: 89.8 fL (ref 80.0–100.0)
Platelets: 201 10*3/uL (ref 150–400)
RBC: 4.43 MIL/uL (ref 3.87–5.11)
RDW: 18.1 % — ABNORMAL HIGH (ref 11.5–15.5)
WBC: 7.3 10*3/uL (ref 4.0–10.5)
nRBC: 0 % (ref 0.0–0.2)

## 2020-05-18 LAB — BASIC METABOLIC PANEL
Anion gap: 11 (ref 5–15)
BUN: 43 mg/dL — ABNORMAL HIGH (ref 8–23)
CO2: 34 mmol/L — ABNORMAL HIGH (ref 22–32)
Calcium: 8.6 mg/dL — ABNORMAL LOW (ref 8.9–10.3)
Chloride: 94 mmol/L — ABNORMAL LOW (ref 98–111)
Creatinine, Ser: 0.96 mg/dL (ref 0.44–1.00)
GFR calc Af Amer: >60 mL/min (ref >60)
GFR calc non Af Amer: >60 mL/min (ref >60)
Glucose, Bld: 113 mg/dL — ABNORMAL HIGH (ref 70–99)
Potassium: 3.3 mmol/L — ABNORMAL LOW (ref 3.5–5.1)
Sodium: 139 mmol/L (ref 135–145)

## 2020-05-18 LAB — MAGNESIUM: Magnesium: 1.9 mg/dL (ref 1.7–2.4)

## 2020-05-18 MED ORDER — IPRATROPIUM-ALBUTEROL 0.5-2.5 (3) MG/3ML IN SOLN: 3.0000 mL | Freq: Four times a day (QID) | RESPIRATORY_TRACT | Status: DC | PRN

## 2020-05-18 MED ORDER — IPRATROPIUM-ALBUTEROL 0.5-2.5 (3) MG/3ML IN SOLN
3.0000 mL | Freq: Three times a day (TID) | RESPIRATORY_TRACT | Status: DC
  Administered 2020-05-18 – 2020-05-19 (×4): 3 mL via RESPIRATORY_TRACT
  Filled 2020-05-18 (×4): qty 3

## 2020-05-18 MED ORDER — TORSEMIDE 20 MG PO TABS
20.0000 mg | ORAL_TABLET | Freq: Every day | ORAL | Status: DC
  Administered 2020-05-18: 20 mg via ORAL
  Filled 2020-05-18 (×2): qty 1

## 2020-05-18 MED ORDER — POTASSIUM CHLORIDE CRYS ER 20 MEQ PO TBCR
40.0000 meq | EXTENDED_RELEASE_TABLET | Freq: Once | ORAL | Status: AC
  Administered 2020-05-18: 40 meq via ORAL
  Filled 2020-05-18: qty 2

## 2020-05-18 NOTE — Progress Notes (Signed)
[facility]  - [place] at [facility]   PATIENT NAME: Regina White    MR#:  [id number]  DATE OF BIRTH:  [birth year]  SUBJECTIVE:   Overall feeling much better. On Monday go home is what patient tells me. Mood and affect stable. Avoiding appropriately. Ate good breakfast. REVIEW OF SYSTEMS:   Review of Systems  Constitutional: Negative for chills, fever and weight loss.  HENT: Negative for ear discharge, ear pain and nosebleeds.   Eyes: Negative for blurred vision, pain and discharge.  Respiratory: Negative for sputum production, shortness of breath, wheezing and stridor.   Cardiovascular: Negative for chest pain, palpitations, orthopnea and PND.  Gastrointestinal: Negative for abdominal pain, diarrhea, nausea and vomiting.  Genitourinary: Negative for frequency and urgency.  Musculoskeletal: Positive for back pain and joint pain.  Neurological: Positive for weakness. Negative for sensory change, speech change and focal weakness.  Psychiatric/Behavioral: Negative for depression and hallucinations. The patient is not nervous/anxious.    Tolerating Diet:yes Tolerating PT: some  DRUG ALLERGIES:   Allergies  Allergen Reactions  . Penicillins Anaphylaxis    Breathing problems Did it involve swelling of the face/tongue/throat, SOB, or low BP? Yes Did it involve sudden or severe rash/hives, skin peeling, or any reaction on the inside of your mouth or nose? Unknown Did you need to seek medical attention at a hospital or doctor's office? Unknown When did it last happen?unknown If all above answers are "NO", may proceed with cephalosporin use. Patient tolerated Ceftriaxone September 2020  . Erythromycin Nausea And Vomiting and Rash    STOMACH PAIN  . Levofloxacin Other (See Comments)    Muscle aches  . Lidocaine Rash  . Clindamycin Other (See Comments)    Recurrent C diff  . Levothyroxine Other (See Comments)    Vaginal bleeding Reaction to generic (pt  currently takes name brand Synthroid)  . Tiotropium Other (See Comments)    Coughing up blood  . Doxycycline Nausea And Vomiting  . Sulfa Antibiotics Rash    VITALS:  Blood pressure 134/80, pulse (!) 52, temperature (!) 97.5 F (36.4 C), temperature source Oral, resp. rate 17, height 5\' 5"  (1.651 m), weight 105.9 kg, SpO2 91 %.  PHYSICAL EXAMINATION:   Physical Exam  GENERAL:  64 y.o.-year-old patient lying in the bed with no acute distress. Morbid Obseity EYES: Pupils equal, round, reactive to light and accommodation. No scleral icterus.   HEENT: Head atraumatic, normocephalic. Oropharynx and nasopharynx clear.  NECK:  Supple, no jugular venous distention. No thyroid enlargement, no tenderness.  LUNGS: Normal breath sounds bilaterally, no wheezing, rales, rhonchi. No use of accessory muscles of respiration.  CARDIOVASCULAR: S1, S2 normal. No murmurs, rubs, or gallops.  ABDOMEN: Soft, nontender, nondistended. Bowel sounds present. No organomegaly or mass.  EXTREMITIES: No cyanosis, clubbing or edema b/l.    NEUROLOGIC:grossly nonfocal PSYCHIATRIC:  patient is alert and oriented x 3. Mood and affect stbale SKIN:  Pressure Injury 08/05/19 Buttocks Left Stage II -  Partial thickness loss of dermis presenting as a shallow open ulcer with a red, pink wound bed without slough. quarter sized open wound, patient states area has been hurting since before she left the hos (Active)  08/05/19 1758  Location: Buttocks  Location Orientation: Left  Staging: Stage II -  Partial thickness loss of dermis presenting as a shallow open ulcer with a red, pink wound bed without slough.  Wound Description (Comments): quarter sized open wound, patient states area has been hurting since before she left  the hospital on Thursday.  Patient states she was told by hospital staff at that time that she had a "blister" on her bottom.    Present on Admission: -- (unknown)     Pressure Injury 08/05/19 Ankle Left Stage  I -  Intact skin with non-blanchable redness of a localized area usually over a bony prominence. (Active)  08/05/19 1806  Location: Ankle  Location Orientation: Left  Staging: Stage I -  Intact skin with non-blanchable redness of a localized area usually over a bony prominence. (darkness/redness)  Wound Description (Comments):   Present on Admission:      Pressure Injury 10/22/19 Sacrum Medial Stage II -  Partial thickness loss of dermis presenting as a shallow open ulcer with a red, pink wound bed without slough. (Active)  10/22/19 1957  Location: Sacrum  Location Orientation: Medial  Staging: Stage II -  Partial thickness loss of dermis presenting as a shallow open ulcer with a red, pink wound bed without slough.  Wound Description (Comments):   Present on Admission:        LABORATORY PANEL:  CBC Recent Labs  Lab 05/18/20 0621  WBC 7.3  HGB 12.5  HCT 39.8  PLT 201    Chemistries  Recent Labs  Lab 05/12/20 0136 05/12/20 0605 05/18/20 0621  NA 143   < > 139  K 4.6   < > 3.3*  CL 111   < > 94*  CO2 23   < > 34*  GLUCOSE 101*   < > 113*  BUN 37*   < > 43*  CREATININE 1.23*   < > 0.96  CALCIUM 7.8*   < > 8.6*  MG  --    < > 1.9  AST 9*  --   --   ALT 9  --   --   ALKPHOS 47  --   --   BILITOT 0.5  --   --    < > = values in this interval not displayed.   Cardiac Enzymes No results for input(s): TROPONINI in the last 168 hours. RADIOLOGY:  No results found. ASSESSMENT AND PLAN:  Regina White is a 64 y/o Caucasian female with a complex medical history who was recently hospitalized for Streptococcus agalactiae bacteremia from UTI and cellulitis of the posterior thigh, back and buttocks. EMS was called because patient had difficulty urinating. When EMS arrived, patient was hypoxic with SPO2 of 68% on 4 L. She was placed on 10 L and transferred to the ED. Upon arrival in the ED, patient was hypotensive with a blood pressure of 83/61. Her SPO2 had improved  to 88% on 12 L nasal cannula.  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 --On po fluconazole 100 mg daily for 3 days  # Depression --Pt expressed desire to see a psych while inpatient--readdressed and she is back to baseline. NO SI or feeling down. She wants to hold of psych consult. She does not want to take more meds  Hx of left lower extremity DVT on Eliquis --continue eliquis 2.5 mg BID  ELECTROLYTES -follow labs as needed -replace as needed   DVT prophylaxis: [id number] Code Status: Full code  Family Communication: husband updated at bedside today Status is: inpatient Dispo:   The patient is from: home Anticipated d/c is to: PT rec SNF rehab, however, pt refused and wants to go home with HHPT Anticipated d/c date is:  Monday  Patient currently is medically stable to d/c home. Changed to oral lasix. Will monitor one more day of vitals.  TOTAL TIME TAKING CARE OF THIS PATIENT: *20* minutes.  >50% time spent on counselling and coordination of care  Note: This dictation was prepared with Dragon dictation along with smaller phrase technology. Any transcriptional errors that result from this process are unintentional.  Enedina Finner M.D    [facility]   CC: Primary care physician; Cain Sieve, MDPatient ID: Louretta Shorten, female   DOB: 1956-06-08, 64 y.o.   MRN: [id number]

## 2020-05-18 NOTE — Progress Notes (Signed)
Progress Note  Patient Name: Regina White Date of Encounter: 05/18/2020  [facility] Cardiologist: Iona Coach, MD   Subjective   No dyspnea  Inpatient Medications    Scheduled Meds: . acidophilus  1 capsule Oral Daily  . apixaban  2.5 mg Oral BID  . budesonide (PULMICORT) nebulizer solution  0.5 mg Nebulization BID  . Chlorhexidine Gluconate Cloth  6 each Topical Daily  . cholecalciferol  2,000 Units Oral Daily  . fluconazole  100 mg Oral q1800  . furosemide  40 mg Intravenous Daily  . Gerhardt's butt cream   Topical TID  . hydrocortisone sod succinate (SOLU-CORTEF) inj  50 mg Intravenous Q6H  . ipratropium-albuterol  3 mL Nebulization TID  . levothyroxine  200 mcg Oral Q0600  . losartan  25 mg Oral QPM  . midodrine  5 mg Oral TID WC  . montelukast  10 mg Oral QHS  . pantoprazole  40 mg Oral Daily  . silver sulfADIAZINE   Topical BID  . sodium chloride flush  10-40 mL Intracatheter Q12H  . traZODone  150 mg Oral QHS   Continuous Infusions: . sodium chloride     PRN Meds: acetaminophen, docusate sodium, fentaNYL (SUBLIMAZE) injection, guaiFENesin-dextromethorphan, HYDROcodone-acetaminophen, ipratropium-albuterol, polyethylene glycol, sodium chloride flush   Vital Signs    Vitals:   05/17/20 1943 05/17/20 2021 05/18/20 0541 05/18/20 0746  BP:  127/81 134/80   Pulse:  80 63 (!) 52  Resp:  20 20 17   Temp:  98.4 F (36.9 C) (!) 97.5 F (36.4 C)   TempSrc:  Oral Oral   SpO2: 92% 91% 94% 91%  Weight:      Height:        Intake/Output Summary (Last 24 hours) at 05/18/2020 0906 Last data filed at 05/18/2020 07/19/2020 Gross per 24 hour  Intake 480 ml  Output 3250 ml  Net -2770 ml   Last 3 Weights 05/17/2020 05/16/2020 05/15/2020  Weight (lbs) 233 lb 7.5 oz 226 lb 3.1 oz 239 lb 6.7 oz  Weight (kg) 105.9 kg 102.6 kg 108.6 kg      Telemetry    Sinus- Personally Reviewed  ECG     No AM EKG- Personally Reviewed  Physical Exam   General: Well  developed, well nourished, NAD  HEENT: OP clear, mucus membranes moist  SKIN: warm, dry. No rashes. Neuro: No focal deficits  Musculoskeletal: Muscle strength 5/5 all ext  Psychiatric: Mood and affect normal  Neck: No JVD, no carotid bruits, no thyromegaly, no lymphadenopathy.  Lungs:Clear bilaterally, no wheezes, rhonci, crackles Cardiovascular: Regular rate and rhythm. No murmurs, gallops or rubs. Abdomen:Soft. Bowel sounds present. Non-tender.  Extremities: No lower extremity edema. Pulses are 2 + in the bilateral DP/PT.   Labs    High Sensitivity Troponin:   Recent Labs  Lab 04/24/20 2157 04/25/20 0053 04/25/20 1916 05/12/20 0846  TROPONINIHS 42* 48* 19* 10      Chemistry Recent Labs  Lab 05/12/20 0136 05/12/20 07/13/20 05/14/20 0428 05/14/20 0428 05/15/20 0420 05/15/20 0420 05/16/20 0434 05/17/20 0706 05/18/20 0621  NA 143   < > 140   < > 140   < > 140 140 139  K 4.6   < > 3.5   < > 3.8   < > 4.0 3.6 3.3*  CL 111   < > 104   < > 103   < > 100 96* 94*  CO2 23   < > 27   < > 27   < >  29 33* 34*  GLUCOSE 101*   < > 134*   < > 84   < > 131* 120* 113*  BUN 37*   < > 33*   < > 36*   < > 33* 39* 43*  CREATININE 1.23*   < > 0.93   < > 1.09*   < > 0.96 0.85 0.96  CALCIUM 7.8*   < > 8.2*   < > 8.2*   < > 8.4* 8.7* 8.6*  PROT 5.5*  --   --   --   --   --   --   --   --   ALBUMIN 2.6*  --  3.0*  --  3.0*  --   --   --   --   AST 9*  --   --   --   --   --   --   --   --   ALT 9  --   --   --   --   --   --   --   --   ALKPHOS 47  --   --   --   --   --   --   --   --   BILITOT 0.5  --   --   --   --   --   --   --   --   GFRNONAA 46*   < > >60   < > 53*   < > >60 >60 >60  GFRAA 53*   < > >60   < > >60   < > >60 >60 >60  ANIONGAP 9   < > 9   < > 10   < > 11 11 11    < > = values in this interval not displayed.     Hematology Recent Labs  Lab 05/16/20 0434 05/17/20 0706 05/18/20 0621  WBC 6.5 8.5 7.3  RBC 4.00 4.16 4.43  HGB 11.5* 11.9* 12.5  HCT 36.1 37.4 39.8    MCV 90.3 89.9 89.8  MCH 28.8 28.6 28.2  MCHC 31.9 31.8 31.4  RDW 18.0* 18.0* 18.1*  PLT 195 198 201    BNP Recent Labs  Lab 05/12/20 0846  BNP 564.1*     DDimer No results for input(s): DDIMER in the last 168 hours.   Radiology    No results found.  Cardiac Studies   Echocardiogram April 25, 2020  1. Left ventricular ejection fraction, by estimation, is 35 to 40%. The  left ventricle has moderately decreased function. The left ventricle  demonstrates global hypokinesis. The left ventricular internal cavity size  was mildly dilated. Left ventricular  diastolic parameters are consistent with Grade I diastolic dysfunction  (impaired relaxation).  2. Right ventricular systolic function is moderately depressed. The right  ventricular size is moderately dilated. There is severely elevated  pulmonary artery systolic pressure.  3. Left atrial size was mildly dilated.  4. Tricuspid valve regurgitation is mild to moderate.  5. The inferior vena cava is dilated in size with >50% respiratory  variability, suggesting right atrial pressure of 8 mmHg.   Patient Profile     64 y.o. female chronic systolic heart failure, EF 35 to 40%, DVT on Eliquis, COPD, morbid obesity, OSA, being seen due to hypotension and heart failure.  Assessment & Plan    1.  Urinary retention: Recent hospitalization for urosepsis. Urology is managing. Foley has been removed and she is voiding well.  2. Cardiomyopathy/Acute on chronic systolic CHF (HFrEF EF 35-40%): Diuresing well. Negative 13.8 liters since admission. She has no evidence of volume overload on exam.  -I would change to a po diuretic today. She reports that oral lasix has not worked well in the past for her. I would recommend Torsemide 20 mg daily and have her follow daily weights at home with plans for an extra 20 mg of Torsemide as needed.   - Her heart failure meds were held due to hypotension. Losartan has been restarted. BP stable.   -D/C midodrine  - Would not restart Entresto.  3. Chronic respiratory failure secondary to CHF, morbid obesity, sleep apnea and COPD: Stable  4.  Left leg DVT: Continue Eliquis.    For questions or updates, please contact [facility] Please consult [email or web address] for contact info under        Signed, Verne Carrow, MD  05/18/2020, 9:06 AM

## 2020-05-19 DIAGNOSIS — I825Y2 Chronic embolism and thrombosis of unspecified deep veins of left proximal lower extremity: Secondary | ICD-10-CM

## 2020-05-19 LAB — MAGNESIUM: Magnesium: 1.8 mg/dL (ref 1.7–2.4)

## 2020-05-19 LAB — BASIC METABOLIC PANEL
Anion gap: 13 (ref 5–15)
BUN: 52 mg/dL — ABNORMAL HIGH (ref 8–23)
CO2: 32 mmol/L (ref 22–32)
Calcium: 8.3 mg/dL — ABNORMAL LOW (ref 8.9–10.3)
Chloride: 94 mmol/L — ABNORMAL LOW (ref 98–111)
Creatinine, Ser: 1.06 mg/dL — ABNORMAL HIGH (ref 0.44–1.00)
GFR calc Af Amer: >60 mL/min (ref >60)
GFR calc non Af Amer: 55 mL/min — ABNORMAL LOW (ref >60)
Glucose, Bld: 93 mg/dL (ref 70–99)
Potassium: 3.1 mmol/L — ABNORMAL LOW (ref 3.5–5.1)
Sodium: 139 mmol/L (ref 135–145)

## 2020-05-19 MED ORDER — LOSARTAN POTASSIUM 25 MG PO TABS: 25.0000 mg | ORAL_TABLET | Freq: Every evening | ORAL | 0 refills | Status: AC

## 2020-05-19 MED ORDER — POTASSIUM CHLORIDE ER 10 MEQ PO TBCR: 20.0000 meq | EXTENDED_RELEASE_TABLET | Freq: Every day | ORAL | 0 refills | Status: AC

## 2020-05-19 MED ORDER — TORSEMIDE 20 MG PO TABS: 20.0000 mg | ORAL_TABLET | Freq: Every day | ORAL | 0 refills | Status: AC

## 2020-05-19 NOTE — TOC Transition Note (Addendum)
Transition of Care [facility]) - CM/SW Discharge Note   Patient Details  Name: Regina White MRN: [id number] Date of Birth: 1955/11/25  Transition of Care [facility]) CM/SW Contact:  Margarito Liner, LCSW Phone Number: 05/19/2020, 10:06 AM   Clinical Narrative:  Patient has orders to discharge home today. Will need EMS home. CSW confirmed address on facesheet is correct. RN will call when she is ready. CSW spoke with patient's home physical therapist and faxed her requested orders and discharge summary. Patient confirmed she is on oxygen at home. No further concerns. CSW signing off.  Final next level of care: Home w Home Health Services Barriers to Discharge: Barriers Resolved   Patient Goals and CMS Choice     Choice offered to / list presented to : NA  Discharge Placement                Patient to be transferred to facility by: EMS   Patient and family notified of of transfer: 05/19/20  Discharge Plan and Services     Post Acute Care Choice: Home Health                    HH Arranged: PT [facility] Agency: Other - See comment ([place]) Date HH Agency Contacted: 05/19/20   Representative spoke with at [facility] Agency: Maxine Glenn T[place]lett  Social Determinants of Health (SDOH) Interventions     Readmission Risk Interventions Readmission Risk Prevention Plan 10/21/2019  Transportation Screening Complete  Palliative Care Screening Not Applicable  Medication Review (RN Care Manager) Complete  Some recent data might be hidden

## 2020-05-19 NOTE — TOC Progression Note (Signed)
Transition of Care [facility]) - Progression Note    Patient Details  Name: Regina White MRN: [id number] Date of Birth: 1956-06-25  Transition of Care [facility]) CM/SW Contact  Margarito Liner, LCSW Phone Number: 05/19/2020, 8:35 AM  Clinical Narrative:  Left voicemail for patient's home physical therapist to see if she needs any orders/documentation sent to her.   Expected Discharge Plan: Home w Home Health Services Barriers to Discharge: Continued Medical Work up  Expected Discharge Plan and Services Expected Discharge Plan: Home w Home Health Services     Post Acute Care Choice: Home Health Living arrangements for the past 2 months: Single Family Home Expected Discharge Date: 05/19/20                                     Social Determinants of Health (SDOH) Interventions    Readmission Risk Interventions Readmission Risk Prevention Plan 10/21/2019  Transportation Screening Complete  Palliative Care Screening Not Applicable  Medication Review (RN Care Manager) Complete  Some recent data might be hidden

## 2020-05-19 NOTE — Discharge Summary (Signed)
[facility] [place] [facility]   PATIENT NAME: Regina White    MR#:  [id number]  DATE OF BIRTH:  October 17, 1956  DATE OF ADMISSION:  05/11/2020 ADMITTING PHYSICIAN: No admitting provider for patient encounter.  DATE OF DISCHARGE: 05/19/2020  PRIMARY CARE PHYSICIAN: Cain Sieve, MD    ADMISSION DIAGNOSIS:  Acute respiratory failure (HCC) [J96.00] Hypoxemia [R09.02] SOB (shortness of breath) [R06.02] Scald of skin [T30.0, X19.XXXA] Acute on chronic respiratory failure with hypoxia (HCC) [J96.21] Hypotension, unspecified hypotension type [I95.9]  DISCHARGE DIAGNOSIS:  Acute on Chronic hypoxic respiratory failure secondary to congestive heart failure chronic respiratory failure on chronic home oxygen morbid obesity chronic left hip pain-- patient with limited mobility, follow up  with [facility] ortho  SECONDARY DIAGNOSIS:   Past Medical History:  Diagnosis Date  . Asthma   . CHF (congestive heart failure) (HCC)   . Chronic back pain   . COPD (chronic obstructive pulmonary disease) (HCC)   . Pulmonary hypertension [facility])     HOSPITAL COURSE:   Regina N Rensingis a 64 y/oCaucasianfemale with a complex medical history who was recently hospitalized for Streptococcus agalactiae bacteremia from UTI and cellulitis of the posterior thigh, back and buttocks. EMS was called because patient had difficulty urinating. When EMS arrived, patient was hypoxic with SPO2 of 68% on 4 L. She was placed on 10 L and transferred to the ED. Upon arrival in the ED, patient was hypotensive with a blood pressure of 83/61. Her SPO2 had improved to 88% on 12 L nasal cannula.  Acute on chronic Hypoxic Respiratory Failure # Chronic COPD on 3L O2 at baseline -due to pulmonary edema and pulmonary hypertension with CHF - COPD care path --was on IV lasix 40 mg daily--change to oral torsemide 20 mg qd -KCL 20 meq daily --continue O2 to maintain sats >88%, wean as able  (currently on 4L) -on losartan -Hold Enteresto per Cardiology --started on losartan, resume low dose bisoprolol  Acute on chronic systolic CHF with EF30% [facility] cardiology on case - appreciate input --was on IV lasix 40 mg daily--po torsemide --continue Losartan, BB  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 --On po fluconazole 100 mg daily for 3 days--completed  # Depression ---readdressed and she is back to baseline. NO SI or feeling down. She wants to hold of psych consult. She does not want to take more meds  Hx ofleft lower extremity DVTon Eliquis --continue eliquis 2.5 mg BID   DVT prophylaxis:On:Eliquis Code Status:Full code Family Communication:none today Status VF:IEPPIRJJO Dispo: The patient is from:home Anticipated d/c is to:PT recSNFrehab, however, pt refused and wants to go home with HHPT Anticipated d/c date is: today  Patient currently is medically stable to d/c home.pt agreeable CONSULTS OBTAINED:  Treatment Team:  Rosalene Billings, MD  DRUG ALLERGIES:   Allergies  Allergen Reactions  . Penicillins Anaphylaxis    Breathing problems Did it involve swelling of the face/tongue/throat, SOB, or low BP? Yes Did it involve sudden or  severe rash/hives, skin peeling, or any reaction on the inside of your mouth or nose? Unknown Did you need to seek medical attention at a hospital or  doctor's office? Unknown When did it last happen?unknown If all above answers are "NO", may proceed with cephalosporin use. Patient tolerated Ceftriaxone September 2020  . Erythromycin Nausea And Vomiting and Rash    STOMACH PAIN  . Levofloxacin Other (See Comments)    Muscle aches  . Lidocaine Rash  . Clindamycin Other (See Comments)    Recurrent C diff  . Levothyroxine Other (See Comments)    Vaginal bleeding Reaction to generic (pt currently takes name brand Synthroid)  . Tiotropium Other (See Comments)    Coughing up blood  . Doxycycline Nausea And Vomiting  . Sulfa Antibiotics Rash    DISCHARGE MEDICATIONS:   Allergies as of 05/19/2020      Reactions   Penicillins Anaphylaxis   Breathing problems Did it involve swelling of the face/tongue/throat, SOB, or low BP? Yes Did it involve sudden or severe rash/hives, skin peeling, or any reaction on the inside of your mouth or nose? Unknown Did you need to seek medical attention at a hospital or doctor's office? Unknown When did it last happen?unknown If all above answers are "NO", may proceed with cephalosporin use. Patient tolerated Ceftriaxone September 2020   Erythromycin Nausea And Vomiting, Rash   STOMACH PAIN   Levofloxacin Other (See Comments)   Muscle aches   Lidocaine Rash   Clindamycin Other (See Comments)   Recurrent C diff   Levothyroxine Other (See Comments)   Vaginal bleeding Reaction to generic (pt currently takes name brand Synthroid)   Tiotropium Other (See Comments)   Coughing up blood   Doxycycline Nausea And Vomiting   Sulfa Antibiotics Rash      Medication List    STOP taking these medications   Entresto 24-26 MG Generic drug: sacubitril-valsartan   vancomycin 125 MG capsule Commonly known as: VANCOCIN     TAKE these medications   acidophilus Caps capsule Take 1 capsule by mouth daily.   albuterol 108 (90 Base) MCG/ACT inhaler Commonly known as: VENTOLIN HFA Inhale 2 puffs  into the lungs every 6 (six) hours as needed for wheezing or shortness of breath.   apixaban 2.5 MG Tabs tablet Commonly known as: ELIQUIS Take 1 tablet (2.5 mg total) by mouth 2 (two) times daily.   bisoprolol 5 MG tablet Commonly known as: ZEBETA Take 5 mg by mouth daily.   famotidine 20 MG tablet Commonly known as: PEPCID Take 1 tablet (20 mg total) by mouth 2 (two) times daily. What changed: when to take this   Fluticasone-Salmeterol 500-50 MCG/DOSE Aepb Commonly known as: ADVAIR Inhale 1 puff into the lungs 2 (two) times daily.   guaiFENesin-dextromethorphan 100-10 MG/5ML syrup Commonly known as: ROBITUSSIN DM Take 5 mLs by mouth every 4 (four) hours as needed for cough.   HYDROcodone-acetaminophen 5-325 MG tablet Commonly known as: NORCO/VICODIN Take 1 tablet by mouth every 8 (eight) hours as needed for severe pain.   levothyroxine 200 MCG tablet Commonly known as: Synthroid Take 1 tablet (200 mcg total) by mouth daily at 6 (six) AM.   losartan 25 MG tablet Commonly known as: COZAAR Take 1 tablet (25 mg total) by mouth every evening.   montelukast 10 MG tablet Commonly known as: SINGULAIR Take 1 tablet (10 mg total) by mouth at bedtime.   NON FORMULARY by Intrathecal Infusion route continuous. MORPHINE / BUPIVACAINE INTRATHECAL PUMP  polyethylene glycol 17 g packet Commonly known as: MIRALAX / GLYCOLAX Take 17 g by mouth daily as needed (constipation).   potassium chloride 10 MEQ tablet Commonly known as: KLOR-CON Take 2 tablets (20 mEq total) by mouth daily.   torsemide 20 MG tablet Commonly known as: DEMADEX Take 1 tablet (20 mg total) by mouth daily. Start taking on: May 20, 2020   traZODone 50 MG tablet Commonly known as: DESYREL Take 150 mg by mouth at bedtime.   Vitamin D 50 MCG (2000 UT) tablet Take 2,000 Units by mouth daily.            Discharge Care Instructions  (From admission, onward)         Start     Ordered   05/19/20  0000  Discharge wound care:       Comments: Resume previous wound care orders   05/19/20 0753          If you experience worsening of your admission symptoms, develop shortness of breath, life threatening emergency, suicidal or homicidal thoughts you must seek medical attention immediately by calling 911 or calling your MD immediately  if symptoms less severe.  You Must read complete instructions/literature along with all the possible adverse reactions/side effects for all the Medicines you take and that have been prescribed to you. Take any new Medicines after you have completely understood and accept all the possible adverse reactions/side effects.   Please note  You were cared for by a hospitalist during your hospital stay. If you have any questions about your discharge medications or the care you received while you were in the hospital after you are discharged, you can call the unit and asked to speak with the hospitalist on call if the hospitalist that took care of you is not available. Once you are discharged, your primary care physician will handle any further medical issues. Please note that NO REFILLS for any discharge medications will be authorized once you are discharged, as it is imperative that you return to your primary care physician (or establish a relationship with a primary care physician if you do not have one) for your aftercare needs so that they can reassess your need for medications and monitor your lab values. Today   SUBJECTIVE    Overall feels back to baseline. No respiratory distress. On 3 L nasal cannula oxygen chronic VITAL SIGNS:  Blood pressure 114/70, pulse 71, temperature 97.8 F (36.6 C), temperature source Oral, resp. rate 20, height 5\' 5"  (1.651 m), weight 100.5 kg, SpO2 91 %.  I/O:    Intake/Output Summary (Last 24 hours) at 05/19/2020 0755 Last data filed at 05/18/2020 2259 Gross per 24 hour  Intake 240 ml  Output 1500 ml  Net -1260 ml     PHYSICAL EXAMINATION:  GENERAL:  64 y.o.-year-old patient lying in the bed with no acute distress. Morbidly obese EYES: Pupils equal, round, reactive to light and accommodation. No scleral icterus.  HEENT: Head atraumatic, normocephalic. Oropharynx and nasopharynx clear.  NECK:  Supple, no jugular venous distention. No thyroid enlargement, no tenderness.  LUNGS: Normal breath sounds bilaterally, no wheezing, rales,rhonchi or crepitation. No use of accessory muscles of respiration.  CARDIOVASCULAR: S1, S2 normal. No murmurs, rubs, or gallops.  ABDOMEN: Soft, non-tender, non-distended. Bowel sounds present. No organomegaly or mass.  EXTREMITIES: No pedal edema, cyanosis, or clubbing.  NEUROLOGIC: Cranial nerves II through XII are intact. Muscle strength 5/5 in all extremities. Sensation intact. Gait not checked.  PSYCHIATRIC: The patient  is alert and oriented x 3.  SKIN:  Pressure Injury 08/05/19 Buttocks Left Stage II -  Partial thickness loss of dermis presenting as a shallow open ulcer with a red, pink wound bed without slough. quarter sized open wound, patient states area has been hurting since before she left the hos (Active)  08/05/19 1758  Location: Buttocks  Location Orientation: Left  Staging: Stage II -  Partial thickness loss of dermis presenting as a shallow open ulcer with a red, pink wound bed without slough.  Wound Description (Comments): quarter sized open wound, patient states area has been hurting since before she left the hospital on Thursday.  Patient states she was told by hospital staff at that time that she had a "blister" on her bottom.    Present on Admission: -- (unknown)     Pressure Injury 08/05/19 Ankle Left Stage I -  Intact skin with non-blanchable redness of a localized area usually over a bony prominence. (Active)  08/05/19 1806  Location: Ankle  Location Orientation: Left  Staging: Stage I -  Intact skin with non-blanchable redness of a localized area  usually over a bony prominence. (darkness/redness)  Wound Description (Comments):   Present on Admission:      Pressure Injury 10/22/19 Sacrum Medial Stage II -  Partial thickness loss of dermis presenting as a shallow open ulcer with a red, pink wound bed without slough. (Active)  10/22/19 1957  Location: Sacrum  Location Orientation: Medial  Staging: Stage II -  Partial thickness loss of dermis presenting as a shallow open ulcer with a red, pink wound bed without slough.  Wound Description (Comments):   Present on Admission:        DATA REVIEW:   CBC  Recent Labs  Lab 05/18/20 0621  WBC 7.3  HGB 12.5  HCT 39.8  PLT 201    Chemistries  Recent Labs  Lab 05/19/20 0550  NA 139  K 3.1*  CL 94*  CO2 32  GLUCOSE 93  BUN 52*  CREATININE 1.06*  CALCIUM 8.3*  MG 1.8    Microbiology Results   Recent Results (from the past 240 hour(s))  SARS Coronavirus 2 by RT PCR (hospital order, performed in [facility] hospital lab) Nasopharyngeal Nasopharyngeal Swab     Status: None   Collection Time: 05/12/20  5:45 AM   Specimen: Nasopharyngeal Swab  Result Value Ref Range Status   SARS Coronavirus 2 NEGATIVE NEGATIVE Final    Comment: (NOTE) SARS-CoV-2 target nucleic acids are NOT DETECTED.  The SARS-CoV-2 RNA is generally detectable in upper and lower respiratory specimens during the acute phase of infection. The lowest concentration of SARS-CoV-2 viral copies this assay can detect is 250 copies / mL. A negative result does not preclude SARS-CoV-2 infection and should not be used as the sole basis for treatment or other patient management decisions.  A negative result may occur with improper specimen collection / handling, submission of specimen other than nasopharyngeal swab, presence of viral mutation(s) within the areas targeted by this assay, and inadequate number of viral copies (<250 copies / mL). A negative result must be combined with clinical observations,  patient history, and epidemiological information.  Fact Sheet for Patients:   BoilerBrush.com.cy  Fact Sheet for Healthcare Providers: https://pope.com/  This test is not yet approved or  cleared by the [place] FDA and has been authorized for detection and/or diagnosis of SARS-CoV-2 by FDA under an Emergency Use Authorization (EUA).  This EUA will remain in  effect (meaning this test can be used) for the duration of the COVID-19 declaration under Section 564(b)(1) of the Act, 21 U.S.C. section 360bbb-3(b)(1), unless the authorization is terminated or revoked sooner.  Performed at [facility], [address] [place], [place] [address]     RADIOLOGY:  No results found.   CODE STATUS:     Code Status Orders  (From admission, onward)         Start     Ordered   05/12/20 0523  Full code  Continuous        05/12/20 0545        Code Status History    Date Active Date Inactive Code Status Order ID Comments User Context   04/24/2020 2026 04/29/2020 1939 Full Code [id number]  Rometta EmeryGarba, Mohammad L, MD Inpatient   10/19/2019 2110 10/26/2019 0045 Full Code [id number]  Pearson GrippeKim, James, MD ED   07/22/2019 1221 08/02/2019 2010 Full Code [id number]  Adrian SaranMody, Sital, MD ED   12/04/2018 1806 12/04/2018 1806 Full Code [id number]  Enedina FinnerPatel, Novice Vrba, MD ED   06/02/2018 1722 06/03/2018 0011 Full Code [id number]  Alford HighlandWieting, Richard, MD Inpatient   Advance Care Planning Activity       TOTAL TIME TAKING CARE OF THIS PATIENT: 40 minutes.    Enedina FinnerSona Dmitry Macomber M.D  [facility]    CC: Primary care physician; Cain SieveKlipstein, Christopher, MD

## 2020-05-19 NOTE — Progress Notes (Signed)
Regina White  A and O x 4. VSS. Pt tolerating diet well. No complaints of pain or nausea. IV removed intact, prescriptions given. Pt voiced understanding of discharge instructions with no further questions. Pt discharged via EMS.     Allergies as of 05/19/2020      Reactions   Penicillins Anaphylaxis   Breathing problems Did it involve swelling of the face/tongue/throat, SOB, or low BP? Yes Did it involve sudden or severe rash/hives, skin peeling, or any reaction on the inside of your mouth or nose? Unknown Did you need to seek medical attention at a hospital or doctor's office? Unknown When did it last happen?unknown If all above answers are "NO", may proceed with cephalosporin use. Patient tolerated Ceftriaxone September 2020   Erythromycin Nausea And Vomiting, Rash   STOMACH PAIN   Levofloxacin Other (See Comments)   Muscle aches   Lidocaine Rash   Clindamycin Other (See Comments)   Recurrent C diff   Levothyroxine Other (See Comments)   Vaginal bleeding Reaction to generic (pt currently takes name brand Synthroid)   Tiotropium Other (See Comments)   Coughing up blood   Doxycycline Nausea And Vomiting   Sulfa Antibiotics Rash      Medication List    STOP taking these medications   Entresto 24-26 MG Generic drug: sacubitril-valsartan   vancomycin 125 MG capsule Commonly known as: VANCOCIN     TAKE these medications   acidophilus Caps capsule Take 1 capsule by mouth daily.   albuterol 108 (90 Base) MCG/ACT inhaler Commonly known as: VENTOLIN HFA Inhale 2 puffs into the lungs every 6 (six) hours as needed for wheezing or shortness of breath.   apixaban 2.5 MG Tabs tablet Commonly known as: ELIQUIS Take 1 tablet (2.5 mg total) by mouth 2 (two) times daily.   bisoprolol 5 MG tablet Commonly known as: ZEBETA Take 5 mg by mouth daily.   famotidine 20 MG tablet Commonly known as: PEPCID Take 1 tablet (20 mg total) by mouth 2 (two) times daily. What  changed: when to take this   Fluticasone-Salmeterol 500-50 MCG/DOSE Aepb Commonly known as: ADVAIR Inhale 1 puff into the lungs 2 (two) times daily.   guaiFENesin-dextromethorphan 100-10 MG/5ML syrup Commonly known as: ROBITUSSIN DM Take 5 mLs by mouth every 4 (four) hours as needed for cough.   HYDROcodone-acetaminophen 5-325 MG tablet Commonly known as: NORCO/VICODIN Take 1 tablet by mouth every 8 (eight) hours as needed for severe pain.   levothyroxine 200 MCG tablet Commonly known as: Synthroid Take 1 tablet (200 mcg total) by mouth daily at 6 (six) AM.   losartan 25 MG tablet Commonly known as: COZAAR Take 1 tablet (25 mg total) by mouth every evening.   montelukast 10 MG tablet Commonly known as: SINGULAIR Take 1 tablet (10 mg total) by mouth at bedtime.   NON FORMULARY by Intrathecal Infusion route continuous. MORPHINE / BUPIVACAINE INTRATHECAL PUMP   polyethylene glycol 17 g packet Commonly known as: MIRALAX / GLYCOLAX Take 17 g by mouth daily as needed (constipation).   potassium chloride 10 MEQ tablet Commonly known as: KLOR-CON Take 2 tablets (20 mEq total) by mouth daily.   torsemide 20 MG tablet Commonly known as: DEMADEX Take 1 tablet (20 mg total) by mouth daily. Start taking on: May 20, 2020   traZODone 50 MG tablet Commonly known as: DESYREL Take 150 mg by mouth at bedtime.   Vitamin D 50 MCG (2000 UT) tablet Take 2,000 Units by mouth daily.  Discharge Care Instructions  (From admission, onward)         Start     Ordered   05/19/20 0000  Discharge wound care:       Comments: Resume previous wound care orders   05/19/20 0753          Vitals:   05/19/20 0853 05/19/20 1205  BP:  128/81  Pulse:  73  Resp:  16  Temp:  98.6 F (37 C)  SpO2: 93% 93%    [name]

## 2020-05-19 NOTE — Care Management Important Message (Signed)
Important Message  Patient Details  Name: Regina White MRN: [id number] Date of Birth: Aug 10, 1956   Medicare Important Message Given:  Yes     Johnell Comings 05/19/2020, 11:49 AM

## 2020-05-19 NOTE — TOC Progression Note (Addendum)
Transition of Care [facility]) - Progression Note    Patient Details  Name: Regina ETHINGTON MRN: [id number] Date of Birth: May 21, 1956  [facility] CM/SW Contact  Luvenia Redden, RN Phone Number: [phone] 05/18/2020, 1:30pm  Clinical Narrative:     Consulted with Dr. Allena Katz on pt needing education on HF/COPD. Dr. Allena Katz indicated this pt will go to the HF clinic for this information.  Expected Discharge Plan: Home w Home Health Services Barriers to Discharge: Continued Medical Work up  Expected Discharge Plan and Services Expected Discharge Plan: Home w Home Health Services     Post Acute Care Choice: Home Health Living arrangements for the past 2 months: Single Family Home                                       Social Determinants of Health (SDOH) Interventions    Readmission Risk Interventions Readmission Risk Prevention Plan 10/21/2019  Transportation Screening Complete  Palliative Care Screening Not Applicable  Medication Review (RN Care Manager) Complete  Some recent data might be hidden

## 2020-05-23 ENCOUNTER — Telehealth: Payer: Self-pay | Admitting: Family

## 2020-05-23 NOTE — Telephone Encounter (Signed)
LVM for patient regarding her new patient CHF Clinic appointment that was scheduled after her recent hospital discharge.  Appointment is scheduled for 8/12.   Adela Esteban, NT

## 2020-05-26 ENCOUNTER — Ambulatory Visit: Payer: Medicare Other | Admitting: Family

## 2020-06-19 ENCOUNTER — Ambulatory Visit: Payer: Medicare Other | Admitting: Family

## 2020-09-01 ENCOUNTER — Emergency Department: Payer: Medicare Other

## 2020-09-01 ENCOUNTER — Other Ambulatory Visit: Payer: Self-pay

## 2020-09-01 ENCOUNTER — Inpatient Hospital Stay
Admission: EM | Admit: 2020-09-01 | Discharge: 2020-09-08 | DRG: 917 | Disposition: E | Payer: Medicare Other | Attending: Pulmonary Disease | Admitting: Pulmonary Disease

## 2020-09-01 ENCOUNTER — Encounter: Payer: Self-pay | Admitting: Emergency Medicine

## 2020-09-01 DIAGNOSIS — E872 Acidosis: Secondary | ICD-10-CM | POA: Diagnosis present

## 2020-09-01 DIAGNOSIS — R6521 Severe sepsis with septic shock: Secondary | ICD-10-CM | POA: Diagnosis not present

## 2020-09-01 DIAGNOSIS — G9341 Metabolic encephalopathy: Secondary | ICD-10-CM | POA: Diagnosis present

## 2020-09-01 DIAGNOSIS — T40601A Poisoning by unspecified narcotics, accidental (unintentional), initial encounter: Secondary | ICD-10-CM

## 2020-09-01 DIAGNOSIS — Z66 Do not resuscitate: Secondary | ICD-10-CM | POA: Diagnosis present

## 2020-09-01 DIAGNOSIS — Z7901 Long term (current) use of anticoagulants: Secondary | ICD-10-CM | POA: Diagnosis not present

## 2020-09-01 DIAGNOSIS — J449 Chronic obstructive pulmonary disease, unspecified: Secondary | ICD-10-CM | POA: Diagnosis present

## 2020-09-01 DIAGNOSIS — Z888 Allergy status to other drugs, medicaments and biological substances status: Secondary | ICD-10-CM

## 2020-09-01 DIAGNOSIS — Z7951 Long term (current) use of inhaled steroids: Secondary | ICD-10-CM

## 2020-09-01 DIAGNOSIS — G4733 Obstructive sleep apnea (adult) (pediatric): Secondary | ICD-10-CM | POA: Diagnosis present

## 2020-09-01 DIAGNOSIS — Z882 Allergy status to sulfonamides status: Secondary | ICD-10-CM

## 2020-09-01 DIAGNOSIS — I5042 Chronic combined systolic (congestive) and diastolic (congestive) heart failure: Secondary | ICD-10-CM | POA: Diagnosis present

## 2020-09-01 DIAGNOSIS — F1120 Opioid dependence, uncomplicated: Secondary | ICD-10-CM | POA: Diagnosis present

## 2020-09-01 DIAGNOSIS — Z5329 Procedure and treatment not carried out because of patient's decision for other reasons: Secondary | ICD-10-CM | POA: Diagnosis not present

## 2020-09-01 DIAGNOSIS — Z87891 Personal history of nicotine dependence: Secondary | ICD-10-CM

## 2020-09-01 DIAGNOSIS — N179 Acute kidney failure, unspecified: Principal | ICD-10-CM

## 2020-09-01 DIAGNOSIS — Z79890 Hormone replacement therapy (postmenopausal): Secondary | ICD-10-CM

## 2020-09-01 DIAGNOSIS — E039 Hypothyroidism, unspecified: Secondary | ICD-10-CM | POA: Diagnosis present

## 2020-09-01 DIAGNOSIS — M879 Osteonecrosis, unspecified: Secondary | ICD-10-CM | POA: Diagnosis present

## 2020-09-01 DIAGNOSIS — Z8616 Personal history of COVID-19: Secondary | ICD-10-CM | POA: Diagnosis not present

## 2020-09-01 DIAGNOSIS — J9601 Acute respiratory failure with hypoxia: Secondary | ICD-10-CM | POA: Diagnosis present

## 2020-09-01 DIAGNOSIS — T402X1A Poisoning by other opioids, accidental (unintentional), initial encounter: Principal | ICD-10-CM | POA: Diagnosis present

## 2020-09-01 DIAGNOSIS — N390 Urinary tract infection, site not specified: Secondary | ICD-10-CM | POA: Diagnosis present

## 2020-09-01 DIAGNOSIS — W19XXXA Unspecified fall, initial encounter: Secondary | ICD-10-CM

## 2020-09-01 DIAGNOSIS — Z881 Allergy status to other antibiotic agents status: Secondary | ICD-10-CM

## 2020-09-01 DIAGNOSIS — M1612 Unilateral primary osteoarthritis, left hip: Secondary | ICD-10-CM | POA: Diagnosis present

## 2020-09-01 DIAGNOSIS — G8929 Other chronic pain: Secondary | ICD-10-CM | POA: Diagnosis present

## 2020-09-01 DIAGNOSIS — I110 Hypertensive heart disease with heart failure: Secondary | ICD-10-CM | POA: Diagnosis present

## 2020-09-01 DIAGNOSIS — Z86718 Personal history of other venous thrombosis and embolism: Secondary | ICD-10-CM | POA: Diagnosis not present

## 2020-09-01 DIAGNOSIS — Z79899 Other long term (current) drug therapy: Secondary | ICD-10-CM | POA: Diagnosis not present

## 2020-09-01 DIAGNOSIS — A419 Sepsis, unspecified organism: Secondary | ICD-10-CM | POA: Diagnosis not present

## 2020-09-01 DIAGNOSIS — I959 Hypotension, unspecified: Secondary | ICD-10-CM | POA: Diagnosis present

## 2020-09-01 DIAGNOSIS — I272 Pulmonary hypertension, unspecified: Secondary | ICD-10-CM | POA: Diagnosis present

## 2020-09-01 DIAGNOSIS — Z880 Allergy status to penicillin: Secondary | ICD-10-CM

## 2020-09-01 DIAGNOSIS — Z7401 Bed confinement status: Secondary | ICD-10-CM

## 2020-09-01 LAB — CBC WITH DIFFERENTIAL/PLATELET
Abs Immature Granulocytes: 0.05 10*3/uL (ref 0.00–0.07)
Basophils Absolute: 0.1 10*3/uL (ref 0.0–0.1)
Basophils Relative: 1 %
Eosinophils Absolute: 0 10*3/uL (ref 0.0–0.5)
Eosinophils Relative: 0 %
HCT: 45.5 % (ref 36.0–46.0)
Hemoglobin: 14 g/dL (ref 12.0–15.0)
Immature Granulocytes: 0 %
Lymphocytes Relative: 7 %
Lymphs Abs: 0.9 10*3/uL (ref 0.7–4.0)
MCH: 28.5 pg (ref 26.0–34.0)
MCHC: 30.8 g/dL (ref 30.0–36.0)
MCV: 92.7 fL (ref 80.0–100.0)
Monocytes Absolute: 0.5 10*3/uL (ref 0.1–1.0)
Monocytes Relative: 4 %
Neutro Abs: 10.9 10*3/uL — ABNORMAL HIGH (ref 1.7–7.7)
Neutrophils Relative %: 88 %
Platelets: 220 10*3/uL (ref 150–400)
RBC: 4.91 MIL/uL (ref 3.87–5.11)
RDW: 16.1 % — ABNORMAL HIGH (ref 11.5–15.5)
WBC: 12.4 10*3/uL — ABNORMAL HIGH (ref 4.0–10.5)
nRBC: 0 % (ref 0.0–0.2)

## 2020-09-01 LAB — URINALYSIS, COMPLETE (UACMP) WITH MICROSCOPIC
Bilirubin Urine: NEGATIVE
Glucose, UA: NEGATIVE mg/dL
Ketones, ur: NEGATIVE mg/dL
Nitrite: NEGATIVE
Protein, ur: 100 mg/dL — AB
Specific Gravity, Urine: 1.02 (ref 1.005–1.030)
pH: 5 (ref 5.0–8.0)

## 2020-09-01 LAB — COMPREHENSIVE METABOLIC PANEL
ALT: 9 U/L (ref 0–44)
AST: 10 U/L — ABNORMAL LOW (ref 15–41)
Albumin: 3.6 g/dL (ref 3.5–5.0)
Alkaline Phosphatase: 55 U/L (ref 38–126)
Anion gap: 8 (ref 5–15)
BUN: 59 mg/dL — ABNORMAL HIGH (ref 8–23)
CO2: 21 mmol/L — ABNORMAL LOW (ref 22–32)
Calcium: 8.4 mg/dL — ABNORMAL LOW (ref 8.9–10.3)
Chloride: 105 mmol/L (ref 98–111)
Creatinine, Ser: 2.4 mg/dL — ABNORMAL HIGH (ref 0.44–1.00)
GFR, Estimated: 22 mL/min — ABNORMAL LOW (ref >60)
Glucose, Bld: 153 mg/dL — ABNORMAL HIGH (ref 70–99)
Potassium: 5.9 mmol/L — ABNORMAL HIGH (ref 3.5–5.1)
Sodium: 134 mmol/L — ABNORMAL LOW (ref 135–145)
Total Bilirubin: 0.6 mg/dL (ref 0.3–1.2)
Total Protein: 6.5 g/dL (ref 6.5–8.1)

## 2020-09-01 LAB — URINALYSIS, MICROSCOPIC (REFLEX)
RBC / HPF: >50 RBC/hpf (ref 0–5)
Squamous Epithelial / HPF: NONE SEEN (ref 0–5)
WBC, UA: >50 WBC/hpf (ref 0–5)

## 2020-09-01 LAB — PROTIME-INR
INR: 1.1 (ref 0.8–1.2)
Prothrombin Time: 13.8 seconds (ref 11.4–15.2)

## 2020-09-01 LAB — PROCALCITONIN: Procalcitonin: <0.1 ng/mL

## 2020-09-01 LAB — CORTISOL: Cortisol, Plasma: 33.4 ug/dL

## 2020-09-01 LAB — APTT: aPTT: 31 seconds (ref 24–36)

## 2020-09-01 LAB — BLOOD GAS, VENOUS
Acid-base deficit: 10.6 mmol/L — ABNORMAL HIGH (ref 0.0–2.0)
Bicarbonate: 19.4 mmol/L — ABNORMAL LOW (ref 20.0–28.0)
O2 Saturation: 19.9 %
Patient temperature: 37
pCO2, Ven: 61 mmHg — ABNORMAL HIGH (ref 44.0–60.0)
pH, Ven: 7.11 — CL (ref 7.250–7.430)
pO2, Ven: <31 mmHg — CL (ref 32.0–45.0)

## 2020-09-01 LAB — SAMPLE TO BLOOD BANK

## 2020-09-01 LAB — LACTIC ACID, PLASMA
Lactic Acid, Venous: 1.8 mmol/L (ref 0.5–1.9)
Lactic Acid, Venous: 1.6 mmol/L (ref 0.5–1.9)

## 2020-09-01 LAB — GLUCOSE, CAPILLARY: Glucose-Capillary: 151 mg/dL — ABNORMAL HIGH (ref 70–99)

## 2020-09-01 LAB — TSH: TSH: 0.672 u[IU]/mL (ref 0.350–4.500)

## 2020-09-01 MED ORDER — METRONIDAZOLE IN NACL 5-0.79 MG/ML-% IV SOLN
500.0000 mg | Freq: Once | INTRAVENOUS | Status: AC
  Administered 2020-09-01: 500 mg via INTRAVENOUS
  Filled 2020-09-01: qty 100

## 2020-09-01 MED ORDER — SODIUM CHLORIDE 0.9 % IV SOLN: Freq: Once | INTRAVENOUS | Status: AC

## 2020-09-01 MED ORDER — IPRATROPIUM-ALBUTEROL 0.5-2.5 (3) MG/3ML IN SOLN
3.0000 mL | Freq: Once | RESPIRATORY_TRACT | Status: AC
  Administered 2020-09-01: 3 mL via RESPIRATORY_TRACT
  Filled 2020-09-01: qty 3

## 2020-09-01 MED ORDER — NALOXONE HCL 2 MG/2ML IJ SOSY
2.0000 mg | PREFILLED_SYRINGE | Freq: Once | INTRAMUSCULAR | Status: AC
  Administered 2020-09-01: 2 mg via INTRAVENOUS

## 2020-09-01 MED ORDER — NALOXONE HCL 2 MG/2ML IJ SOSY
2.0000 mg | PREFILLED_SYRINGE | Freq: Once | INTRAMUSCULAR | Status: AC
  Filled 2020-09-01: qty 2

## 2020-09-01 MED ORDER — VANCOMYCIN HCL IN DEXTROSE 1-5 GM/200ML-% IV SOLN
1000.0000 mg | Freq: Once | INTRAVENOUS | Status: AC
  Administered 2020-09-01: 1000 mg via INTRAVENOUS
  Filled 2020-09-01: qty 200

## 2020-09-01 MED ORDER — SODIUM CHLORIDE 0.9 % IV SOLN: 250.0000 mL | INTRAVENOUS | Status: DC

## 2020-09-01 MED ORDER — LACTATED RINGERS IV BOLUS (SEPSIS)
800.0000 mL | Freq: Once | INTRAVENOUS | Status: AC
  Administered 2020-09-01: 800 mL via INTRAVENOUS

## 2020-09-01 MED ORDER — LACTATED RINGERS IV BOLUS (SEPSIS)
1000.0000 mL | Freq: Once | INTRAVENOUS | Status: AC
  Administered 2020-09-01: 1000 mL via INTRAVENOUS

## 2020-09-01 MED ORDER — SODIUM CHLORIDE 0.9 % IV SOLN
2.0000 g | Freq: Once | INTRAVENOUS | Status: AC
  Administered 2020-09-01: 2 g via INTRAVENOUS
  Filled 2020-09-01: qty 2

## 2020-09-01 MED ORDER — LACTATED RINGERS IV SOLN: INTRAVENOUS | Status: DC

## 2020-09-01 MED ORDER — CALCIUM GLUCONATE-NACL 1-0.675 GM/50ML-% IV SOLN
1.0000 g | Freq: Once | INTRAVENOUS | Status: AC
  Administered 2020-09-01: 1000 mg via INTRAVENOUS
  Filled 2020-09-01: qty 50

## 2020-09-01 MED ORDER — SODIUM CHLORIDE 0.9 % IV SOLN: 2.0000 g | Freq: Once | INTRAVENOUS | Status: DC

## 2020-09-01 MED ORDER — PANTOPRAZOLE SODIUM 40 MG IV SOLR
40.0000 mg | Freq: Every day | INTRAVENOUS | Status: DC
  Administered 2020-09-01: 40 mg via INTRAVENOUS
  Filled 2020-09-01: qty 40

## 2020-09-01 MED ORDER — HEPARIN SODIUM (PORCINE) 5000 UNIT/ML IJ SOLN
5000.0000 [IU] | Freq: Two times a day (BID) | INTRAMUSCULAR | Status: DC
  Administered 2020-09-01: 5000 [IU] via SUBCUTANEOUS
  Filled 2020-09-01: qty 1

## 2020-09-01 MED ORDER — INSULIN ASPART 100 UNIT/ML ~~LOC~~ SOLN
5.0000 [IU] | Freq: Once | SUBCUTANEOUS | Status: AC
  Administered 2020-09-01: 5 [IU] via INTRAVENOUS
  Filled 2020-09-01: qty 1

## 2020-09-01 MED ORDER — NOREPINEPHRINE 4 MG/250ML-% IV SOLN
2.0000 ug/min | INTRAVENOUS | Status: DC
  Administered 2020-09-01: 5 ug/min via INTRAVENOUS
  Administered 2020-09-02: 10 ug/min via INTRAVENOUS
  Filled 2020-09-01 (×2): qty 250
  Filled 2020-09-01: qty 1000
  Filled 2020-09-01: qty 250

## 2020-09-01 MED ORDER — DOCUSATE SODIUM 100 MG PO CAPS: 100.0000 mg | ORAL_CAPSULE | Freq: Two times a day (BID) | ORAL | Status: DC | PRN

## 2020-09-01 MED ORDER — ONDANSETRON HCL 4 MG/2ML IJ SOLN: 4.0000 mg | Freq: Four times a day (QID) | INTRAMUSCULAR | Status: DC | PRN

## 2020-09-01 MED ORDER — NALOXONE HCL 2 MG/2ML IJ SOSY
PREFILLED_SYRINGE | INTRAMUSCULAR | Status: AC
  Administered 2020-09-01: 2 mg via INTRAVENOUS
  Filled 2020-09-01: qty 2

## 2020-09-01 MED ORDER — SODIUM CHLORIDE 0.9 % IV BOLUS
1000.0000 mL | Freq: Once | INTRAVENOUS | Status: AC
  Administered 2020-09-01: 1000 mL via INTRAVENOUS

## 2020-09-01 MED ORDER — LACTATED RINGERS IV BOLUS
1000.0000 mL | Freq: Once | INTRAVENOUS | Status: AC
  Administered 2020-09-01: 1000 mL via INTRAVENOUS

## 2020-09-01 MED ORDER — STERILE WATER FOR INJECTION IV SOLN
INTRAVENOUS | Status: DC
  Filled 2020-09-01: qty 150
  Filled 2020-09-01 (×2): qty 850
  Filled 2020-09-01: qty 150
  Filled 2020-09-01: qty 850
  Filled 2020-09-01: qty 150

## 2020-09-01 MED ORDER — DEXTROSE 50 % IV SOLN
25.0000 mL | Freq: Once | INTRAVENOUS | Status: AC
  Administered 2020-09-01: 25 mL via INTRAVENOUS
  Filled 2020-09-01: qty 50

## 2020-09-01 MED ORDER — POLYETHYLENE GLYCOL 3350 17 G PO PACK: 17.0000 g | PACK | Freq: Every day | ORAL | Status: DC | PRN

## 2020-09-01 MED ORDER — SODIUM CHLORIDE 0.9 % IV SOLN
250.0000 mL | INTRAVENOUS | Status: DC
  Administered 2020-09-01: 250 mL via INTRAVENOUS

## 2020-09-01 NOTE — ED Triage Notes (Addendum)
Pt arrived via [facility]. Pt c/o lethargy. Per EMS, pt is bedridden at home.   Per EMS, pt had morphine on Friday from pain clinic as a "restart dose" due to necrotic hip. Per EMS, pt was weaned off morphine before but just restarted.   Per EMS BP 90/60, 77% O2 on 4L, cyanotic in toes and forehead.   Pt got 1 mg narcan and NS en route.

## 2020-09-01 NOTE — Progress Notes (Signed)
PHARMACY -  BRIEF ANTIBIOTIC NOTE   Pharmacy has received consult(s) for Aztreonam and vancomycin from an ED provider.  The patient's profile has been reviewed for ht/wt/allergies/indication/available labs.  Patient has reported PCN of anaphylaxis but has tolerated cefepime in the past. Aztreonam will be changed to cefepime.    One time order(s) placed for Cefepime and Vancomycin 1000mg  x 2 for a total loading dose of 2000mg .   Further antibiotics/pharmacy consults should be ordered by admitting physician if indicated.                       Thank you, , PharmD, BCPS Clinical Pharmacist 2020-09-11 4:50 PM

## 2020-09-01 NOTE — ED Notes (Signed)
Pt placed on BIPAP, RT and MD at bedside

## 2020-09-01 NOTE — ED Notes (Signed)
Husband at bedside talking with ICU provider. ICU provider is Joneen Roach

## 2020-09-01 NOTE — ED Notes (Signed)
ICU provider at bedside. ICU provider would like an ABG. RT called and stated they will come. As per ICU providers, pt is okay to have oxygen saturations in the 80s%. ICU providers stated they will come and see the pt again they just want an ABG first.

## 2020-09-01 NOTE — ED Notes (Signed)
Called RT about pt needing duoneb while on bipap

## 2020-09-01 NOTE — ED Notes (Signed)
Sam RN called lab to add on procalcitonin

## 2020-09-01 NOTE — ED Notes (Signed)
ICU provider and RT unable to obtain ABG

## 2020-09-01 NOTE — ED Notes (Signed)
Pt back from X-ray.  

## 2020-09-01 NOTE — ED Notes (Signed)
Main pharmacy to send sodium bicarb

## 2020-09-01 NOTE — ED Notes (Addendum)
EDP Isaacs at bedside at this time, bagging pt at this time

## 2020-09-01 NOTE — ED Provider Notes (Signed)
[facility] Emergency Department Provider Note  ____________________________________________   First MD Initiated Contact with Patient 08/29/2020 1622     (approximate)  I have reviewed the triage vital signs and the nursing notes.   HISTORY  Chief Complaint Shortness of Breath    HPI Regina White is a 64 y.o. female  With CHF, COPD, Pulm HTN, chronic pain 2/2 hip arthritis/degeneration, here with AMS. Per EMS report, pt was given a dose of morphine today for the first time in months, due to ongoing hip pain. Pt reporteldy was found confused, drowsy, and barely responsive by EMS. Per EMS, pt was initially hypoxic, drowsy but had initial improvement with O2. Shortly thereafter, pt became less responsive despite getting narcan 1 mg. Arrives minimally responsive, with Girard in place.  Level 5 caveat invoked as remainder of history, ROS, and physical exam limited due to patient's AMS.         Past Medical History:  Diagnosis Date  . Asthma   . CHF (congestive heart failure) ([place])   . Chronic back pain   . COPD (chronic obstructive pulmonary disease) ([place])   . Pulmonary hypertension [facility])     Patient Active Problem List   Diagnosis Date Noted  . Acute on chronic systolic CHF (congestive heart failure), NYHA class 3 ([place])   . Left hip pain   . CHF (congestive heart failure) ([place])   . SOB (shortness of breath)   . Acute on chronic respiratory failure with hypoxia ([place]) 05/12/2020  . Hypotension   . Acute on chronic systolic congestive heart failure ([place])   . Goals of care, counseling/discussion   . Palliative care by specialist   . Sepsis secondary to UTI ([place]) 04/24/2020  . Sepsis ([place]) 04/24/2020  . Pressure injury of skin 10/23/2019  . COVID-19 virus infection 10/19/2019  . Hyponatremia 10/19/2019  . Hypovolemic shock ([place]) 07/22/2019  . Acute respiratory failure with hypoxemia ([place]) 12/04/2018  . Acute respiratory failure with hypoxia ([place])  06/02/2018  . Diarrhea of presumed infectious origin 02/17/2016  . Right acute serous otitis media 02/17/2016  . COPD (chronic obstructive pulmonary disease) ([place]) 12/23/2015  . Heart failure with preserved ejection fraction ([place]) 12/23/2015  . DDD (degenerative disc disease) 09/25/2013  . Narrowing of intervertebral disc space 09/25/2013  . OSA (obstructive sleep apnea) 07/25/2013  . Deep vein thrombosis (DVT) ([place]) 06/25/2013  . Erythema 06/18/2013  . Left leg pain 06/18/2013  . Mediastinal lymphadenopathy 06/18/2013  . DVT, lower extremity ([place]) 06/14/2013  . Pulmonary hypertension ([place]) 06/14/2013  . Asthma 09/21/2010  . Chronic pain 09/21/2010  . Hypertension, benign 09/21/2010  . Hypothyroidism 09/21/2010  . Venous stasis 09/21/2010    Past Surgical History:  Procedure Laterality Date  . BACK SURGERY      Prior to Admission medications   Medication Sig Start Date End Date Taking? Authorizing Provider  acidophilus (RISAQUAD) CAPS capsule Take 1 capsule by mouth daily. 04/29/20  Yes Fritzi Mandes, MD  albuterol (VENTOLIN HFA) 108 (90 Base) MCG/ACT inhaler Inhale 2 puffs into the lungs every 6 (six) hours as needed for wheezing or shortness of breath. 10/25/19  Yes Patrecia Pour, MD  apixaban (ELIQUIS) 2.5 MG TABS tablet Take 1 tablet (2.5 mg total) by mouth 2 (two) times daily. 10/25/19  Yes Patrecia Pour, MD  ATROVENT HFA 17 MCG/ACT inhaler Inhale 2 puffs into the lungs 2 (two) times daily. 08/28/20  Yes [provider]  bisoprolol (ZEBETA) 5 MG tablet  Take 5 mg by mouth daily. 03/12/20  Yes [provider]  Cholecalciferol (VITAMIN D) 50 MCG (2000 UT) tablet Take 2,000 Units by mouth daily.   Yes [provider]  DULoxetine (CYMBALTA) 20 MG capsule Take 20 mg by mouth 2 (two) times daily. 08/15/20  Yes [provider]  ENTRESTO 24-26 MG Take 1 tablet by mouth 2 (two) times daily. 08/09/20  Yes [provider]  Fluticasone-Salmeterol  (ADVAIR) 500-50 MCG/DOSE AEPB Inhale 1 puff into the lungs 2 (two) times daily. 10/25/19  Yes Patrecia Pour, MD  levothyroxine (SYNTHROID) 200 MCG tablet Take 1 tablet (200 mcg total) by mouth daily at 6 (six) AM. 10/25/19  Yes Patrecia Pour, MD  losartan (COZAAR) 25 MG tablet Take 1 tablet (25 mg total) by mouth every evening. 05/19/20  Yes Fritzi Mandes, MD  montelukast (SINGULAIR) 10 MG tablet Take 1 tablet (10 mg total) by mouth at bedtime. 10/25/19  Yes Patrecia Pour, MD  morphine (MSIR) 15 MG tablet Take 15 mg by mouth 2 (two) times daily as needed for pain. 08/29/20 09/28/20 Yes [provider]  SSD 1 % cream Apply 1 application topically daily. 08/15/20  Yes [provider]  traZODone (DESYREL) 50 MG tablet Take 150 mg by mouth at bedtime.  04/17/20  Yes [provider]  NARCAN 4 MG/0.1ML LIQD nasal spray kit Place 1 spray into the nose once. 08/29/20   [provider]  NON FORMULARY by Intrathecal Infusion route continuous. MORPHINE / BUPIVACAINE INTRATHECAL PUMP     [provider]  polyethylene glycol (MIRALAX / GLYCOLAX) 17 g packet Take 17 g by mouth daily as needed (constipation).    [provider]  potassium chloride (KLOR-CON) 10 MEQ tablet Take 2 tablets (20 mEq total) by mouth daily. Patient not taking: Reported on 08/19/2020 05/19/20   Fritzi Mandes, MD  torsemide (DEMADEX) 20 MG tablet Take 1 tablet (20 mg total) by mouth daily. Patient not taking: Reported on 08/30/2020 05/20/20   Fritzi Mandes, MD    Allergies Penicillins, Erythromycin, Levofloxacin, Lidocaine, Clindamycin, Levothyroxine, Tiotropium, Doxycycline, and Sulfa antibiotics  Family History  Problem Relation Age of Onset  . Hypertension Mother   . Liver cancer Father     Social History Social History   Tobacco Use  . Smoking status: Former Smoker    Packs/day: 1.00    Years: 30.00    Pack years: 30.00    Types: Cigarettes    Quit date: 06/09/1999    Years  since quitting: 21.2  . Smokeless tobacco: Never Used  Vaping Use  . Vaping Use: Never used  Substance Use Topics  . Alcohol use: No  . Drug use: Never    Review of Systems  Review of Systems  Unable to perform ROS: Mental status change  Constitutional: Positive for fatigue.  Respiratory: Positive for shortness of breath.   Gastrointestinal: Positive for nausea.  Musculoskeletal: Positive for arthralgias and gait problem.  Neurological: Positive for weakness.     ____________________________________________  PHYSICAL EXAM:      VITAL SIGNS: ED Triage Vitals  Enc Vitals Group     BP 08/21/2020 1616 (!) 57/48     Pulse Rate 08/12/2020 1623 84     Resp 08/09/2020 1616 (!) 21     Temp --      Temp src --      SpO2 09/06/2020 1623 (!) 80 %     Weight 08/20/2020 1604 221 lb 9 oz (100.5  kg)     Height 08/29/2020 1604 5' 5" (1.651 m)     Head Circumference --      Peak Flow --      Pain Score 08/15/2020 1604 7     Pain Loc --      Pain Edu? --      Excl. in [place]? --      Physical Exam Vitals and nursing note reviewed.  Constitutional:      General: She is in acute distress.     Appearance: She is well-developed. She is toxic-appearing.  HENT:     Head: Normocephalic and atraumatic.  Eyes:     Conjunctiva/sclera: Conjunctivae normal.  Cardiovascular:     Rate and Rhythm: Normal rate and regular rhythm.     Heart sounds: Normal heart sounds.  Pulmonary:     Effort: Pulmonary effort is normal. Bradypnea present. No respiratory distress.     Breath sounds: Decreased breath sounds present. No wheezing.  Abdominal:     General: There is no distension.  Musculoskeletal:     Cervical back: Neck supple.     Right lower leg: No edema.     Left lower leg: Tenderness (Diffuse TTP over left hip, thigh, with associated swelling. LLE shortened and externally rotated.) present. No edema.  Skin:    General: Skin is warm.     Capillary Refill: Capillary refill takes less than 2 seconds.      Findings: No rash.  Neurological:     Mental Status: She is alert and oriented to person, place, and time.     Motor: No abnormal muscle tone.       ____________________________________________   LABS (all labs ordered are listed, but only abnormal results are displayed)  Labs Reviewed  COMPREHENSIVE METABOLIC PANEL - Abnormal; Notable for the following components:      Result Value   Sodium 134 (*)    Potassium 5.9 (*)    CO2 21 (*)    Glucose, Bld 153 (*)    BUN 59 (*)    Creatinine, Ser 2.40 (*)    Calcium 8.4 (*)    AST 10 (*)    GFR, Estimated 22 (*)    All other components within normal limits  CBC WITH DIFFERENTIAL/PLATELET - Abnormal; Notable for the following components:   WBC 12.4 (*)    RDW 16.1 (*)    Neutro Abs 10.9 (*)    All other components within normal limits  URINALYSIS, COMPLETE (UACMP) WITH MICROSCOPIC - Abnormal; Notable for the following components:   Color, Urine COLORLESS (*)    APPearance TURBID (*)    Hgb urine dipstick SMALL (*)    Protein, ur 100 (*)    Leukocytes,Ua SMALL (*)    All other components within normal limits  BLOOD GAS, VENOUS - Abnormal; Notable for the following components:   pH, Ven 7.11 (*)    pCO2, Ven 61 (*)    pO2, Ven <31.0 (*)    Bicarbonate 19.4 (*)    Acid-base deficit 10.6 (*)    All other components within normal limits  URINALYSIS, MICROSCOPIC (REFLEX) - Abnormal; Notable for the following components:   Bacteria, UA MANY (*)    All other components within normal limits  CULTURE, BLOOD (ROUTINE X 2)  CULTURE, BLOOD (ROUTINE X 2)  URINE CULTURE  LACTIC ACID, PLASMA  PROTIME-INR  APTT  TSH  PROCALCITONIN  LACTIC ACID, PLASMA  CORTISOL  HIV ANTIBODY (ROUTINE TESTING W REFLEX)  CBC  BASIC METABOLIC PANEL  BLOOD GAS, ARTERIAL  MAGNESIUM  PHOSPHORUS  SAMPLE TO BLOOD BANK    ____________________________________________  EKG:  ________________________________________  RADIOLOGY All imaging,  including plain films, CT scans, and ultrasounds, independently reviewed by me, and interpretations confirmed via formal radiology reads.  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 Advanced degenerative change left hip. Negative right hip Lumbar fusion hardware appears unchanged. Long segment left iliac vein stent unchanged. Foley catheter in the bladder. IMPRESSION: Negative for fracture. Electronically Signed   By: Franchot Gallo M.D.   On: 08/11/2020 17:41   DG Chest Port 1 View  Result Date: 08/14/2020 CLINICAL DATA:  Question sepsis EXAM: PORTABLE CHEST 1 VIEW COMPARISON:  05/14/2020 FINDINGS: Cardiac enlargement with pulmonary artery enlargement. Negative for heart failure edema or effusion. Hypoventilation with decreased lung volume. Negative for pneumonia. IMPRESSION: No active disease. Electronically Signed   By: Franchot Gallo M.D.   On: 08/10/2020 17:40   DG FEMUR MIN 2 VIEWS LEFT  Result Date: 08/18/2020 CLINICAL DATA:  Lethargy necrotic hip EXAM: LEFT FEMUR 2 VIEWS COMPARISON:  05/14/2020 FINDINGS: Catheter over the low pelvis. Vascular stent in the left groin. No acute displaced fracture. Sclerosis and lucency within the subarticular left femoral head with mild deformity suggestive of AVN. Advanced degenerative changes. 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 Advanced degenerative changes of the left hip. Electronically Signed   By: Donavan Foil M.D.   On: 08/25/2020 17:24    ____________________________________________  PROCEDURES   Procedure(s) performed (including Critical Care):  .Critical Care Performed by: Duffy Bruce, MD Authorized by:  Duffy Bruce, MD   Critical care provider statement:    Critical care time (minutes):  55   Critical care time was exclusive of:  Separately billable procedures and treating other patients and teaching time   Critical care was necessary to treat or prevent imminent or life-threatening deterioration of the following conditions:  Respiratory failure, circulatory failure and cardiac failure   Critical care was time spent personally by me on the following activities:  Development of treatment plan with patient or surrogate, discussions with consultants, evaluation of patient's response to treatment, examination of patient, obtaining history from patient or surrogate, ordering and performing treatments and interventions, ordering and review of laboratory studies, ordering and review of radiographic studies, pulse oximetry, re-evaluation of patient's condition and review of old charts   I assumed direction of critical care for this patient from another provider in my specialty: no      ____________________________________________  INITIAL IMPRESSION / MDM / [place] / ED COURSE  As part of my medical decision making, I reviewed the following data within the [place] notes reviewed and incorporated, Old chart reviewed, Notes from prior ED visits, and [place] Controlled Substance Database       *Regina White was evaluated in Emergency Department on 08/13/2020 for the symptoms described in the history of present illness. She was evaluated in the context of the global COVID-19 pandemic, which necessitated consideration that the patient might be at risk for infection with the SARS-CoV-2 virus that causes COVID-19. Institutional protocols and algorithms that pertain to the evaluation  of patients at risk for COVID-19 are in a state of rapid change based on information released by regulatory bodies including the CDC and federal and state organizations. These policies  and algorithms were followed during the patient's care in the ED.  Some ED evaluations and interventions may be delayed as a result of limited staffing during the pandemic.*     Medical Decision Making:  64 yo F here with altered mental status. On arrival, pt hypoxic to 30s with good waveform, hypotensive, altered. BVM performed and 2L IVF started, with improvement in mentation though she remained hypotensive. Sepsis protocol initiated with broad-spectrum ABX. Suspect accidental morphine OD in setting of opiate dependence, recent restarting at a hgih dose, and likely diminished clearance from renal dysfunction. Labs shows significant AKI. LA normal which is reassuring in setting of her hypotension. VBG shows acute on chronic hypercapnia but given her improvement mentation s/p narcan and O2, do not feel intubation is indicated at this time. Will admit to ICU. Peripheral levo running. Pt noted to have occasional PVCs on tele, but otherwise NSR.  ____________________________________________  FINAL CLINICAL IMPRESSION(S) / ED DIAGNOSES  Final diagnoses:  Acute renal failure, unspecified acute renal failure type ([place])  Acute respiratory failure with hypoxia ([place])  Opiate overdose, accidental or unintentional, initial encounter ([place])     MEDICATIONS GIVEN DURING THIS VISIT:  Medications  norepinephrine (LEVOPHED) 58m in 2519mpremix infusion (7 mcg/min Intravenous Rate/Dose Change 08/28/2020 1812)  vancomycin (VANCOCIN) IVPB 1000 mg/200 mL premix (has no administration in time range)  docusate sodium (COLACE) capsule 100 mg (has no administration in time range)  polyethylene glycol (MIRALAX / GLYCOLAX) packet 17 g (has no administration in time range)  heparin injection 5,000 Units (has no administration in time range)  ondansetron (ZOFRAN) injection 4 mg (has no administration in time range)  pantoprazole (PROTONIX) injection 40 mg (has no administration in time range)  0.9 %  sodium chloride  infusion (250 mLs Intravenous New Bag/Given 09/05/2020 1615)  sodium bicarbonate 150 mEq in sterile water 1,000 mL infusion (has no administration in time range)  naloxone (NFocus Hand Surgicenter LLCinjection 2 mg (2 mg Intravenous Given 08/29/2020 1728)  lactated ringers bolus 1,000 mL (0 mLs Intravenous Stopped 08/16/2020 1728)    And  lactated ringers bolus 800 mL (0 mLs Intravenous Stopped 08/19/2020 1808)  metroNIDAZOLE (FLAGYL) IVPB 500 mg (500 mg Intravenous New Bag/Given 08/22/2020 1812)  vancomycin (VANCOCIN) IVPB 1000 mg/200 mL premix (0 mg Intravenous Stopped 08/21/2020 1813)  sodium chloride 0.9 % bolus 1,000 mL (0 mLs Intravenous Stopped 08/15/2020 1855)  ceFEPIme (MAXIPIME) 2 g in sodium chloride 0.9 % 100 mL IVPB (0 g Intravenous Stopped 08/09/2020 1723)  lactated ringers bolus 1,000 mL (0 mLs Intravenous Stopped 09/04/2020 1656)  naloxone (NARCAN) injection 2 mg (2 mg Intravenous Given 08/17/2020 1725)  calcium gluconate 1 g/ 50 mL sodium chloride IVPB (1,000 mg Intravenous New Bag/Given 08/21/2020 1802)  insulin aspart (novoLOG) injection 5 Units (5 Units Intravenous Given 08/16/2020 1807)  dextrose 50 % solution 25 mL (25 mLs Intravenous Given 08/15/2020 1803)  ipratropium-albuterol (DUONEB) 0.5-2.5 (3) MG/3ML nebulizer solution 3 mL (3 mLs Nebulization Given 08/21/2020 1815)  0.9 %  sodium chloride infusion ( Intravenous New Bag/Given 08/11/2020 1815)     ED Discharge Orders    None       Note:  This document was prepared using Dragon voice recognition software and may include unintentional dictation errors.   IsDuffy BruceMD 09/05/2020 19[place]

## 2020-09-01 NOTE — Progress Notes (Signed)
CODE SEPSIS - PHARMACY COMMUNICATION  **Broad Spectrum Antibiotics should be administered within 1 hour of Sepsis diagnosis**  Time Code Sepsis Called/Page Received: @ 0430  Antibiotics Ordered: Cefepime and Vancomycin   Time of 1st antibiotic administration: @ 1651   Additional action taken by pharmacy: Pharmacist review patient;s allergies and spoke to RN about Abx change.   If necessary, Name of Provider/Nurse Contacted: N/A  Gardner Candle, PharmD, BCPS Clinical Pharmacist 09-05-2020 4:55 PM

## 2020-09-01 NOTE — ED Notes (Signed)
Provider notified of BP.

## 2020-09-01 NOTE — Progress Notes (Signed)
PCCM INTERVAL PROGRESS NOTE   I have had an in-depth discussion with patient and husband. We reviewed her recent medical course which is significant for chronic pain, bedridden, CHF, COVID 19 admission in 10/2019, and recent admissions for sepsis. They admit her quality of life is significantly less than she would prefer it to be. Now her course is complicated by respiratory failure and renal failure. She has been on the brink of requiring intubation and pressors here in the ED, but has been on BiPAP and is now more awake and able to participate in goals of care discussion.   She is clear she would NOT want aggressive measures including intubation and TLC/pressors. We discussed the meaning of DNR and that this seems most in line with her wishes. She agrees. There was some discussion of transfer to [facility], but given new GOC decisions she wishes to stay here.   She still hopes to recover from this and is not prepared for comfort care. Continue conservative treatment.  She would like to be off BiPAP as it is uncomfortable. She understands that without BiPAP she may become hypoxemic or hypercarbic. If she can not remain stable without BiPAP she will need to go back on or transition to comfort care. She voices understanding.    Joneen Roach, AGACNP-BC [place] [facility]  See Amion for personal pager PCCM on call pager [phone]  09/16/2020 8:56 PM

## 2020-09-01 NOTE — ED Notes (Signed)
Family at bedside MD updating family

## 2020-09-01 NOTE — ED Notes (Signed)
2 mg narcan given by Herbert Seta RN per verbal order from EDP Isaacs

## 2020-09-01 NOTE — ED Notes (Signed)
Blood cultures drawn at this time.

## 2020-09-01 NOTE — ED Notes (Signed)
1L LR and 1L NS bolus given at this time

## 2020-09-01 NOTE — H&P (Signed)
Regina White is an 64 y.o. female.   Chief Complaint: Altered mental status HPI:  Regina White is a 64 year old former smoker with a very complicated past medical history of chronic back and leg pain in the setting of prior lumbar surgery and avascular necrosis of the left hip.  At baseline she is bedridden.  She is followed by the [facility] chronic pain clinic.  She had an intrathecal pump for morphine administration that was inactivated at the pain clinic on 22 October.  She was given a prescription for MSIR 15 mg twice daily in the hopes of controlling her pain.  As noted at baseline she is bedbound and unable to sit up due to pain.  She is also on gabapentin and Cymbalta.  Of note she was given Narcan nasal inhaler during her last visit at the pain clinic on 22 October.  She took a dose of morphine p.o. and developed confusion and drowsiness.  EMS had to be activated.  EMS provided Narcan 1 mg without response.  The patient was noted to be hypoxic but had some initial improvement on oxygenation with supplemental oxygen.  The patient arrived to the emergency room minimally responsive and with nasal cannula in place.    Evaluation in the emergency room showed that the patient neck and appear toxic.  She was profoundly hypoxic with saturations in the 30s with good waveform.  She was hypotensive and with altered mental status.  She responded to IV fluid resuscitation, BiPAP therapy and Narcan therapy which was repeated.  She has peripheral IV norepinephrine.  Laboratory data revealed acute renal failure with BUN of 59 and creatinine of 2.40, potassium was 5.9 GFR estimated at 22.  White count was 12.4.  Venous blood gas showed a pH of 7.11.  Chest x-ray was clear with the exception of chronic changes she has significant cardiomegaly.  Patient's husband is at bedside currently wishes to have patient be full code.  Patient is confused but cannot say hello and how are you.  Does answer yes or no but does not  elaborate.  Somewhat lethargic.  Past Medical History:  Diagnosis Date  . Asthma   . CHF (congestive heart failure) ([place])   . Chronic back pain   . COPD (chronic obstructive pulmonary disease) ([place])   . Pulmonary hypertension [facility])    Patient Active Problem List   Diagnosis Date Noted  . Acute on chronic systolic CHF (congestive heart failure), NYHA class 3 ([place])   . Left hip pain   . CHF (congestive heart failure) ([place])   . SOB (shortness of breath)   . Acute on chronic respiratory failure with hypoxia ([place]) 05/12/2020  . Hypotension   . Acute on chronic systolic congestive heart failure ([place])   . Goals of care, counseling/discussion   . Palliative care by specialist   . Sepsis secondary to UTI ([place]) 04/24/2020  . Sepsis ([place]) 04/24/2020  . Pressure injury of skin 10/23/2019  . COVID-19 virus infection 10/19/2019  . Hyponatremia 10/19/2019  . Hypovolemic shock ([place]) 07/22/2019  . Acute respiratory failure with hypoxemia ([place]) 12/04/2018  . Acute respiratory failure with hypoxia ([place]) 06/02/2018  . Diarrhea of presumed infectious origin 02/17/2016  . Right acute serous otitis media 02/17/2016  . COPD (chronic obstructive pulmonary disease) ([place]) 12/23/2015  . Heart failure with preserved ejection fraction ([place]) 12/23/2015  . DDD (degenerative disc disease) 09/25/2013  . Narrowing of intervertebral disc space 09/25/2013  . OSA (obstructive sleep apnea) 07/25/2013  .  Deep vein thrombosis (DVT) ([place]) 06/25/2013  . Erythema 06/18/2013  . Left leg pain 06/18/2013  . Mediastinal lymphadenopathy 06/18/2013  . DVT, lower extremity ([place]) 06/14/2013  . Pulmonary hypertension ([place]) 06/14/2013  . Asthma 09/21/2010  . Chronic pain 09/21/2010  . Hypertension, benign 09/21/2010  . Hypothyroidism 09/21/2010  . Venous stasis 09/21/2010    Past Surgical History:  Procedure Laterality Date  . BACK SURGERY      Family History  Problem Relation Age of Onset  . Hypertension Mother    . Liver cancer Father    Social History   Tobacco Use  . Smoking status: Former Smoker    Packs/day: 1.00    Years: 30.00    Pack years: 30.00    Types: Cigarettes    Quit date: 06/09/1999    Years since quitting: 21.2  . Smokeless tobacco: Never Used  Substance Use Topics  . Alcohol use: No     Allergies:  Allergies  Allergen Reactions  . Penicillins Anaphylaxis    Breathing problems Did it involve swelling of the face/tongue/throat, SOB, or low BP? Yes Did it involve sudden or severe rash/hives, skin peeling, or any reaction on the inside of your mouth or nose? Unknown Did you need to seek medical attention at a hospital or doctor's office? Unknown When did it last happen?unknown If all above answers are "NO", may proceed with cephalosporin use. Patient tolerated Ceftriaxone September 2020  . Erythromycin Nausea And Vomiting and Rash    STOMACH PAIN  . Levofloxacin Other (See Comments)    Muscle aches  . Lidocaine Rash  . Clindamycin Other (See Comments)    Recurrent C diff  . Levothyroxine Other (See Comments)    Vaginal bleeding Reaction to generic (pt currently takes name brand Synthroid)  . Tiotropium Other (See Comments)    Coughing up blood  . Doxycycline Nausea And Vomiting  . Sulfa Antibiotics Rash   Current Outpatient Medications  Medication Instructions  . acidophilus (RISAQUAD) CAPS capsule 1 capsule, Oral, Daily  . albuterol (VENTOLIN HFA) 108 (90 Base) MCG/ACT inhaler 2 puffs, Inhalation, Every 6 hours PRN  . apixaban (ELIQUIS) 2.5 mg, Oral, 2 times daily  . ATROVENT HFA 17 MCG/ACT inhaler 2 puffs, Inhalation, 2 times daily  . bisoprolol (ZEBETA) 5 mg, Oral, Daily  . DULoxetine (CYMBALTA) 20 mg, Oral, 2 times daily  . ENTRESTO 24-26 MG 1 tablet, Oral, 2 times daily  . Fluticasone-Salmeterol (ADVAIR) 500-50 MCG/DOSE AEPB 1 puff, Inhalation, 2 times daily  . levothyroxine (SYNTHROID) 200 mcg, Oral, Daily  . losartan (COZAAR) 25 mg, Oral, Every  evening  . montelukast (SINGULAIR) 10 mg, Oral, Daily at bedtime  . morphine (MSIR) 15 mg, Oral, 2 times daily PRN  . NARCAN 4 MG/0.1ML LIQD nasal spray kit 1 spray, Nasal,  Once  . NON FORMULARY Intrathecal Infusion, Continuous, MORPHINE / BUPIVACAINE INTRATHECAL PUMP  . polyethylene glycol (MIRALAX / GLYCOLAX) 17 g, Oral, Daily PRN  . potassium chloride (KLOR-CON) 10 MEQ tablet 20 mEq, Oral, Daily  . SSD 1 % cream 1 application, Topical, Daily  . torsemide (DEMADEX) 20 mg, Oral, Daily  . traZODone (DESYREL) 150 mg, Oral, Daily at bedtime  . Vitamin D 2,000 Units, Oral, Daily      Results for orders placed or performed during the hospital encounter of 08/31/2020 (from the past 48 hour(s))  Lactic acid, plasma     Status: None   Collection Time: 08/23/2020  4:14 PM  Result Value Ref Range  Lactic Acid, Venous 1.8 0.5 - 1.9 mmol/L    Comment: Performed at [facility], [address] [place], [place] [address]  Comprehensive metabolic panel     Status: Abnormal   Collection Time: 08/23/2020  4:14 PM  Result Value Ref Range   Sodium 134 (L) 135 - 145 mmol/L   Potassium 5.9 (H) 3.5 - 5.1 mmol/L   Chloride 105 98 - 111 mmol/L   CO2 21 (L) 22 - 32 mmol/L   Glucose, Bld 153 (H) 70 - 99 mg/dL    Comment: Glucose reference range applies only to samples taken after fasting for at least 8 hours.   BUN 59 (H) 8 - 23 mg/dL   Creatinine, Ser 2.40 (H) 0.44 - 1.00 mg/dL   Calcium 8.4 (L) 8.9 - 10.3 mg/dL   Total Protein 6.5 6.5 - 8.1 g/dL   Albumin 3.6 3.5 - 5.0 g/dL   AST 10 (L) 15 - 41 U/L   ALT 9 0 - 44 U/L   Alkaline Phosphatase 55 38 - 126 U/L   Total Bilirubin 0.6 0.3 - 1.2 mg/dL   GFR, Estimated 22 (L) >60 mL/min    Comment: (NOTE) Calculated using the CKD-EPI Creatinine Equation (2021)    Anion gap 8 5 - 15    Comment: Performed at [facility], [address] [place], [place] [address]  CBC WITH DIFFERENTIAL     Status: Abnormal   Collection Time: 09/05/2020   4:14 PM  Result Value Ref Range   WBC 12.4 (H) 4.0 - 10.5 K/uL   RBC 4.91 3.87 - 5.11 MIL/uL   Hemoglobin 14.0 12.0 - 15.0 g/dL   HCT 45.5 36 - 46 %   MCV 92.7 80.0 - 100.0 fL   MCH 28.5 26.0 - 34.0 pg   MCHC 30.8 30.0 - 36.0 g/dL   RDW 16.1 (H) 11.5 - 15.5 %   Platelets 220 150 - 400 K/uL   nRBC 0.0 0.0 - 0.2 %   Neutrophils Relative % 88 %   Neutro Abs 10.9 (H) 1.7 - 7.7 K/uL   Lymphocytes Relative 7 %   Lymphs Abs 0.9 0.7 - 4.0 K/uL   Monocytes Relative 4 %   Monocytes Absolute 0.5 0.1 - 1.0 K/uL   Eosinophils Relative 0 %   Eosinophils Absolute 0.0 0.0 - 0.5 K/uL   Basophils Relative 1 %   Basophils Absolute 0.1 0.0 - 0.1 K/uL   Immature Granulocytes 0 %   Abs Immature Granulocytes 0.05 0.00 - 0.07 K/uL    Comment: Performed at [facility], [address] [place], [place] [address]  Protime-INR     Status: None   Collection Time: 08/27/2020  4:14 PM  Result Value Ref Range   Prothrombin Time 13.8 11.4 - 15.2 seconds   INR 1.1 0.8 - 1.2    Comment: (NOTE) INR goal varies based on device and disease states. Performed at [facility], [address] [place], [place] [address]   APTT     Status: None   Collection Time: 08/23/2020  4:14 PM  Result Value Ref Range   aPTT 31 24 - 36 seconds    Comment: Performed at [facility], [address] [place], [place] [address]  TSH     Status: None   Collection Time: 09/06/2020  4:14 PM  Result Value Ref Range   TSH 0.672 0.350 - 4.500 uIU/mL    Comment: Performed by a 3rd Generation assay with  a functional sensitivity of <=0.01 uIU/mL. Performed at [facility], [address] [place], [place] [address]   Urinalysis, Complete w Microscopic     Status: Abnormal   Collection Time: 08/29/2020  4:24 PM  Result Value Ref Range   Color, Urine COLORLESS (A) YELLOW   APPearance TURBID (A) CLEAR   Specific Gravity, Urine 1.020 1.005 - 1.030   pH 5.0 5.0 - 8.0   Glucose, UA NEGATIVE NEGATIVE  mg/dL   Hgb urine dipstick SMALL (A) NEGATIVE   Bilirubin Urine NEGATIVE NEGATIVE   Ketones, ur NEGATIVE NEGATIVE mg/dL   Protein, ur 100 (A) NEGATIVE mg/dL   Nitrite NEGATIVE NEGATIVE   Leukocytes,Ua SMALL (A) NEGATIVE    Comment: Performed at [facility], [address] [place], [place] [address]  Urinalysis, Microscopic (reflex)     Status: Abnormal   Collection Time: 08/12/2020  4:24 PM  Result Value Ref Range   RBC / HPF >50 0 - 5 RBC/hpf   WBC, UA >50 0 - 5 WBC/hpf   Bacteria, UA MANY (A) NONE SEEN   Squamous Epithelial / LPF NONE SEEN 0 - 5   WBC Clumps PRESENT    Mucus PRESENT     Comment: Performed at [facility], [address] [place], [place] [address]  Blood gas, venous     Status: Abnormal   Collection Time: 08/25/2020  4:45 PM  Result Value Ref Range   pH, Ven 7.11 (LL) 7.25 - 7.43    Comment: CRITICAL RESULT CALLED TO, READ BACK BY AND VERIFIED WITH: ISAACS C MD AT 1658 08/14/2020 BY S DAVID RRT    pCO2, Ven 61 (H) 44 - 60 mmHg   pO2, Ven <31.0 (LL) 32 - 45 mmHg   Bicarbonate 19.4 (L) 20.0 - 28.0 mmol/L   Acid-base deficit 10.6 (H) 0.0 - 2.0 mmol/L   O2 Saturation 19.9 %   Patient temperature 37.0    Collection site LINE    Sample type VENOUS     Comment: Performed at [facility], [address], [place], [place] [address]   DG Pelvis 1-2 Views  Result Date: 08/22/2020 CLINICAL DATA:  Fall EXAM: PELVIS - 1-2 VIEW COMPARISON:  05/14/2020 FINDINGS: Negative for pelvic fracture. Advanced degenerative change left hip. Negative right hip Lumbar fusion hardware appears unchanged. Long segment left iliac vein stent unchanged. Foley catheter in the bladder. IMPRESSION: Negative for fracture. Electronically Signed   By: Franchot Gallo M.D.   On: 08/29/2020 17:41   DG Chest Port 1 View  Result Date: 08/14/2020 CLINICAL DATA:  Question sepsis EXAM: PORTABLE CHEST 1 VIEW COMPARISON:  05/14/2020 FINDINGS: Cardiac enlargement with pulmonary  artery enlargement. Negative for heart failure edema or effusion. Hypoventilation with decreased lung volume. Negative for pneumonia. IMPRESSION: No active disease. Electronically Signed   By: Franchot Gallo M.D.   On: 08/09/2020 17:40   DG FEMUR MIN 2 VIEWS LEFT  Result Date: 08/28/2020 CLINICAL DATA:  Lethargy necrotic hip EXAM: LEFT FEMUR 2 VIEWS COMPARISON:  05/14/2020 FINDINGS: Catheter over the low pelvis. Vascular stent in the left groin. No acute displaced fracture. Sclerosis and lucency within the subarticular left femoral head with mild deformity suggestive of AVN. Advanced degenerative changes. 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 Advanced degenerative changes of the left hip. Electronically Signed   By: Donavan Foil M.D.   On: 09/07/2020 17:24    Review of Systems  Unable to  perform ROS: Acuity of condition   Blood pressure 117/74, pulse 81, temperature 98.8 F (37.1 C), resp. rate 16, height 5' 5"  (1.651 m), weight 100.5 kg, SpO2 (!) 83 %.   Currently on BiPAP adjustments made to the BiPAP.  Physical Exam  GENERAL: Chronically ill-appearing woman laying flat in bed, does answer with monosyllables.  Being assisted with BiPAP. HEAD: Normocephalic, atraumatic.  EYES: Pupils equal, round, reactive to light.  No scleral icterus.  MOUTH: BiPAP mask in place NECK: Supple. No thyromegaly. Trachea midline. No JVD.  No adenopathy. PULMONARY: Good air entry bilaterally.  Coarse with no other adventitious sounds. CARDIOVASCULAR: S1 and S2. Regular rate and rhythm.  ABDOMEN: Obese, soft, nondistended, normoactive bowel sounds. MUSCULOSKELETAL: Diffuse tenderness to palpation over the left hip and thigh there is swelling that appears to be chronic over this area.  Left lower extremity shortened and externally rotated.  No lower extremity edema otherwise except for as noted. NEUROLOGIC: Appears oriented, somewhat lethargic but  arousable.  Speaks in monosyllables. SKIN: Intact,warm,dry.  Cannot examine sacral areas due to inability to position patient properly.   Assessment/Plan  Altered mental status with acute encephalopathy Query accidental opiate overdose Acute chronic respiratory failure with hypoxia Supportive care Noninvasive ventilation for now Has received Narcan with no significant improvement Avoid narcotics and respiratory depressants Discussed with patient's husband currently wants full code  Acute renal failure Metabolic acidosis IV fluids Avoid nephrotoxins Sodium bicarb  Shock, query septic IV fluid support Vasopressors as needed to maintain MAP 65 or better Reassess for need of central access  COPD without exacerbation Bronchodilators  ID Rule out potential urinary tract infection Trend procalcitonin Panculture Empiric vancomycin and cefepime   Patient will be admitted to SDU/ICU.  Prognosis overall is guarded due to multiple comorbidities and baseline bedbound status.  Discussed with patient's husband reassessment of goals of care.    Renold Don, MD [place] [facility]  08/28/2020, 6:44 PM    *This note was dictated using voice recognition software/Dragon.  Despite best efforts to proofread, errors can occur which can change the meaning.  Any change was purely unintentional.

## 2020-09-01 NOTE — ED Notes (Signed)
ICU provider notified that pt is at 56mcg/min of levophed. As per provider, pt is maxed out now and it will not be increased and a central line will not be placed.  Pt is sleeping, but when RN wakes pt up she is cooperative, pleasant and alert. As per provider, as long as pt is mentating, no changes will be made. Husband at bedside and RN informed him that if she becomes altered to let us know. Husband provided with recliner, pillow and blanket at this time.

## 2020-09-01 NOTE — Progress Notes (Signed)
PCCM INTERVAL PROGRESS NOTE   I came to bedside to assess patient. She remains hypotensive, hypoxemic, and somnolent despite oxygen and levophed . The patient declined central line and otherwise aggressive  earlier in the shift, so will keep levo dosing where it is considering her wishes. I offered BiPAP again, which was declined. I expressed to Mr Warrior my concerns she will not survive, but we are honoring her wishes to manage her care conservatively. Will continue to monitor.      Joneen Roach, AGACNP-BC  Pulmonary/Critical Care  See Amion for personal pager PCCM on call pager [phone]  09/03/2020 11:30 PM

## 2020-09-01 NOTE — ED Notes (Signed)
Pt transported to Xray. 

## 2020-09-01 NOTE — ED Notes (Addendum)
After talking with ICU provider. ICU provider stated pt will come off bipap and on high flow [place] at 15lpm. Respiratory therapist at bedside and has placed pt on 15lpm via [place] and on a non-rebreather at 15lpm. ICU provider notified. Pt is saturating at 76%. ICU provider notified.

## 2020-09-01 NOTE — ED Notes (Signed)
RT at bedside.

## 2020-09-01 NOTE — ED Notes (Signed)
EKG given to ICU provider, ICU provider at bedside.

## 2020-09-01 NOTE — ED Notes (Signed)
Pt responding to being bagged at this time. Pt more responsive to MD at this time. MD placed pt on NRB at this time.

## 2020-09-01 NOTE — ED Notes (Signed)
ICU provider notified of the oxygen saturation. As per ICU provider, that as long as work of breathing and mentation stay the same, no intervention will be needed at this time, as it may not be a true oxygen saturation, as per provider

## 2020-09-01 NOTE — ED Notes (Signed)
ICU provider at bedside. ICU provider informed nurse at this time we are going to stay the current course.

## 2020-09-02 DIAGNOSIS — J9601 Acute respiratory failure with hypoxia: Secondary | ICD-10-CM | POA: Diagnosis not present

## 2020-09-02 DIAGNOSIS — R6521 Severe sepsis with septic shock: Secondary | ICD-10-CM

## 2020-09-02 DIAGNOSIS — A419 Sepsis, unspecified organism: Secondary | ICD-10-CM

## 2020-09-02 LAB — PHOSPHORUS: Phosphorus: 7.2 mg/dL — ABNORMAL HIGH (ref 2.5–4.6)

## 2020-09-02 LAB — CBC
HCT: 53.1 % — ABNORMAL HIGH (ref 36.0–46.0)
Hemoglobin: 16.1 g/dL — ABNORMAL HIGH (ref 12.0–15.0)
MCH: 28.9 pg (ref 26.0–34.0)
MCHC: 30.3 g/dL (ref 30.0–36.0)
MCV: 95.2 fL (ref 80.0–100.0)
Platelets: 244 10*3/uL (ref 150–400)
RBC: 5.58 MIL/uL — ABNORMAL HIGH (ref 3.87–5.11)
RDW: 16.5 % — ABNORMAL HIGH (ref 11.5–15.5)
WBC: 15.5 10*3/uL — ABNORMAL HIGH (ref 4.0–10.5)
nRBC: 0.3 % — ABNORMAL HIGH (ref 0.0–0.2)

## 2020-09-02 LAB — GLUCOSE, CAPILLARY: Glucose-Capillary: 110 mg/dL — ABNORMAL HIGH (ref 70–99)

## 2020-09-02 LAB — BASIC METABOLIC PANEL
Anion gap: 12 (ref 5–15)
BUN: 54 mg/dL — ABNORMAL HIGH (ref 8–23)
CO2: 18 mmol/L — ABNORMAL LOW (ref 22–32)
Calcium: 8.9 mg/dL (ref 8.9–10.3)
Chloride: 105 mmol/L (ref 98–111)
Creatinine, Ser: 2.1 mg/dL — ABNORMAL HIGH (ref 0.44–1.00)
GFR, Estimated: 26 mL/min — ABNORMAL LOW (ref >60)
Glucose, Bld: 140 mg/dL — ABNORMAL HIGH (ref 70–99)
Potassium: 5.9 mmol/L — ABNORMAL HIGH (ref 3.5–5.1)
Sodium: 135 mmol/L (ref 135–145)

## 2020-09-02 LAB — MAGNESIUM: Magnesium: 2.2 mg/dL (ref 1.7–2.4)

## 2020-09-02 LAB — MRSA PCR SCREENING: MRSA by PCR: NEGATIVE

## 2020-09-02 LAB — HIV ANTIBODY (ROUTINE TESTING W REFLEX): HIV Screen 4th Generation wRfx: NONREACTIVE

## 2020-09-02 MED ORDER — INSULIN ASPART 100 UNIT/ML IV SOLN
5.0000 [IU] | Freq: Once | INTRAVENOUS | Status: AC
  Administered 2020-09-02: 5 [IU] via INTRAVENOUS
  Filled 2020-09-02: qty 0.05

## 2020-09-02 MED ORDER — CHLORHEXIDINE GLUCONATE CLOTH 2 % EX PADS
6.0000 | MEDICATED_PAD | Freq: Every day | CUTANEOUS | Status: DC
  Administered 2020-09-02: 6 via TOPICAL

## 2020-09-02 MED ORDER — SODIUM BICARBONATE 8.4 % IV SOLN
100.0000 meq | Freq: Once | INTRAVENOUS | Status: AC
  Administered 2020-09-02: 100 meq via INTRAVENOUS
  Filled 2020-09-02: qty 50

## 2020-09-02 MED ORDER — DEXTROSE 50 % IV SOLN
25.0000 mL | Freq: Once | INTRAVENOUS | Status: AC
  Administered 2020-09-02: 25 mL via INTRAVENOUS
  Filled 2020-09-02: qty 50

## 2020-09-02 MED ORDER — ACETAMINOPHEN 650 MG RE SUPP
650.0000 mg | Freq: Four times a day (QID) | RECTAL | Status: DC | PRN
  Administered 2020-09-02: 650 mg via RECTAL
  Filled 2020-09-02: qty 1

## 2020-09-04 LAB — URINE CULTURE
Culture: >=100000 — AB
Special Requests: NORMAL

## 2020-09-06 LAB — CULTURE, BLOOD (ROUTINE X 2)
Culture: NO GROWTH
Culture: NO GROWTH
Special Requests: ADEQUATE
Special Requests: ADEQUATE

## 2020-09-08 NOTE — ED Notes (Addendum)
Patient lying in bed and appears to be asleep, NAD observed and will continue to monitor Patient husband is at bedside

## 2020-09-08 NOTE — Progress Notes (Signed)
Dr. Earlie Server has spoken with pt's husband. He wants no escalation of care. He will be up to unit in 15 minutes he says.

## 2020-09-08 NOTE — Death Summary Note (Signed)
DEATH SUMMARY   Patient Details  Name: Regina [name] MRN: [id number] DOB: 1956/06/19  Admission/Discharge Information   Admit Date:  07-Sep-2020  Date of Death:  September 08, 2020  Time of Death:  2021/03/01  Length of Stay: 1  Referring Physician: Cain Sieve, MD   Reason(s) for Hospitalization  Septic Shock UTI Acute on Chronic Hypoxic Respiratory Failure Acute Renal Failure Metabolic Acidosis Acute Metabolic Encephalopathy  Diagnoses  Preliminary cause of death:   Septic Shock Secondary Diagnoses (including complications and co-morbidities):  Active Problems:   Acute respiratory failure with hypoxia (HCC) Acute on Chronic Hypoxic Respiratory Failure UTI Acute Renal Failure Metabolic Acidosis Acute Metabolic Encephalopathy  Brief Hospital Course (including significant findings, care, treatment, and services provided and events leading to death)   Regina [name] is a 64 year old former smoker with a very complicated past medical history of chronic back and leg pain in the setting of prior lumbar surgery and avascular necrosis of the left hip.  At baseline she is bedridden.  She is followed by the [facility] chronic pain clinic.  She had an intrathecal pump for morphine administration that was inactivated at the pain clinic on 22 October.  She was given a prescription for MSIR 15 mg twice daily in the hopes of controlling her pain.  As noted at baseline she is bedbound and unable to sit up due to pain.  She is also on gabapentin and Cymbalta.  Of note she was given Narcan nasal inhaler during her last visit at the pain clinic on 22 October.  She took a dose of morphine p.o. and developed confusion and drowsiness.  EMS had to be activated.  EMS provided Narcan 1 mg without response.  The patient was noted to be hypoxic but had some initial improvement on oxygenation with supplemental oxygen.  The patient arrived to the emergency room minimally responsive and with nasal cannula in  place.    Evaluation in the emergency room showed that the patient neck and appear toxic.  She was profoundly hypoxic with saturations in the 30s with good waveform.  She was hypotensive and with altered mental status.  She responded to IV fluid resuscitation, BiPAP therapy and Narcan therapy which was repeated.  She has peripheral IV norepinephrine.  Laboratory data revealed acute renal failure with BUN of 59 and creatinine of 2.40, potassium was 5.9 GFR estimated at 22.  [name] count was 12.4.  Venous blood gas showed a pH of 7.11.  Chest x-ray was clear with the exception of chronic changes she has significant cardiomegaly. Urinalysis is consistent with UTI.   PCCM was asked to admit the pt to ICU for further workup and treatment of Septic Shock, Acute on Chronic Hypoxic Respiratory Failure, and AKI.  Later in the evening on Sep 07, 2020 she remained hypotensive and  hypoxemic despite BiPAP and Levophed at 10 mcg.  Pt elected to make herself DNR/DNI, refused central line placement and requested that no escalation of care be implemented.  On 2020-09-08 she continued to decline, and in respecting her previous wishes, no escalation of care was pursued.  She ultimately expired on 2020-09-08.     Pertinent Labs and Studies  Significant Diagnostic Studies DG Pelvis 1-2 Views  Result Date: 07-Sep-2020 CLINICAL DATA:  Fall EXAM: PELVIS - 1-2 VIEW COMPARISON:  05/14/2020 FINDINGS: Negative for pelvic fracture. Advanced degenerative change left hip. Negative right hip Lumbar fusion hardware appears unchanged. Long segment left iliac vein stent unchanged. Foley catheter in the bladder. IMPRESSION: Negative for fracture. Electronically  Signed   By: Marlan Palau M.D.   On: 08/31/2020 17:41   DG Chest Port 1 View  Result Date: 08/26/2020 CLINICAL DATA:  Question sepsis EXAM: PORTABLE CHEST 1 VIEW COMPARISON:  05/14/2020 FINDINGS: Cardiac enlargement with pulmonary artery enlargement. Negative for heart failure  edema or effusion. Hypoventilation with decreased lung volume. Negative for pneumonia. IMPRESSION: No active disease. Electronically Signed   By: Marlan Palau M.D.   On: 08/15/2020 17:40   DG FEMUR MIN 2 VIEWS LEFT  Result Date: 08/19/2020 CLINICAL DATA:  Lethargy necrotic hip EXAM: LEFT FEMUR 2 VIEWS COMPARISON:  05/14/2020 FINDINGS: Catheter over the low pelvis. Vascular stent in the left groin. No acute displaced fracture. Sclerosis and lucency within the subarticular left femoral head with mild deformity suggestive of AVN. Advanced degenerative changes. 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 Advanced degenerative changes of the left hip. Electronically Signed   By: Jasmine Pang M.D.   On: 08/15/2020 17:24    Microbiology Recent Results (from the past 240 hour(s))  Blood Culture (routine x 2)     Status: None (Preliminary result)   Collection Time: 08/23/2020  4:45 PM   Specimen: BLOOD  Result Value Ref Range Status   Specimen Description BLOOD LEFT ANTECUBITAL  Final   Special Requests   Final    BOTTLES DRAWN AEROBIC AND ANAEROBIC Blood Culture adequate volume   Culture   Final    NO GROWTH < 24 HOURS Performed at [facility], [address], [place], [place] [address]    Report Status PENDING  Incomplete  Blood Culture (routine x 2)     Status: None (Preliminary result)   Collection Time: 08/21/2020  4:45 PM   Specimen: BLOOD  Result Value Ref Range Status   Specimen Description BLOOD BLOOD LEFT ARM  Final   Special Requests   Final    BOTTLES DRAWN AEROBIC AND ANAEROBIC Blood Culture adequate volume   Culture   Final    NO GROWTH < 24 HOURS Performed at [facility], [address] [place], [place] [address]    Report Status PENDING  Incomplete  MRSA PCR Screening     Status: None   Collection Time: 20-Sep-2020  8:36 AM   Specimen: Nasal Mucosa; Nasopharyngeal  Result Value Ref Range Status   MRSA by  PCR NEGATIVE NEGATIVE Final    Comment:        The GeneXpert MRSA Assay (FDA approved for NASAL specimens only), is one component of a comprehensive MRSA colonization surveillance program. It is not intended to diagnose MRSA infection nor to guide or monitor treatment for MRSA infections. Performed at [facility], [address], [place], [place] [address]     Lab Basic Metabolic Panel: Recent Labs  Lab 08/10/2020 1614 2020/09/20 0350  NA 134* 135  K 5.9* 5.9*  CL 105 105  CO2 21* 18*  GLUCOSE 153* 140*  BUN 59* 54*  CREATININE 2.40* 2.10*  CALCIUM 8.4* 8.9  MG  --  2.2  PHOS  --  7.2*   Liver Function Tests: Recent Labs  Lab 08/31/2020 1614  AST 10*  ALT 9  ALKPHOS 55  BILITOT 0.6  PROT 6.5  ALBUMIN 3.6   No results for input(s): LIPASE, AMYLASE in the last 168 hours. No results for input(s): AMMONIA in the last 168 hours. CBC: Recent Labs  Lab 08/15/2020 1614 2020-09-20 0350  WBC 12.4* 15.5*  NEUTROABS  10.9*  --   HGB 14.0 16.1*  HCT 45.5 53.1*  MCV 92.7 95.2  PLT 220 244   Cardiac Enzymes: No results for input(s): CKTOTAL, CKMB, CKMBINDEX, TROPONINI in the last 168 hours. Sepsis Labs: Recent Labs  Lab Sep 04, 2020 1614 Sep 04, 2020 1842 08/14/2020 0350  PROCALCITON <0.10  --   --   WBC 12.4*  --  15.5*  LATICACIDVEN 1.8 1.6  --     Procedures/Operations  N/A        Harlon Ditty, AGACNP-BC [facility] Pager: [phone]  [name] 08/14/2020, 8:54 PM

## 2020-09-08 NOTE — ED Notes (Signed)
ICU provider notified "Pt is extremely difficult to arrouse and when she does wake, is not very cognisent. I offered bipap (as per provider) and husband refused."

## 2020-09-08 NOTE — ED Notes (Signed)
Provider notified of 4:10 temperature and that some blood work from the morning labs, are starting to result.

## 2020-09-08 NOTE — Progress Notes (Signed)
Pt's son also allowed at bedside per end of life policy.

## 2020-09-08 NOTE — ED Notes (Signed)
Morning labs sent at this time, as per provider. Lab called and notified provider would like those tested at this time.

## 2020-09-08 NOTE — Progress Notes (Signed)
Follow up - Critical Care Medicine Note  Patient Details:    Regina [name] is an 64 y.o. female.  Lines, Airways, Drains: Urethral Catheter Sam RN Double-lumen (Active)  Indication for Insertion or Continuance of Catheter Therapy based on hourly urine output monitoring and documentation for critical condition (NOT STRICT I&O) 09-21-20 0800  Site Assessment Clean;Intact;Dry 21-Sep-2020 0800  Catheter Maintenance Bag below level of bladder;Catheter secured;Drainage bag/tubing not touching floor;Insertion date on drainage bag;No dependent loops;Seal intact 2020-09-21 0800  Collection Container Standard drainage bag 09-21-20 0800  Securement Method Securing device (Describe) 09/21/2020 0800  Urinary Catheter Interventions (if applicable) Unclamped September 21, 2020 0800  Output (mL) 15 mL September 21, 2020 0926    Anti-infectives:  Anti-infectives (From admission, onward)   Start     Dose/Rate Route Frequency Ordered Stop   09/04/2020 1800  vancomycin (VANCOCIN) IVPB 1000 mg/200 mL premix        1,000 mg 200 mL/hr over 60 Minutes Intravenous  Once 09/04/2020 1652 08/25/2020 2106   08/16/2020 1645  ceFEPIme (MAXIPIME) 2 g in sodium chloride 0.9 % 100 mL IVPB        2 g 200 mL/hr over 30 Minutes Intravenous  Once 08/24/2020 1636 09/06/2020 1723   08/19/2020 1630  aztreonam (AZACTAM) 2 g in sodium chloride 0.9 % 100 mL IVPB  Status:  Discontinued        2 g 200 mL/hr over 30 Minutes Intravenous  Once 08/31/2020 1624 08/13/2020 1636   08/16/2020 1630  metroNIDAZOLE (FLAGYL) IVPB 500 mg        500 mg 100 mL/hr over 60 Minutes Intravenous  Once 08/10/2020 1624 09/06/2020 1929   08/15/2020 1630  vancomycin (VANCOCIN) IVPB 1000 mg/200 mL premix        1,000 mg 200 mL/hr over 60 Minutes Intravenous  Once 08/21/2020 1624 08/16/2020 1813      Microbiology: Results for orders placed or performed during the hospital encounter of 08/10/2020  Blood Culture (routine x 2)     Status: None (Preliminary result)   Collection Time: 08/21/2020  4:45 PM    Specimen: BLOOD  Result Value Ref Range Status   Specimen Description BLOOD LEFT ANTECUBITAL  Final   Special Requests   Final    BOTTLES DRAWN AEROBIC AND ANAEROBIC Blood Culture adequate volume   Culture   Final    NO GROWTH < 24 HOURS Performed at [facility], [address] [place], [place] [address]    Report Status PENDING  Incomplete  Blood Culture (routine x 2)     Status: None (Preliminary result)   Collection Time: 08/21/2020  4:45 PM   Specimen: BLOOD  Result Value Ref Range Status   Specimen Description BLOOD BLOOD LEFT ARM  Final   Special Requests   Final    BOTTLES DRAWN AEROBIC AND ANAEROBIC Blood Culture adequate volume   Culture   Final    NO GROWTH < 24 HOURS Performed at [facility], [address] [place], [place] [address]    Report Status PENDING  Incomplete   Events:  DNR/DNI no escalation of pressors per family  Studies: DG Pelvis 1-2 Views  Result Date: 08/29/2020 CLINICAL DATA:  Fall EXAM: PELVIS - 1-2 VIEW COMPARISON:  05/14/2020 FINDINGS: Negative for pelvic fracture. Advanced degenerative change left hip. Negative right hip Lumbar fusion hardware appears unchanged. Long segment left iliac vein stent unchanged. Foley catheter in the bladder. IMPRESSION: Negative for fracture. Electronically Signed   By: Marlan Palau M.D.  On: Sep 25, 2020 17:41   DG Chest Port 1 View  Result Date: 09-25-20 CLINICAL DATA:  Question sepsis EXAM: PORTABLE CHEST 1 VIEW COMPARISON:  05/14/2020 FINDINGS: Cardiac enlargement with pulmonary artery enlargement. Negative for heart failure edema or effusion. Hypoventilation with decreased lung volume. Negative for pneumonia. IMPRESSION: No active disease. Electronically Signed   By: Marlan Palau M.D.   On: 09/25/2020 17:40   DG FEMUR MIN 2 VIEWS LEFT  Result Date: 09/25/20 CLINICAL DATA:  Lethargy necrotic hip EXAM: LEFT FEMUR 2 VIEWS COMPARISON:  05/14/2020 FINDINGS: Catheter over the low  pelvis. Vascular stent in the left groin. No acute displaced fracture. Sclerosis and lucency within the subarticular left femoral head with mild deformity suggestive of AVN. Advanced degenerative changes. 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 Advanced degenerative changes of the left hip. Electronically Signed   By: Jasmine Pang M.D.   On: 09-25-20 17:24    Consults:    Subjective:    Overnight Issues:   Objective:  Vital signs for last 24 hours: Temp:  [95 F (35 C)-101.2 F (38.4 C)] 99.7 F (37.6 C) (10/26 0810) Pulse Rate:  [30-102] 91 (10/26 0810) Resp:  [12-28] 14 (10/26 0900) BP: (57-128)/(37-105) 74/49 (10/26 0900) SpO2:  [30 %-86 %] 78 % (10/26 0842) FiO2 (%):  [100 %] 100 % (10/26 0842) Weight:  [100.5 kg-105.1 kg] 105.1 kg (10/26 0810)  Hemodynamic parameters for last 24 hours:    Intake/Output from previous day: 10/25 0701 - 10/26 0700 In: 500 [I.V.:500] Out: 265 [Urine:265]  Intake/Output this shift: Total I/O In: 1895.4 [I.V.:1895.4] Out: 15 [Urine:15]  Vent settings for last 24 hours: FiO2 (%):  [100 %] 100 %  Physical Exam:  General: alert, no respiratory distress and toxic in appearance, mottled, and mildy cyanotic   LUNGS: poor aeration with rhochi HEART: frequent extrassytole, diminished pulses ABD:  Generalized tenderness diminished BS EXT:  1+edema lower NEURO: stuporous wax/wane, does respond to painful stim, which simply includes turning  Assessment/Plan:   Assessment/Plan  Altered mental status with acute encephalopathy Query accidental opiate overdose Acute chronic respiratory failure with hypoxia Supportive care Noninvasive ventilation for now, patient's husband ambivalent about it She is DNR/DNI Has received Narcan with no significant improvement Avoid narcotics and respiratory depressants  Acute renal failure Metabolic acidosis IV fluids Avoid nephrotoxins Sodium bicarb  drip  Shock, query septic IV fluid support Vasopressors as needed to maintain MAP 60, however no escalation of pressors per husband  COPD without exacerbation Bronchodilators  ID Rule out potential urinary tract infection Trend procalcitonin Panculture Empiric vancomycin and cefepime   Patient will be admitted to SDU/ICU.  Prognosis overall is guarded due to multiple comorbidities and baseline bedbound status.  Discussed with patient's husband reassessment of goals of care.   Patient seen and examined face to face fashion Images of the day and lab values reviewed  Critical Care Total Time 35  Elyn Aquas 08/28/2020  *Care during the described time interval was provided by me and/or other providers on the critical care team.  I have reviewed this patient's available data, including medical history, events of note, physical examination and test results as part of my evaluation.

## 2020-09-08 NOTE — Progress Notes (Signed)
After Dr. Earlie Server has spoken to husband, he still does not want escalation of care and pt remains a DNR. No new orders received. Levo gtt remains at 10 mcg, BP 69/48. O2 sats are no longer picking up. HR in the 70s. Husband at bedside.

## 2020-09-08 NOTE — Progress Notes (Signed)
Death Visit. Husband and children at bedside grieving. Regina [name] was described as the best mother, loved others before herself. Family was struggling due to [facility] being the anchor of the family. They had not talked about funeral arrangements. Husband was processing and will contact [facility] with funeral home.    08/24/2020 2100  Clinical Encounter Type  Visited With Family  Visit Type Death  Spiritual Encounters  Spiritual Needs Emotional;Grief support;Prayer

## 2020-09-08 NOTE — ED Notes (Signed)
Provider notified of pts oxygen dropping into the 60s% and a BP of 80/45. As per provider, no new intervention at this time needed. Pt is on cardiac, bp and pulse ox monitoring.

## 2020-09-08 NOTE — ED Notes (Signed)
Provider notified of pts skin color. Lab called and stated recollect on blood is needed. Lab to come as soon as possible to get blood. ICU provider notified.

## 2020-09-08 NOTE — ED Notes (Signed)
Provider notified of current temperature of 100.48f

## 2020-09-08 NOTE — ED Notes (Signed)
Provider notified of pts temperature of 100.53f

## 2020-09-08 NOTE — Progress Notes (Signed)
Pt arrived on unit at this time. Pt is very lethargic but does respond to voice. Pt is cyanotic and mottles. O2 sats reading in the 20s, pt on 100% nonrebreather and 15L HFNC. Husband refusing bipap. Pt is hypotensive but husband is also refusing to titrate levo gtt over . Dr. Earlie Server is aware of pt condition and was notified that she has arrived to unit. Pt is DNR.

## 2020-09-08 NOTE — ED Notes (Signed)
Lab at bedside

## 2020-09-08 NOTE — Progress Notes (Signed)
Priest at bedside per husband request.

## 2020-09-08 NOTE — ED Notes (Signed)
Provider notified of skin break down to the back.

## 2020-09-08 NOTE — TOC Initial Note (Signed)
Transition of Care [facility]) - Initial/Assessment Note    Patient Details  Name: Regina [name] MRN: [id number] Date of Birth: 22-Jan-1956  [facility]) CM/SW Contact:    Marina Goodell Phone Number: [phone] 2020/09/21, 12:29 PM  Clinical Narrative:                  Patient presents to [facility] ED due to lethargy and low BP.  Patient is bed bound at baselines and is followed by [facility] pain clinic.  CSW spoke with patient's [place], Molly Maduro (Spouse) [phone], to inquire about POA.  Mr. Goedde stated the patient has a POA which was created with the Chaplain last time the patient was admitted to [facility].  CSW was unable to find POA paperwork under documents in patient's chart.  CSW asked Mr. Casimiro Needle if the POA paperwork was done at [facility] he stated "no it was done there [[facility]] with the chaplain."  CSW asked Mr. Bickham about long-term care plans, whether the plan was for the patient to return home.  Mr. Wix stated he did not think the patient was going to return home.     Barriers to Discharge: Continued Medical Work up   Patient Goals and CMS Choice        Expected Discharge Plan and Services   In-house Referral: Clinical Social Work     Living arrangements for the past 2 months: Single Family Home                                      Prior Living Arrangements/Services Living arrangements for the past 2 months: Single Family Home Lives with:: Spouse Patient language and need for interpreter reviewed:: Yes        Need for Family Participation in Patient Care: Yes (Comment) Care giver support system in place?: Yes (comment) Current home services: Other (comment) (Pt followed by [facility] pain clinic) Criminal Activity/Legal Involvement Pertinent to Current Situation/Hospitalization: No - Comment as needed  Activities of Daily Living      Permission Sought/Granted Permission sought to share information with : Facility Social worker    Share Information with NAME: Moani Weipert [phone]           Emotional Assessment Appearance:: Appears stated age Attitude/Demeanor/Rapport: Unable to Assess Affect (typically observed): Unable to Assess   Alcohol / Substance Use: Not Applicable Psych Involvement: No (comment)  Admission diagnosis:  Fall [W19.XXXA] Acute respiratory failure with hypoxia (HCC) [J96.01] Fall, initial encounter [W19.XXXA] Opiate overdose, accidental or unintentional, initial encounter (HCC) [T40.601A] Acute renal failure, unspecified acute renal failure type [facility]) [N17.9] Patient Active Problem List   Diagnosis Date Noted  . Acute on chronic systolic CHF (congestive heart failure), NYHA class 3 (HCC)   . Left hip pain   . CHF (congestive heart failure) (HCC)   . SOB (shortness of breath)   . Acute on chronic respiratory failure with hypoxia (HCC) 05/12/2020  . Hypotension   . Acute on chronic systolic congestive heart failure (HCC)   . Goals of care, counseling/discussion   . Palliative care by specialist   . Sepsis secondary to UTI (HCC) 04/24/2020  . Sepsis (HCC) 04/24/2020  . Pressure injury of skin 10/23/2019  . COVID-19 virus infection 10/19/2019  . Hyponatremia 10/19/2019  . Hypovolemic shock (HCC) 07/22/2019  . Acute respiratory failure with hypoxemia (HCC) 12/04/2018  . Acute respiratory failure with  hypoxia (HCC) 06/02/2018  . Diarrhea of presumed infectious origin 02/17/2016  . Right acute serous otitis media 02/17/2016  . COPD (chronic obstructive pulmonary disease) (HCC) 12/23/2015  . Heart failure with preserved ejection fraction (HCC) 12/23/2015  . DDD (degenerative disc disease) 09/25/2013  . Narrowing of intervertebral disc space 09/25/2013  . OSA (obstructive sleep apnea) 07/25/2013  . Deep vein thrombosis (DVT) (HCC) 06/25/2013  . Erythema 06/18/2013  . Left leg pain 06/18/2013  . Mediastinal lymphadenopathy 06/18/2013  . DVT, lower extremity  (HCC) 06/14/2013  . Pulmonary hypertension (HCC) 06/14/2013  . Asthma 09/21/2010  . Chronic pain 09/21/2010  . Hypertension, benign 09/21/2010  . Hypothyroidism 09/21/2010  . Venous stasis 09/21/2010   PCP:  Cain Sieve, MD Pharmacy:   [address] [place], [place] - [address] [place] [address] Phone: [phone] Fax: [phone]  [facility] [address] - [place], [place] - [address] [place] [address] Phone: [phone] Fax: [phone]     Social Determinants of Health (SDOH) Interventions    Readmission Risk Interventions Readmission Risk Prevention Plan 10/21/2019  Transportation Screening Complete  Palliative Care Screening Not Applicable  Medication Review (RN Care Manager) Complete  Some recent data might be hidden

## 2020-09-08 NOTE — Progress Notes (Signed)
I have spoken with pt's husband. He is asking if there is anything else we can do without going up on the levo and putting her on a ventilator. He is wanting to try the bipap again. RT and MD notified and pt placed on bipap at this time. Pt's sats remain low in the 60s.

## 2020-09-08 NOTE — ED Notes (Signed)
Pts temperature of 101.1 reported to provider. RN recommended IV tylenol to provider.

## 2020-09-08 NOTE — ED Notes (Signed)
Pt noted to have significant break skin break down to the buttock and back. Chux placed under pt. Pt able to tell me her name and that she is in a hospital.

## 2020-09-08 NOTE — Progress Notes (Signed)
Pt's O2 sats dropping to 40% on 100% bipap. HR remains in the 70s. BP 78/49 on 10 mcg levophed. Husband is still at bedside and states "I am not ready to lose her". He is aware that pt is on highest bipap settings, continues to refuse intubation and titrating levo over .

## 2020-09-08 DEATH — deceased

## 2020-12-08 IMAGING — DX DG CHEST 1V PORT
1 series · 1 of 1 positions shown · non-contrast
Comparison: Chest x-ray and chest CT dated 06/02/2018

CLINICAL DATA: Shortness of breath.

EXAM:
PORTABLE CHEST 1 VIEW

[chest ap]
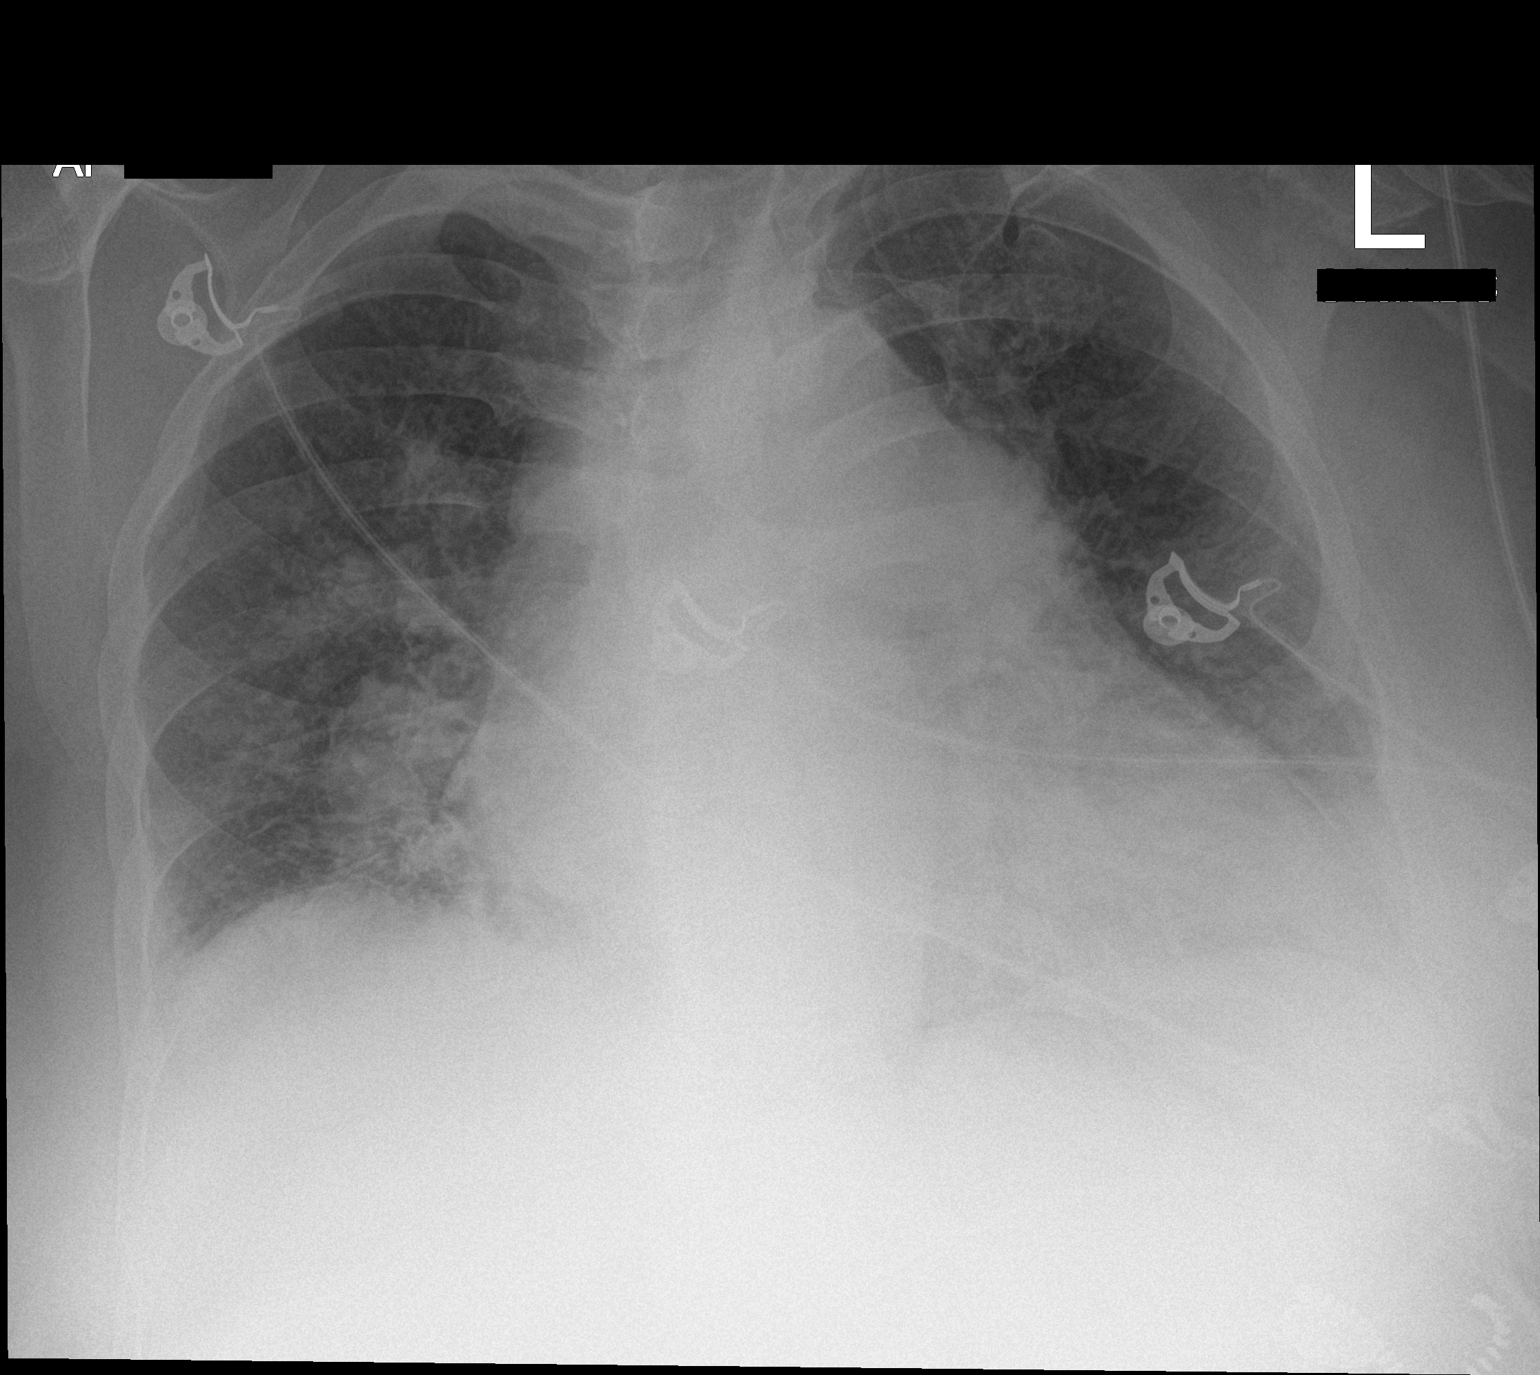

[1 of 1 positions shown; findings below may reference images not displayed]

FINDINGS: There is chronic cardiomegaly with chronic enlargement of the
pulmonary arteries. No discrete infiltrates or effusions. No acute
bone abnormality.
IMPRESSION: Chronic cardiomegaly with chronic marked enlargement of the
pulmonary arteries.

## 2022-05-17 IMAGING — CT CT ANGIO CHEST
2 of 6 series · 17 of 46 positions shown · IV contrast (APPLIED)
Comparison: Chest CT 04/24/2020.

CLINICAL DATA: 63-year-old female with history of recent hospital
admission for urosepsis. Urinary retention. Low oxygen saturation
despite being on 4 L of oxygen. Evaluate for pulmonary embolism.

EXAM:
CT ANGIOGRAPHY CHEST WITH CONTRAST
TECHNIQUE: Multidetector CT imaging of the chest was performed using the
standard protocol during bolus administration of intravenous
contrast. Multiplanar CT image reconstructions and MIPs were
obtained to evaluate the vascular anatomy.
CONTRAST:  75mL OMNIPAQUE IOHEXOL 350 MG/ML SOLN

[Series 5: thins · axial · 0.68mm/px · z∈[-724,-476]mm · 14 of 272 slices shown]
[im 12/272  lung]
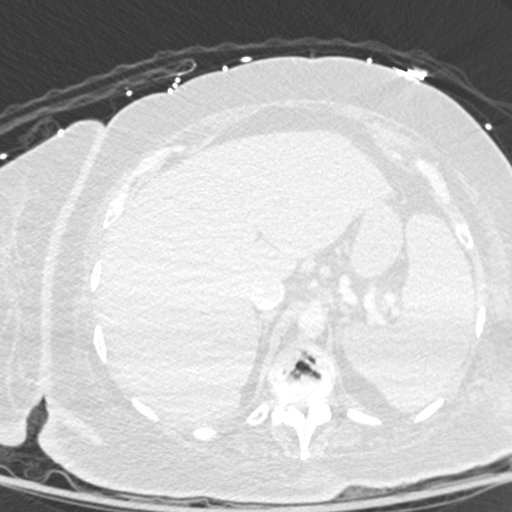
[im 36/272  soft-tissue]
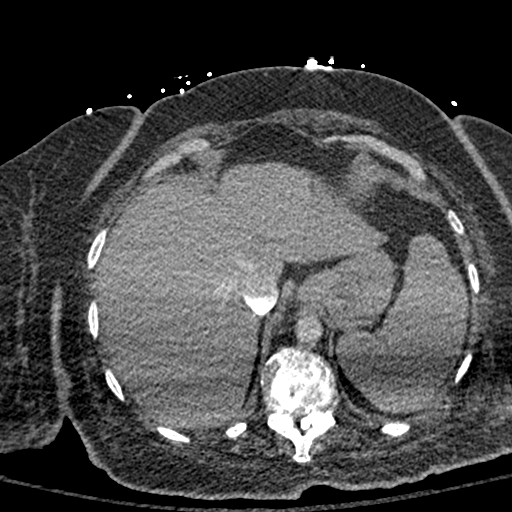
[im 48/272  lung]
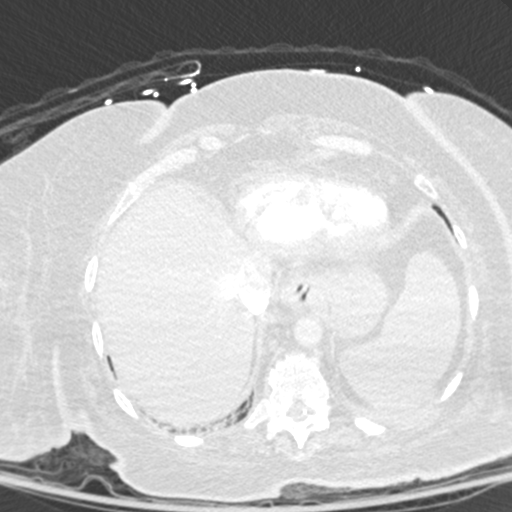
[im 71/272  soft-tissue]
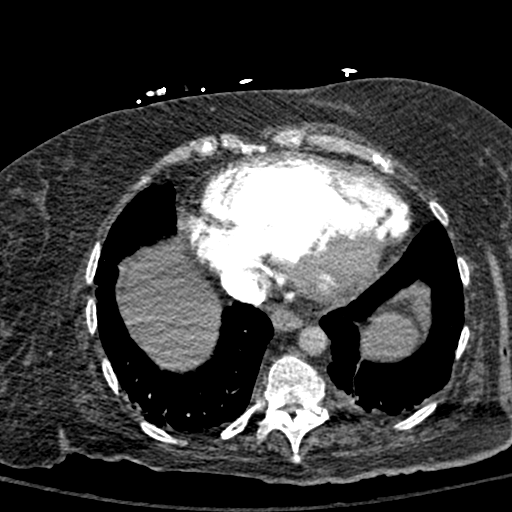
[im 95/272  lung]
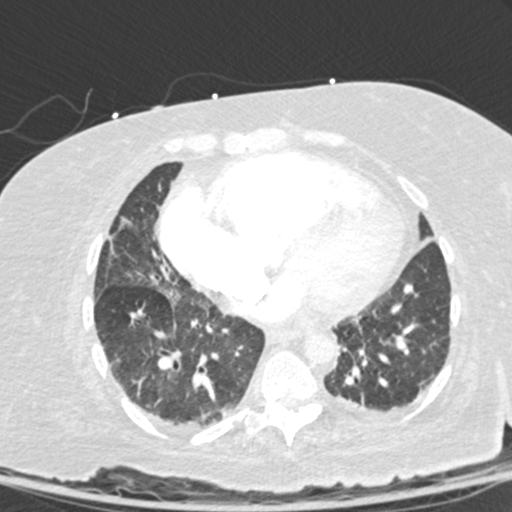
[im 107/272  soft-tissue]
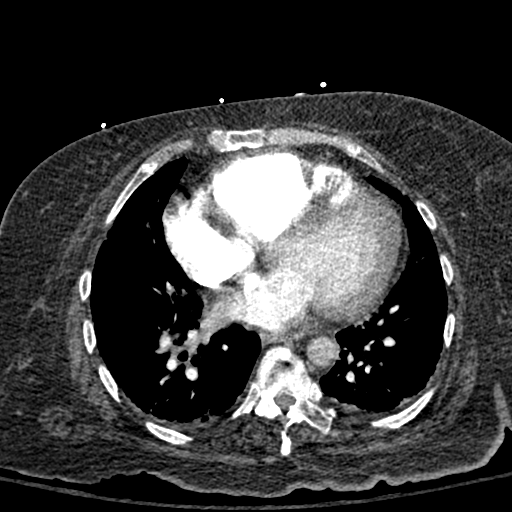
[im 130/272  lung]
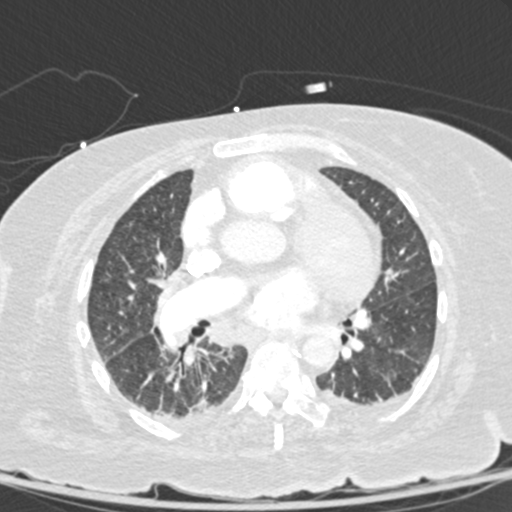
[im 142/272  soft-tissue]
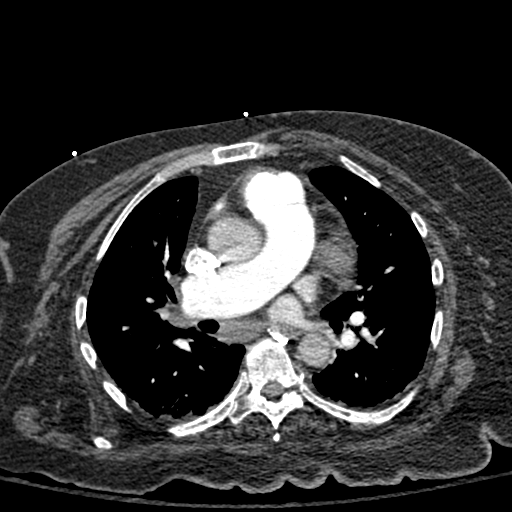
[im 165/272  lung]
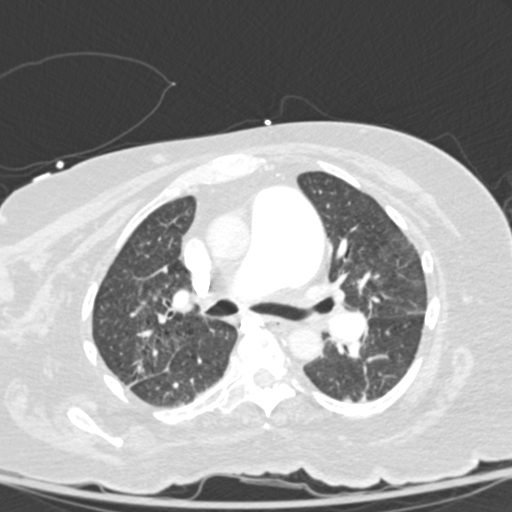
[im 177/272  soft-tissue]
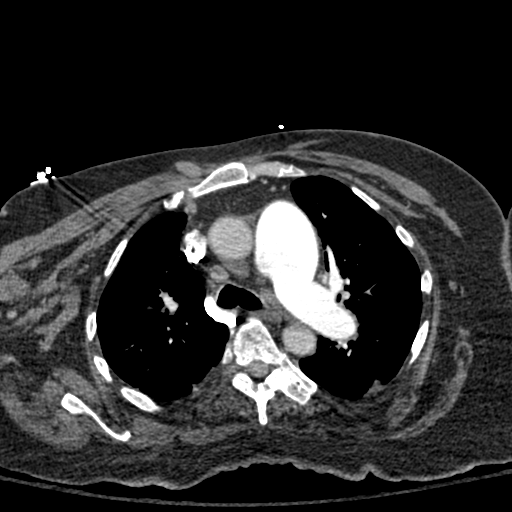
[im 201/272  lung]
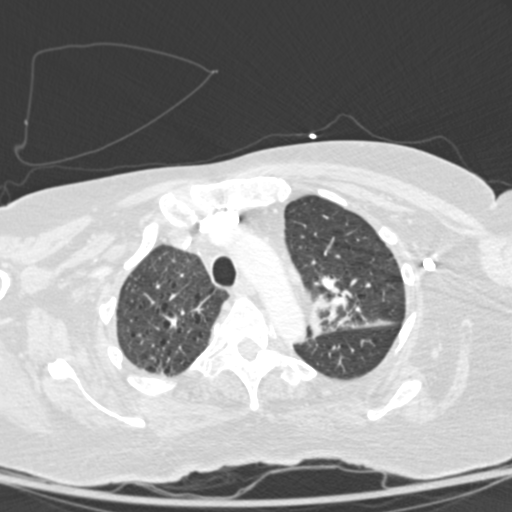
[im 224/272  soft-tissue]
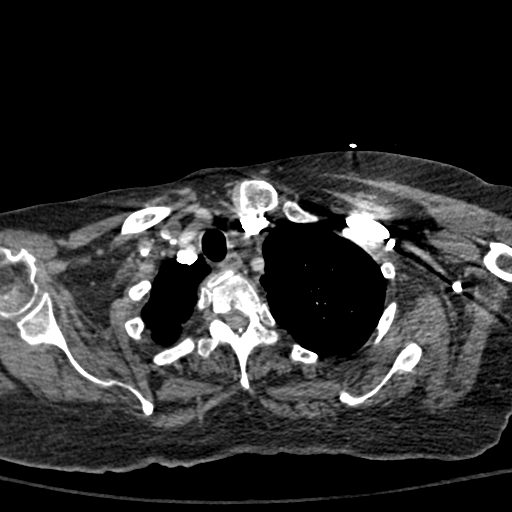
[im 236/272  lung]
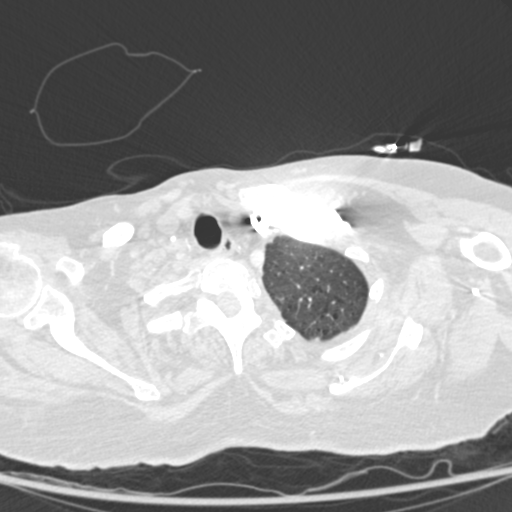
[im 260/272  soft-tissue]
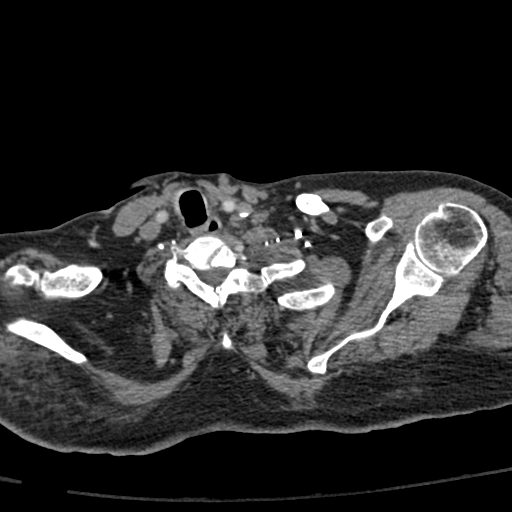

[Series 7: coronal mpr · coronal · 0.56mm/px · 3 of 88 slices shown]
[im 22/88  soft-tissue]
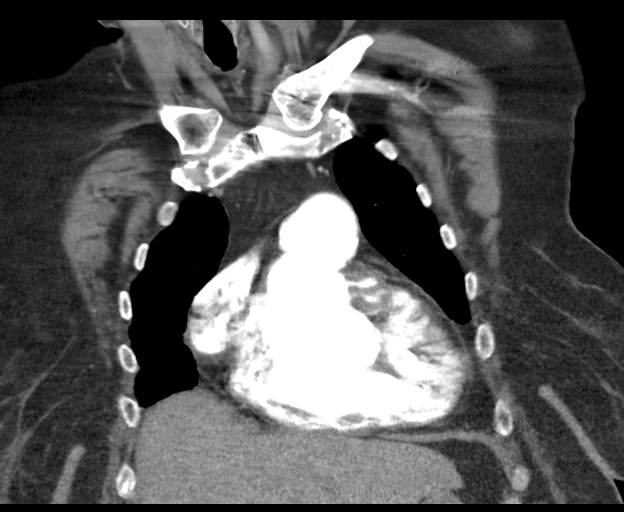
[im 44/88  soft-tissue]
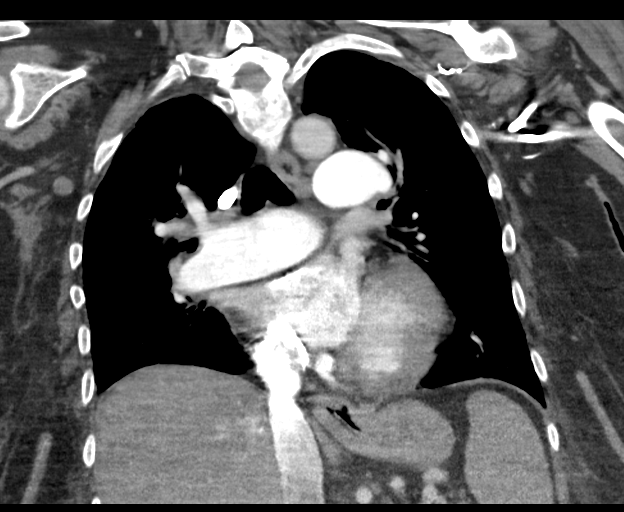
[im 66/88  soft-tissue]
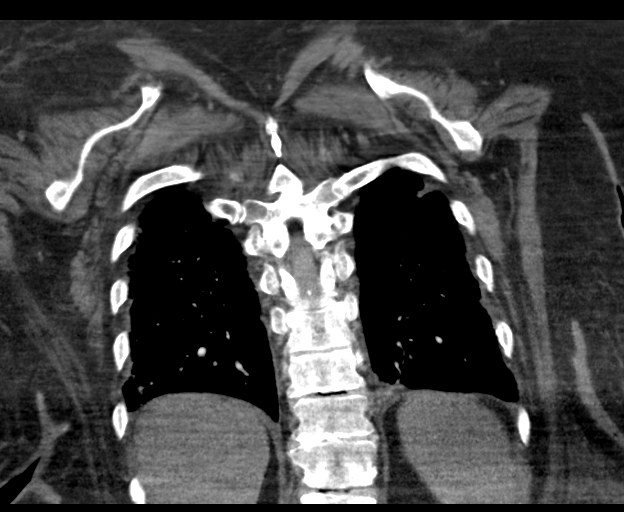

[17 of 46 positions shown; findings below may reference images not displayed]

FINDINGS: Cardiovascular: No filling defects within the pulmonary arterial
tree to suggest pulmonary embolism. Severe dilatation of the
pulmonic trunk (4.8 cm in diameter). Heart size is enlarged with
severe right ventricular dilatation. There is no significant
pericardial fluid, thickening or pericardial calcification. There is
aortic atherosclerosis, as well as atherosclerosis of the great
vessels of the mediastinum and the coronary arteries, including
calcified atherosclerotic plaque in the left anterior descending
coronary artery.

Mediastinum/Nodes: No pathologically enlarged mediastinal or hilar
lymph nodes. Esophagus is unremarkable in appearance. No axillary
lymphadenopathy.

Lungs/Pleura: No acute consolidative airspace disease. No pleural
effusions. No suspicious appearing pulmonary nodules or masses are
noted. Mild diffuse bronchial wall thickening with mild
centrilobular emphysema.

Upper Abdomen: Unremarkable.

Musculoskeletal: There are no aggressive appearing lytic or blastic
lesions noted in the visualized portions of the skeleton.

Review of the MIP images confirms the above findings.
IMPRESSION: 1. No evidence of pulmonary embolism.
2. Cardiomegaly with severe right ventricular dilatation and severe
dilatation of the pulmonic trunk. Findings are again suggestive of
pulmonary arterial hypertension, likely with chronic right heart
strain.
3. Mild diffuse bronchial wall thickening with mild centrilobular
emphysema; imaging findings suggestive of underlying COPD.
4. Aortic atherosclerosis, in addition to left anterior descending
coronary artery disease. Please note that although the presence of
coronary artery calcium documents the presence of coronary artery
disease, the severity of this disease and any potential stenosis
cannot be assessed on this non-gated CT examination. Assessment for
potential risk factor modification, dietary therapy or pharmacologic
therapy may be warranted, if clinically indicated.

Aortic Atherosclerosis (BXXMP-2JO.O) and Emphysema (BXXMP-T56.0).
